# Patient Record
Sex: Male | Born: 1945 | Race: White | Hispanic: No | Marital: Married | Smoking: Former smoker
Health system: Southern US, Community
[De-identification: ages and names within clinical notes are randomized; demographics above are authoritative.]

## PROBLEM LIST (undated history)

## (undated) DIAGNOSIS — E785 Hyperlipidemia, unspecified: Secondary | ICD-10-CM

## (undated) DIAGNOSIS — M6530 Trigger finger, unspecified finger: Secondary | ICD-10-CM

## (undated) DIAGNOSIS — E875 Hyperkalemia: Secondary | ICD-10-CM

## (undated) DIAGNOSIS — M1990 Unspecified osteoarthritis, unspecified site: Secondary | ICD-10-CM

## (undated) DIAGNOSIS — M109 Gout, unspecified: Secondary | ICD-10-CM

## (undated) DIAGNOSIS — N186 End stage renal disease: Secondary | ICD-10-CM

## (undated) DIAGNOSIS — I2510 Atherosclerotic heart disease of native coronary artery without angina pectoris: Secondary | ICD-10-CM

## (undated) DIAGNOSIS — C44222 Squamous cell carcinoma of skin of right ear and external auricular canal: Secondary | ICD-10-CM

## (undated) DIAGNOSIS — Z860100 Personal history of colon polyps, unspecified: Secondary | ICD-10-CM

## (undated) DIAGNOSIS — Z8614 Personal history of Methicillin resistant Staphylococcus aureus infection: Secondary | ICD-10-CM

## (undated) DIAGNOSIS — Z87442 Personal history of urinary calculi: Secondary | ICD-10-CM

## (undated) DIAGNOSIS — D4959 Neoplasm of unspecified behavior of other genitourinary organ: Secondary | ICD-10-CM

## (undated) DIAGNOSIS — I219 Acute myocardial infarction, unspecified: Secondary | ICD-10-CM

## (undated) DIAGNOSIS — T7840XA Allergy, unspecified, initial encounter: Secondary | ICD-10-CM

## (undated) DIAGNOSIS — K2210 Ulcer of esophagus without bleeding: Secondary | ICD-10-CM

## (undated) DIAGNOSIS — I10 Essential (primary) hypertension: Secondary | ICD-10-CM

## (undated) DIAGNOSIS — R0600 Dyspnea, unspecified: Secondary | ICD-10-CM

## (undated) DIAGNOSIS — E119 Type 2 diabetes mellitus without complications: Secondary | ICD-10-CM

## (undated) DIAGNOSIS — Z992 Dependence on renal dialysis: Secondary | ICD-10-CM

## (undated) DIAGNOSIS — N189 Chronic kidney disease, unspecified: Secondary | ICD-10-CM

## (undated) DIAGNOSIS — I739 Peripheral vascular disease, unspecified: Secondary | ICD-10-CM

## (undated) DIAGNOSIS — C439 Malignant melanoma of skin, unspecified: Secondary | ICD-10-CM

## (undated) DIAGNOSIS — K219 Gastro-esophageal reflux disease without esophagitis: Secondary | ICD-10-CM

## (undated) HISTORY — DX: Personal history of colon polyps, unspecified: Z86.0100

## (undated) HISTORY — DX: Ulcer of esophagus without bleeding: K22.10

## (undated) HISTORY — DX: Allergy, unspecified, initial encounter: T78.40XA

## (undated) HISTORY — DX: Personal history of colonic polyps: Z86.010

## (undated) HISTORY — DX: Trigger finger, unspecified finger: M65.30

## (undated) HISTORY — DX: Hyperkalemia: E87.5

## (undated) HISTORY — PX: EYE SURGERY: SHX253

## (undated) HISTORY — DX: Hyperlipidemia, unspecified: E78.5

## (undated) HISTORY — DX: Malignant melanoma of skin, unspecified: C43.9

## (undated) HISTORY — PX: OTHER SURGICAL HISTORY: SHX169

## (undated) HISTORY — PX: CATARACT EXTRACTION: SUR2

## (undated) HISTORY — DX: Type 2 diabetes mellitus without complications: E11.9

## (undated) HISTORY — DX: Squamous cell carcinoma of skin of right ear and external auricular canal: C44.222

## (undated) HISTORY — DX: Gout, unspecified: M10.9

## (undated) HISTORY — PX: RETINAL DETACHMENT SURGERY: SHX105

## (undated) HISTORY — DX: Personal history of Methicillin resistant Staphylococcus aureus infection: Z86.14

## (undated) HISTORY — DX: End stage renal disease: N18.6

## (undated) HISTORY — DX: Atherosclerotic heart disease of native coronary artery without angina pectoris: I25.10

## (undated) HISTORY — DX: Dependence on renal dialysis: Z99.2

## (undated) HISTORY — DX: Essential (primary) hypertension: I10

## (undated) HISTORY — DX: Neoplasm of unspecified behavior of other genitourinary organ: D49.59

## (undated) SURGERY — A/V SHUNT INTERVENTION
Anesthesia: Moderate Sedation | Laterality: Left

---

## 1984-04-18 DIAGNOSIS — I219 Acute myocardial infarction, unspecified: Secondary | ICD-10-CM

## 1984-04-18 HISTORY — DX: Acute myocardial infarction, unspecified: I21.9

## 1994-04-18 HISTORY — PX: CORONARY ARTERY BYPASS GRAFT: SHX141

## 1995-06-09 DIAGNOSIS — I25810 Atherosclerosis of coronary artery bypass graft(s) without angina pectoris: Secondary | ICD-10-CM | POA: Insufficient documentation

## 1995-06-09 DIAGNOSIS — I10 Essential (primary) hypertension: Secondary | ICD-10-CM | POA: Insufficient documentation

## 2006-08-06 DIAGNOSIS — J309 Allergic rhinitis, unspecified: Secondary | ICD-10-CM | POA: Insufficient documentation

## 2006-10-14 ENCOUNTER — Emergency Department: Payer: Self-pay | Admitting: Emergency Medicine

## 2007-02-14 ENCOUNTER — Ambulatory Visit: Payer: Self-pay | Admitting: Specialist

## 2007-10-11 ENCOUNTER — Ambulatory Visit: Payer: Self-pay | Admitting: Family Medicine

## 2008-12-29 ENCOUNTER — Ambulatory Visit: Payer: Self-pay | Admitting: Gastroenterology

## 2009-02-02 ENCOUNTER — Ambulatory Visit: Payer: Self-pay | Admitting: Family Medicine

## 2009-08-26 DIAGNOSIS — Z87891 Personal history of nicotine dependence: Secondary | ICD-10-CM | POA: Insufficient documentation

## 2010-06-22 ENCOUNTER — Emergency Department: Payer: Self-pay | Admitting: Unknown Physician Specialty

## 2010-06-29 ENCOUNTER — Emergency Department: Payer: Self-pay | Admitting: Emergency Medicine

## 2013-01-01 LAB — HEMOGLOBIN A1C: Hgb A1c MFr Bld: 7.5 % — AB (ref 4.0–6.0)

## 2013-01-01 LAB — LIPID PANEL
CHOLESTEROL: 163 mg/dL (ref 0–200)
HDL: 36 mg/dL (ref 35–70)
LDL Cholesterol: 86 mg/dL
Triglycerides: 206 mg/dL — AB (ref 40–160)

## 2013-11-20 LAB — BASIC METABOLIC PANEL
BUN: 14 mg/dL (ref 4–21)
Creatinine: 1.1 mg/dL (ref 0.6–1.3)
Glucose: 146 mg/dL
POTASSIUM: 5.9 mmol/L — AB (ref 3.4–5.3)
Sodium: 141 mmol/L (ref 137–147)

## 2013-11-20 LAB — PSA: PSA: 1.2

## 2013-11-20 LAB — LIPID PANEL
Cholesterol: 192 mg/dL (ref 0–200)
HDL: 44 mg/dL (ref 35–70)
LDL Cholesterol: 98 mg/dL
TRIGLYCERIDES: 251 mg/dL — AB (ref 40–160)

## 2013-11-20 LAB — HEPATIC FUNCTION PANEL: ALT: 28 U/L (ref 10–40)

## 2013-11-20 LAB — HEMOGLOBIN A1C: HEMOGLOBIN A1C: 7 % — AB (ref 4.0–6.0)

## 2014-02-21 LAB — HEMOGLOBIN A1C: HEMOGLOBIN A1C: 7.2 % — AB (ref 4.0–6.0)

## 2014-03-03 ENCOUNTER — Ambulatory Visit: Payer: Self-pay | Admitting: Gastroenterology

## 2014-03-03 LAB — HM COLONOSCOPY

## 2014-04-01 ENCOUNTER — Observation Stay: Payer: Self-pay | Admitting: Internal Medicine

## 2014-04-01 LAB — URINALYSIS, COMPLETE
BACTERIA: NONE SEEN
Bilirubin,UR: NEGATIVE
Blood: NEGATIVE
Glucose,UR: 50 mg/dL (ref 0–75)
KETONE: NEGATIVE
Leukocyte Esterase: NEGATIVE
Nitrite: NEGATIVE
Ph: 6 (ref 4.5–8.0)
SPECIFIC GRAVITY: 1.008 (ref 1.003–1.030)
SQUAMOUS EPITHELIAL: NONE SEEN

## 2014-04-01 LAB — COMPREHENSIVE METABOLIC PANEL
ALK PHOS: 106 U/L
AST: 21 U/L (ref 15–37)
Albumin: 2.9 g/dL — ABNORMAL LOW (ref 3.4–5.0)
Anion Gap: 6 — ABNORMAL LOW (ref 7–16)
BILIRUBIN TOTAL: 0.4 mg/dL (ref 0.2–1.0)
BUN: 19 mg/dL — ABNORMAL HIGH (ref 7–18)
CALCIUM: 10.1 mg/dL (ref 8.5–10.1)
CO2: 24 mmol/L (ref 21–32)
Chloride: 106 mmol/L (ref 98–107)
Creatinine: 1.17 mg/dL (ref 0.60–1.30)
EGFR (Non-African Amer.): >60
GLUCOSE: 123 mg/dL — AB (ref 65–99)
Osmolality: 276 (ref 275–301)
Potassium: 4.5 mmol/L (ref 3.5–5.1)
SGPT (ALT): 30 U/L
SODIUM: 136 mmol/L (ref 136–145)
TOTAL PROTEIN: 6.8 g/dL (ref 6.4–8.2)

## 2014-04-01 LAB — CBC
HCT: 42.9 % (ref 40.0–52.0)
HGB: 14.2 g/dL (ref 13.0–18.0)
MCH: 31.6 pg (ref 26.0–34.0)
MCHC: 33.1 g/dL (ref 32.0–36.0)
MCV: 95 fL (ref 80–100)
Platelet: 388 10*3/uL (ref 150–440)
RBC: 4.5 10*6/uL (ref 4.40–5.90)
RDW: 13 % (ref 11.5–14.5)
WBC: 15.5 10*3/uL — ABNORMAL HIGH (ref 3.8–10.6)

## 2014-04-01 LAB — PROTIME-INR
INR: 1
PROTHROMBIN TIME: 12.8 s (ref 11.5–14.7)

## 2014-04-01 LAB — TROPONIN I: Troponin-I: <0.02 ng/mL

## 2014-04-01 LAB — LIPASE, BLOOD: LIPASE: 170 U/L (ref 73–393)

## 2014-04-02 LAB — CBC WITH DIFFERENTIAL/PLATELET
Basophil #: 0.1 10*3/uL (ref 0.0–0.1)
Basophil %: 0.8 %
Eosinophil #: 0.7 10*3/uL (ref 0.0–0.7)
Eosinophil %: 6.3 %
HCT: 36.2 % — ABNORMAL LOW (ref 40.0–52.0)
HGB: 12 g/dL — ABNORMAL LOW (ref 13.0–18.0)
LYMPHS ABS: 2.9 10*3/uL (ref 1.0–3.6)
LYMPHS PCT: 24.2 %
MCH: 31.6 pg (ref 26.0–34.0)
MCHC: 33.2 g/dL (ref 32.0–36.0)
MCV: 95 fL (ref 80–100)
MONO ABS: 1.2 x10 3/mm — AB (ref 0.2–1.0)
MONOS PCT: 10.1 %
Neutrophil #: 7 10*3/uL — ABNORMAL HIGH (ref 1.4–6.5)
Neutrophil %: 58.6 %
PLATELETS: 315 10*3/uL (ref 150–440)
RBC: 3.8 10*6/uL — ABNORMAL LOW (ref 4.40–5.90)
RDW: 13 % (ref 11.5–14.5)
WBC: 11.9 10*3/uL — AB (ref 3.8–10.6)

## 2014-04-02 LAB — BASIC METABOLIC PANEL
Anion Gap: 3 — ABNORMAL LOW (ref 7–16)
BUN: 18 mg/dL (ref 7–18)
CALCIUM: 8.3 mg/dL — AB (ref 8.5–10.1)
CHLORIDE: 109 mmol/L — AB (ref 98–107)
CO2: 26 mmol/L (ref 21–32)
CREATININE: 1.48 mg/dL — AB (ref 0.60–1.30)
EGFR (African American): >60
EGFR (Non-African Amer.): 50 — ABNORMAL LOW
GLUCOSE: 214 mg/dL — AB (ref 65–99)
OSMOLALITY: 284 (ref 275–301)
Potassium: 4.2 mmol/L (ref 3.5–5.1)
SODIUM: 138 mmol/L (ref 136–145)

## 2014-04-03 LAB — CBC WITH DIFFERENTIAL/PLATELET
BASOS ABS: 0.1 10*3/uL (ref 0.0–0.1)
Basophil %: 0.9 %
EOS PCT: 7.7 %
Eosinophil #: 0.7 10*3/uL (ref 0.0–0.7)
HCT: 36.3 % — AB (ref 40.0–52.0)
HGB: 12 g/dL — ABNORMAL LOW (ref 13.0–18.0)
LYMPHS PCT: 24.4 %
Lymphocyte #: 2.3 10*3/uL (ref 1.0–3.6)
MCH: 31.3 pg (ref 26.0–34.0)
MCHC: 33 g/dL (ref 32.0–36.0)
MCV: 95 fL (ref 80–100)
Monocyte #: 0.9 x10 3/mm (ref 0.2–1.0)
Monocyte %: 9.7 %
NEUTROS ABS: 5.4 10*3/uL (ref 1.4–6.5)
NEUTROS PCT: 57.3 %
Platelet: 271 10*3/uL (ref 150–440)
RBC: 3.81 10*6/uL — ABNORMAL LOW (ref 4.40–5.90)
RDW: 12.9 % (ref 11.5–14.5)
WBC: 9.4 10*3/uL (ref 3.8–10.6)

## 2014-04-03 LAB — BASIC METABOLIC PANEL
Anion Gap: 5 — ABNORMAL LOW (ref 7–16)
BUN: 14 mg/dL (ref 7–18)
CALCIUM: 8.2 mg/dL — AB (ref 8.5–10.1)
CREATININE: 1.13 mg/dL (ref 0.60–1.30)
Chloride: 110 mmol/L — ABNORMAL HIGH (ref 98–107)
Co2: 27 mmol/L (ref 21–32)
EGFR (Non-African Amer.): >60
Glucose: 89 mg/dL (ref 65–99)
Osmolality: 283 (ref 275–301)
POTASSIUM: 4.3 mmol/L (ref 3.5–5.1)
SODIUM: 142 mmol/L (ref 136–145)

## 2014-04-03 LAB — PROTIME-INR
INR: 1
Prothrombin Time: 13.1 secs (ref 11.5–14.7)

## 2014-04-06 LAB — CULTURE, BLOOD (SINGLE)

## 2014-05-06 DIAGNOSIS — L814 Other melanin hyperpigmentation: Secondary | ICD-10-CM | POA: Diagnosis not present

## 2014-05-06 DIAGNOSIS — D485 Neoplasm of uncertain behavior of skin: Secondary | ICD-10-CM | POA: Diagnosis not present

## 2014-05-06 DIAGNOSIS — Z1283 Encounter for screening for malignant neoplasm of skin: Secondary | ICD-10-CM | POA: Diagnosis not present

## 2014-05-06 DIAGNOSIS — Z08 Encounter for follow-up examination after completed treatment for malignant neoplasm: Secondary | ICD-10-CM | POA: Diagnosis not present

## 2014-05-06 DIAGNOSIS — L728 Other follicular cysts of the skin and subcutaneous tissue: Secondary | ICD-10-CM | POA: Diagnosis not present

## 2014-05-06 DIAGNOSIS — L570 Actinic keratosis: Secondary | ICD-10-CM | POA: Diagnosis not present

## 2014-05-06 DIAGNOSIS — Z85828 Personal history of other malignant neoplasm of skin: Secondary | ICD-10-CM | POA: Diagnosis not present

## 2014-05-08 DIAGNOSIS — K259 Gastric ulcer, unspecified as acute or chronic, without hemorrhage or perforation: Secondary | ICD-10-CM | POA: Diagnosis not present

## 2014-05-08 DIAGNOSIS — Z8719 Personal history of other diseases of the digestive system: Secondary | ICD-10-CM | POA: Diagnosis not present

## 2014-05-26 ENCOUNTER — Ambulatory Visit: Payer: Self-pay | Admitting: Gastroenterology

## 2014-05-26 DIAGNOSIS — I2510 Atherosclerotic heart disease of native coronary artery without angina pectoris: Secondary | ICD-10-CM | POA: Diagnosis not present

## 2014-05-26 DIAGNOSIS — K259 Gastric ulcer, unspecified as acute or chronic, without hemorrhage or perforation: Secondary | ICD-10-CM | POA: Diagnosis not present

## 2014-05-26 DIAGNOSIS — K2980 Duodenitis without bleeding: Secondary | ICD-10-CM | POA: Diagnosis not present

## 2014-05-26 DIAGNOSIS — Z79899 Other long term (current) drug therapy: Secondary | ICD-10-CM | POA: Diagnosis not present

## 2014-05-26 DIAGNOSIS — M109 Gout, unspecified: Secondary | ICD-10-CM | POA: Diagnosis not present

## 2014-05-26 DIAGNOSIS — I10 Essential (primary) hypertension: Secondary | ICD-10-CM | POA: Diagnosis not present

## 2014-05-26 DIAGNOSIS — K2270 Barrett's esophagus without dysplasia: Secondary | ICD-10-CM | POA: Diagnosis not present

## 2014-05-26 DIAGNOSIS — I252 Old myocardial infarction: Secondary | ICD-10-CM | POA: Diagnosis not present

## 2014-05-26 DIAGNOSIS — E119 Type 2 diabetes mellitus without complications: Secondary | ICD-10-CM | POA: Diagnosis not present

## 2014-05-26 DIAGNOSIS — K210 Gastro-esophageal reflux disease with esophagitis: Secondary | ICD-10-CM | POA: Diagnosis not present

## 2014-05-26 DIAGNOSIS — R859 Unspecified abnormal finding in specimens from digestive organs and abdominal cavity: Secondary | ICD-10-CM | POA: Diagnosis not present

## 2014-05-26 DIAGNOSIS — Z9841 Cataract extraction status, right eye: Secondary | ICD-10-CM | POA: Diagnosis not present

## 2014-05-26 DIAGNOSIS — Z881 Allergy status to other antibiotic agents status: Secondary | ICD-10-CM | POA: Diagnosis not present

## 2014-05-26 DIAGNOSIS — Z9889 Other specified postprocedural states: Secondary | ICD-10-CM | POA: Diagnosis not present

## 2014-05-26 DIAGNOSIS — Z86010 Personal history of colonic polyps: Secondary | ICD-10-CM | POA: Diagnosis not present

## 2014-05-26 DIAGNOSIS — K224 Dyskinesia of esophagus: Secondary | ICD-10-CM | POA: Diagnosis not present

## 2014-05-26 DIAGNOSIS — Z951 Presence of aortocoronary bypass graft: Secondary | ICD-10-CM | POA: Diagnosis not present

## 2014-05-26 DIAGNOSIS — Z9842 Cataract extraction status, left eye: Secondary | ICD-10-CM | POA: Diagnosis not present

## 2014-05-26 DIAGNOSIS — F17200 Nicotine dependence, unspecified, uncomplicated: Secondary | ICD-10-CM | POA: Diagnosis not present

## 2014-08-09 NOTE — Consult Note (Signed)
Chief Complaint:  Subjective/Chief Complaint seen for melena.  no nausea or emesis, no abdominal pain.  one bm this am, not black like previous.   VITAL SIGNS/ANCILLARY NOTES: **Vital Signs.:   17-Dec-15 06:06  Temperature Temperature (F) 97.8    08:19  Vital Signs Type Pre Medication  Temperature Source oral  Pulse Pulse 50  Respirations Respirations 18  Systolic BP Systolic BP A999333  Diastolic BP (mmHg) Diastolic BP (mmHg) 72  Mean BP 105  Pulse Ox % Pulse Ox % 94  Pulse Ox Activity Level  At rest  Oxygen Delivery Room Air/ 21 %   Brief Assessment:  Cardiac Regular   Respiratory clear BS   Gastrointestinal details normal Soft  Nondistended  No masses palpable  Bowel sounds normal  No rebound tenderness  mild tenderness to palpation in the epigastrum.   Lab Results: Routine Chem:  15-Dec-15 14:50   BUN  19  16-Dec-15 05:01   BUN 18  Routine Coag:  17-Dec-15 08:17   Prothrombin 13.1  INR 1.0 (INR reference interval applies to patients on anticoagulant therapy. A single INR therapeutic range for coumarins is not optimal for all indications; however, the suggested range for most indications is 2.0 - 3.0. Exceptions to the INR Reference Range may include: Prosthetic heart valves, acute myocardial infarction, prevention of myocardial infarction, and combinations of aspirin and anticoagulant. The need for a higher or lower target INR must be assessed individually. Reference: The Pharmacology and Management of the Vitamin K  antagonists: the seventh ACCP Conference on Antithrombotic and Thrombolytic Therapy. [id number] Sept:126 (3suppl): X2190819. A HCT value >55% may artifactually increase the PT.  In one study,  the increase was an average of 25%. Reference:  "Effect on Routine and Special Coagulation Testing Values of Citrate Anticoagulant Adjustment in Patients with High HCT Values." American Journal of Clinical Pathology 2006;126:400-405.)  Routine Hem:  15-Dec-15  14:50   Hemoglobin (CBC) 14.2  Platelet Count (CBC) 388 (Result(s) reported on 01 Apr 2014 at 03:20PM.)  16-Dec-15 05:01   Hemoglobin (CBC)  12.0  Platelet Count (CBC) 315  17-Dec-15 08:17   WBC (CBC) 9.4  RBC (CBC)  3.81  Hemoglobin (CBC)  12.0  Hematocrit (CBC)  36.3  Platelet Count (CBC) 271  MCV 95  MCH 31.3  MCHC 33.0  RDW 12.9  Neutrophil % 57.3  Lymphocyte % 24.4  Monocyte % 9.7  Eosinophil % 7.7  Basophil % 0.9  Neutrophil # 5.4  Lymphocyte # 2.3  Monocyte # 0.9  Eosinophil # 0.7  Basophil # 0.1 (Result(s) reported on 03 Apr 2014 at 08:43AM.)   Radiology Results: CT:    15-Dec-15 20:07, CT Abdomen and Pelvis With Contrast  CT Abdomen and Pelvis With Contrast   REASON FOR EXAM:    (1) abd pain; (2) pel paijn  COMMENTS:       PROCEDURE: CT  - CT ABDOMEN / PELVIS  W  - Apr 01 2014  8:07PM     CLINICAL DATA:  Acute right lower quadrant abdominal pain.    EXAM:  CT ABDOMEN AND PELVIS WITH CONTRAST    TECHNIQUE:  Multidetector CT imaging of the abdomen and pelvis was performed  using the standard protocol following bolus administration of  intravenous contrast.  CONTRAST:  100 mL Omnipaque 300 intravenously.    COMPARISON:  CT scan of February 14, 2007.    FINDINGS:  Visualized lung bases appear normal. No significant osseous  abnormality is noted.    No gallstones  are noted. The liver, spleen and pancreas appear  normal. Adrenal glands appear normal. Nonobstructive calculus is  noted in midpole collecting system of left kidney. Small  nonobstructive calculus is noted in midpole collecting system of  right kidney. 2.4 cm cyst is seen in upper pole of left kidney. No  hydronephrosis or renal obstruction is noted. Atherosclerotic  calcifications of abdominal aorta are noted without aneurysm  formation.    There does appear to be moderate wall thickening involving the the  first and second portions of the duodenum with surrounding  inflammatory fluid  suggesting duodenitis or peptic ulcer disease.  Stable small fat containing ventral hernia is noted and epigastric  region. There is no evidence of bowel obstruction or ileus. The  appendix appears normal. Urinary bladder appears normal. Mild  prostatic enlargement is noted. No significant adenopathyis noted.     IMPRESSION:  Stable small fat containing ventral hernia is seen in epigastric  region.    Bilateral nonobstructive nephrolithiasis is noted.  Moderate wall thickening is seen involving the first and second  portions of the duodenum withsurrounding inflammatory changes  suggesting duodenitis or peptic ulcer disease.      Electronically Signed    By: Sabino Dick M.D.    On: 04/01/2014 20:46         Verified By: Marveen Reeks, M.D.,   Assessment/Plan:  Assessment/Plan:  Assessment 1) melena-not recurrent, some epigastric discomfort much improved from admission. Positive h/o frequent nsaid use. 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 Benign ureteral  tumors, status post resection, diabetes, hypertension.   Plan 1) egd today.  I have discussed the risks benefits and complications of egd to include not limited to bleeding infection perforation and sedation and he wishes to proceed.   furhter recs to follow.   Electronic Signatures: Loistine Simas (MD)  (Signed 17-Dec-15 13:04)  Authored: Chief Complaint, VITAL SIGNS/ANCILLARY NOTES, Brief Assessment, Lab Results, Radiology Results, Assessment/Plan   Last Updated: 17-Dec-15 13:04 by Loistine Simas (MD)

## 2014-08-09 NOTE — H&P (Signed)
PATIENT NAME:  Thomas [name], Thomas [name] MR#:  [id number] DATE OF BIRTH:  [birth year]  DATE OF ADMISSION:  04/01/2014  PRIMARY CARE PHYSICIAN: Kirstie Peri. Caryn Section, MD   REQUESTING PHYSICIAN: Sheryl L. Benjaman Lobe, MD    CHIEF COMPLAINT: Abdominal pain.  HISTORY OF PRESENT ILLNESS: The patient is a 69 year old male with a known history of MI with coronary disease status post CABG about 18 years ago, hypertension, is being admitted for melena. Patient noticed solid black stool this morning He started feeling some lower abdominal squeezing since yesterday, which would come and go, it was more of a nagging pain about 5/10 but when it does come it would shoot in the back and was very uncomfortable. The pain would be about 5/10 in severity. He was feeling a little bit weak today and decided to come to the Emergency Department. While in the ED, he underwent CT scan of the abdomen and pelvis, which showed possible acute duodenitis/peptic ulcer disease and is being admitted for further evaluation and management.   The patient seems pretty comfortable right now but does seem get frequent shooting pain in the back from the lower abdomen. He also reports some pain around the epigastric region.   PAST MEDICAL HISTORY:  1.  Coronary artery disease, status post CABG about 18 years ago.  2.  Ureteral tumor, status post resection x 2.  3.  Diabetes mellitus.  4.  Hypertension.   ALLERGIES: TO CIPROFLOXACIN.   SOCIAL HISTORY: He smokes about 1 pack of cigarettes daily for last 50 years. Occasional alcohol. He used to be a Secondary school teacher.   FAMILY HISTORY: Positive for heart disease in the father. Mother had Alzheimer disease.   MEDICATIONS AT HOME:  1.  Aspirin 81 mg p.o. daily.  2.  Atenolol 25 mg p.o. daily.  3.  Atorvastatin 20 mg p.o. at bedtime.  4.  Glipizide 5 mg p.o. daily.  5.  HCTZ/lisinopril 25/20 mg 1 tablet p.o. daily.  6.  Metformin 500 mg p.o. b.i.d. daily.   REVIEW OF SYSTEMS:  CONSTITUTIONAL: No fever.  Positive fatigue and weakness.  EYES: No blurred or double vision.  EARS, NOSE, THROAT: No tinnitus or ear pain.  RESPIRATORY: No cough, wheezing, hemoptysis.  CARDIOVASCULAR: No chest pain, orthopnea, edema.  GASTROINTESTINAL: No nausea, vomiting, or diarrhea. Positive for abdominal pain and melena.  GENITOURINARY: No dysuria or hematuria.  ENDOCRINE: No polyuria or nocturia.   HEMATOLOGY: Positive for anemia. No easy bruising or bleeding.  SKIN: No rash or lesions.  MUSCULOSKELETAL: No arthritis or muscle cramp.  NEUROLOGIC: No tingling, numbness, weakness.  PSYCHIATRIC: No history of anxiety or depression.   PHYSICAL EXAMINATION:  VITAL SIGNS: Temperature 98.2, heart rate 66 per minute, respirations 20 per minute, blood pressure 155/69. He is saturating 95% on room air.  GENERAL: The patient is a 69 year old male lying in the bed comfortably without any acute distress.  EYES: Pupils equal, round, and reactive to light and accommodation. No scleral icterus. Extraocular muscles intact.  HEENT: Head atraumatic, normocephalic. Oropharynx and nasopharynx clear.  NECK: Supple. No jugular venous distention. No thyroid enlargement. LUNGS: Clear auscultation bilaterally. No wheezing, rales, rhonchi, or crepitation.  CARDIOVASCULAR: S1, S2 normal. No murmurs.  ABDOMEN: Soft. He does have some epigastric tenderness and minimal discomfort in the lower abdominal region on palpation. No guarding or rigidity. No organomegaly appreciated. Bowel sounds present.   NEUROLOGIC: Cranial nerves II-XII intact. Muscle strength 5/5 in all extremities. Sensation intact.  PSYCHIATRIC: The patient is alert and oriented  x 3.  SKIN: No obvious rash, lesion, ulcer.  MUSCULOSKELETAL: No joint effusion or tenderness.   LABORATORY DATA: Normal BMP. Normal liver function tests. Normal first set of troponin. CBC within normal limits except white count of 15.5. Normal coagulation panel. Urinalysis is negative.    IMAGING: CT scan of the abdomen and pelvis with contrast in the ER showed bilateral nonobstructive nephrolithiasis. Ventral hernia containing small fat in epigastric region. Possible duodenitis or peptic ulcer disease.   IMPRESSION AND PLAN:  1.  Melena: Likely due to acute duodenitis/peptic ulcer disease. We will start him on intravenous Protonix at this time twice a day and consult gastroenterology. He may need luminal evaluation. At this time is hemodynamically stable. We will hold aspirin. Add Bentyl for his bowel problems. Hold off narcotics unless really needed. 2.  Diabetes mellitus: He reports his recent hemoglobin A1c which was checked and was 7. We will hold his metformin. Continue glipizide. Start him on sliding scale insulin. 3.  Coronary disease:  Hold his aspirin. Continue rest of cardiac medication.  4.  Hyperlipidemia: We will continue statin.   CODE STATUS: Full code.   TOTAL TIME TAKING CARE OF THIS PATIENT: 40 minutes.    ____________________________ Lucina Mellow. Manuella Ghazi, MD vss:bm D: 04/01/2014 22:06:51 ET T: 04/01/2014 23:12:29 ET JOB#: [id number]  cc: Zriyah Kopplin S. Manuella Ghazi, MD, <Dictator> Kirstie Peri. Caryn Section, MD Remer Macho MD ELECTRONICALLY SIGNED 04/05/2014 17:08

## 2014-08-09 NOTE — Consult Note (Signed)
PATIENT NAME:  Thomas [name], Thomas [name] MR#:  [id number] DATE OF BIRTH:  08/28/1945  DATE OF CONSULTATION:  04/01/2014  REQUESTING PHYSICIAN:  Vipul S. Manuella Ghazi, MD CONSULTING PHYSICIAN:  Theodore Demark, NP  REASON FOR CONSULTATION: GI consult ordered by Dr. Manuella Ghazi for evaluation of melena.   HISTORY OF PRESENT ILLNESS: I appreciate consult for this 69 year old Caucasian man admitted with abdominal pain for evaluation of melena. Reports onset of generalized abdominal pain described as an achiness on Monday. Went to work back the next day; however, that day the pain changed. It became sharp, radiating in waves from the left upper quadrant around to the other side and to the back. States he had a large, black tarry stool yesterday, none since. Rarely uses NSAIDs and has not had any problem with her GERD-like symptoms he reports. No history of EGD. Had colonoscopy with Dr. Allen Norris last month with the removal of 5 polyps. Denies all further GI-related complaints. Hemoglobin last was 12, normal platelets, normal white blood cell count, PT-INR normal. He is hemodynamically stable. He had a CT scan with some duodenitis versus peptic ulcer disease, some nonobstructing nephrolithiasis. He is on b.i.d. pantoprazole. He had a small regular breakfast this morning.   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 Benign ureteral  tumors, status post resection, diabetes, hypertension.   ALLERGIES: CIPROFLOXACIN.   SOCIAL HISTORY: Smokes a pack a day for 50 years. Occasional alcohol.   FAMILY HISTORY: Brother had pancreatitis and alcoholic liver disease. Otherwise, no family history of colorectal cancer, colon polyps, ulcers. Significant for heart disease in his father. Mother with dementia.   HOME MEDICATIONS: ASA 81 mg p.o. daily, atenolol 20 mg p.o. daily, atorvastatin 20 mg p.o. daily, glipizide 5 mg p.o. daily, hydrochlorothiazide, lisinopril 25/20 mg p.o. daily, metformin 500 mg p.o. daily.    REVIEW OF SYSTEMS: Ten systems reviewed, unremarkable other than what is noted above.   VITAL SIGNS: Most recent, temperature 98.4, pulse 51, respiratory rate 20, blood pressure 156/72, SaO2 of 91% on room air.   LABORATORY DATA: Most recent, glucose 214, BUN 18, creatinine 1.48, sodium 138, potassium 4.2, GFR 50, calcium 8.3, lipase 170. Liver panel normal except decreased albumin at 2.9, troponin 0.02. WBC 11.9, hemoglobin 12, hematocrit 36.2, platelet count 315,000. PT 12.8, INR 1.0. Blood cultures negative.  IMAGING: CT with some duodenitis, a ventral abdominal hernia, and kidney stones.   PHYSICAL EXAMINATION:  GENERAL: Pleasant, well-appearing man in bed in no acute distress.  HEENT: Normocephalic, atraumatic. Conjunctivae pink. Sclerae are clear.  NECK: Supple. No thyromegaly or lymphadenopathy.  CHEST: Respirations eupneic. Lungs clear.  CARDIAC: S1, S2, RRR. No MRG. No appreciable edema.  ABDOMEN: Bowel sounds x 4, nondistended, nontender, soft. No guarding, rigidity, peritoneal signs, or other abnormalities.  SKIN: Warm, dry, pink. No erythema, lesion, or rash.  MUSCULOSKELETAL: MAEW x 4. Strength 5/5. Sensation intact.  NEUROLOGICAL: Alert, oriented x 3. Cranial nerves II through XII intact. Speech clear. No facial droop.  PSYCHIATRIC: Pleasant, calm, cooperative, logical train of thought.   IMPRESSION AND PLAN: Abdominal pain, melena, abnormal duodenal appearance on CT. Agree with following hemoglobin, b.i.d. proton pump inhibitor. Would transfuse p.r.n. I do recommend esophagogastroduodenoscopy if clinically feasible. Indications, risks, benefits including, but not limited to, bleeding, infection perforation, difficulty with sedation were discussed with the patient. He is agreeable to the procedure. Will discuss with Dr. Gustavo Lah further.   Thank you very much for this consult. These services were provided by  Stephens November, MSN, [facility], in collaboration with Loistine Simas,  MD, with whom I have discussed this patient in full.    ____________________________ Theodore Demark, NP chl:LT D: 04/02/2014 17:26:00 ET T: 04/02/2014 17:59:00 ET JOB#: [id number]  cc: Theodore Demark, NP, <Dictator> IXL SIGNED 04/04/2014 17:18

## 2014-08-09 NOTE — Consult Note (Signed)
Chief Complaint:  Subjective/Chief Complaint Please see full GI consult and brief consult note.  Patietn admitted with epigastric pain and episode of black stool yesterday.  No repeat of bm since then.  abdominal pain much improved.  Abdominal ct showing duodenitis, possible DU.  Patient taking naprosyn/aleve at home relatively regularly. Was given a regular diet earliet today.    Will change diet ot clears for now, plann egd for tomorrow, sooner if clinically indicated.  Patietn is hemodynamically stable.  I have discussed the risks benefits and complications of egd to include not limited to bleeding infection perforation and sedation and he wishes to proceed. continue iv ppi bid as you are currently.  Further recs to follow.   VITAL SIGNS/ANCILLARY NOTES: **Vital Signs.:   16-Dec-15 13:20  Vital Signs Type Routine  Temperature Temperature (F) 98.4  Celsius 36.8  Temperature Source oral  Pulse Pulse 51  Respirations Respirations 20  Systolic BP Systolic BP A999333  Diastolic BP (mmHg) Diastolic BP (mmHg) 72  Mean BP 100  Pulse Ox % Pulse Ox % 91  Pulse Ox Activity Level  At rest  Oxygen Delivery Room Air/ 21 %   Brief Assessment:  GEN well developed   Cardiac Regular   Respiratory clear BS   Gastrointestinal details normal Soft  Nontender  Nondistended  Bowel sounds normal   Lab Results:  Routine Chem:  15-Dec-15 14:50   BUN  19  16-Dec-15 05:01   BUN 18  Routine Hem:  15-Dec-15 14:50   Hemoglobin (CBC) 14.2  Platelet Count (CBC) 388 (Result(s) reported on 01 Apr 2014 at 03:20PM.)  16-Dec-15 05:01   Hemoglobin (CBC)  12.0  Platelet Count (CBC) 315   Radiology Results:  CT:    15-Dec-15 20:07, CT Abdomen and Pelvis With Contrast  CT Abdomen and Pelvis With Contrast   REASON FOR EXAM:    (1) abd pain; (2) pel paijn  COMMENTS:       PROCEDURE: CT  - CT ABDOMEN / PELVIS  W  - Apr 01 2014  8:07PM     CLINICAL DATA:  Acute right lower quadrant abdominal  pain.    EXAM:  CT ABDOMEN AND PELVIS WITH CONTRAST    TECHNIQUE:  Multidetector CT imaging of the abdomen and pelvis was performed  using the standard protocol following bolus administration of  intravenous contrast.  CONTRAST:  100 mL Omnipaque 300 intravenously.    COMPARISON:  CT scan of February 14, 2007.    FINDINGS:  Visualized lung bases appear normal. No significant osseous  abnormality is noted.    No gallstones are noted. The liver, spleen and pancreas appear  normal. Adrenal glands appear normal. Nonobstructive calculus is  noted in midpole collecting system of left kidney. Small  nonobstructive calculus is noted in midpole collecting system of  right kidney. 2.4 cm cyst is seen in upper pole of left kidney. No  hydronephrosis or renal obstruction is noted. Atherosclerotic  calcifications of abdominal aorta are noted without aneurysm  formation.    There does appear to be moderate wall thickening involving the the  first and second portions of the duodenum with surrounding  inflammatory fluid suggesting duodenitis or peptic ulcer disease.  Stable small fat containing ventral hernia is noted and epigastric  region. There is no evidence of bowel obstruction or ileus. The  appendix appears normal. Urinary bladder appears normal. Mild  prostatic enlargement is noted. No significant adenopathyis noted.     IMPRESSION:  Stable small fat  containing ventral hernia is seen in epigastric  region.    Bilateral nonobstructive nephrolithiasis is noted.  Moderate wall thickening is seen involving the first and second  portions of the duodenum withsurrounding inflammatory changes  suggesting duodenitis or peptic ulcer disease.      Electronically Signed    By: Sabino Dick M.D.    On: 04/01/2014 20:46         Verified By: Marveen Reeks, M.D.,   Assessment/Plan:  Assessment/Plan:  Assessment as above.   Electronic Signatures: Loistine Simas (MD)  (Signed  16-Dec-15 20:42)  Authored: Chief Complaint, VITAL SIGNS/ANCILLARY NOTES, Brief Assessment, Lab Results, Radiology Results, Assessment/Plan   Last Updated: 16-Dec-15 20:42 by Loistine Simas (MD)

## 2014-08-09 NOTE — Consult Note (Signed)
Brief Consult Note: Diagnosis: abdominal pain.   Patient was seen by consultant.   Consult note dictated.   Comments: Appreciate consult for 69 y/o caucsian man admitted with abdominal pain for eval of melena. Reports onset of generalized abdominal pain described as an achiness on Mon- went to work & back the next day, however pain changed & became sharp- radiated in waves from the LUQ around to the otherside & to the back. States he had a large black tarry stool yesterday. None since.  States he rarely uses NSAIDs and hasn't had any problems wtih GERD-like sx. No history of EGD. Had colonoscopy last month with 5 polyps removed by Dr Allen Norris. denies further GI complaints. Hgb last 12, with normal platelets & wbc.pt/inr normal. hemodynamically stable.  CT with duodenitis v. PUD and some nonobstructing nephrolithiasis. On bid Pantoprazole. Had a small regular breakfast this am Impresion and plan. Abdominal pain, melena, abnormal duodenal appearance on CT. Agree with following hgb, bid PPI. Would transfuse prn. Do recommend EGD as clinically feasible. Inidcations, benefits, risks- including but not limited to bleeding, infection, perforation, difficulty with sedation, were discussed with the patient and he is agreeable to the procedure. Will d/w Dr Gustavo Lah further.  Electronic Signatures: Stephens November H (NP)  (Signed 16-Dec-15 15:09)  Authored: Brief Consult Note   Last Updated: 16-Dec-15 15:09 by Theodore Demark (NP)

## 2014-08-11 LAB — SURGICAL PATHOLOGY

## 2014-08-13 NOTE — Discharge Summary (Signed)
PATIENT NAME:  Thomas [name], Thomas [name] MR#:  [id number] DATE OF BIRTH:  [birth year]  DATE OF ADMISSION:  04/01/2014 DATE OF DISCHARGE:  04/04/2014  ADMITTING DIAGNOSIS: Melena.   DISCHARGE DIAGNOSES:  1.  Melena, status post esophagogastroduodenoscopy on 04/03/2014. LA grade B erosive esophagitis was noted, esophageal motility disorder, erosive gastritis and duodenitis.  2.  Status post hemorrhagic anemia.  3.  Leukocytosis.  4.  Diabetes mellitus.  5.  Hypertension.   DISCHARGE CONDITION: Stable.   DISCHARGE MEDICATIONS: The patient is to continue atenolol 25 mg p.o. daily, hydrochlorothiazide with lisinopril 25/20 one tablet daily, glipizide 5 mg p.o. daily, metformin 500 mg p.o. twice daily, atorvastatin unknown dose once daily, dicyclomine 10 mg every 8 hours ago, sucralfate 1 gram 4 times daily, pantoprazole 40 mg twice daily, iron gluconate 240 mg twice daily. The patient was advised not to take aspirin.   HOME OXYGEN: None.   DIET: Two gram salt, low fat, low cholesterol, mechanical soft. The patient was advised to continue soft, liquidy diet for the first 1 week.   ACTIVITY LIMITATIONS: As tolerated.   FOLLOWUP APPOINTMENTS:  With Dr. Caryn Section in 2 days after discharge, Dr. Gustavo Lah in 1 month after discharge.   CONSULTANTS: Care management, social work, Dr. Gustavo Lah, nurse practitioner, [facility].   RADIOLOGIC STUDIES: CT scan of abdomen and pelvis with contrast 04/01/2014 revealing stable small fat-containing ventral hernia seen in the epigastric region, bilateral nonobstructive nephrolithiasis noted, moderate wall thickening was seen involving the first and second portions of duodenum with surrounding inflammatory changes suggesting duodenitis or peptic ulcer disease.   HISTORY OF PRESENT ILLNESS: This 69 year old male with past medical history significant for history of diabetes mellitus, history of coronary artery disease, coronary artery bypass grafting who was on aspirin  therapy at home presented to the hospital with complaints of black-looking stool. Please refer to Dr. Trena Platt admission note on 04/01/2014. On arrival to the hospital, the patient's temperature was 98.2, pulse was 66, respiration rate was 20, blood pressure 155/69, saturation was 95% on room air. Physical exam was unremarkable. The patient's lab data done on arrival to Emergency Room revealed mild elevation of BUN of 19, glucose was 123, otherwise, BMP was unremarkable. The patient's lipase level was normal at 170. Liver enzymes: Albumin level was 2.9, otherwise, unremarkable. Troponin was less than 0.02. White blood cell count was elevated to 15.5, hemoglobin was 14.2, platelet count was 388. Antibody screen was negative, pro time was 12.8 and INR was 1.0. Blood cultures taken on 04/01/2014 showed no growth. Urinalysis was unremarkable. The patient was admitted to the hospital for further evaluation.  He underwent CT scan of his abdomen and was initiated on   DICTATION ENDS HERE    ____________________________ Theodoro Grist, MD rv:AT D: 04/04/2014 18:36:04 ET T: 04/04/2014 23:30:41 ET JOB#: [id number]  cc: Theodoro Grist, MD, <Dictator> Kirstie Peri. Caryn Section, MD Lollie Sails, MD Davide Risdon Ether Griffins MD ELECTRONICALLY SIGNED 04/21/2014 21:17

## 2014-08-13 NOTE — Discharge Summary (Signed)
PATIENT NAME:  Thomas [name], Thomas [name] MR#:  [id number] DATE OF BIRTH:  Feb 20, 1946  DATE OF ADMISSION:  04/01/2014 DATE OF DISCHARGE:  04/04/2014  ADDENDUM      Unfortunately my dictation was cutoff short so I am going to continue.   The patient was admitted to the hospital for further evaluation. He was started on a proton pump inhibitor IV and consultation with gastroenterologist was obtained. Gastroenterologist saw patient in consultation and recommended EGD. EGD was performed on 04/03/2014 and revealed as mentioned above Z line irregularity consistent with LA grade B erosive esophagitis, esophageal dysmotility disorder, erosive gastritis, which was biopsied, and erosive duodenitis. Dr. Gustavo Lah recommended to use Protonix 40 mg twice daily dose for 6 weeks, also after that to use Protonix at 40 mg once daily dose, use sucralfate tablets 1 gram 4 times daily for 1 month and then twice daily and return in GI clinic in approximately 1 month, to repeat upper endoscopy in 7 weeks to check healing.   The patient was initiated on a clear-liquid diet on the 04/03/2014, which was advanced to full liquid diet and soft diet on 04/04/2014. He was able to tolerate diet well, and he is going to be discharged home today. On the day of discharge, temperature is 98.3, pulse 50, respiratory rate was 18, blood pressure 146/72, saturation was 94% to 95% on room air at rest. The patient is to follow up with Dr. Gustavo Lah in the next 1 month after discharge.   The patient was followed in regards to a bleeding. His hemoglobin was followed while he was in the hospital. As mentioned above, his hemoglobin level was 14.2 on the day of admission. It drifted down to 12.0 on 16th of December and remained stable until his day of discharge. The patient was advised to continue iron supplementation and follow his hemoglobin level as outpatient. No transfusions were recommended.   In regards to leukocytosis, the patient's white blood cell  count was elevated on the day of admission to 15.5: however, it improved to 9.4 by 04/03/2014. It was unclear why the patient had elevated white blood cell count but it was felt it was most likely stress related demarcation. In regards to history of diabetes mellitus and hypertension, the patient is to continue his outpatient management. No changes were made. He is being discharged in stable condition with the above-mentioned medications and followup.   TIME SPENT: 40 minutes   ____________________________ Theodoro Grist, MD rv:AT D: 04/04/2014 18:40:04 ET T: 04/05/2014 00:20:42 ET JOB#: [id number]  cc: Theodoro Grist, MD, <Dictator> Tanae Petrosky MD ELECTRONICALLY SIGNED 04/21/2014 21:24

## 2014-09-26 ENCOUNTER — Other Ambulatory Visit: Payer: Self-pay | Admitting: *Deleted

## 2014-09-26 ENCOUNTER — Telehealth: Payer: Self-pay | Admitting: Family Medicine

## 2014-09-26 ENCOUNTER — Other Ambulatory Visit: Payer: Self-pay | Admitting: Family Medicine

## 2014-09-26 DIAGNOSIS — E1139 Type 2 diabetes mellitus with other diabetic ophthalmic complication: Secondary | ICD-10-CM | POA: Insufficient documentation

## 2014-09-26 DIAGNOSIS — E1129 Type 2 diabetes mellitus with other diabetic kidney complication: Secondary | ICD-10-CM

## 2014-09-26 DIAGNOSIS — E119 Type 2 diabetes mellitus without complications: Secondary | ICD-10-CM

## 2014-09-26 MED ORDER — METFORMIN HCL 500 MG PO TABS: 1000.0000 mg | ORAL_TABLET | Freq: Every day | ORAL | Status: DC

## 2014-09-26 MED ORDER — METFORMIN HCL ER 500 MG PO TB24: 1000.0000 mg | ORAL_TABLET | Freq: Every day | ORAL | Status: DC

## 2014-09-26 NOTE — Telephone Encounter (Signed)
Thomas [name] with [facility] called to verify the metFORMIN (GLUCOPHAGE) 500 MG tablet.  Pt has been taking the Metformin ER.  Please verify change.  [id number]

## 2014-09-26 NOTE — Telephone Encounter (Signed)
Refill request for Metformin 500 mg Last filled by MD on- 10/04/2013 #60 x10 Last Appt: 04/15/2014 Next Appt: none Please advise refill?

## 2014-09-26 NOTE — Telephone Encounter (Signed)
Per Sherri please send electronically.    Thanks,   -Mickel Baas

## 2014-11-24 ENCOUNTER — Ambulatory Visit (INDEPENDENT_AMBULATORY_CARE_PROVIDER_SITE_OTHER): Payer: Medicare Other | Admitting: Family Medicine

## 2014-11-24 ENCOUNTER — Encounter: Payer: Self-pay | Admitting: *Deleted

## 2014-11-24 ENCOUNTER — Encounter: Payer: Self-pay | Admitting: Family Medicine

## 2014-11-24 VITALS — BP 120/80 | HR 56 | Temp 98.1°F | Resp 16 | Ht 66.0 in | Wt 163.0 lb

## 2014-11-24 DIAGNOSIS — Z87891 Personal history of nicotine dependence: Secondary | ICD-10-CM

## 2014-11-24 DIAGNOSIS — Z0000 Encounter for general adult medical examination without abnormal findings: Secondary | ICD-10-CM | POA: Diagnosis not present

## 2014-11-24 DIAGNOSIS — I10 Essential (primary) hypertension: Secondary | ICD-10-CM

## 2014-11-24 DIAGNOSIS — E875 Hyperkalemia: Secondary | ICD-10-CM | POA: Insufficient documentation

## 2014-11-24 DIAGNOSIS — C439 Malignant melanoma of skin, unspecified: Secondary | ICD-10-CM | POA: Insufficient documentation

## 2014-11-24 DIAGNOSIS — M6530 Trigger finger, unspecified finger: Secondary | ICD-10-CM | POA: Insufficient documentation

## 2014-11-24 DIAGNOSIS — IMO0002 Reserved for concepts with insufficient information to code with codable children: Secondary | ICD-10-CM | POA: Insufficient documentation

## 2014-11-24 DIAGNOSIS — Z8614 Personal history of Methicillin resistant Staphylococcus aureus infection: Secondary | ICD-10-CM | POA: Insufficient documentation

## 2014-11-24 DIAGNOSIS — Z125 Encounter for screening for malignant neoplasm of prostate: Secondary | ICD-10-CM

## 2014-11-24 DIAGNOSIS — E119 Type 2 diabetes mellitus without complications: Secondary | ICD-10-CM | POA: Diagnosis not present

## 2014-11-24 DIAGNOSIS — K2210 Ulcer of esophagus without bleeding: Secondary | ICD-10-CM | POA: Insufficient documentation

## 2014-11-24 DIAGNOSIS — K2980 Duodenitis without bleeding: Secondary | ICD-10-CM | POA: Insufficient documentation

## 2014-11-24 DIAGNOSIS — I2510 Atherosclerotic heart disease of native coronary artery without angina pectoris: Secondary | ICD-10-CM

## 2014-11-24 DIAGNOSIS — IMO0001 Reserved for inherently not codable concepts without codable children: Secondary | ICD-10-CM | POA: Insufficient documentation

## 2014-11-24 DIAGNOSIS — Z85820 Personal history of malignant melanoma of skin: Secondary | ICD-10-CM | POA: Insufficient documentation

## 2014-11-24 NOTE — Progress Notes (Signed)
Patient: Thomas [name], Male    DOB: 12/15/1945, 69 y.o.   MRN: [id number] Visit Date: 11/24/2014  Today's Provider: Lelon Huh, MD   Chief Complaint  Patient presents with  . Medicare Wellness   Subjective:    Annual wellness visit Thomas [name] is a 69 y.o. male. He feels well. He reports exercising every day. He reports he is sleeping well.  -----------------------------------------------------------  Diabetes Mellitus Type II, Follow-up:   Lab Results  Component Value Date   HGBA1C 7.2* 02/21/2014   HGBA1C 7.0* 11/20/2013   HGBA1C 7.5* 01/01/2013   Last seen for diabetes 9 months ago.  Management changes included none. He reports good compliance with treatment. He is not having side effects.   Home blood sugar records: fasting range: 90s-120s and postprandial range: 180s-200  Episodes of hypoglycemia? no   Current Insulin Regimen: n/a Most Recent Eye Exam: within the last year Weight trend: stable Prior visit with dietician: no Current diet: well balanced Current exercise: walking  ------------------------------------------------------------------------   Hypertension, follow-up:  BP Readings from Last 3 Encounters:  11/24/14 120/80  04/15/14 144/66    He was last seen for hypertension 9 months ago.  BP at that visit was 164/76. Management changes since that visit include none .He reports good compliance with treatment. He is not having side effects. none  He is exercising. He is adherent to low salt diet.   Outside blood pressures are none. He is experiencing none.  Patient denies none.   Cardiovascular risk factors include diabetes mellitus.  Use of agents associated with hypertension: none.   ------------------------------------------------------------------------    Lipid/Cholesterol, Follow-up:   Last seen for this 9 months ago.  Management changes since that visit include none.  Last Lipid Panel:    Component Value  Date/Time   CHOL 192 11/20/2013   TRIG 251* 11/20/2013   HDL 44 11/20/2013   LDLCALC 98 11/20/2013    He reports good compliance with treatment. He is not having side effects. none  Wt Readings from Last 3 Encounters:  11/24/14 163 lb (73.936 kg)  04/15/14 165 lb (74.844 kg)    ------------------------------------------------------------------------    Review of Systems  Constitutional: Negative.   HENT: Positive for congestion, nosebleeds, postnasal drip and sinus pressure. Negative for dental problem, drooling, ear discharge, ear pain, facial swelling, hearing loss, mouth sores, rhinorrhea, sneezing, sore throat, tinnitus, trouble swallowing and voice change.   Eyes: Negative.   Respiratory: Negative.   Cardiovascular: Negative.   Gastrointestinal: Negative.   Endocrine: Negative.   Genitourinary: Negative.   Musculoskeletal: Negative.   Skin: Negative.   Allergic/Immunologic: Negative.   Neurological: Negative.   Hematological: Negative.   Psychiatric/Behavioral: Negative.     History   Social History  . Marital Status: Married    Spouse Name: N/A  . Number of Children: 1  . Years of Education: N/A   Occupational History  . Retired/Part-Time     [place] History Main Topics  . Smoking status: Former Research scientist (life sciences)  . Smokeless tobacco: Not on file  . Alcohol Use: 0.0 oz/week    0 Standard drinks or equivalent per week  . Drug Use: No  . Sexual Activity: Not on file   Other Topics Concern  . Not on file   Social History Narrative    Patient Active Problem List   Diagnosis Date Noted  . Duodenitis 11/24/2014  . Erosive esophagitis 11/24/2014  . Personal history of  methicillin resistant Staphylococcus aureus 11/24/2014  . High potassium 11/24/2014  . Melanoma of skin 11/24/2014  . Altered blood in stool 11/24/2014  . Acquired trigger finger 11/24/2014  . 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 Diabetes 09/26/2014  . History  of tobacco use 08/26/2009  . Allergic rhinitis 08/06/2006  . 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 History of colon polyps 05/09/2003  . Arteriosclerosis of coronary artery 06/09/1995  . Essential (primary) hypertension 06/09/1995  . Combined fat and carbohydrate induced hyperlipemia 06/09/1995    Past Surgical History  Procedure Laterality Date  . Coronary artery bypass graft  1996  . Cataract extraction    . Excision ureteral tumor      His family history includes Alzheimer's disease in his brother and mother; Lung cancer in his paternal grandfather.    Previous Medications   ATENOLOL (TENORMIN) 25 MG TABLET    Take 25 mg by mouth daily.    ATORVASTATIN (LIPITOR) 80 MG TABLET    Take 80 mg by mouth daily.    COLCHICINE 0.6 MG TABLET    Take 0.6 tablets by mouth as needed. Take 0.6 mg by mouth 2 (two) times daily as needed. Take 2 tablets (1.2mg ) by mouth at first sign of gout flare followed by 1 tablet (0.6mg ) after 1 hour. (Max 1.8mg  within 1 hour)  0.6   DICYCLOMINE (BENTYL) 10 MG CAPSULE    Take 10 mg by mouth every 8 (eight) hours.    FERROUS GLUCONATE (FERGON) 240 (27 FE) MG TABLET    Take 240 mg by mouth 2 (two) times daily.    GLIPIZIDE (GLUCOTROL XL) 5 MG 24 HR TABLET    Take 5 mg by mouth daily with breakfast.    GLUCOSE BLOOD TEST STRIP       LISINOPRIL-HYDROCHLOROTHIAZIDE (PRINZIDE,ZESTORETIC) 20-12.5 MG PER TABLET    Take 1 tablet by mouth daily.    METFORMIN (GLUCOPHAGE-XR) 500 MG 24 HR TABLET    Take 2 tablets (1,000 mg total) by mouth daily.   PANTOPRAZOLE (PROTONIX) 40 MG TABLET    Take 40 mg by mouth daily.    SUCRALFATE (CARAFATE) 1 G TABLET    Take 1 g by mouth 4 (four) times daily.     Patient Care Team: Birdie Sons, MD as PCP - General (Family Medicine)     Objective:   Vitals: BP 120/80 mmHg  Pulse 56  Temp(Src) 98.1 F (36.7 C) (Oral)  Resp 16  Ht 5\' 6"  (1.676 m)  Wt 163 lb (73.936 kg)  BMI 26.32 kg/m2  SpO2 98%  Physical Exam  General  Appearance:    Alert, cooperative, no distress, appears stated age  Head:    Normocephalic, without obvious abnormality, atraumatic  Eyes:    PERRL, conjunctiva/corneas clear, EOM's intact, fundi    benign, both eyes       Ears:    Normal TM's and external ear canals, both ears  Nose:   Nares normal, septum midline, mucosa normal, no drainage   or sinus tenderness  Throat:   Lips, mucosa, and tongue normal; teeth and gums normal  Neck:   Supple, symmetrical, trachea midline, no adenopathy;       thyroid:  No enlargement/tenderness/nodules; no carotid   bruit or JVD  Back:     Symmetric, no curvature, ROM normal, no CVA tenderness  Lungs:     Clear to auscultation bilaterally, respirations unlabored  Chest wall:    No tenderness or deformity  Heart:  Regular rate and rhythm, S1 and S2 normal, no murmur, rub   or gallop  Abdomen:     Soft, non-tender, bowel sounds active all four quadrants,    no masses, no organomegaly  Genitalia:    n/a  Rectal:    not indicated  Extremities:   Extremities normal, atraumatic, no cyanosis or edema  Pulses:   2+ and symmetric all extremities  Skin:   Skin color, texture, turgor normal, no rashes or lesions  Lymph nodes:   Cervical, supraclavicular, and axillary nodes normal  Neurologic:   CNII-XII intact. Normal strength, sensation and reflexes      throughout    Activities of Daily Living In your present state of health, do you have any difficulty performing the following activities: 11/24/2014  Hearing? N  Vision? N  Difficulty concentrating or making decisions? N  Walking or climbing stairs? N  Dressing or bathing? N  Doing errands, shopping? N    Fall Risk Assessment Fall Risk  11/24/2014  Falls in the past year? No     Depression Screen PHQ 2/9 Scores 11/24/2014  PHQ - 2 Score 0  PHQ- 9 Score 0    Cognitive Testing - 6-CIT  Correct? Score   What year is it? yes 0 0 or 4  What month is it? yes 0 0 or 3  Memorize:    Pia Mau,   40,  [address]  [place],      What time is it? (within 1 hour) yes 0 0 or 3  Count backwards from 20 yes 0 0, 2, or 4  Name the months of the year yes 0 0, 2, or 4  Repeat name & address above yes 4 0, 2, 4, 6, 8, or 10       TOTAL SCORE  4/28   Interpretation:  Normal  Normal (0-7) Abnormal (8-28)       Assessment & Plan:     Annual Wellness Visit  Reviewed patient's Family Medical History Reviewed and updated list of patient's medical providers Assessment of cognitive impairment was done Assessed patient's functional ability Established a written schedule for health screening [place] Completed and Reviewed  Exercise Activities and Dietary recommendations Goals    None      Immunization History  Administered Date(s) Administered  . Influenza-Unspecified 01/16/2014  . Pneumococcal Conjugate-13 11/20/2013  . Pneumococcal Polysaccharide-23 02/24/2004, 08/26/2009  . Td 04/18/2001        Discussed health benefits of physical activity, and encouraged him to engage in regular exercise appropriate for his age and condition.    ------------------------------------------------------------------------------------------------------------ 1. Annual physical exam Doing well. Declined Zostavax and Tdap due to not being covered by insurance. Counseled regarding recommendations for PSA and prostate exam screening. Based on Guidelines, these screenings were declined.   2. History of tobacco use Discussed low dose chest CT for lung cancer screening and recommendation of USPSTF. He declined this test for the time being, but will consider further.   3. Type 2 diabetes mellitus without complication Doing well current medications.  - Hemoglobin A1c  4. Essential (primary) hypertension well controlled Continue current medications.   - Renal function panel  5. Arteriosclerosis of coronary artery Asymptomatic. Compliant with medication.  Continue aggressive  risk factor modification.  Continue routine follow up with Dr. Saralyn Pilar - Lipid panel

## 2014-11-25 ENCOUNTER — Other Ambulatory Visit: Payer: Self-pay | Admitting: Family Medicine

## 2014-11-25 ENCOUNTER — Telehealth: Payer: Self-pay

## 2014-11-25 LAB — LIPID PANEL
CHOL/HDL RATIO: 4.9 ratio (ref 0.0–5.0)
Cholesterol, Total: 205 mg/dL — ABNORMAL HIGH (ref 100–199)
HDL: 42 mg/dL (ref >39)
LDL Calculated: 101 mg/dL — ABNORMAL HIGH (ref 0–99)
Triglycerides: 311 mg/dL — ABNORMAL HIGH (ref 0–149)
VLDL CHOLESTEROL CAL: 62 mg/dL — AB (ref 5–40)

## 2014-11-25 LAB — RENAL FUNCTION PANEL
Albumin: 4 g/dL (ref 3.5–4.8)
BUN / CREAT RATIO: 19 (ref 10–22)
BUN: 24 mg/dL (ref 8–27)
CALCIUM: 10 mg/dL (ref 8.6–10.2)
CO2: 21 mmol/L (ref 18–29)
Chloride: 102 mmol/L (ref 97–108)
Creatinine, Ser: 1.28 mg/dL — ABNORMAL HIGH (ref 0.76–1.27)
GFR calc Af Amer: 65 mL/min/{1.73_m2} (ref >59)
GFR calc non Af Amer: 56 mL/min/{1.73_m2} — ABNORMAL LOW (ref >59)
Glucose: 184 mg/dL — ABNORMAL HIGH (ref 65–99)
Phosphorus: 3.8 mg/dL (ref 2.5–4.5)
Potassium: 5.5 mmol/L — ABNORMAL HIGH (ref 3.5–5.2)
SODIUM: 142 mmol/L (ref 134–144)

## 2014-11-25 LAB — HEMOGLOBIN A1C
Est. average glucose Bld gHb Est-mCnc: 206 mg/dL
Hgb A1c MFr Bld: 8.8 % — ABNORMAL HIGH (ref 4.8–5.6)

## 2014-11-25 NOTE — Telephone Encounter (Signed)
-----   Message from Birdie Sons, MD sent at 11/25/2014 12:38 PM EDT ----- A1c is up to 8.8. Triglycerides are high at 311. Need to increase glipizide XL to 10mg  once every day with breakfast, #30, rf x 3. Continue other meds unchanged. Follow up in 3 months.

## 2014-11-25 NOTE — Telephone Encounter (Signed)
Please send in prescription to the pharmacy of his choice and schedule follow up in 3 months. Thanks.

## 2014-11-25 NOTE — Telephone Encounter (Signed)
Advised pt of the notes below, pt verbalized fully understanding.  Thanks,

## 2014-11-26 ENCOUNTER — Other Ambulatory Visit: Payer: Self-pay | Admitting: *Deleted

## 2014-11-26 MED ORDER — GLIPIZIDE ER 10 MG PO TB24: 10.0000 mg | ORAL_TABLET | Freq: Every day | ORAL | Status: DC

## 2014-11-26 NOTE — Telephone Encounter (Signed)
Patient's rx for glipizide XL 5 mg qd was changed to glipizide 10 mg qd, on 11/25/2014, per fisher. rx sent to [facility].

## 2014-12-31 ENCOUNTER — Other Ambulatory Visit: Payer: Self-pay | Admitting: Family Medicine

## 2015-02-16 LAB — HM DIABETES EYE EXAM

## 2015-04-04 ENCOUNTER — Other Ambulatory Visit: Payer: Self-pay | Admitting: Family Medicine

## 2015-04-28 DIAGNOSIS — C44629 Squamous cell carcinoma of skin of left upper limb, including shoulder: Secondary | ICD-10-CM | POA: Diagnosis not present

## 2015-04-28 DIAGNOSIS — D485 Neoplasm of uncertain behavior of skin: Secondary | ICD-10-CM | POA: Diagnosis not present

## 2015-05-27 DIAGNOSIS — C44629 Squamous cell carcinoma of skin of left upper limb, including shoulder: Secondary | ICD-10-CM | POA: Diagnosis not present

## 2015-07-02 DIAGNOSIS — L821 Other seborrheic keratosis: Secondary | ICD-10-CM | POA: Diagnosis not present

## 2015-07-02 DIAGNOSIS — Z85828 Personal history of other malignant neoplasm of skin: Secondary | ICD-10-CM | POA: Diagnosis not present

## 2015-07-02 DIAGNOSIS — C44629 Squamous cell carcinoma of skin of left upper limb, including shoulder: Secondary | ICD-10-CM | POA: Diagnosis not present

## 2015-07-02 DIAGNOSIS — Z08 Encounter for follow-up examination after completed treatment for malignant neoplasm: Secondary | ICD-10-CM | POA: Diagnosis not present

## 2015-07-02 DIAGNOSIS — Z1283 Encounter for screening for malignant neoplasm of skin: Secondary | ICD-10-CM | POA: Diagnosis not present

## 2015-07-02 DIAGNOSIS — L570 Actinic keratosis: Secondary | ICD-10-CM | POA: Diagnosis not present

## 2015-07-02 DIAGNOSIS — D485 Neoplasm of uncertain behavior of skin: Secondary | ICD-10-CM | POA: Diagnosis not present

## 2015-08-10 DIAGNOSIS — C44629 Squamous cell carcinoma of skin of left upper limb, including shoulder: Secondary | ICD-10-CM | POA: Diagnosis not present

## 2015-09-18 DIAGNOSIS — D485 Neoplasm of uncertain behavior of skin: Secondary | ICD-10-CM | POA: Diagnosis not present

## 2015-09-18 DIAGNOSIS — L570 Actinic keratosis: Secondary | ICD-10-CM | POA: Diagnosis not present

## 2015-09-18 DIAGNOSIS — C44629 Squamous cell carcinoma of skin of left upper limb, including shoulder: Secondary | ICD-10-CM | POA: Diagnosis not present

## 2015-10-12 ENCOUNTER — Other Ambulatory Visit: Payer: Self-pay | Admitting: Family Medicine

## 2015-11-02 DIAGNOSIS — C44629 Squamous cell carcinoma of skin of left upper limb, including shoulder: Secondary | ICD-10-CM | POA: Diagnosis not present

## 2015-11-10 ENCOUNTER — Encounter: Payer: Self-pay | Admitting: Family Medicine

## 2015-12-12 ENCOUNTER — Other Ambulatory Visit: Payer: Self-pay | Admitting: Family Medicine

## 2016-01-15 ENCOUNTER — Other Ambulatory Visit: Payer: Self-pay | Admitting: Family Medicine

## 2016-02-03 DIAGNOSIS — Z1283 Encounter for screening for malignant neoplasm of skin: Secondary | ICD-10-CM | POA: Diagnosis not present

## 2016-02-03 DIAGNOSIS — Z08 Encounter for follow-up examination after completed treatment for malignant neoplasm: Secondary | ICD-10-CM | POA: Diagnosis not present

## 2016-02-03 DIAGNOSIS — L578 Other skin changes due to chronic exposure to nonionizing radiation: Secondary | ICD-10-CM | POA: Diagnosis not present

## 2016-02-03 DIAGNOSIS — L570 Actinic keratosis: Secondary | ICD-10-CM | POA: Diagnosis not present

## 2016-02-03 DIAGNOSIS — L821 Other seborrheic keratosis: Secondary | ICD-10-CM | POA: Diagnosis not present

## 2016-02-03 DIAGNOSIS — Z85828 Personal history of other malignant neoplasm of skin: Secondary | ICD-10-CM | POA: Diagnosis not present

## 2016-02-05 DIAGNOSIS — H43811 Vitreous degeneration, right eye: Secondary | ICD-10-CM | POA: Diagnosis not present

## 2016-02-05 DIAGNOSIS — E119 Type 2 diabetes mellitus without complications: Secondary | ICD-10-CM | POA: Diagnosis not present

## 2016-02-22 DIAGNOSIS — H33001 Unspecified retinal detachment with retinal break, right eye: Secondary | ICD-10-CM | POA: Diagnosis not present

## 2016-02-23 DIAGNOSIS — H33021 Retinal detachment with multiple breaks, right eye: Secondary | ICD-10-CM | POA: Diagnosis not present

## 2016-02-23 DIAGNOSIS — H43813 Vitreous degeneration, bilateral: Secondary | ICD-10-CM | POA: Diagnosis not present

## 2016-02-23 DIAGNOSIS — H43392 Other vitreous opacities, left eye: Secondary | ICD-10-CM | POA: Diagnosis not present

## 2016-02-25 DIAGNOSIS — H33021 Retinal detachment with multiple breaks, right eye: Secondary | ICD-10-CM | POA: Diagnosis not present

## 2016-04-29 ENCOUNTER — Ambulatory Visit (INDEPENDENT_AMBULATORY_CARE_PROVIDER_SITE_OTHER): Payer: Medicare Other | Admitting: Family Medicine

## 2016-04-29 ENCOUNTER — Encounter: Payer: Self-pay | Admitting: Family Medicine

## 2016-04-29 VITALS — BP 180/80 | HR 49 | Temp 98.4°F | Resp 16 | Wt 167.0 lb

## 2016-04-29 DIAGNOSIS — L259 Unspecified contact dermatitis, unspecified cause: Secondary | ICD-10-CM | POA: Diagnosis not present

## 2016-04-29 MED ORDER — CLOTRIMAZOLE-BETAMETHASONE 1-0.05 % EX CREA: 1.0000 "application " | TOPICAL_CREAM | Freq: Two times a day (BID) | CUTANEOUS | 1 refills | Status: DC

## 2016-04-29 NOTE — Progress Notes (Signed)
Patient: Thomas [name] Male    DOB: July 15, 1945   71 y.o.   MRN: [id number] Visit Date: 04/29/2016  Today's Provider: Lelon Huh, MD   Chief Complaint  Patient presents with  . Rash   Subjective:    rsah started 2 months ago on the night patient had his eye surgery. Patient stated that rash began on his chest and gradually spread to his arms and torso. Rash is scaly, dry and itches. Patient has been taking otc anti-itch creams and benadryl with only mild temporary relief.    Rash  This is a new problem. The current episode started more than 1 month ago (2 months ago). The problem is unchanged. The affected locations include the abdomen, chest, neck, torso, back, left arm and right arm. The rash is characterized by itchiness, dryness and scaling. He was exposed to a new medication. Pertinent negatives include no anorexia, congestion, cough, diarrhea, eye pain, facial edema, fatigue, fever, joint pain, nail changes, rhinorrhea, shortness of breath, sore throat or vomiting. Past treatments include anti-itch cream (benadryl ). The treatment provided mild relief.   Was prescribed tobramycin Dorzola and prenisolone eye drops, in addition to .oxycodone/apap. Rash gradually spread from neck and shoulders down to abdomen. Has been hydrocortisone cream which has helped with rash on abdomen and chest.    Allergies  Allergen Reactions  . Ciprofloxacin      Current Outpatient Prescriptions:  .  atenolol (TENORMIN) 25 MG tablet, TAKE ONE (1) TABLET BY MOUTH EVERY DAY, Disp: 30 tablet, Rfl: 12 .  atorvastatin (LIPITOR) 80 MG tablet, TAKE ONE (1) TABLET BY MOUTH EVERY DAY, Disp: 30 tablet, Rfl: 0 .  colchicine 0.6 MG tablet, Take 0.6 tablets by mouth as needed. Take 0.6 mg by mouth 2 (two) times daily as needed. Take 2 tablets (1.2mg ) by mouth at first sign of gout flare followed by 1 tablet (0.6mg ) after 1 hour. (Max 1.8mg  within 1 hour)  0.6, Disp: , Rfl:  .  dicyclomine (BENTYL) 10 MG  capsule, Take 10 mg by mouth every 8 (eight) hours. , Disp: , Rfl:  .  ferrous gluconate (FERGON) 240 (27 FE) MG tablet, Take 240 mg by mouth 2 (two) times daily. , Disp: , Rfl:  .  GLIPIZIDE XL 10 MG 24 hr tablet, TAKE ONE (1) TABLET BY MOUTH EVERY DAY WITH BREAKFAST, Disp: 30 tablet, Rfl: 12 .  glucose blood test strip, , Disp: , Rfl:  .  lisinopril-hydrochlorothiazide (PRINZIDE,ZESTORETIC) 20-12.5 MG tablet, TAKE ONE (1) TABLET BY MOUTH EVERY DAY, Disp: 30 tablet, Rfl: 12 .  metFORMIN (GLUCOPHAGE-XR) 500 MG 24 hr tablet, TAKE TWO (2) TABLETS BY MOUTH DAILY, Disp: 60 tablet, Rfl: 5 .  pantoprazole (PROTONIX) 40 MG tablet, Take 40 mg by mouth daily. , Disp: , Rfl:  .  sucralfate (CARAFATE) 1 G tablet, Take 1 g by mouth 4 (four) times daily. , Disp: , Rfl:   Review of Systems  Constitutional: Negative for appetite change, chills, fatigue and fever.  HENT: Negative for congestion, rhinorrhea and sore throat.   Eyes: Negative for pain.  Respiratory: Negative for cough, chest tightness, shortness of breath and wheezing.   Cardiovascular: Negative for chest pain and palpitations.  Gastrointestinal: Negative for abdominal pain, anorexia, diarrhea, nausea and vomiting.  Musculoskeletal: Negative for joint pain.  Skin: Positive for rash. Negative for nail changes.    Social History  Substance Use Topics  . Smoking status: Former Research scientist (life sciences)  . Smokeless tobacco:  Not on file  . Alcohol use 0.0 oz/week   Objective:   BP (!) 180/80 (BP Location: Left Arm, Patient Position: Sitting, Cuff Size: Normal)   Pulse (!) 49   Temp 98.4 F (36.9 C) (Oral)   Resp 16   Wt 167 lb (75.8 kg)   SpO2 96%   BMI 26.95 kg/m   Physical Exam  General appearance: alert, well developed, well nourished, cooperative and in no distress Head: Normocephalic, without obvious abnormality, atraumatic Respiratory: Respirations even and unlabored, normal respiratory rate Extremities: No gross deformities Skin: Diffuse  scabbed small lesions across back about 38mm in dm, no surrounding erythema. No discharge     Assessment & Plan:     .1. 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  - clotrimazole-betamethasone (LOTRISONE) cream; Apply 1 application topically 2 (two) times daily.  Dispense: 100 g; Refill: 1  Follow up with his regular dermatologist if not much better within a week.   Noted to have elevated ion systolic BP today. He states he feels fine and thinks he took his medications. States BP was normal when he had eye surgery. Will monitor at home, avoid sodium in diet and is schedule CPE in the near future.       Lelon Huh, MD  [facility]

## 2016-05-27 ENCOUNTER — Other Ambulatory Visit: Payer: Self-pay | Admitting: Family Medicine

## 2016-06-01 DIAGNOSIS — L111 Transient acantholytic dermatosis [Grover]: Secondary | ICD-10-CM | POA: Diagnosis not present

## 2016-06-01 DIAGNOSIS — L821 Other seborrheic keratosis: Secondary | ICD-10-CM | POA: Diagnosis not present

## 2016-06-01 DIAGNOSIS — B354 Tinea corporis: Secondary | ICD-10-CM | POA: Diagnosis not present

## 2016-06-01 DIAGNOSIS — L570 Actinic keratosis: Secondary | ICD-10-CM | POA: Diagnosis not present

## 2016-06-02 ENCOUNTER — Other Ambulatory Visit: Payer: Self-pay | Admitting: Family Medicine

## 2016-06-03 ENCOUNTER — Ambulatory Visit (INDEPENDENT_AMBULATORY_CARE_PROVIDER_SITE_OTHER): Payer: Medicare Other | Admitting: Family Medicine

## 2016-06-03 ENCOUNTER — Ambulatory Visit (INDEPENDENT_AMBULATORY_CARE_PROVIDER_SITE_OTHER): Payer: Medicare Other

## 2016-06-03 VITALS — BP 158/80 | HR 60 | Temp 97.8°F | Ht 66.0 in | Wt 167.2 lb

## 2016-06-03 DIAGNOSIS — Z125 Encounter for screening for malignant neoplasm of prostate: Secondary | ICD-10-CM | POA: Diagnosis not present

## 2016-06-03 DIAGNOSIS — Z0000 Encounter for general adult medical examination without abnormal findings: Secondary | ICD-10-CM | POA: Diagnosis not present

## 2016-06-03 DIAGNOSIS — E119 Type 2 diabetes mellitus without complications: Secondary | ICD-10-CM

## 2016-06-03 DIAGNOSIS — K2210 Ulcer of esophagus without bleeding: Secondary | ICD-10-CM | POA: Diagnosis not present

## 2016-06-03 DIAGNOSIS — Z1159 Encounter for screening for other viral diseases: Secondary | ICD-10-CM

## 2016-06-03 DIAGNOSIS — Z87891 Personal history of nicotine dependence: Secondary | ICD-10-CM

## 2016-06-03 DIAGNOSIS — I2510 Atherosclerotic heart disease of native coronary artery without angina pectoris: Secondary | ICD-10-CM | POA: Diagnosis not present

## 2016-06-03 LAB — POCT GLYCOSYLATED HEMOGLOBIN (HGB A1C)
Est. average glucose Bld gHb Est-mCnc: 183
Hemoglobin A1C: 8

## 2016-06-03 MED ORDER — COLCHICINE 0.6 MG PO TABS: 0.6000 mg | ORAL_TABLET | Freq: Every day | ORAL | 5 refills | Status: DC

## 2016-06-03 MED ORDER — PANTOPRAZOLE SODIUM 40 MG PO TBEC: 40.0000 mg | DELAYED_RELEASE_TABLET | Freq: Every day | ORAL | 12 refills | Status: DC

## 2016-06-03 MED ORDER — BUPROPION HCL ER (SR) 150 MG PO TB12: ORAL_TABLET | ORAL | 5 refills | Status: DC

## 2016-06-03 NOTE — Progress Notes (Signed)
Patient: Thomas [name] Male    DOB: 1946-04-02   71 y.o.   MRN: [id number] Visit Date: 06/03/2016  Today's Provider: Lelon Huh, MD   Chief Complaint  Patient presents with  . Annual Exam  . Hypertension  . Diabetes   Subjective:    HPI Patient presents for yearly physical. Completed AWV with [facility] this morning. No complaints today.  Arteriosclerosis of coronary artery Denies chest pains or heart flutters. Tolerating medications well. Has follow up with Dr. Saralyn Pilar on 08/01/2016. Has not been taking daily ECASA.    Diabetes Mellitus Type II, Follow-up:   Lab Results  Component Value Date   HGBA1C 8.0 06/03/2016   HGBA1C 8.8 (H) 11/24/2014   HGBA1C 7.2 (A) 02/21/2014   Last seen for diabetes 11/24/2014.  Management since then includes; increased glipizide xL to 10 mg qd. He reports good compliance with treatment. He is not having side effects. none Current symptoms include none and have been unchanged. Home blood sugar records: fasting range: 11-130  Episodes of hypoglycemia? no   Current Insulin Regimen: n/a Most Recent Eye Exam: due Weight trend: stable Prior visit with dietician: no Current diet: well balanced Current exercise: none  ----------------------------------------------------------------   Hypertension, follow-up:  BP Readings from Last 3 Encounters:  06/03/16 (!) 158/80  04/29/16 (!) 180/80  11/24/14 120/80    He was last seen for hypertension 11/24/2014.  BP at that visit was 120/80. Management since that visit includes; no changes.He reports good compliance with treatment. He is not having side effects. none He is not exercising. He is adherent to low salt diet.   Outside blood pressures are not checking. He is experiencing none.  Patient denies none.   Cardiovascular risk factors include diabetes mellitus.  Use of agents associated with hypertension: none.    ----------------------------------------------------------------    Allergies  Allergen Reactions  . Ciprofloxacin      Current Outpatient Prescriptions:  .  atenolol (TENORMIN) 25 MG tablet, TAKE ONE (1) TABLET BY MOUTH EVERY DAY, Disp: 30 tablet, Rfl: 12 .  atorvastatin (LIPITOR) 80 MG tablet, TAKE ONE (1) TABLET BY MOUTH EVERY DAY, Disp: 30 tablet, Rfl: 0 .  clotrimazole-betamethasone (LOTRISONE) cream, Apply 1 application topically 2 (two) times daily. (Patient not taking: Reported on 06/03/2016), Disp: 100 g, Rfl: 1 .  colchicine 0.6 MG tablet, Take 0.6 tablets by mouth as needed. Take 0.6 mg by mouth 2 (two) times daily as needed. Take 2 tablets (1.2mg ) by mouth at first sign of gout flare followed by 1 tablet (0.6mg ) after 1 hour. (Max 1.8mg  within 1 hour)  0.6, Disp: , Rfl:  .  GLIPIZIDE XL 10 MG 24 hr tablet, TAKE ONE (1) TABLET BY MOUTH EVERY DAY WITH BREAKFAST, Disp: 30 tablet, Rfl: 12 .  glucose blood test strip, , Disp: , Rfl:  .  lisinopril-hydrochlorothiazide (PRINZIDE,ZESTORETIC) 20-12.5 MG tablet, TAKE ONE (1) TABLET BY MOUTH EVERY DAY, Disp: 30 tablet, Rfl: 12 .  metFORMIN (GLUCOPHAGE-XR) 500 MG 24 hr tablet, TAKE TWO (2) TABLETS BY MOUTH DAILY, Disp: 60 tablet, Rfl: 5 .  pantoprazole (PROTONIX) 40 MG tablet, Take 40 mg by mouth daily. , Disp: , Rfl:  .  terbinafine (LAMISIL) 250 MG tablet, Take 250 mg by mouth daily., Disp: , Rfl:  .  triamcinolone cream (KENALOG) 0.1 %, Apply 1 application topically 2 (two) times daily., Disp: , Rfl:   Review of Systems  Constitutional: Negative for appetite change, chills and fever.  Respiratory:  Negative for chest tightness, shortness of breath and wheezing.   Cardiovascular: Negative for chest pain and palpitations.  Gastrointestinal: Negative for abdominal pain, nausea and vomiting.    Social History  Substance Use Topics  . Smoking status: Current Every Day Smoker    Packs/day: 0.50    Types: Cigarettes  . Smokeless  tobacco: Never Used  . Alcohol use 0.0 oz/week     Comment: rare- beer   Objective:  Vitals: BP (!) 158/80 (BP Location: Right Arm)   Pulse 60   Temp 97.8 F (36.6 C) (Oral)   Ht 5\' 6"  (1.676 m)   Wt 167 lb 3.2 oz (75.8 kg)   BMI 26.99 kg/m   Body mass index is 26.99 kg/m.  Physical Exam   General Appearance:    Alert, cooperative, no distress, appears stated age  Head:    Normocephalic, without obvious abnormality, atraumatic  Eyes:    PERRL, conjunctiva/corneas clear, EOM's intact, fundi    benign, both eyes       Ears:    Normal TM's and external ear canals, both ears  Nose:   Nares normal, septum midline, mucosa normal, no drainage   or sinus tenderness  Throat:   Lips, mucosa, and tongue normal; teeth and gums normal  Neck:   Supple, symmetrical, trachea midline, no adenopathy;       thyroid:  No enlargement/tenderness/nodules; no carotid   bruit or JVD  Back:     Symmetric, no curvature, ROM normal, no CVA tenderness  Lungs:     Clear to auscultation bilaterally, respirations unlabored  Chest wall:    No tenderness or deformity  Heart:    Regular rate and rhythm, S1 and S2 normal, no murmur, rub   or gallop  Abdomen:     Soft, non-tender, bowel sounds active all four quadrants,    no masses, no organomegaly  Genitalia:    deferred  Rectal:    deferred  Extremities:   Extremities normal, atraumatic, no cyanosis or edema  Pulses:   2+ and symmetric all extremities  Skin:   Skin color, texture, turgor normal, no rashes or lesions  Lymph nodes:   Cervical, supraclavicular, and axillary nodes normal  Neurologic:   CNII-XII intact. Normal strength, sensation and reflexes      throughout       Assessment & Plan:     1. Annual physical exam   2. Type 2 diabetes mellitus without complication, unspecified long term insulin use status (HCC)  - POCT glycosylated hemoglobin (Hb A1C) - Renal function panel  3. Arteriosclerosis of coronary artery Asymptomatic.  Compliant with medication.  Continue aggressive risk factor modification.  Counseled on importance of taking daily 81mg  ECASA for cardiac benefits. Follow up [facility] as scheduled.  - Lipid panel - Hepatic function panel - CBC  4. History of smoking 30 or more pack years  - buPROPion (WELLBUTRIN SR) 150 MG 12 hr tablet; 1 tablet daily for 3 days, then 1 tablet twice daily. Stop smoking 14 days after starting medication  Dispense: 60 tablet; Refill: 5  5. Prostate cancer screening  - PSA  6. Need for hepatitis C screening test  - Hepatitis C antibody  7. Erosive esophagitis Doing well on pantoprazole. Refilled today.  - pantoprazole (PROTONIX) 40 MG tablet; Take 1 tablet (40 mg total) by mouth daily.  Dispense: 30 tablet; Refill: 12       Lelon Huh, MD  [facility]

## 2016-06-03 NOTE — Progress Notes (Signed)
Subjective:   Thomas [name] is a 71 y.o. male who presents for Medicare Annual/Subsequent preventive examination.  Review of Systems:  N/A   Cardiac Risk Factors include: advanced age (>68men, >35 women);diabetes mellitus;dyslipidemia;hypertension;male gender;smoking/ tobacco exposure     Objective:    Vitals: BP (!) 158/80 (BP Location: Right Arm)   Pulse 60   Temp 97.8 F (36.6 C) (Oral)   Ht 5\' 6"  (1.676 m)   Wt 167 lb 3.2 oz (75.8 kg)   BMI 26.99 kg/m   Body mass index is 26.99 kg/m.  Tobacco History  Smoking Status  . Current Every Day Smoker  . Packs/day: 0.50  . Types: Cigarettes  Smokeless Tobacco  . Never Used     Ready to quit: No Counseling given: No   Past Medical History:  Diagnosis Date  . Allergy   . CAD (coronary artery disease)   . Diabetes mellitus without complication ([place])   . Erosive esophagitis   . Gouty arthropathy   . History of colonic polyps   . History of MRSA infection   . Hyperkalemia   . Hyperlipidemia   . Hypertension   . Melanoma ([place])   . Trigger finger of left hand   . Ureteral tumor    Past Surgical History:  Procedure Laterality Date  . CATARACT EXTRACTION    . CORONARY ARTERY BYPASS GRAFT  1996  . EXCISION Ureteral Tumor     Family History  Problem Relation Age of Onset  . Alzheimer's disease Mother   . Alzheimer's disease Brother   . Lung cancer Paternal Grandfather    History  Sexual Activity  . Sexual activity: Not on file    Outpatient Encounter Prescriptions as of 06/03/2016  Medication Sig  . atenolol (TENORMIN) 25 MG tablet TAKE ONE (1) TABLET BY MOUTH EVERY DAY  . atorvastatin (LIPITOR) 80 MG tablet TAKE ONE (1) TABLET BY MOUTH EVERY DAY  . colchicine 0.6 MG tablet Take 0.6 tablets by mouth as needed. Take 0.6 mg by mouth 2 (two) times daily as needed. Take 2 tablets (1.2mg ) by mouth at first sign of gout flare followed by 1 tablet (0.6mg ) after 1 hour. (Max 1.8mg  within 1 hour)  0.6  . GLIPIZIDE  XL 10 MG 24 hr tablet TAKE ONE (1) TABLET BY MOUTH EVERY DAY WITH BREAKFAST  . glucose blood test strip   . lisinopril-hydrochlorothiazide (PRINZIDE,ZESTORETIC) 20-12.5 MG tablet TAKE ONE (1) TABLET BY MOUTH EVERY DAY  . metFORMIN (GLUCOPHAGE-XR) 500 MG 24 hr tablet TAKE TWO (2) TABLETS BY MOUTH DAILY  . terbinafine (LAMISIL) 250 MG tablet Take 250 mg by mouth daily.  Marland Kitchen triamcinolone cream (KENALOG) 0.1 % Apply 1 application topically 2 (two) times daily.  . clotrimazole-betamethasone (LOTRISONE) cream Apply 1 application topically 2 (two) times daily. (Patient not taking: Reported on 06/03/2016)  . dicyclomine (BENTYL) 10 MG capsule Take 10 mg by mouth every 8 (eight) hours.   . ferrous gluconate (FERGON) 240 (27 FE) MG tablet Take 240 mg by mouth 2 (two) times daily.   . pantoprazole (PROTONIX) 40 MG tablet Take 40 mg by mouth daily.   . sucralfate (CARAFATE) 1 G tablet Take 1 g by mouth 4 (four) times daily.    No facility-administered encounter medications on file as of 06/03/2016.     Activities of Daily Living In your present state of health, do you have any difficulty performing the following activities: 06/03/2016  Hearing? Y  Vision? N  Difficulty concentrating or making  decisions? N  Walking or climbing stairs? N  Dressing or bathing? N  Doing errands, shopping? N  Preparing Food and eating ? N  Using the Toilet? N  In the past six months, have you accidently leaked urine? N  Do you have problems with loss of bowel control? N  Managing your Medications? N  Managing your Finances? N  Housekeeping or managing your Housekeeping? N  Some recent data might be hidden    Patient Care Team: Birdie Sons, MD as PCP - General (Family Medicine) Isaias Cowman, MD as Consulting Physician (Cardiology) Jannet Mantis, MD as Consulting Physician (Dermatology) D Orion Modest, OD as Consulting Physician (Optometry)   Assessment:     Exercise Activities and Dietary  recommendations Current Exercise Habits: The patient does not participate in regular exercise at present  Goals    . Exercise           Starting in spring 2018, I will start walking daily for a quarter mile and work up to 1 mile daily.      Fall Risk Fall Risk  06/03/2016 11/24/2014  Falls in the past year? No No   Depression Screen PHQ 2/9 Scores 06/03/2016 11/24/2014  PHQ - 2 Score 0 0  PHQ- 9 Score - 0    Cognitive Function     6CIT Screen 06/03/2016  What Year? 0 points  What month? 0 points  What time? 0 points  Count back from 20 0 points  Months in reverse 0 points  Repeat phrase 6 points  Total Score 6    Immunization History  Administered Date(s) Administered  . Influenza-Unspecified 01/16/2014  . Pneumococcal Conjugate-13 11/20/2013  . Pneumococcal Polysaccharide-23 02/24/2004, 08/26/2009  . Td 04/18/2001   Screening Tests Health Maintenance  Topic Date Due  . FOOT EXAM  02/03/1954  . HEMOGLOBIN A1C  05/27/2015  . Hepatitis C Screening  04/18/2017 (Originally 07/03/1945)  . ZOSTAVAX  04/18/2026 (Originally 02/04/2004)  . TETANUS/TDAP  04/18/2026 (Originally 04/19/2011)  . OPHTHALMOLOGY EXAM  02/28/2017  . COLONOSCOPY  03/03/2024  . INFLUENZA VACCINE  Completed  . PNA vac Low Risk Adult  Completed      Plan:  I have personally reviewed and addressed the Medicare Annual Wellness questionnaire and have noted the following in the patient's chart:  A. Medical and social history B. Use of alcohol, tobacco or illicit drugs  C. Current medications and supplements D. Functional ability and status E.  Nutritional status F.  Physical activity G. Advance directives H. List of other physicians I.  Hospitalizations, surgeries, and ER visits in previous 12 months J.  [place] such as hearing and vision if needed, cognitive and depression L. Referrals and appointments - none  In addition, I have reviewed and discussed with patient certain preventive  protocols, quality metrics, and best practice recommendations. A written personalized care plan for preventive services as well as general preventive health recommendations were provided to patient.  See attached scanned questionnaire for additional information.   Signed,  Fabio Neighbors, LPN Nurse Health Advisor   MD Recommendations: Follow-up on diabetic foot exam and Hgb A1c today. Pt to check with insurance about coverage for Hep C screening before having done.  I have reviewed the health advisor's note, was available for consultation, and agree with documentation and plan  Lelon Huh, MD

## 2016-06-03 NOTE — Patient Instructions (Addendum)
   Recommend taking 81mg  enteric coated aspirin to reduce risk of vascular events such as heart attacks and strokes.     Please stop smoking   You should consider starting prescription Jardiance to reduce risk of more heart problems and to help your sugar.

## 2016-06-03 NOTE — Patient Instructions (Signed)

## 2016-06-04 LAB — CBC
HEMATOCRIT: 45.1 % (ref 37.5–51.0)
Hemoglobin: 15.2 g/dL (ref 13.0–17.7)
MCH: 31.3 pg (ref 26.6–33.0)
MCHC: 33.7 g/dL (ref 31.5–35.7)
MCV: 93 fL (ref 79–97)
Platelets: 388 10*3/uL — ABNORMAL HIGH (ref 150–379)
RBC: 4.86 x10E6/uL (ref 4.14–5.80)
RDW: 13.9 % (ref 12.3–15.4)
WBC: 9.6 10*3/uL (ref 3.4–10.8)

## 2016-06-04 LAB — LIPID PANEL
Chol/HDL Ratio: 11.2 ratio units — ABNORMAL HIGH (ref 0.0–5.0)
Cholesterol, Total: 404 mg/dL — ABNORMAL HIGH (ref 100–199)
HDL: 36 mg/dL — ABNORMAL LOW (ref >39)
Triglycerides: 642 mg/dL (ref 0–149)

## 2016-06-04 LAB — HEPATITIS C ANTIBODY

## 2016-06-04 LAB — RENAL FUNCTION PANEL
Albumin: 3.6 g/dL (ref 3.5–4.8)
BUN / CREAT RATIO: 14 (ref 10–24)
BUN: 26 mg/dL (ref 8–27)
CHLORIDE: 101 mmol/L (ref 96–106)
CO2: 23 mmol/L (ref 18–29)
Calcium: 9.8 mg/dL (ref 8.6–10.2)
Creatinine, Ser: 1.91 mg/dL — ABNORMAL HIGH (ref 0.76–1.27)
GFR calc non Af Amer: 34 mL/min/{1.73_m2} — ABNORMAL LOW (ref >59)
GFR, EST AFRICAN AMERICAN: 40 mL/min/{1.73_m2} — AB (ref >59)
GLUCOSE: 168 mg/dL — AB (ref 65–99)
POTASSIUM: 5.6 mmol/L — AB (ref 3.5–5.2)
Phosphorus: 3.1 mg/dL (ref 2.5–4.5)
Sodium: 139 mmol/L (ref 134–144)

## 2016-06-04 LAB — PSA: PROSTATE SPECIFIC AG, SERUM: 1.2 ng/mL (ref 0.0–4.0)

## 2016-06-04 LAB — HEPATIC FUNCTION PANEL
ALT: 16 IU/L (ref 0–44)
AST: 13 IU/L (ref 0–40)
Alkaline Phosphatase: 70 IU/L (ref 39–117)
BILIRUBIN, DIRECT: 0.08 mg/dL (ref 0.00–0.40)
Bilirubin Total: <0.2 mg/dL (ref 0.0–1.2)
Total Protein: 6.2 g/dL (ref 6.0–8.5)

## 2016-06-06 ENCOUNTER — Telehealth: Payer: Self-pay | Admitting: *Deleted

## 2016-06-06 NOTE — Telephone Encounter (Signed)
OK. Need to recheck in 7-8 weeks. Call us for order unless we call him first around the second week of April.

## 2016-06-06 NOTE — Telephone Encounter (Signed)
Patient was notified.

## 2016-06-06 NOTE — Telephone Encounter (Signed)
Patient was notified of results. Patient stated that he had been off atorvastatin for a while and started taking it again a week ago.

## 2016-06-06 NOTE — Telephone Encounter (Signed)
-----   Message from Birdie Sons, MD sent at 06/04/2016  7:00 PM EST ----- Cholesterol is EXTREMELY high at 404. This puts him at very high risk of having a heart attack. Please check with patient to see if he has been taking his cholesterol medication (atorvastatin) consistently. If so then he needs a change in medications.

## 2016-06-08 ENCOUNTER — Telehealth: Payer: Self-pay | Admitting: *Deleted

## 2016-06-08 DIAGNOSIS — Z87891 Personal history of nicotine dependence: Secondary | ICD-10-CM

## 2016-06-08 NOTE — Telephone Encounter (Signed)
Received referral for initial lung cancer screening scan. Contacted patient and obtained smoking history,(current, 38.25 pack year ) as well as answering questions related to screening process. Patient denies signs of lung cancer such as weight loss or hemoptysis. Patient denies comorbidity that would prevent curative treatment if lung cancer were found. Patient is tentatively scheduled for shared decision making visit and CT scan on 06/14/16, pending insurance approval from business office.

## 2016-06-14 ENCOUNTER — Encounter: Payer: Self-pay | Admitting: Oncology

## 2016-06-14 ENCOUNTER — Ambulatory Visit
Admission: RE | Admit: 2016-06-14 | Discharge: 2016-06-14 | Disposition: A | Discharge status: Home / Self Care | Payer: Medicare Other | Source: Ambulatory Visit | Attending: Oncology | Admitting: Oncology

## 2016-06-14 ENCOUNTER — Inpatient Hospital Stay: Payer: Medicare Other | Attending: Oncology | Admitting: Oncology

## 2016-06-14 DIAGNOSIS — Z122 Encounter for screening for malignant neoplasm of respiratory organs: Secondary | ICD-10-CM | POA: Diagnosis not present

## 2016-06-14 DIAGNOSIS — F17210 Nicotine dependence, cigarettes, uncomplicated: Secondary | ICD-10-CM

## 2016-06-14 DIAGNOSIS — Z87891 Personal history of nicotine dependence: Secondary | ICD-10-CM | POA: Insufficient documentation

## 2016-06-14 DIAGNOSIS — I700 Atherosclerosis of aorta: Secondary | ICD-10-CM | POA: Insufficient documentation

## 2016-06-14 NOTE — Progress Notes (Signed)
In accordance with CMS guidelines, patient has met eligibility criteria including age, absence of signs or symptoms of lung cancer.  Social History  Substance Use Topics  . Smoking status: Current Every Day Smoker    Packs/day: 0.75    Years: 51.00    Types: Cigarettes  . Smokeless tobacco: Never Used  . Alcohol use 0.0 oz/week     Comment: rare- beer     A shared decision-making session was conducted prior to the performance of CT scan. This includes one or more decision aids, includes benefits and harms of screening, follow-up diagnostic testing, over-diagnosis, false positive rate, and total radiation exposure.  Counseling on the importance of adherence to annual lung cancer LDCT screening, impact of co-morbidities, and ability or willingness to undergo diagnosis and treatment is imperative for compliance of the program.  Counseling on the importance of continued smoking cessation for former smokers; the importance of smoking cessation for current smokers, and information about tobacco cessation interventions have been given to patient including [facility] and [facility] quit [place] programs.  Written order for lung cancer screening with LDCT has been given to the patient and any and all questions have been answered to the best of my abilities.   Yearly follow up will be coordinated by Burgess Estelle, Thoracic Navigator.

## 2016-06-15 ENCOUNTER — Ambulatory Visit: Payer: Medicare Other

## 2016-06-17 ENCOUNTER — Telehealth: Payer: Self-pay | Admitting: *Deleted

## 2016-06-17 ENCOUNTER — Encounter: Payer: Self-pay | Admitting: Family Medicine

## 2016-06-17 DIAGNOSIS — I700 Atherosclerosis of aorta: Secondary | ICD-10-CM | POA: Insufficient documentation

## 2016-06-17 NOTE — Telephone Encounter (Signed)
Notified patient of LDCT lung cancer screening results with recommendation for 12 month follow up imaging. Also notified of incidental finding noted below and encouraged to discuss with PCP who will receive a copy of this note via Epic. Patient verbalizes understanding.   IMPRESSION: 1. Lung-RADS Category 2, benign appearance or behavior. Continue annual screening with low-dose chest CT without contrast in 12 months. 2.  Aortic atherosclerosis.

## 2016-07-28 ENCOUNTER — Other Ambulatory Visit: Payer: Self-pay | Admitting: Family Medicine

## 2016-08-01 DIAGNOSIS — I10 Essential (primary) hypertension: Secondary | ICD-10-CM | POA: Diagnosis not present

## 2016-08-01 DIAGNOSIS — I25810 Atherosclerosis of coronary artery bypass graft(s) without angina pectoris: Secondary | ICD-10-CM | POA: Diagnosis not present

## 2016-08-01 DIAGNOSIS — E782 Mixed hyperlipidemia: Secondary | ICD-10-CM | POA: Diagnosis not present

## 2016-08-01 DIAGNOSIS — R0602 Shortness of breath: Secondary | ICD-10-CM | POA: Diagnosis not present

## 2016-08-01 DIAGNOSIS — R600 Localized edema: Secondary | ICD-10-CM | POA: Diagnosis not present

## 2016-08-03 DIAGNOSIS — B354 Tinea corporis: Secondary | ICD-10-CM | POA: Diagnosis not present

## 2016-08-03 DIAGNOSIS — D485 Neoplasm of uncertain behavior of skin: Secondary | ICD-10-CM | POA: Diagnosis not present

## 2016-08-03 DIAGNOSIS — L111 Transient acantholytic dermatosis [Grover]: Secondary | ICD-10-CM | POA: Diagnosis not present

## 2016-08-03 DIAGNOSIS — Z872 Personal history of diseases of the skin and subcutaneous tissue: Secondary | ICD-10-CM | POA: Diagnosis not present

## 2016-08-03 DIAGNOSIS — C44222 Squamous cell carcinoma of skin of right ear and external auricular canal: Secondary | ICD-10-CM | POA: Diagnosis not present

## 2016-08-03 DIAGNOSIS — L570 Actinic keratosis: Secondary | ICD-10-CM | POA: Diagnosis not present

## 2016-08-03 DIAGNOSIS — Z859 Personal history of malignant neoplasm, unspecified: Secondary | ICD-10-CM | POA: Diagnosis not present

## 2016-08-07 ENCOUNTER — Telehealth: Payer: Self-pay | Admitting: Family Medicine

## 2016-08-07 DIAGNOSIS — E782 Mixed hyperlipidemia: Secondary | ICD-10-CM

## 2016-08-07 NOTE — Telephone Encounter (Signed)
-----   Message from Birdie Sons, MD sent at 06/06/2016 10:39 AM EST ----- Regarding: check lipids second week of april   Restarted lipitor 06/06/16

## 2016-08-07 NOTE — Telephone Encounter (Signed)
Please advised patient it is time to check fasting lipid panel since getting back on atorvastatin in March. Please leave order for him to pick up at front desk.Marland Kitchen

## 2016-08-08 NOTE — Telephone Encounter (Signed)
Patient notified and lab slip printed.

## 2016-08-10 DIAGNOSIS — E782 Mixed hyperlipidemia: Secondary | ICD-10-CM | POA: Diagnosis not present

## 2016-08-11 LAB — LIPID PANEL
CHOLESTEROL TOTAL: 200 mg/dL — AB (ref 100–199)
Chol/HDL Ratio: 5.6 ratio — ABNORMAL HIGH (ref 0.0–5.0)
HDL: 36 mg/dL — AB (ref >39)
LDL Calculated: 101 mg/dL — ABNORMAL HIGH (ref 0–99)
TRIGLYCERIDES: 315 mg/dL — AB (ref 0–149)
VLDL Cholesterol Cal: 63 mg/dL — ABNORMAL HIGH (ref 5–40)

## 2016-08-22 DIAGNOSIS — C44222 Squamous cell carcinoma of skin of right ear and external auricular canal: Secondary | ICD-10-CM | POA: Diagnosis not present

## 2016-09-30 ENCOUNTER — Encounter: Payer: Self-pay | Admitting: Family Medicine

## 2016-09-30 ENCOUNTER — Ambulatory Visit (INDEPENDENT_AMBULATORY_CARE_PROVIDER_SITE_OTHER): Payer: Medicare Other | Admitting: Family Medicine

## 2016-09-30 VITALS — BP 176/68 | HR 48 | Temp 97.9°F | Resp 18 | Wt 169.0 lb

## 2016-09-30 DIAGNOSIS — E781 Pure hyperglyceridemia: Secondary | ICD-10-CM

## 2016-09-30 DIAGNOSIS — I2510 Atherosclerotic heart disease of native coronary artery without angina pectoris: Secondary | ICD-10-CM

## 2016-09-30 DIAGNOSIS — I739 Peripheral vascular disease, unspecified: Secondary | ICD-10-CM

## 2016-09-30 DIAGNOSIS — I10 Essential (primary) hypertension: Secondary | ICD-10-CM

## 2016-09-30 DIAGNOSIS — E119 Type 2 diabetes mellitus without complications: Secondary | ICD-10-CM

## 2016-09-30 DIAGNOSIS — I493 Ventricular premature depolarization: Secondary | ICD-10-CM

## 2016-09-30 DIAGNOSIS — E785 Hyperlipidemia, unspecified: Secondary | ICD-10-CM | POA: Insufficient documentation

## 2016-09-30 LAB — POCT GLYCOSYLATED HEMOGLOBIN (HGB A1C)
ESTIMATED AVERAGE GLUCOSE: 180
Hemoglobin A1C: 7.9

## 2016-09-30 NOTE — Progress Notes (Signed)
Patient: Thomas [name] Male    DOB: 03/03/1946   71 y.o.   MRN: [id number] Visit Date: 09/30/2016  Today's Provider: Lelon Huh, MD   Chief Complaint  Patient presents with  . Follow-up  . Diabetes  . Hyperlipidemia   Subjective:    HPI  Diabetes Mellitus Type II, Follow-up:   Lab Results  Component Value Date   HGBA1C 8.0 06/03/2016   HGBA1C 8.8 (H) 11/24/2014   HGBA1C 7.2 (A) 02/21/2014    Last seen for diabetes 4 months ago.  Management since then includes no changes. He reports good compliance with treatment. He is not having side effects.  Current symptoms include none and have been stable. Home blood sugar records: varies between 250-280 in the evernings and 115 in the mornings  Episodes of hypoglycemia? no   Current Insulin Regimen: none Most Recent Eye Exam: <1 year ago Weight trend: increasing steadily Prior visit with dietician: no Current diet: well balanced Current exercise: none  Pertinent Labs:    Component Value Date/Time   CHOL 200 (H) 08/10/2016 0828   TRIG 315 (H) 08/10/2016 0828   HDL 36 (L) 08/10/2016 0828   LDLCALC 101 (H) 08/10/2016 0828   CREATININE 1.91 (H) 06/03/2016 1043   CREATININE 1.13 04/03/2014 1535    Wt Readings from Last 3 Encounters:  06/14/16 163 lb (73.9 kg)  06/03/16 167 lb 3.2 oz (75.8 kg)  04/29/16 167 lb (75.8 kg)    ------------------------------------------------------------------------  Lipid/Cholesterol, Follow-up:   Last seen for this4 months ago.  Management changes since that visit include none. . Last Lipid Panel:    Component Value Date/Time   CHOL 200 (H) 08/10/2016 0828   TRIG 315 (H) 08/10/2016 0828   HDL 36 (L) 08/10/2016 0828   CHOLHDL 5.6 (H) 08/10/2016 0828   LDLCALC 101 (H) 08/10/2016 3557    Risk factors for vascular disease include diabetes mellitus and hypercholesterolemia  He reports good compliance with treatment. He felt very fatigued at first, but started taking  B12 and has since felt much better.  He is not having side effects.  Current symptoms include none and have been stable. Weight trend: increasing steadily Prior visit with dietician: no Current diet: well balanced Current exercise: none  Wt Readings from Last 3 Encounters:  06/14/16 163 lb (73.9 kg)  06/03/16 167 lb 3.2 oz (75.8 kg)  04/29/16 167 lb (75.8 kg)    ------------------------------------------------------------------- Follow up of Arteriosclerosis of Coronary Artery:  Patient was last seen for this problem 4 months ago and no changes were made. Patient was advised to to take medication consistently and follow up with Dr. Saralyn Pilar. Patient reports good compliance with treatment and good tolerance. Is scheduled for Myoview and echo later this month.  He also reports that his feet hurt when he walks and has burning pain in his right calf when he walks which goes right away when he stops and rests.     Allergies  Allergen Reactions  . Ciprofloxacin       Current Outpatient Prescriptions:  .  atenolol (TENORMIN) 25 MG tablet, TAKE ONE (1) TABLET BY MOUTH EVERY DAY, Disp: 30 tablet, Rfl: 12 .  atorvastatin (LIPITOR) 80 MG tablet, TAKE ONE (1) TABLET BY MOUTH EVERY DAY, Disp: 30 tablet, Rfl: 5 .  buPROPion (WELLBUTRIN SR) 150 MG 12 hr tablet, 1 tablet daily for 3 days, then 1 tablet twice daily. Stop smoking 14 days after starting medication, Disp: 60 tablet, Rfl:  5 .  clotrimazole-betamethasone (LOTRISONE) cream, Apply 1 application topically 2 (two) times daily., Disp: 100 g, Rfl: 1 .  colchicine 0.6 MG tablet, Take 1 tablet (0.6 mg total) by mouth daily. As needed for gout, Disp: 30 tablet, Rfl: 5 .  GLIPIZIDE XL 10 MG 24 hr tablet, TAKE ONE (1) TABLET BY MOUTH EVERY DAY WITH BREAKFAST, Disp: 30 tablet, Rfl: 12 .  glucose blood test strip, , Disp: , Rfl:  .  lisinopril-hydrochlorothiazide (PRINZIDE,ZESTORETIC) 20-12.5 MG tablet, TAKE ONE (1) TABLET BY MOUTH EVERY DAY,  Disp: 30 tablet, Rfl: 12 .  metFORMIN (GLUCOPHAGE-XR) 500 MG 24 hr tablet, TAKE TWO (2) TABLETS BY MOUTH DAILY, Disp: 60 tablet, Rfl: 5 .  pantoprazole (PROTONIX) 40 MG tablet, Take 1 tablet (40 mg total) by mouth daily., Disp: 30 tablet, Rfl: 12 .  terbinafine (LAMISIL) 250 MG tablet, Take 250 mg by mouth daily., Disp: , Rfl:  .  triamcinolone cream (KENALOG) 0.1 %, Apply 1 application topically 2 (two) times daily., Disp: , Rfl:   Review of Systems  Constitutional: Negative for appetite change, chills and fever.  Respiratory: Negative for chest tightness, shortness of breath and wheezing.   Cardiovascular: Positive for leg swelling (in both ankles). Negative for chest pain and palpitations.  Gastrointestinal: Negative for abdominal pain, nausea and vomiting.    Current Exercise Habits: The patient does not participate in regular exercise at present Exercise limited by: None identified   Social History  Substance Use Topics  . Smoking status: Current Every Day Smoker    Packs/day: 0.75    Years: 51.00    Types: Cigarettes  . Smokeless tobacco: Never Used  . Alcohol use 0.0 oz/week     Comment: rare- beer   Objective:   BP (!) 176/68 (BP Location: Right Arm, Cuff Size: Normal)   Pulse (!) 48   Temp 97.9 F (36.6 C) (Oral)   Resp 18   Wt 169 lb (76.7 kg)   SpO2 96% Comment: room  air  BMI 27.28 kg/m  There were no vitals filed for this visit.   Physical Exam   General Appearance:    Alert, cooperative, no distress  Eyes:    PERRL, conjunctiva/corneas clear, EOM's intact       Lungs:     Clear to auscultation bilaterally, respirations unlabored  Heart:   Bradycardic, irregular. Weak pedal pulses bilaterally.   Neurologic:   Awake, alert, oriented x 3. No apparent focal neurological           defect.       EKG: first degree AV block. Prequent PVCs.  Results for orders placed or performed in visit on 09/30/16  POCT HgB A1C  Result Value Ref Range   Hemoglobin A1C 7.9     Est. average glucose Bld gHb Est-mCnc 180        Assessment & Plan:     1. Type 2 diabetes mellitus without complication, unspecified whether long term insulin use (HCC) Stable on current medications. Not candidate for SGLT2-I at this point due to low eGFR. Continue current medications.   - POCT HgB A1C - Comprehensive metabolic panel  2. Essential (primary) hypertension Usually much better controlled. He did not take his BP medications this morning like he usually does. Continue current medications for now and monitor BP at home.  - EKG 12-Lead - Comprehensive metabolic panel  3. Arteriosclerosis of coronary artery Asymptomatic. Compliant with medication.  Continue aggressive risk factor modification.  He has Myoview and  echo scheduled by Dr. Saralyn Pilar later this month.   4. Intermittent claudication (HCC) Schedule ABIs.   5. PVC (premature ventricular contraction) Is on beta-blocker, but has not taken since yesterday morning.  - Magnesium - TSH  6. Hypertriglyceridemia Doing well with increased dose of atorvastatin.  - Comprehensive metabolic panel - Lipid panel  Return in about 4 months (around 01/30/2017), or diabetes.        Lelon Huh, MD  [facility]

## 2016-10-01 LAB — TSH: TSH: 2.66 u[IU]/mL (ref 0.450–4.500)

## 2016-10-01 LAB — COMPREHENSIVE METABOLIC PANEL
A/G RATIO: 1.3 (ref 1.2–2.2)
ALBUMIN: 3.5 g/dL (ref 3.5–4.8)
ALK PHOS: 110 IU/L (ref 39–117)
ALT: 19 IU/L (ref 0–44)
AST: 17 IU/L (ref 0–40)
BUN / CREAT RATIO: 11 (ref 10–24)
BUN: 21 mg/dL (ref 8–27)
CHLORIDE: 106 mmol/L (ref 96–106)
CO2: 22 mmol/L (ref 20–29)
Calcium: 9.4 mg/dL (ref 8.6–10.2)
Creatinine, Ser: 1.9 mg/dL — ABNORMAL HIGH (ref 0.76–1.27)
GFR calc non Af Amer: 34 mL/min/{1.73_m2} — ABNORMAL LOW (ref >59)
GFR, EST AFRICAN AMERICAN: 40 mL/min/{1.73_m2} — AB (ref >59)
Globulin, Total: 2.6 g/dL (ref 1.5–4.5)
Glucose: 167 mg/dL — ABNORMAL HIGH (ref 65–99)
POTASSIUM: 5.3 mmol/L — AB (ref 3.5–5.2)
Sodium: 139 mmol/L (ref 134–144)
TOTAL PROTEIN: 6.1 g/dL (ref 6.0–8.5)

## 2016-10-01 LAB — LIPID PANEL
Chol/HDL Ratio: 5.9 ratio — ABNORMAL HIGH (ref 0.0–5.0)
Cholesterol, Total: 212 mg/dL — ABNORMAL HIGH (ref 100–199)
HDL: 36 mg/dL — ABNORMAL LOW (ref >39)
LDL Calculated: 112 mg/dL — ABNORMAL HIGH (ref 0–99)
Triglycerides: 322 mg/dL — ABNORMAL HIGH (ref 0–149)
VLDL Cholesterol Cal: 64 mg/dL — ABNORMAL HIGH (ref 5–40)

## 2016-10-01 LAB — MAGNESIUM: MAGNESIUM: 1.8 mg/dL (ref 1.6–2.3)

## 2016-10-03 NOTE — Progress Notes (Signed)
Advised  ED 

## 2016-10-10 ENCOUNTER — Encounter: Payer: Self-pay | Admitting: Family Medicine

## 2016-10-10 DIAGNOSIS — I25810 Atherosclerosis of coronary artery bypass graft(s) without angina pectoris: Secondary | ICD-10-CM | POA: Diagnosis not present

## 2016-10-10 DIAGNOSIS — R0602 Shortness of breath: Secondary | ICD-10-CM | POA: Diagnosis not present

## 2016-10-10 DIAGNOSIS — R600 Localized edema: Secondary | ICD-10-CM | POA: Diagnosis not present

## 2016-10-12 ENCOUNTER — Telehealth: Payer: Self-pay | Admitting: *Deleted

## 2016-10-12 DIAGNOSIS — R6889 Other general symptoms and signs: Secondary | ICD-10-CM

## 2016-10-12 DIAGNOSIS — I739 Peripheral vascular disease, unspecified: Secondary | ICD-10-CM

## 2016-10-12 NOTE — Telephone Encounter (Signed)
Patient called office concerning referral to vein specialist? Patient stated he spoke to provider about this at [facility] 09/30/2016. Please advise? I saw in ov note about scheduling ABIs but nothing about a referral.

## 2016-10-13 DIAGNOSIS — R6889 Other general symptoms and signs: Secondary | ICD-10-CM | POA: Insufficient documentation

## 2016-10-13 NOTE — Telephone Encounter (Signed)
Referral has been ordered, he should here from sarah about this in the next day or tow.

## 2016-10-14 DIAGNOSIS — I10 Essential (primary) hypertension: Secondary | ICD-10-CM | POA: Diagnosis not present

## 2016-10-14 DIAGNOSIS — I25810 Atherosclerosis of coronary artery bypass graft(s) without angina pectoris: Secondary | ICD-10-CM | POA: Diagnosis not present

## 2016-10-14 DIAGNOSIS — R600 Localized edema: Secondary | ICD-10-CM | POA: Diagnosis not present

## 2016-10-14 DIAGNOSIS — E782 Mixed hyperlipidemia: Secondary | ICD-10-CM | POA: Diagnosis not present

## 2016-10-14 DIAGNOSIS — R0602 Shortness of breath: Secondary | ICD-10-CM | POA: Diagnosis not present

## 2016-10-17 DIAGNOSIS — I25798 Atherosclerosis of other coronary artery bypass graft(s) with other forms of angina pectoris: Secondary | ICD-10-CM | POA: Diagnosis not present

## 2016-10-18 ENCOUNTER — Encounter
Admission: RE | Disposition: A | Discharge status: Home / Self Care | Payer: Self-pay | Source: Ambulatory Visit | Attending: Cardiology

## 2016-10-18 ENCOUNTER — Ambulatory Visit
Admission: RE | Admit: 2016-10-18 | Discharge: 2016-10-18 | Disposition: A | Discharge status: Home / Self Care | Payer: Medicare Other | Source: Ambulatory Visit | Attending: Cardiology | Admitting: Cardiology

## 2016-10-18 DIAGNOSIS — R0602 Shortness of breath: Secondary | ICD-10-CM | POA: Diagnosis not present

## 2016-10-18 DIAGNOSIS — Z79899 Other long term (current) drug therapy: Secondary | ICD-10-CM | POA: Insufficient documentation

## 2016-10-18 DIAGNOSIS — I2584 Coronary atherosclerosis due to calcified coronary lesion: Secondary | ICD-10-CM | POA: Insufficient documentation

## 2016-10-18 DIAGNOSIS — Z7984 Long term (current) use of oral hypoglycemic drugs: Secondary | ICD-10-CM | POA: Insufficient documentation

## 2016-10-18 DIAGNOSIS — I10 Essential (primary) hypertension: Secondary | ICD-10-CM | POA: Insufficient documentation

## 2016-10-18 DIAGNOSIS — Z87891 Personal history of nicotine dependence: Secondary | ICD-10-CM | POA: Insufficient documentation

## 2016-10-18 DIAGNOSIS — E1151 Type 2 diabetes mellitus with diabetic peripheral angiopathy without gangrene: Secondary | ICD-10-CM | POA: Insufficient documentation

## 2016-10-18 DIAGNOSIS — Z7982 Long term (current) use of aspirin: Secondary | ICD-10-CM | POA: Diagnosis not present

## 2016-10-18 DIAGNOSIS — E782 Mixed hyperlipidemia: Secondary | ICD-10-CM | POA: Diagnosis not present

## 2016-10-18 DIAGNOSIS — I255 Ischemic cardiomyopathy: Secondary | ICD-10-CM | POA: Insufficient documentation

## 2016-10-18 DIAGNOSIS — I420 Dilated cardiomyopathy: Secondary | ICD-10-CM | POA: Diagnosis not present

## 2016-10-18 DIAGNOSIS — R609 Edema, unspecified: Secondary | ICD-10-CM | POA: Diagnosis not present

## 2016-10-18 DIAGNOSIS — I2510 Atherosclerotic heart disease of native coronary artery without angina pectoris: Secondary | ICD-10-CM | POA: Diagnosis not present

## 2016-10-18 HISTORY — PX: LEFT HEART CATH AND CORONARY ANGIOGRAPHY: CATH118249

## 2016-10-18 LAB — GLUCOSE, CAPILLARY
Glucose-Capillary: 169 mg/dL — ABNORMAL HIGH (ref 65–99)
Glucose-Capillary: 127 mg/dL — ABNORMAL HIGH (ref 65–99)

## 2016-10-18 SURGERY — LEFT HEART CATH AND CORONARY ANGIOGRAPHY
Anesthesia: Moderate Sedation

## 2016-10-18 MED ORDER — FENTANYL CITRATE (PF) 100 MCG/2ML IJ SOLN
INTRAMUSCULAR | Status: DC | PRN
  Administered 2016-10-18: 25 ug via INTRAVENOUS

## 2016-10-18 MED ORDER — SODIUM CHLORIDE 0.9% FLUSH: 3.0000 mL | Freq: Two times a day (BID) | INTRAVENOUS | Status: DC

## 2016-10-18 MED ORDER — SODIUM CHLORIDE 0.9 % IV SOLN: INTRAVENOUS | Status: DC

## 2016-10-18 MED ORDER — SODIUM CHLORIDE 0.9% FLUSH: 3.0000 mL | INTRAVENOUS | Status: DC | PRN

## 2016-10-18 MED ORDER — SODIUM CHLORIDE 0.9 % IV SOLN
INTRAVENOUS | Status: DC
  Administered 2016-10-18: 12:00:00 via INTRAVENOUS

## 2016-10-18 MED ORDER — FENTANYL CITRATE (PF) 100 MCG/2ML IJ SOLN
INTRAMUSCULAR | Status: AC
  Filled 2016-10-18: qty 2

## 2016-10-18 MED ORDER — ONDANSETRON HCL 4 MG/2ML IJ SOLN: 4.0000 mg | Freq: Four times a day (QID) | INTRAMUSCULAR | Status: DC | PRN

## 2016-10-18 MED ORDER — MIDAZOLAM HCL 2 MG/2ML IJ SOLN
INTRAMUSCULAR | Status: AC
  Filled 2016-10-18: qty 2

## 2016-10-18 MED ORDER — SODIUM CHLORIDE 0.9 % IV SOLN: 250.0000 mL | INTRAVENOUS | Status: DC | PRN

## 2016-10-18 MED ORDER — HEPARIN (PORCINE) IN NACL 2-0.9 UNIT/ML-% IJ SOLN
INTRAMUSCULAR | Status: AC
  Filled 2016-10-18: qty 500

## 2016-10-18 MED ORDER — ASPIRIN 81 MG PO CHEW
81.0000 mg | CHEWABLE_TABLET | ORAL | Status: AC
  Administered 2016-10-18: 81 mg via ORAL

## 2016-10-18 MED ORDER — MIDAZOLAM HCL 2 MG/2ML IJ SOLN
INTRAMUSCULAR | Status: DC | PRN
  Administered 2016-10-18: 1 mg via INTRAVENOUS

## 2016-10-18 MED ORDER — SODIUM CHLORIDE 0.9 % WEIGHT BASED INFUSION: 1.0000 mL/kg/h | INTRAVENOUS | Status: DC

## 2016-10-18 MED ORDER — ACETAMINOPHEN 325 MG PO TABS: 650.0000 mg | ORAL_TABLET | ORAL | Status: DC | PRN

## 2016-10-18 MED ORDER — ASPIRIN 81 MG PO CHEW
CHEWABLE_TABLET | ORAL | Status: AC
  Filled 2016-10-18: qty 1

## 2016-10-18 MED ORDER — IOPAMIDOL (ISOVUE-300) INJECTION 61%
INTRAVENOUS | Status: DC | PRN
  Administered 2016-10-18: 105 mL via INTRA_ARTERIAL

## 2016-10-18 MED ORDER — SODIUM CHLORIDE 0.9 % IV SOLN
INTRAVENOUS | Status: AC | PRN
  Administered 2016-10-18: 150 mL/h via INTRAVENOUS

## 2016-10-18 SURGICAL SUPPLY — 9 items
CATH INFINITI 5FR ANG PIGTAIL (CATHETERS) ×3 IMPLANT
CATH INFINITI 5FR JL4 (CATHETERS) ×3 IMPLANT
CATH INFINITI JR4 5F (CATHETERS) ×3 IMPLANT
DEVICE CLOSURE MYNXGRIP 5F (Vascular Products) ×3 IMPLANT
KIT MANI 3VAL PERCEP (MISCELLANEOUS) ×3 IMPLANT
NEEDLE PERC 18GX7CM (NEEDLE) ×3 IMPLANT
PACK CARDIAC CATH (CUSTOM PROCEDURE TRAY) ×3 IMPLANT
SHEATH AVANTI 5FR X 11CM (SHEATH) ×3 IMPLANT
WIRE EMERALD 3MM-J .035X150CM (WIRE) ×3 IMPLANT

## 2016-10-18 NOTE — Progress Notes (Signed)
Dr. Saralyn Pilar in at bedside to speak with pt. And family re: cath results. All verbalized understanding.

## 2016-10-20 ENCOUNTER — Encounter: Payer: Self-pay | Admitting: Cardiology

## 2016-10-25 ENCOUNTER — Other Ambulatory Visit: Payer: Self-pay | Admitting: Family Medicine

## 2016-10-25 ENCOUNTER — Encounter (INDEPENDENT_AMBULATORY_CARE_PROVIDER_SITE_OTHER): Payer: Self-pay

## 2016-10-25 ENCOUNTER — Ambulatory Visit (INDEPENDENT_AMBULATORY_CARE_PROVIDER_SITE_OTHER): Payer: Medicare Other | Admitting: Vascular Surgery

## 2016-10-25 ENCOUNTER — Encounter (INDEPENDENT_AMBULATORY_CARE_PROVIDER_SITE_OTHER): Payer: Self-pay | Admitting: Vascular Surgery

## 2016-10-25 VITALS — BP 175/86 | HR 59 | Resp 17 | Ht 66.0 in | Wt 166.4 lb

## 2016-10-25 DIAGNOSIS — I25810 Atherosclerosis of coronary artery bypass graft(s) without angina pectoris: Secondary | ICD-10-CM

## 2016-10-25 DIAGNOSIS — E785 Hyperlipidemia, unspecified: Secondary | ICD-10-CM

## 2016-10-25 DIAGNOSIS — I10 Essential (primary) hypertension: Secondary | ICD-10-CM

## 2016-10-25 DIAGNOSIS — E119 Type 2 diabetes mellitus without complications: Secondary | ICD-10-CM

## 2016-10-25 DIAGNOSIS — I739 Peripheral vascular disease, unspecified: Secondary | ICD-10-CM | POA: Diagnosis not present

## 2016-10-25 NOTE — Assessment & Plan Note (Signed)
blood pressure control important in reducing the progression of atherosclerotic disease. On appropriate oral medications.  

## 2016-10-25 NOTE — Patient Instructions (Signed)

## 2016-10-25 NOTE — Assessment & Plan Note (Signed)
blood glucose control important in reducing the progression of atherosclerotic disease. Also, involved in wound healing. On appropriate medications.  

## 2016-10-25 NOTE — Assessment & Plan Note (Signed)
Recommend:  The patient has experienced increased symptoms and is now describing lifestyle limiting claudication and mild rest pain. His cardiologist performed ABIs which were marked bilaterally.  Given the severity of the patient's lower extremity symptoms the patient should undergo angiography and intervention.  Risk and benefits were reviewed the patient.  Indications for the procedure were reviewed.  All questions were answered, the patient agrees to proceed.  He desires to have his right leg treated first, followed by left lower extremity intervention in several weeks. The patient should continue walking and begin a more formal exercise program.  The patient should continue antiplatelet therapy and aggressive treatment of the lipid abnormalities  The patient will follow up with me after the angiogram.

## 2016-10-25 NOTE — Assessment & Plan Note (Signed)
lipid control important in reducing the progression of atherosclerotic disease. Continue statin therapy  

## 2016-10-25 NOTE — Progress Notes (Signed)
Patient ID: Thomas [name], male   DOB: 12-Sep-1945, 70 y.o.   MRN: [id number]  Chief Complaint  Patient presents with  . New Evaluation    claudication    HPI Thomas [name] is a 71 y.o. male.  I am asked to see the patient by Dr. Saralyn Pilar for evaluation of PAD.  The patient reports pain and cramping in his lower legs which has steadily worsened over the past 6 months to a year. It started in his right lower extremity, but now he says the lower extremities are affected about the same. After less than 100 feet of walking, his legs generally began to cramp entire. Some days he says he is able to walk longer distances but other days he can walk only very short distances. He has significant cardiac disease and underwent a recent cardiac catheterization which showed significant disease as well as reduced ejection fraction. His cardiologist also ordered noninvasive studies which showed significant reduction in his ABIs bilaterally consistent with severe peripheral vascular disease. He says he does no sometimes pain in the ball of his foot and pain in his feet when he keeps them elevated. He does not have ulceration or infection. He denies fever or chills.   Past Medical History:  Diagnosis Date  . Allergy   . CAD (coronary artery disease)   . Diabetes mellitus without complication ([place])   . Erosive esophagitis   . Gouty arthropathy   . History of colonic polyps   . History of MRSA infection   . Hyperkalemia   . Hyperlipidemia   . Hypertension   . Melanoma ([place])   . 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 Trigger finger of left hand   . Ureteral tumor     Past Surgical History:  Procedure Laterality Date  . CATARACT EXTRACTION    . CORONARY ARTERY BYPASS GRAFT  1996  . EXCISION Ureteral Tumor    . LEFT HEART CATH AND CORONARY ANGIOGRAPHY N/A 10/18/2016   Procedure: Left Heart Cath and Coronary Angiography;  Surgeon: Isaias Cowman, MD;  Location: [place] [facility];  Service: Cardiovascular;  Laterality: N/A;    Family History  Problem Relation Age of Onset  . Alzheimer's disease Mother   . Alzheimer's disease Brother   . Lung cancer Paternal Grandfather   No bleeding or clotting disorders  Social History Quit smoking about 10 years ago. No significant alcohol use No IVDU Married, wife accompanies him today  Allergies  Allergen Reactions  . Ciprofloxacin Itching and Swelling    Facial swelling     Current Outpatient Prescriptions  Medication Sig Dispense Refill  . acetaminophen (TYLENOL) 500 MG tablet Take 500 mg by mouth every 8 (eight) hours as needed for mild pain or headache.    Marland Kitchen aspirin EC 81 MG tablet Take 81 mg by mouth daily.    Marland Kitchen atenolol (TENORMIN) 25 MG tablet TAKE ONE (1) TABLET BY MOUTH EVERY DAY 30 tablet 12  . atorvastatin (LIPITOR) 80 MG tablet TAKE ONE (1) TABLET BY MOUTH EVERY DAY (Patient taking differently: TAKE ONE (1) TABLET BY MOUTH EVERY IN EVENING) 30 tablet 5  . colchicine 0.6 MG tablet Take 1 tablet (0.6 mg total) by mouth daily. As needed for gout 30 tablet 5  . Cyanocobalamin (VITAMIN B-12) 5000 MCG SUBL Place 1 tablet under the tongue daily.    Marland Kitchen glipiZIDE (GLUCOTROL XL) 10 MG 24 hr tablet TAKE ONE (1) TABLET  BY MOUTH EVERY DAY WITH BREAKFAST 30 tablet 12  . glucose blood test strip     . lisinopril-hydrochlorothiazide (PRINZIDE,ZESTORETIC) 20-12.5 MG tablet TAKE ONE (1) TABLET BY MOUTH EVERY DAY 30 tablet 12  . metFORMIN (GLUCOPHAGE-XR) 500 MG 24 hr tablet TAKE TWO (2) TABLETS BY MOUTH DAILY 60 tablet 5  . pantoprazole (PROTONIX) 40 MG tablet Take 1 tablet (40 mg total) by mouth daily. 30 tablet 12  . buPROPion (WELLBUTRIN SR) 150 MG 12 hr tablet 1 tablet daily for 3 days, then 1 tablet twice daily. Stop smoking 14 days after starting medication (Patient not taking: Reported on 10/14/2016) 60 tablet 5   No current facility-administered medications for this visit.       REVIEW OF SYSTEMS  (Negative unless checked)  Constitutional: [] Weight loss  [] Fever  [] Chills Cardiac: [] Chest pain   [] Chest pressure   [] Palpitations   [] Shortness of breath when laying flat   [] Shortness of breath at rest   [x] Shortness of breath with exertion. Vascular:  [x] Pain in legs with walking   [] Pain in legs at rest   [] Pain in legs when laying flat   [x] Claudication   [x] Pain in feet when walking  [] Pain in feet at rest  [] Pain in feet when laying flat   [] History of DVT   [] Phlebitis   [] Swelling in legs   [] Varicose veins   [] Non-healing ulcers Pulmonary:   [] Uses home oxygen   [] Productive cough   [] Hemoptysis   [] Wheeze  [] COPD   [] Asthma Neurologic:  [] Dizziness  [] Blackouts   [] Seizures   [] History of stroke   [] History of TIA  [] Aphasia   [] Temporary blindness   [] Dysphagia   [] Weakness or numbness in arms   [] Weakness or numbness in legs Musculoskeletal:  [x] Arthritis   [] Joint swelling   [] Joint pain   [] Low back pain Hematologic:  [] Easy bruising  [] Easy bleeding   [] Hypercoagulable state   [] Anemic  [] Hepatitis Gastrointestinal:  [] Blood in stool   [] Vomiting blood  [] Gastroesophageal reflux/heartburn   [] Abdominal pain Genitourinary:  [] Chronic kidney disease   [] Difficult urination  [] Frequent urination  [] Burning with urination   [] Hematuria Skin:  [] Rashes   [] Ulcers   [] Wounds Psychological:  [] History of anxiety   []  History of major depression.    Physical Exam BP (!) 175/86   Pulse (!) 59   Resp 17   Ht 5\' 6"  (1.676 m)   Wt 166 lb 6.4 oz (75.5 kg)   BMI 26.86 kg/m  Gen:  WD/WN, NAD Head: [place]/AT, No temporalis wasting.  Ear/Nose/Throat: Hearing grossly intact, nares w/o erythema or drainage, oropharynx w/o Erythema/Exudate Eyes: Conjunctiva clear, sclera non-icteric  Neck: trachea midline.  No JVD.  Pulmonary:  Good air movement, respirations not labored, no use of accessory muscles  Cardiac: irregular Vascular:  Vessel Right Left  Radial Palpable Palpable                        Popliteal Not Palpable Not Palpable  PT 1+ Palpable Trace Palpable  DP Not Palpable Not Palpable   Gastrointestinal: soft, non-tender/non-distended. Musculoskeletal: M/S 5/5 throughout. No deformity or atrophy. Trace bilateral lower extremity edema. Neurologic: Sensation grossly intact in extremities.  Symmetrical.  Speech is fluent. Motor exam as listed above. Psychiatric: Judgment intact, Mood & affect appropriate for pt's clinical situation. Dermatologic: No rashes or ulcers noted.  No cellulitis or open wounds. Lymph : No Cervical, Axillary, or Inguinal lymphadenopathy.   Radiology No results found.  Labs Recent Results (from the past 2160 hour(s))  Lipid panel     Status: Abnormal   Collection Time: 08/10/16  8:28 AM  Result Value Ref Range   Cholesterol, Total 200 (H) 100 - 199 mg/dL   Triglycerides 315 (H) 0 - 149 mg/dL   HDL 36 (L) >39 mg/dL   VLDL Cholesterol Cal 63 (H) 5 - 40 mg/dL   LDL Calculated 101 (H) 0 - 99 mg/dL   Chol/HDL Ratio 5.6 (H) 0.0 - 5.0 ratio    Comment:                                   T. Chol/HDL Ratio                                             Men  Women                               1/2 Avg.Risk  3.4    3.3                                   Avg.Risk  5.0    4.4                                2X Avg.Risk  9.6    7.1                                3X Avg.Risk 23.4   11.0   POCT HgB A1C     Status: None   Collection Time: 09/30/16  8:25 AM  Result Value Ref Range   Hemoglobin A1C 7.9    Est. average glucose Bld gHb Est-mCnc 180   Comprehensive metabolic panel     Status: Abnormal   Collection Time: 09/30/16  9:13 AM  Result Value Ref Range   Glucose 167 (H) 65 - 99 mg/dL   BUN 21 8 - 27 mg/dL   Creatinine, Ser 1.90 (H) 0.76 - 1.27 mg/dL   GFR calc non Af Amer 34 (L) >59 mL/min/1.73   GFR calc Af Amer 40 (L) >59 mL/min/1.73   BUN/Creatinine Ratio 11 10 - 24   Sodium 139 134 - 144 mmol/L   Potassium 5.3 (H) 3.5 - 5.2 mmol/L    Chloride 106 96 - 106 mmol/L   CO2 22 20 - 29 mmol/L    Comment:               **Please note reference interval change**   Calcium 9.4 8.6 - 10.2 mg/dL   Total Protein 6.1 6.0 - 8.5 g/dL   Albumin 3.5 3.5 - 4.8 g/dL   Globulin, Total 2.6 1.5 - 4.5 g/dL   Albumin/Globulin Ratio 1.3 1.2 - 2.2   Bilirubin Total <0.2 0.0 - 1.2 mg/dL   Alkaline Phosphatase 110 39 - 117 IU/L   AST 17 0 - 40 IU/L   ALT 19 0 - 44 IU/L  Lipid panel     Status: Abnormal   Collection Time: 09/30/16  9:13 AM  Result Value Ref Range   Cholesterol, Total 212 (H) 100 - 199 mg/dL   Triglycerides 322 (H) 0 - 149 mg/dL   HDL 36 (L) >39 mg/dL   VLDL Cholesterol Cal 64 (H) 5 - 40 mg/dL   LDL Calculated 112 (H) 0 - 99 mg/dL   Chol/HDL Ratio 5.9 (H) 0.0 - 5.0 ratio    Comment:                                   T. Chol/HDL Ratio                                             Men  Women                               1/2 Avg.Risk  3.4    3.3                                   Avg.Risk  5.0    4.4                                2X Avg.Risk  9.6    7.1                                3X Avg.Risk 23.4   11.0   Magnesium     Status: None   Collection Time: 09/30/16  9:13 AM  Result Value Ref Range   Magnesium 1.8 1.6 - 2.3 mg/dL  TSH     Status: None   Collection Time: 09/30/16  9:13 AM  Result Value Ref Range   TSH 2.660 0.450 - 4.500 uIU/mL  Glucose, capillary     Status: Abnormal   Collection Time: 10/18/16 11:20 AM  Result Value Ref Range   Glucose-Capillary 169 (H) 65 - 99 mg/dL  Glucose, capillary     Status: Abnormal   Collection Time: 10/18/16  1:51 PM  Result Value Ref Range   Glucose-Capillary 127 (H) 65 - 99 mg/dL    Assessment/Plan:  Essential (primary) hypertension blood pressure control important in reducing the progression of atherosclerotic disease. On appropriate oral medications.   Diabetes blood glucose control important in reducing the progression of atherosclerotic disease. Also, involved in  wound healing. On appropriate medications.   Hyperlipidemia lipid control important in reducing the progression of atherosclerotic disease. Continue statin therapy   CAD (coronary artery disease) of artery bypass graft Recent cardiac catheterization demonstrated significant coronary disease as well as a markedly reduced ejection fraction. He would certainly be high risk for any open surgical procedure, but should be able to tolerate an angiogram with moderate sedation.  Intermittent claudication (HCC) Recommend:  The patient has experienced increased symptoms and is now describing lifestyle limiting claudication and mild rest pain. His cardiologist performed ABIs which were marked bilaterally.  Given the severity of the patient's lower extremity symptoms the patient should undergo angiography and intervention.  Risk and benefits were reviewed the patient.  Indications for the procedure were reviewed.  All questions were  answered, the patient agrees to proceed.  He desires to have his right leg treated first, followed by left lower extremity intervention in several weeks. The patient should continue walking and begin a more formal exercise program.  The patient should continue antiplatelet therapy and aggressive treatment of the lipid abnormalities  The patient will follow up with me after the angiogram.       Leotis Pain 10/25/2016, 3:35 PM   This note was created with Dragon medical transcription system.  Any errors from dictation are unintentional.

## 2016-10-25 NOTE — Assessment & Plan Note (Signed)
Recent cardiac catheterization demonstrated significant coronary disease as well as a markedly reduced ejection fraction. He would certainly be high risk for any open surgical procedure, but should be able to tolerate an angiogram with moderate sedation.

## 2016-10-26 ENCOUNTER — Other Ambulatory Visit (INDEPENDENT_AMBULATORY_CARE_PROVIDER_SITE_OTHER): Payer: Self-pay

## 2016-10-28 ENCOUNTER — Encounter
Admission: RE | Admit: 2016-10-28 | Discharge: 2016-10-28 | Disposition: A | Discharge status: Home / Self Care | Payer: Medicare Other | Source: Ambulatory Visit | Attending: Vascular Surgery | Admitting: Vascular Surgery

## 2016-10-28 DIAGNOSIS — Z820 Family history of epilepsy and other diseases of the nervous system: Secondary | ICD-10-CM | POA: Diagnosis not present

## 2016-10-28 DIAGNOSIS — I70213 Atherosclerosis of native arteries of extremities with intermittent claudication, bilateral legs: Secondary | ICD-10-CM | POA: Diagnosis not present

## 2016-10-28 DIAGNOSIS — Z87891 Personal history of nicotine dependence: Secondary | ICD-10-CM | POA: Diagnosis not present

## 2016-10-28 DIAGNOSIS — E875 Hyperkalemia: Secondary | ICD-10-CM | POA: Diagnosis not present

## 2016-10-28 DIAGNOSIS — Z86010 Personal history of colonic polyps: Secondary | ICD-10-CM | POA: Diagnosis not present

## 2016-10-28 DIAGNOSIS — Z801 Family history of malignant neoplasm of trachea, bronchus and lung: Secondary | ICD-10-CM | POA: Diagnosis not present

## 2016-10-28 DIAGNOSIS — M6530 Trigger finger, unspecified finger: Secondary | ICD-10-CM | POA: Diagnosis not present

## 2016-10-28 DIAGNOSIS — K2210 Ulcer of esophagus without bleeding: Secondary | ICD-10-CM | POA: Diagnosis not present

## 2016-10-28 DIAGNOSIS — Z951 Presence of aortocoronary bypass graft: Secondary | ICD-10-CM | POA: Diagnosis not present

## 2016-10-28 DIAGNOSIS — Z7984 Long term (current) use of oral hypoglycemic drugs: Secondary | ICD-10-CM | POA: Diagnosis not present

## 2016-10-28 DIAGNOSIS — I10 Essential (primary) hypertension: Secondary | ICD-10-CM | POA: Diagnosis not present

## 2016-10-28 DIAGNOSIS — Z85828 Personal history of other malignant neoplasm of skin: Secondary | ICD-10-CM | POA: Diagnosis not present

## 2016-10-28 DIAGNOSIS — Z7982 Long term (current) use of aspirin: Secondary | ICD-10-CM | POA: Diagnosis not present

## 2016-10-28 DIAGNOSIS — Z8614 Personal history of Methicillin resistant Staphylococcus aureus infection: Secondary | ICD-10-CM | POA: Diagnosis not present

## 2016-10-28 DIAGNOSIS — Z881 Allergy status to other antibiotic agents status: Secondary | ICD-10-CM | POA: Diagnosis not present

## 2016-10-28 DIAGNOSIS — Z955 Presence of coronary angioplasty implant and graft: Secondary | ICD-10-CM | POA: Diagnosis not present

## 2016-10-28 DIAGNOSIS — E785 Hyperlipidemia, unspecified: Secondary | ICD-10-CM | POA: Diagnosis not present

## 2016-10-28 DIAGNOSIS — Z9849 Cataract extraction status, unspecified eye: Secondary | ICD-10-CM | POA: Diagnosis not present

## 2016-10-28 DIAGNOSIS — Z9889 Other specified postprocedural states: Secondary | ICD-10-CM | POA: Diagnosis not present

## 2016-10-28 DIAGNOSIS — E119 Type 2 diabetes mellitus without complications: Secondary | ICD-10-CM | POA: Diagnosis not present

## 2016-10-28 HISTORY — DX: Unspecified osteoarthritis, unspecified site: M19.90

## 2016-10-28 HISTORY — DX: Gastro-esophageal reflux disease without esophagitis: K21.9

## 2016-10-28 HISTORY — DX: Acute myocardial infarction, unspecified: I21.9

## 2016-10-28 HISTORY — DX: Peripheral vascular disease, unspecified: I73.9

## 2016-10-28 HISTORY — DX: Personal history of urinary calculi: Z87.442

## 2016-10-28 LAB — BASIC METABOLIC PANEL
ANION GAP: 7 (ref 5–15)
BUN: 28 mg/dL — ABNORMAL HIGH (ref 6–20)
CALCIUM: 8.8 mg/dL — AB (ref 8.9–10.3)
CO2: 24 mmol/L (ref 22–32)
CREATININE: 1.9 mg/dL — AB (ref 0.61–1.24)
Chloride: 106 mmol/L (ref 101–111)
GFR calc Af Amer: 39 mL/min — ABNORMAL LOW (ref >60)
GFR calc non Af Amer: 34 mL/min — ABNORMAL LOW (ref >60)
GLUCOSE: 175 mg/dL — AB (ref 65–99)
Potassium: 5.4 mmol/L — ABNORMAL HIGH (ref 3.5–5.1)
Sodium: 137 mmol/L (ref 135–145)

## 2016-10-30 MED ORDER — CEFAZOLIN SODIUM-DEXTROSE 1-4 GM/50ML-% IV SOLN
1.0000 g | Freq: Once | INTRAVENOUS | Status: AC
  Administered 2016-10-31: 1 g via INTRAVENOUS

## 2016-10-31 ENCOUNTER — Encounter
Admission: RE | Disposition: A | Discharge status: Home / Self Care | Payer: Self-pay | Source: Ambulatory Visit | Attending: Vascular Surgery

## 2016-10-31 ENCOUNTER — Ambulatory Visit
Admission: RE | Admit: 2016-10-31 | Discharge: 2016-10-31 | Disposition: A | Discharge status: Home / Self Care | Payer: Medicare Other | Source: Ambulatory Visit | Attending: Vascular Surgery | Admitting: Vascular Surgery

## 2016-10-31 DIAGNOSIS — Z8614 Personal history of Methicillin resistant Staphylococcus aureus infection: Secondary | ICD-10-CM | POA: Insufficient documentation

## 2016-10-31 DIAGNOSIS — I70212 Atherosclerosis of native arteries of extremities with intermittent claudication, left leg: Secondary | ICD-10-CM | POA: Diagnosis not present

## 2016-10-31 DIAGNOSIS — M6530 Trigger finger, unspecified finger: Secondary | ICD-10-CM | POA: Diagnosis not present

## 2016-10-31 DIAGNOSIS — K2210 Ulcer of esophagus without bleeding: Secondary | ICD-10-CM | POA: Diagnosis not present

## 2016-10-31 DIAGNOSIS — I70213 Atherosclerosis of native arteries of extremities with intermittent claudication, bilateral legs: Secondary | ICD-10-CM | POA: Insufficient documentation

## 2016-10-31 DIAGNOSIS — Z9849 Cataract extraction status, unspecified eye: Secondary | ICD-10-CM | POA: Diagnosis not present

## 2016-10-31 DIAGNOSIS — E119 Type 2 diabetes mellitus without complications: Secondary | ICD-10-CM | POA: Insufficient documentation

## 2016-10-31 DIAGNOSIS — Z951 Presence of aortocoronary bypass graft: Secondary | ICD-10-CM | POA: Insufficient documentation

## 2016-10-31 DIAGNOSIS — Z801 Family history of malignant neoplasm of trachea, bronchus and lung: Secondary | ICD-10-CM | POA: Diagnosis not present

## 2016-10-31 DIAGNOSIS — E785 Hyperlipidemia, unspecified: Secondary | ICD-10-CM | POA: Diagnosis not present

## 2016-10-31 DIAGNOSIS — Z7982 Long term (current) use of aspirin: Secondary | ICD-10-CM | POA: Insufficient documentation

## 2016-10-31 DIAGNOSIS — Z9889 Other specified postprocedural states: Secondary | ICD-10-CM | POA: Diagnosis not present

## 2016-10-31 DIAGNOSIS — Z7984 Long term (current) use of oral hypoglycemic drugs: Secondary | ICD-10-CM | POA: Insufficient documentation

## 2016-10-31 DIAGNOSIS — Z955 Presence of coronary angioplasty implant and graft: Secondary | ICD-10-CM | POA: Insufficient documentation

## 2016-10-31 DIAGNOSIS — Z881 Allergy status to other antibiotic agents status: Secondary | ICD-10-CM | POA: Insufficient documentation

## 2016-10-31 DIAGNOSIS — Z87891 Personal history of nicotine dependence: Secondary | ICD-10-CM | POA: Insufficient documentation

## 2016-10-31 DIAGNOSIS — Z86010 Personal history of colonic polyps: Secondary | ICD-10-CM | POA: Insufficient documentation

## 2016-10-31 DIAGNOSIS — Z85828 Personal history of other malignant neoplasm of skin: Secondary | ICD-10-CM | POA: Insufficient documentation

## 2016-10-31 DIAGNOSIS — I10 Essential (primary) hypertension: Secondary | ICD-10-CM | POA: Insufficient documentation

## 2016-10-31 DIAGNOSIS — Z820 Family history of epilepsy and other diseases of the nervous system: Secondary | ICD-10-CM | POA: Diagnosis not present

## 2016-10-31 DIAGNOSIS — E875 Hyperkalemia: Secondary | ICD-10-CM | POA: Insufficient documentation

## 2016-10-31 HISTORY — PX: LOWER EXTREMITY ANGIOGRAPHY: CATH118251

## 2016-10-31 LAB — GLUCOSE, CAPILLARY
GLUCOSE-CAPILLARY: 195 mg/dL — AB (ref 65–99)
Glucose-Capillary: 186 mg/dL — ABNORMAL HIGH (ref 65–99)

## 2016-10-31 SURGERY — LOWER EXTREMITY ANGIOGRAPHY
Anesthesia: Moderate Sedation | Laterality: Right

## 2016-10-31 MED ORDER — PHENOL 1.4 % MT LIQD
1.0000 | OROMUCOSAL | Status: DC | PRN
  Filled 2016-10-31: qty 177

## 2016-10-31 MED ORDER — HYDRALAZINE HCL 20 MG/ML IJ SOLN
5.0000 mg | INTRAMUSCULAR | Status: AC | PRN
  Administered 2016-10-31: 14:00:00 via INTRAVENOUS
  Administered 2016-10-31: 20 mg via INTRAVENOUS

## 2016-10-31 MED ORDER — HEPARIN SODIUM (PORCINE) 1000 UNIT/ML IJ SOLN
INTRAMUSCULAR | Status: AC
  Filled 2016-10-31: qty 1

## 2016-10-31 MED ORDER — HEPARIN SODIUM (PORCINE) 1000 UNIT/ML IJ SOLN
INTRAMUSCULAR | Status: DC | PRN
  Administered 2016-10-31: 5000 [IU] via INTRAVENOUS

## 2016-10-31 MED ORDER — HYDROMORPHONE HCL 1 MG/ML IJ SOLN: 0.5000 mg | INTRAMUSCULAR | Status: DC | PRN

## 2016-10-31 MED ORDER — ACETAMINOPHEN 325 MG PO TABS
325.0000 mg | ORAL_TABLET | ORAL | Status: DC | PRN
  Administered 2016-10-31: 650 mg via ORAL

## 2016-10-31 MED ORDER — NITROGLYCERIN 5 MG/ML IV SOLN
INTRAVENOUS | Status: AC
  Filled 2016-10-31: qty 10

## 2016-10-31 MED ORDER — SODIUM CHLORIDE 0.9 % IV SOLN
INTRAVENOUS | Status: DC
  Administered 2016-10-31: 08:00:00 via INTRAVENOUS

## 2016-10-31 MED ORDER — GUAIFENESIN-DM 100-10 MG/5ML PO SYRP
15.0000 mL | ORAL_SOLUTION | ORAL | Status: DC | PRN
  Filled 2016-10-31: qty 15

## 2016-10-31 MED ORDER — METOPROLOL TARTRATE 5 MG/5ML IV SOLN: 2.0000 mg | INTRAVENOUS | Status: DC | PRN

## 2016-10-31 MED ORDER — ACETAMINOPHEN 325 MG PO TABS
ORAL_TABLET | ORAL | Status: AC
  Filled 2016-10-31: qty 2

## 2016-10-31 MED ORDER — MIDAZOLAM HCL 2 MG/2ML IJ SOLN
INTRAMUSCULAR | Status: DC | PRN
  Administered 2016-10-31 (×2): 2 mg via INTRAVENOUS

## 2016-10-31 MED ORDER — LABETALOL HCL 5 MG/ML IV SOLN
INTRAVENOUS | Status: AC
  Filled 2016-10-31: qty 4

## 2016-10-31 MED ORDER — SODIUM CHLORIDE 0.9 % IV SOLN: 500.0000 mL | Freq: Once | INTRAVENOUS | Status: DC | PRN

## 2016-10-31 MED ORDER — SODIUM CHLORIDE 0.9 % IJ SOLN
INTRAMUSCULAR | Status: AC
  Filled 2016-10-31: qty 50

## 2016-10-31 MED ORDER — CLOPIDOGREL BISULFATE 75 MG PO TABS: 75.0000 mg | ORAL_TABLET | Freq: Every day | ORAL | 11 refills | Status: DC

## 2016-10-31 MED ORDER — HEPARIN (PORCINE) IN NACL 2-0.9 UNIT/ML-% IJ SOLN
INTRAMUSCULAR | Status: AC
  Filled 2016-10-31: qty 1000

## 2016-10-31 MED ORDER — CEFAZOLIN SODIUM-DEXTROSE 1-4 GM/50ML-% IV SOLN
INTRAVENOUS | Status: AC
  Filled 2016-10-31: qty 50

## 2016-10-31 MED ORDER — FENTANYL CITRATE (PF) 100 MCG/2ML IJ SOLN
INTRAMUSCULAR | Status: DC | PRN
  Administered 2016-10-31 (×2): 50 ug via INTRAVENOUS

## 2016-10-31 MED ORDER — FENTANYL CITRATE (PF) 100 MCG/2ML IJ SOLN
INTRAMUSCULAR | Status: AC
  Filled 2016-10-31: qty 2

## 2016-10-31 MED ORDER — LABETALOL HCL 5 MG/ML IV SOLN: 10.0000 mg | INTRAVENOUS | Status: DC | PRN

## 2016-10-31 MED ORDER — HYDRALAZINE HCL 20 MG/ML IJ SOLN
INTRAMUSCULAR | Status: AC
  Filled 2016-10-31: qty 1

## 2016-10-31 MED ORDER — NITROGLYCERIN 1 MG/10 ML FOR IR/CATH LAB
INTRA_ARTERIAL | Status: DC | PRN
  Administered 2016-10-31: 250 ug

## 2016-10-31 MED ORDER — LIDOCAINE-EPINEPHRINE (PF) 2 %-1:200000 IJ SOLN
INTRAMUSCULAR | Status: AC
  Filled 2016-10-31: qty 20

## 2016-10-31 MED ORDER — ACETAMINOPHEN 325 MG RE SUPP
325.0000 mg | RECTAL | Status: DC | PRN
  Filled 2016-10-31: qty 2

## 2016-10-31 MED ORDER — MIDAZOLAM HCL 5 MG/5ML IJ SOLN
INTRAMUSCULAR | Status: AC
  Filled 2016-10-31: qty 5

## 2016-10-31 MED ORDER — OXYCODONE-ACETAMINOPHEN 5-325 MG PO TABS: 1.0000 | ORAL_TABLET | ORAL | Status: DC | PRN

## 2016-10-31 MED ORDER — ONDANSETRON HCL 4 MG/2ML IJ SOLN: 4.0000 mg | Freq: Four times a day (QID) | INTRAMUSCULAR | Status: DC | PRN

## 2016-10-31 MED ORDER — LABETALOL HCL 5 MG/ML IV SOLN
10.0000 mg | Freq: Once | INTRAVENOUS | Status: AC
  Administered 2016-10-31: 10 mg via INTRAVENOUS

## 2016-10-31 MED ORDER — SODIUM CHLORIDE 0.9 % IV SOLN
Freq: Once | INTRAVENOUS | Status: AC
  Administered 2016-10-31: 09:00:00 via INTRAVENOUS

## 2016-10-31 MED ORDER — IOPAMIDOL (ISOVUE-300) INJECTION 61%
INTRAVENOUS | Status: DC | PRN
  Administered 2016-10-31: 65 mL via INTRA_ARTERIAL

## 2016-10-31 SURGICAL SUPPLY — 19 items
BALLN LUTONIX 6X150X130 (BALLOONS) ×6
BALLN LUTONIX AV 8X60X75 (BALLOONS) ×3
BALLN ULTRVRSE 3X150X130 (BALLOONS) ×3
BALLOON LUTONIX 6X150X130 (BALLOONS) ×2 IMPLANT
BALLOON LUTONIX AV 8X60X75 (BALLOONS) ×1 IMPLANT
BALLOON ULTRVRSE 3X150X130 (BALLOONS) ×1 IMPLANT
CATH BEACON 5 .035 65 KMP TIP (CATHETERS) ×3 IMPLANT
CATH BEACON 5 .038 100 VERT TP (CATHETERS) ×3 IMPLANT
CATH PIG 70CM (CATHETERS) ×3 IMPLANT
DEVICE PRESTO INFLATION (MISCELLANEOUS) ×3 IMPLANT
DEVICE STARCLOSE SE CLOSURE (Vascular Products) ×3 IMPLANT
GLIDEWIRE ADV .035X260CM (WIRE) ×3 IMPLANT
LIFESTENT SOLO 7X200X135 (Permanent Stent) ×3 IMPLANT
PACK ANGIOGRAPHY (CUSTOM PROCEDURE TRAY) ×3 IMPLANT
SHEATH ANL2 6FRX45 HC (SHEATH) ×3 IMPLANT
SHEATH BRITE TIP 5FRX11 (SHEATH) ×3 IMPLANT
SYR MEDRAD MARK V 150ML (SYRINGE) ×3 IMPLANT
TUBING CONTRAST HIGH PRESS 72 (TUBING) ×3 IMPLANT
WIRE J 3MM .035X145CM (WIRE) ×3 IMPLANT

## 2016-10-31 NOTE — Progress Notes (Signed)
Called MD re: persistent high BP and pt. c/o right ant. Thigh discomfort. New orders received. Pt. Med. With labetalol 10 mg IV now. Pt. And family made aware MD states the leg discomfort "is normal."

## 2016-10-31 NOTE — H&P (Signed)
[facility] History & Physical Update  The patient was interviewed and re-examined.  The patient's previous History and Physical has been reviewed and is unchanged.  There is no change in the plan of care. We plan to proceed with the scheduled procedure.  Leotis Pain, MD  10/31/2016, 8:08 AM

## 2016-10-31 NOTE — Progress Notes (Signed)
Pt. Med. With 4th dose hydralazine 5 mg IV: BP 198/76-56. Pt. States " my leg is a little better after walking to the BR.'No hematoma, edema, drainage. DOP pulses present bilat.

## 2016-10-31 NOTE — Op Note (Signed)
[facility] Percutaneous Study/Intervention Procedural Note   Date of Surgery: 10/31/2016  Surgeon(s):Noelly Lasseigne   Assistants:none  Pre-operative Diagnosis: PAD with claudication bilateral lower extremities  Post-operative diagnosis: Same  Procedure(s) Performed: 1. Ultrasound guidance for vascular access left femoral artery 2. Catheter placement into right peroneal artery from left femoral approach 3. Aortogram and selective right lower extremity angiogram 4. Percutaneous transluminal angioplasty of right peroneal artery and tibioperoneal trunk with 3 mm diameter by 15 cm length angioplasty balloon 5. Percutaneous transluminal angioplasty of the right SFA with two 6 mm diameter by 15 cm length Lutonix drug-coated angioplasty balloons  6.  Stent placement to the right SFA for stenosis and dissection after angioplasty using a 7 mm diameter by 20 cm length life stent  7.  Percutaneous transluminal angioplasty of the left common iliac artery with 28m diameter by 6 cm length Lutonix drug-coated angioplasty balloon 8. StarClose closure device left femoral artery  EBL: Minimal  Contrast: 65 cc  Fluoro Time: 8 minutes  Moderate Conscious Sedation Time: approximately 50 minutes using 4 mg of Versed and 100 mcg of Fentanyl  Indications: Patient is a 71y.o.male with lifestyle limiting claudication in both lower extremities. The patient has noninvasive study showing moderate to severe reduction in his ABIs bilaterally. The patient is brought in for angiography for further evaluation and potential treatment. Risks and benefits are discussed and informed consent is obtained  Procedure: The patient was identified and appropriate procedural time out was performed. The patient was then placed supine on the table and prepped and draped in the usual sterile fashion.Moderate  conscious sedation was administered during a face to face encounter with the patient throughout the procedure with my supervision of the RN administering medicines and monitoring the patient's vital signs, pulse oximetry, telemetry and mental status throughout from the start of the procedure until the patient was taken to the recovery room. Ultrasound was used to evaluate the left common femoral artery. It was patent . A digital ultrasound image was acquired. A Seldinger needle was used to access the left common femoral artery under direct ultrasound guidance and a permanent image was performed. A 0.035 J wire was advanced without resistance and a 5Fr sheath was placed. Pigtail catheter was placed into the aorta and an AP aortogram was performed. This demonstrated that the right renal artery not well seen. Left renal artery appeared widely patent. Unclear if this was related to catheter position or actual stenosis. With his chronic renal insufficiency, further interrogation with more contrast was not warranted today. The aorta itself was widely patent. The right iliac system was widely patent without significant stenosis. There was a very irregular stenosis in the left common iliac artery about 1-2 cm beyond the origin in the 60% range. The left external iliac artery and both hypogastric arteries were patent. I then crossed the aortic bifurcation and advanced to the right femoral head. Selective right lower extremity angiogram was then performed. This demonstrated at least 70% stenosis in the profunda femoris artery and an occlusion of the superficial femoral artery within a few centimeters of its origin. The above-knee popliteal artery then reconstituted. There was then what appeared to be a reasonably normal tibial trifurcation but a stenosis was present in the tibioperoneal trunk and another focal stenosis was present in the peroneal artery about 6-8 cm beyond its origin with this being the dominant runoff  distally. The posterior tibial artery occluded in the midsegment and did not appear to reconstitute distally.  The anterior tibial artery occluded in the mid to distal segment and there was faint reconstitution of the dorsalis pedis artery in the foot. The patient was systemically heparinized and a 6 [place] [employer] sheath was then placed over the [employer] wire. I then used a Kumpe catheter and the advantage wire to navigate through the occlusion and confirm intraluminal flow in the below-knee popliteal artery. I then advanced to the tibioperoneal trunk and peroneal stenosis with the advantage wire and the Kumpe catheter and then proceed with treatment. A 3 mm diameter by 15 cm length angioplasty balloon was inflated in the tibioperoneal trunk and proximal peroneal arteries. This was inflated up to 10 atm for 1 minute. I then used a 6 mm diameter by 15 cm length Lutonix drug-coated angioplasty balloons to treat the SFA occlusion. 2 balloons were required. These inflations were performed 10-12 atm for 1 minute. Completion angiogram showed a spiral dissection in the SFA in the proximal and mid segments. A 7 mm diameter by 20 cm length life stent was used to treat this dissection postdilated with a 6 mm balloon with excellent angiographic completion result and less than 20% residual stenosis. The tibioperoneal trunk and peroneal artery demonstrated a less than 30% residual stenosis with brisk flow distally. He did not have ulceration so I did not try to treat the long occlusions of the posterior tibial or anterior tibial arteries. The sheath was pulled back to the ipsilateral external iliac artery on the left and an 8 mm diameter by 6 cm length Lutonix drug-coated angioplasty balloon was used to treat the left common iliac artery. This was inflated to 8 atm for 1 minute. Completion angiogram following this showed focal irregular stenosis of about 30-40% but improved and no longer appearing flow-limiting. He has  another procedure scheduled to treat the left leg in the future, and if this recurs stent placement would be performed at that time. I elected to terminate the procedure. The sheath was removed and StarClose closure device was deployed in the left femoral artery with excellent hemostatic result. The patient was taken to the recovery room in stable condition having tolerated the procedure well.  Findings:  Aortogram: Right renal artery not well seen. Left renal artery appeared widely patent. Unclear if this was related to catheter position or actual stenosis. With his chronic renal insufficiency, further interrogation with more contrast was not warranted today. The aorta itself was widely patent. The right iliac system was widely patent without significant stenosis. There was a very irregular stenosis in the left common iliac artery about 1-2 cm beyond the origin in the 60% range. The left external iliac artery and both hypogastric arteries were patent. Right Lower Extremity: This demonstrated at least 70% stenosis in the profunda femoris artery and an occlusion of the superficial femoral artery within a few centimeters of its origin. The above-knee popliteal artery then reconstituted. There was then what appeared to be a reasonably normal tibial trifurcation but a stenosis was present in the tibioperoneal trunk and another focal stenosis was present in the peroneal artery about 6-8 cm beyond its origin with this being the dominant runoff distally. The posterior tibial artery occluded in the midsegment and did not appear to reconstitute distally. The anterior tibial artery occluded in the mid to distal segment and there was faint reconstitution of the dorsalis pedis artery in the foot.   Disposition: Patient was taken to the recovery room in stable condition having tolerated the procedure well.  Complications: None  Leotis Pain 10/31/2016 11:08 AM   This note was created with  Dragon Medical transcription system. Any errors in dictation are purely unintentional.

## 2016-10-31 NOTE — Progress Notes (Signed)
Dr. Lucky Cowboy at bedside speaking with pt. And family re: procedure today. Both verbalized understanding.

## 2016-10-31 NOTE — Progress Notes (Signed)
BP : 208/88 -53-18. Pt. Ambulated to BR to void. Pt. Returned to bed and rested at bedside chair. Repeat BP 820 systolic. Med. With hydralazine 5 mg IVP secondary to low HR/ high BP.Pt. Asymptomaticl. Left groin clean, dry, intact. Right groin with residual ecchymosis from groin to mid inner thigh. Sites soft bilat.

## 2016-10-31 NOTE — Progress Notes (Signed)
Pt. C/o constant cramping feeling to right anterior thigh area (post SFA stent). Medicated with 2 plain tylenol 650 mg p.o. Now and 2nd dose of hydralazine 5 mg IV for elevated BP. Pt. Returned to lying in bed with HOB up 45 degrees.

## 2016-10-31 NOTE — OR Nursing (Signed)
Reviewed GFR and K+ with Dr dew p[rior to arrival to procedure room. BBB noted on EKG. IV NS bolus infusing.

## 2016-10-31 NOTE — Progress Notes (Signed)
BP 170/67. Pt. Assisted OOB , AMBULATED. BILAT. GROINS STABLE without complications. Stable for DC.

## 2016-11-01 ENCOUNTER — Encounter: Payer: Self-pay | Admitting: Vascular Surgery

## 2016-11-03 ENCOUNTER — Other Ambulatory Visit (INDEPENDENT_AMBULATORY_CARE_PROVIDER_SITE_OTHER): Payer: Self-pay

## 2016-11-08 ENCOUNTER — Telehealth (INDEPENDENT_AMBULATORY_CARE_PROVIDER_SITE_OTHER): Payer: Self-pay

## 2016-11-08 NOTE — Telephone Encounter (Signed)
Patient called wanting to know if it should take this long for the soreness to leave his leg, I explained that sometimes it does take a few days for the soreness to work itself out. He also wanted to know if having his right leg worked on may have fixed the swelling of his left ankle because it has not swollen up since his right leg angio last week.

## 2016-11-08 NOTE — Telephone Encounter (Signed)
I dont know that I can explain the resolved swelling, but glad it has.  He did have a fair amount of occlusive disease, so he may be sore longer than most.

## 2016-11-09 NOTE — Telephone Encounter (Signed)
Spoke with patient and let him know what Dr. Lucky Cowboy said and he stated he was feeling much better today and he was happy I had called him back.

## 2016-11-10 ENCOUNTER — Encounter
Admission: RE | Admit: 2016-11-10 | Discharge: 2016-11-10 | Disposition: A | Discharge status: Home / Self Care | Payer: Medicare Other | Source: Ambulatory Visit | Attending: Vascular Surgery | Admitting: Vascular Surgery

## 2016-11-10 DIAGNOSIS — Z01818 Encounter for other preprocedural examination: Secondary | ICD-10-CM | POA: Diagnosis not present

## 2016-11-10 LAB — CREATININE, SERUM
CREATININE: 2.07 mg/dL — AB (ref 0.61–1.24)
GFR, EST AFRICAN AMERICAN: 35 mL/min — AB (ref >60)
GFR, EST NON AFRICAN AMERICAN: 30 mL/min — AB (ref >60)

## 2016-11-10 LAB — BUN: BUN: 36 mg/dL — ABNORMAL HIGH (ref 6–20)

## 2016-11-11 ENCOUNTER — Other Ambulatory Visit (INDEPENDENT_AMBULATORY_CARE_PROVIDER_SITE_OTHER): Payer: Self-pay

## 2016-11-11 ENCOUNTER — Inpatient Hospital Stay: Admission: RE | Admit: 2016-11-11 | Payer: Self-pay | Source: Ambulatory Visit

## 2016-11-13 MED ORDER — CEFAZOLIN SODIUM-DEXTROSE 1-4 GM/50ML-% IV SOLN
1.0000 g | Freq: Once | INTRAVENOUS | Status: AC
  Administered 2016-11-14: 1 g via INTRAVENOUS

## 2016-11-14 ENCOUNTER — Encounter
Admission: RE | Disposition: A | Discharge status: Home / Self Care | Payer: Self-pay | Source: Ambulatory Visit | Attending: Vascular Surgery

## 2016-11-14 ENCOUNTER — Encounter: Payer: Self-pay | Admitting: *Deleted

## 2016-11-14 ENCOUNTER — Ambulatory Visit
Admission: RE | Admit: 2016-11-14 | Discharge: 2016-11-14 | Disposition: A | Discharge status: Home / Self Care | Payer: Medicare Other | Source: Ambulatory Visit | Attending: Vascular Surgery | Admitting: Vascular Surgery

## 2016-11-14 DIAGNOSIS — E785 Hyperlipidemia, unspecified: Secondary | ICD-10-CM | POA: Diagnosis not present

## 2016-11-14 DIAGNOSIS — Z9889 Other specified postprocedural states: Secondary | ICD-10-CM | POA: Insufficient documentation

## 2016-11-14 DIAGNOSIS — Z881 Allergy status to other antibiotic agents status: Secondary | ICD-10-CM | POA: Diagnosis not present

## 2016-11-14 DIAGNOSIS — Z85828 Personal history of other malignant neoplasm of skin: Secondary | ICD-10-CM | POA: Insufficient documentation

## 2016-11-14 DIAGNOSIS — I10 Essential (primary) hypertension: Secondary | ICD-10-CM | POA: Diagnosis not present

## 2016-11-14 DIAGNOSIS — Z7984 Long term (current) use of oral hypoglycemic drugs: Secondary | ICD-10-CM | POA: Diagnosis not present

## 2016-11-14 DIAGNOSIS — M109 Gout, unspecified: Secondary | ICD-10-CM | POA: Insufficient documentation

## 2016-11-14 DIAGNOSIS — K2210 Ulcer of esophagus without bleeding: Secondary | ICD-10-CM | POA: Diagnosis not present

## 2016-11-14 DIAGNOSIS — I70212 Atherosclerosis of native arteries of extremities with intermittent claudication, left leg: Secondary | ICD-10-CM | POA: Diagnosis not present

## 2016-11-14 DIAGNOSIS — E875 Hyperkalemia: Secondary | ICD-10-CM | POA: Insufficient documentation

## 2016-11-14 DIAGNOSIS — E119 Type 2 diabetes mellitus without complications: Secondary | ICD-10-CM | POA: Insufficient documentation

## 2016-11-14 DIAGNOSIS — I70213 Atherosclerosis of native arteries of extremities with intermittent claudication, bilateral legs: Secondary | ICD-10-CM | POA: Insufficient documentation

## 2016-11-14 DIAGNOSIS — Z955 Presence of coronary angioplasty implant and graft: Secondary | ICD-10-CM | POA: Insufficient documentation

## 2016-11-14 DIAGNOSIS — Z801 Family history of malignant neoplasm of trachea, bronchus and lung: Secondary | ICD-10-CM | POA: Insufficient documentation

## 2016-11-14 DIAGNOSIS — Z9849 Cataract extraction status, unspecified eye: Secondary | ICD-10-CM | POA: Diagnosis not present

## 2016-11-14 DIAGNOSIS — I2510 Atherosclerotic heart disease of native coronary artery without angina pectoris: Secondary | ICD-10-CM | POA: Diagnosis not present

## 2016-11-14 DIAGNOSIS — Z86010 Personal history of colonic polyps: Secondary | ICD-10-CM | POA: Diagnosis not present

## 2016-11-14 DIAGNOSIS — Z7982 Long term (current) use of aspirin: Secondary | ICD-10-CM | POA: Diagnosis not present

## 2016-11-14 DIAGNOSIS — Z8614 Personal history of Methicillin resistant Staphylococcus aureus infection: Secondary | ICD-10-CM | POA: Insufficient documentation

## 2016-11-14 DIAGNOSIS — Z820 Family history of epilepsy and other diseases of the nervous system: Secondary | ICD-10-CM | POA: Diagnosis not present

## 2016-11-14 DIAGNOSIS — Z951 Presence of aortocoronary bypass graft: Secondary | ICD-10-CM | POA: Insufficient documentation

## 2016-11-14 DIAGNOSIS — M6530 Trigger finger, unspecified finger: Secondary | ICD-10-CM | POA: Diagnosis not present

## 2016-11-14 HISTORY — PX: LOWER EXTREMITY ANGIOGRAPHY: CATH118251

## 2016-11-14 HISTORY — PX: LOWER EXTREMITY INTERVENTION: CATH118252

## 2016-11-14 LAB — GLUCOSE, CAPILLARY
GLUCOSE-CAPILLARY: 164 mg/dL — AB (ref 65–99)
GLUCOSE-CAPILLARY: 159 mg/dL — AB (ref 65–99)

## 2016-11-14 SURGERY — LOWER EXTREMITY ANGIOGRAPHY
Anesthesia: Moderate Sedation | Laterality: Left

## 2016-11-14 MED ORDER — PHENOL 1.4 % MT LIQD
1.0000 | OROMUCOSAL | Status: DC | PRN
  Filled 2016-11-14: qty 177

## 2016-11-14 MED ORDER — LABETALOL HCL 5 MG/ML IV SOLN: 10.0000 mg | INTRAVENOUS | Status: DC | PRN

## 2016-11-14 MED ORDER — HEPARIN (PORCINE) IN NACL 2-0.9 UNIT/ML-% IJ SOLN
INTRAMUSCULAR | Status: AC | PRN
Start: 2016-11-14 — End: 2016-11-14
  Administered 2016-11-14: 2000 mL

## 2016-11-14 MED ORDER — SODIUM CHLORIDE 0.9 % IV SOLN: INTRAVENOUS | Status: DC

## 2016-11-14 MED ORDER — HYDRALAZINE HCL 20 MG/ML IJ SOLN: 5.0000 mg | INTRAMUSCULAR | Status: DC | PRN

## 2016-11-14 MED ORDER — HYDROMORPHONE HCL 1 MG/ML IJ SOLN: 0.5000 mg | INTRAMUSCULAR | Status: DC | PRN

## 2016-11-14 MED ORDER — MIDAZOLAM HCL 5 MG/5ML IJ SOLN
INTRAMUSCULAR | Status: AC
  Filled 2016-11-14: qty 5

## 2016-11-14 MED ORDER — MIDAZOLAM HCL 2 MG/2ML IJ SOLN
INTRAMUSCULAR | Status: DC | PRN
  Administered 2016-11-14: 1 mg via INTRAVENOUS
  Administered 2016-11-14: 2 mg via INTRAVENOUS
  Administered 2016-11-14 (×2): 1 mg via INTRAVENOUS

## 2016-11-14 MED ORDER — FENTANYL CITRATE (PF) 100 MCG/2ML IJ SOLN
INTRAMUSCULAR | Status: DC | PRN
  Administered 2016-11-14: 25 ug via INTRAVENOUS
  Administered 2016-11-14: 50 ug via INTRAVENOUS
  Administered 2016-11-14: 25 ug via INTRAVENOUS

## 2016-11-14 MED ORDER — FENTANYL CITRATE (PF) 100 MCG/2ML IJ SOLN
INTRAMUSCULAR | Status: AC
  Filled 2016-11-14: qty 2

## 2016-11-14 MED ORDER — GUAIFENESIN-DM 100-10 MG/5ML PO SYRP
15.0000 mL | ORAL_SOLUTION | ORAL | Status: DC | PRN
  Filled 2016-11-14: qty 15

## 2016-11-14 MED ORDER — HEPARIN SODIUM (PORCINE) 1000 UNIT/ML IJ SOLN
INTRAMUSCULAR | Status: AC
  Filled 2016-11-14: qty 1

## 2016-11-14 MED ORDER — OXYCODONE-ACETAMINOPHEN 5-325 MG PO TABS: 1.0000 | ORAL_TABLET | ORAL | Status: DC | PRN

## 2016-11-14 MED ORDER — HEPARIN (PORCINE) IN NACL 2-0.9 UNIT/ML-% IJ SOLN
INTRAMUSCULAR | Status: AC
  Filled 2016-11-14: qty 1000

## 2016-11-14 MED ORDER — IOPAMIDOL (ISOVUE-300) INJECTION 61%
INTRAVENOUS | Status: DC | PRN
  Administered 2016-11-14: 90 mL via INTRAVENOUS

## 2016-11-14 MED ORDER — METOPROLOL TARTRATE 5 MG/5ML IV SOLN: 2.0000 mg | INTRAVENOUS | Status: DC | PRN

## 2016-11-14 MED ORDER — SODIUM CHLORIDE 0.9 % IV BOLUS (SEPSIS)
250.0000 mL | Freq: Once | INTRAVENOUS | Status: AC
  Administered 2016-11-14: 250 mL via INTRAVENOUS

## 2016-11-14 MED ORDER — HEPARIN SODIUM (PORCINE) 1000 UNIT/ML IJ SOLN
INTRAMUSCULAR | Status: DC | PRN
  Administered 2016-11-14: 4000 [IU] via INTRAVENOUS

## 2016-11-14 MED ORDER — SODIUM CHLORIDE 0.9 % IV SOLN: 500.0000 mL | Freq: Once | INTRAVENOUS | Status: DC | PRN

## 2016-11-14 MED ORDER — ACETAMINOPHEN 325 MG PO TABS: 325.0000 mg | ORAL_TABLET | ORAL | Status: DC | PRN

## 2016-11-14 MED ORDER — ACETAMINOPHEN 325 MG RE SUPP
325.0000 mg | RECTAL | Status: DC | PRN
  Filled 2016-11-14: qty 2

## 2016-11-14 MED ORDER — ONDANSETRON HCL 4 MG/2ML IJ SOLN: 4.0000 mg | Freq: Four times a day (QID) | INTRAMUSCULAR | Status: DC | PRN

## 2016-11-14 MED ORDER — LIDOCAINE-EPINEPHRINE (PF) 2 %-1:200000 IJ SOLN
INTRAMUSCULAR | Status: AC
  Filled 2016-11-14: qty 20

## 2016-11-14 SURGICAL SUPPLY — 27 items
BALLN LUTONIX 6X150X130 (BALLOONS) ×4
BALLN LUTONIX DCB 5X100X130 (BALLOONS) ×4
BALLN ULTRVRSE 3X100X130C (BALLOONS) ×4
BALLN ULTRVRSE 3X220X150 (BALLOONS) ×4
BALLOON LUTONIX 6X150X130 (BALLOONS) ×2 IMPLANT
BALLOON LUTONIX DCB 5X100X130 (BALLOONS) ×2 IMPLANT
BALLOON ULTRVRSE 3X100X130C (BALLOONS) ×2 IMPLANT
BALLOON ULTRVRSE 3X220X150 (BALLOONS) ×2 IMPLANT
CANISTER PENUMBRA MAX (MISCELLANEOUS) IMPLANT
CATH BEACON 5 .038 100 VERT TP (CATHETERS) ×4 IMPLANT
CATH IMAGER II S 5FR 65CM (MISCELLANEOUS) ×4 IMPLANT
CATH INDIGO 6 ST-TIP 135CM (CATHETERS) IMPLANT
COVER PROBE U/S 5X48 (MISCELLANEOUS) ×4 IMPLANT
DEVICE PRESTO INFLATION (MISCELLANEOUS) ×4 IMPLANT
DEVICE STARCLOSE SE CLOSURE (Vascular Products) ×4 IMPLANT
DILATOR VESSEL 4.0FR 35 20CM (INTRODUCER) ×4 IMPLANT
GLIDEWIRE ADV .035X260CM (WIRE) ×8 IMPLANT
PACK ANGIOGRAPHY (CUSTOM PROCEDURE TRAY) ×4 IMPLANT
SHEATH ANL2 6FRX45 HC (SHEATH) ×4 IMPLANT
SHEATH BRITE TIP 5FRX11 (SHEATH) ×4 IMPLANT
SHEATH BRITE TIP 7FRX5.5 (SHEATH) ×4 IMPLANT
STENT VIABAHN 6X150X120 (Permanent Stent) ×4 IMPLANT
SYR MEDRAD MARK V 150ML (SYRINGE) ×4 IMPLANT
TUBING ASPIRATION INDIGO (MISCELLANEOUS) IMPLANT
TUBING CONTRAST HIGH PRESS 72 (TUBING) ×4 IMPLANT
WIRE G V18X300CM (WIRE) ×4 IMPLANT
WIRE J 3MM .035X145CM (WIRE) ×4 IMPLANT

## 2016-11-14 NOTE — Progress Notes (Signed)
Procedure done at this time,tolerated well. Versed 5mg  along with fentanyl 137mcg,given for procedure.  pt's vitals remained stable throughout entire procedure. Will transfer back to specials to finish recovery prior to discharge.

## 2016-11-14 NOTE — H&P (Signed)
[facility] History & Physical Update  The patient was interviewed and re-examined.  The patient's previous History and Physical has been reviewed and is unchanged.  There is no change in the plan of care. We plan to proceed with the scheduled procedure.  Leotis Pain, MD  11/14/2016, 8:07 AM

## 2016-11-14 NOTE — Progress Notes (Signed)
Patient here today for lower extremity angiogram per Dr Lucky Cowboy. Placed on monitor for procedure. Will monitor vitals throughout entire procedure.

## 2016-11-14 NOTE — Op Note (Signed)
[facility] Percutaneous Study/Intervention Procedural Note   Date of Surgery: 11/14/2016  Surgeon(s):DEW,JASON   Assistants:none  Pre-operative Diagnosis: PAD with claudication bilateral lower extremities  Post-operative diagnosis: Same  Procedure(s) Performed: 1. Ultrasound guidance for vascular access right femoral artery 2. Catheter placement into left posterior tibial artery and peroneal artery from right femoral approach 3. Aortogram and selective left lower extremity angiogram 4. Percutaneous transluminal angioplasty of left posterior tibial artery with 3 mm diameter by 10 cm length angioplasty balloon 5. Percutaneous transluminal angioplasty of left popliteal artery with 5 mm diameter by 10 cm length Lutonix drug-coated angioplasty balloon and 6 mm diameter by 15 cm length Lutonix drug-coated angioplasty balloon in the SFA  6.  Percutaneous transluminal angioplasty of the left peroneal artery with 3 mm diameter by 22 cm length angioplasty balloon  7.  Viabahn stent placement to the mid SFA for dissection and greater than 50% residual stenosis after angioplasty with a 6 mm diameter by 15 cm length stent 8. StarClose closure device right femoral artery  EBL: 10 cc  Contrast: 90 cc  Fluoro Time: 6 minutes  Moderate Conscious Sedation Time: approximately 45 minutes using 5 mg of Versed and 100 mcg of Fentanyl  Indications: Patient is a 71 y.o.male with short distance claudication of both lower extremities. The patient has noninvasive study showing markedly reduced perfusion bilaterally. He has already undergone right lower extremity revascularization. The patient is brought in for angiography for further evaluation and potential treatment. Risks and benefits are discussed and informed consent is obtained  Procedure: The patient was identified and appropriate  procedural time out was performed. The patient was then placed supine on the table and prepped and draped in the usual sterile fashion.Moderate conscious sedation was administered during a face to face encounter with the patient throughout the procedure with my supervision of the RN administering medicines and monitoring the patient's vital signs, pulse oximetry, telemetry and mental status throughout from the start of the procedure until the patient was taken to the recovery room. Ultrasound was used to evaluate the right common femoral artery. It was patent . A digital ultrasound image was acquired. A Seldinger needle was used to access the right common femoral artery under direct ultrasound guidance and a permanent image was performed. A 0.035 J wire was advanced without resistance and a 5Fr sheath was placed. Pigtail catheter was placed into the aorta and an AP aortogram was performed. This demonstrated the right renal artery not particularly well seen but left renal artery was widely patent. Aorta was widely patent. Right iliac system without any significant stenosis. The previously treated left common iliac artery angioplasty site had only about a 30% residual stenosis which was not flow limiting. The remainder of the left iliac system was widely patent. I then crossed the aortic bifurcation and advanced to the left femoral head. Selective left lower extremity angiogram was then performed. This demonstrated diffuse stenoses in the mid SFA to distal SFA creating greater than 75% stenosis. The below-knee popliteal artery then had a greater than 90% stenosis. There was two-vessel runoff distally with 70% stenosis of the proximal posterior tibial artery and 3 areas of stenosis in the proximal and mid peroneal artery that were all greater than 60% with the middle of the 3 stenosis being near occlusive. The patient was systemically heparinized and a 6 [place] [employer] sheath was then placed over the [employer] wire. I then used a Kumpe catheter and the advantage wire to  navigate through the SFA and popliteal stenoses and then into the posterior tibial artery and across this proximal stenosis as well. I then confirmed intraluminal flow with the Kumpe catheter and replaced the wire. A 3 mm diameter by 10 cm length angioplasty balloon was inflated in the proximal posterior tibial artery up to 12 atm for 1 minute. I then used a 5 mm diameter by 10 cm length Lutonix drug-coated angioplasty balloon in the below-knee popliteal artery to treat the high-grade stenosis in this location. This was inflated up to about 8 atm for 1 minute. The SFA lesion was treated with a 6 mm diameter by 15 cm length Lutonix drug-coated angioplasty balloon inflated to 12 atm for 1 minute. Completion angiogram showed about 30% residual stenosis in the proximal posterior tibial artery, only about a 20% stenosis in the popliteal artery, but a greater than 50% residual stenosis and dissection was seen in the mid SFA and I elected to cover this area with a stent. I then replaced the Kumpe catheter and removed the advantage wire exchanging for a V 18 wire and also crossing the stenoses in the peroneal artery and confirming intraluminal flow. Over the 0.018 wire I used a 3 mm diameter by 22 cm length angioplasty balloon to treat the multiple peroneal artery stenoses. This was inflated up to 14 atm for 1 minute. I then deployed a 6 mm diameter by 15 cm length Viabahn stent in the mid left SFA residual stenosis and dissection and postdilated this with a 5 mm balloon. Completion angiogram showed no significant residual stenosis in the SFA stent with brisk flow. The peroneal artery had about 20% residual stenosis at the more proximal lesion and only about 10% residual stenosis in the to mid peroneal lesions with good flow distally. I elected to terminate the procedure. The sheath was removed and StarClose closure device was deployed in the right  femoral artery with excellent hemostatic result. The patient was taken to the recovery room in stable condition having tolerated the procedure well.  Findings:  Aortogram: Right renal artery not particularly well seen but left renal artery was widely patent. Aorta was widely patent. Right iliac system without any significant stenosis. The previously treated left common iliac artery angioplasty site had only about a 30% residual stenosis which was not flow limiting. The remainder of the left iliac system was widely patent. Left Lower Extremity: This demonstrated diffuse stenoses in the mid SFA to distal SFA creating greater than 75% stenosis. The below-knee popliteal artery then had a greater than 90% stenosis. There was two-vessel runoff distally with 70% stenosis of the proximal posterior tibial artery and 3 areas of stenosis in the proximal and mid peroneal artery that were all greater than 60% with the middle of the 3 stenosis being near occlusive.   Disposition: Patient was taken to the recovery room in stable condition having tolerated the procedure well.  Complications: None  Leotis Pain 11/14/2016 9:38 AM   This note was created with Dragon Medical transcription system. Any errors in dictation are purely unintentional.

## 2016-11-15 ENCOUNTER — Encounter: Payer: Self-pay | Admitting: Vascular Surgery

## 2016-11-28 ENCOUNTER — Other Ambulatory Visit: Payer: Self-pay | Admitting: Family Medicine

## 2016-11-28 DIAGNOSIS — R600 Localized edema: Secondary | ICD-10-CM | POA: Diagnosis not present

## 2016-11-28 DIAGNOSIS — I10 Essential (primary) hypertension: Secondary | ICD-10-CM | POA: Diagnosis not present

## 2016-11-28 DIAGNOSIS — I25810 Atherosclerosis of coronary artery bypass graft(s) without angina pectoris: Secondary | ICD-10-CM | POA: Diagnosis not present

## 2016-11-28 DIAGNOSIS — R0602 Shortness of breath: Secondary | ICD-10-CM | POA: Diagnosis not present

## 2016-11-28 DIAGNOSIS — E782 Mixed hyperlipidemia: Secondary | ICD-10-CM | POA: Diagnosis not present

## 2016-12-26 ENCOUNTER — Other Ambulatory Visit: Payer: Self-pay | Admitting: Family Medicine

## 2016-12-27 ENCOUNTER — Encounter (INDEPENDENT_AMBULATORY_CARE_PROVIDER_SITE_OTHER): Payer: Self-pay | Admitting: Vascular Surgery

## 2016-12-27 ENCOUNTER — Ambulatory Visit (INDEPENDENT_AMBULATORY_CARE_PROVIDER_SITE_OTHER): Payer: Medicare Other | Admitting: Vascular Surgery

## 2016-12-27 VITALS — BP 201/86 | HR 61 | Resp 16 | Ht 69.0 in | Wt 167.0 lb

## 2016-12-27 DIAGNOSIS — E785 Hyperlipidemia, unspecified: Secondary | ICD-10-CM

## 2016-12-27 DIAGNOSIS — E119 Type 2 diabetes mellitus without complications: Secondary | ICD-10-CM

## 2016-12-27 DIAGNOSIS — I739 Peripheral vascular disease, unspecified: Secondary | ICD-10-CM

## 2016-12-27 DIAGNOSIS — I10 Essential (primary) hypertension: Secondary | ICD-10-CM | POA: Diagnosis not present

## 2016-12-27 NOTE — Progress Notes (Signed)
MRN : [id number]  Thomas [name] is a 71 y.o. (June 11, 1945) male who presents with chief complaint of  Chief Complaint  Patient presents with  . Routine Post Op    4 week f/u  .  History of Present Illness: Patient returns today in follow up of PAD. He has undergone bilateral lower extremity revascularization with marked improvement in his claudication symptoms. He reports essentially no cramping in his calves with activity although his legs do still tire little bit more easily. He recently got back from the beach where he had to walk a large amount of stairs and did a great amount of walking without significant symptoms. He was very encouraged by this. He had no perioprocedural complications. He did have some reperfusion leg swelling which has been gradually getting better. It is basically gone on the right and now mild on the left.  Current Outpatient Prescriptions  Medication Sig Dispense Refill  . acetaminophen (TYLENOL) 500 MG tablet Take 500 mg by mouth every 8 (eight) hours as needed for mild pain or headache.    Marland Kitchen aspirin EC 81 MG tablet Take 81 mg by mouth daily.    Marland Kitchen atenolol (TENORMIN) 25 MG tablet TAKE ONE (1) TABLET BY MOUTH EVERY DAY 30 tablet 12  . atorvastatin (LIPITOR) 80 MG tablet TAKE ONE (1) TABLET BY MOUTH EVERY DAY (Patient taking differently: TAKE ONE (1) TABLET BY MOUTH EVERY IN EVENING) 30 tablet 5  . clopidogrel (PLAVIX) 75 MG tablet Take 1 tablet (75 mg total) by mouth daily. 30 tablet 11  . Cyanocobalamin (VITAMIN B-12) 5000 MCG SUBL Place 5,000 mcg under the tongue daily.     Marland Kitchen glipiZIDE (GLUCOTROL XL) 10 MG 24 hr tablet TAKE ONE (1) TABLET BY MOUTH EVERY DAY WITH BREAKFAST 30 tablet 12  . glucose blood test strip     . lisinopril-hydrochlorothiazide (PRINZIDE,ZESTORETIC) 20-12.5 MG tablet TAKE ONE (1) TABLET BY MOUTH EVERY DAY 30 tablet 12  . metFORMIN (GLUCOPHAGE-XR) 500 MG 24 hr tablet TAKE TWO TABLETS BY MOUTH EVERY DAY 60 tablet 12  . pantoprazole  (PROTONIX) 40 MG tablet Take 1 tablet (40 mg total) by mouth daily. 30 tablet 12  . triamcinolone cream (KENALOG) 0.1 %     . buPROPion (WELLBUTRIN SR) 150 MG 12 hr tablet 1 tablet daily for 3 days, then 1 tablet twice daily. Stop smoking 14 days after starting medication (Patient not taking: Reported on 10/14/2016) 60 tablet 5  . colchicine 0.6 MG tablet Take 1 tablet (0.6 mg total) by mouth daily. As needed for gout (Patient not taking: Reported on 10/31/2016) 30 tablet 5   No current facility-administered medications for this visit.     Past Medical History:  Diagnosis Date  . Allergy   . Arthritis   . CAD (coronary artery disease)   . Diabetes mellitus without complication ([place])   . Erosive esophagitis   . GERD (gastroesophageal reflux disease)   . Gouty arthropathy   . History of colonic polyps   . History of kidney stones   . History of MRSA infection   . Hyperkalemia   . Hyperlipidemia   . Hypertension   . Melanoma ([place])   . Myocardial infarction ([place]) 1986  . Peripheral vascular disease ([place])   . 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 Trigger finger of left hand   . Ureteral tumor     Past Surgical History:  Procedure Laterality Date  . CARDIAC CATHETERIZATION    .  CATARACT EXTRACTION    . CORONARY ARTERY BYPASS GRAFT  1996  . EXCISION Ureteral Tumor    . EYE SURGERY Bilateral    Cataract Extraction with IOL  . LEFT HEART CATH AND CORONARY ANGIOGRAPHY N/A 10/18/2016   Procedure: Left Heart Cath and Coronary Angiography;  Surgeon: Isaias Cowman, MD;  Location: [facility];  Service: Cardiovascular;  Laterality: N/A;  . LOWER EXTREMITY ANGIOGRAPHY Right 10/31/2016   Procedure: Lower Extremity Angiography;  Surgeon: Algernon Huxley, MD;  Location: [facility];  Service: Cardiovascular;  Laterality: Right;  . LOWER EXTREMITY ANGIOGRAPHY Left 11/14/2016   Procedure: Lower Extremity Angiography;  Surgeon: Algernon Huxley, MD;  Location:  [facility];  Service: Cardiovascular;  Laterality: Left;  . LOWER EXTREMITY INTERVENTION  11/14/2016   Procedure: Lower Extremity Intervention;  Surgeon: Algernon Huxley, MD;  Location: [facility];  Service: Cardiovascular;;  . RETINAL DETACHMENT SURGERY Right November 2107    Social History Social History  Substance Use Topics  . Smoking status: Current Every Day Smoker    Packs/day: 0.50    Years: 51.00    Types: Cigarettes  . Smokeless tobacco: Never Used  . Alcohol use 0.0 oz/week     Comment:  2 beers a month    Family History Family History  Problem Relation Age of Onset  . Alzheimer's disease Mother   . Alzheimer's disease Brother   . Lung cancer Paternal Grandfather     Allergies  Allergen Reactions  . Ciprofloxacin Itching and Swelling    Facial swelling      REVIEW OF SYSTEMS (Negative unless checked)  Constitutional: [] Weight loss  [] Fever  [] Chills Cardiac: [] Chest pain   [] Chest pressure   [] Palpitations   [] Shortness of breath when laying flat   [] Shortness of breath at rest   [] Shortness of breath with exertion. Vascular:  [x] Pain in legs with walking   [] Pain in legs at rest   [] Pain in legs when laying flat   [x] Claudication   [] Pain in feet when walking  [] Pain in feet at rest  [] Pain in feet when laying flat   [] History of DVT   [] Phlebitis   [x] Swelling in legs   [] Varicose veins   [] Non-healing ulcers Pulmonary:   [] Uses home oxygen   [] Productive cough   [] Hemoptysis   [] Wheeze  [] COPD   [] Asthma Neurologic:  [] Dizziness  [] Blackouts   [] Seizures   [] History of stroke   [] History of TIA  [] Aphasia   [] Temporary blindness   [] Dysphagia   [] Weakness or numbness in arms   [] Weakness or numbness in legs Musculoskeletal:  [x] Arthritis   [] Joint swelling   [] Joint pain   [] Low back pain Hematologic:  [] Easy bruising  [] Easy bleeding   [] Hypercoagulable state   [] Anemic   Gastrointestinal:  [] Blood in stool   [] Vomiting blood   [] Gastroesophageal reflux/heartburn   [] Abdominal pain Genitourinary:  [] Chronic kidney disease   [] Difficult urination  [] Frequent urination  [] Burning with urination   [] Hematuria Skin:  [] Rashes   [] Ulcers   [] Wounds Psychological:  [] History of anxiety   []  History of major depression.  Physical Examination  BP (!) 201/86 (BP Location: Right Arm)   Pulse 61   Resp 16   Ht 5' 9"  (1.753 m)   Wt 167 lb (75.8 kg)   BMI 24.66 kg/m  Gen:  WD/WN, NAD Head: [place]/AT, No temporalis wasting. Ear/Nose/Throat: Hearing grossly intact, nares w/o erythema or drainage, trachea midline Eyes:  Conjunctiva clear. Sclera non-icteric Neck: Supple.  No JVD.  Pulmonary:  Good air movement, no use of accessory muscles.  Cardiac: RRR, normal S1, S2 Vascular:  Vessel Right Left  Radial Palpable Palpable                          PT 1+ Palpable Trace Palpable  DP Palpable 1+ Palpable   Gastrointestinal: soft, non-tender/non-distended.  Musculoskeletal: M/S 5/5 throughout.  No deformity or atrophy. 1+ LLE edema. Neurologic: Sensation grossly intact in extremities.  Symmetrical.  Speech is fluent.  Psychiatric: Judgment intact, Mood & affect appropriate for pt's clinical situation. Dermatologic: No rashes or ulcers noted.  No cellulitis or open wounds.       Labs Recent Results (from the past 2160 hour(s))  POCT HgB A1C     Status: None   Collection Time: 09/30/16  8:25 AM  Result Value Ref Range   Hemoglobin A1C 7.9    Est. average glucose Bld gHb Est-mCnc 180   Comprehensive metabolic panel     Status: Abnormal   Collection Time: 09/30/16  9:13 AM  Result Value Ref Range   Glucose 167 (H) 65 - 99 mg/dL   BUN 21 8 - 27 mg/dL   Creatinine, Ser 1.90 (H) 0.76 - 1.27 mg/dL   GFR calc non Af Amer 34 (L) >59 mL/min/1.73   GFR calc Af Amer 40 (L) >59 mL/min/1.73   BUN/Creatinine Ratio 11 10 - 24   Sodium 139 134 - 144 mmol/L   Potassium 5.3 (H) 3.5 - 5.2 mmol/L   Chloride 106 96 - 106  mmol/L   CO2 22 20 - 29 mmol/L    Comment:               **Please note reference interval change**   Calcium 9.4 8.6 - 10.2 mg/dL   Total Protein 6.1 6.0 - 8.5 g/dL   Albumin 3.5 3.5 - 4.8 g/dL   Globulin, Total 2.6 1.5 - 4.5 g/dL   Albumin/Globulin Ratio 1.3 1.2 - 2.2   Bilirubin Total <0.2 0.0 - 1.2 mg/dL   Alkaline Phosphatase 110 39 - 117 IU/L   AST 17 0 - 40 IU/L   ALT 19 0 - 44 IU/L  Lipid panel     Status: Abnormal   Collection Time: 09/30/16  9:13 AM  Result Value Ref Range   Cholesterol, Total 212 (H) 100 - 199 mg/dL   Triglycerides 322 (H) 0 - 149 mg/dL   HDL 36 (L) >39 mg/dL   VLDL Cholesterol Cal 64 (H) 5 - 40 mg/dL   LDL Calculated 112 (H) 0 - 99 mg/dL   Chol/HDL Ratio 5.9 (H) 0.0 - 5.0 ratio    Comment:                                   T. Chol/HDL Ratio                                             Men  Women                               1/2 Avg.Risk  3.4    3.3  Avg.Risk  5.0    4.4                                2X Avg.Risk  9.6    7.1                                3X Avg.Risk 23.4   11.0   Magnesium     Status: None   Collection Time: 09/30/16  9:13 AM  Result Value Ref Range   Magnesium 1.8 1.6 - 2.3 mg/dL  TSH     Status: None   Collection Time: 09/30/16  9:13 AM  Result Value Ref Range   TSH 2.660 0.450 - 4.500 uIU/mL  Glucose, capillary     Status: Abnormal   Collection Time: 10/18/16 11:20 AM  Result Value Ref Range   Glucose-Capillary 169 (H) 65 - 99 mg/dL  Glucose, capillary     Status: Abnormal   Collection Time: 10/18/16  1:51 PM  Result Value Ref Range   Glucose-Capillary 127 (H) 65 - 99 mg/dL  Basic metabolic panel     Status: Abnormal   Collection Time: 10/28/16  9:31 AM  Result Value Ref Range   Sodium 137 135 - 145 mmol/L   Potassium 5.4 (H) 3.5 - 5.1 mmol/L   Chloride 106 101 - 111 mmol/L   CO2 24 22 - 32 mmol/L   Glucose, Bld 175 (H) 65 - 99 mg/dL   BUN 28 (H) 6 - 20 mg/dL   Creatinine, Ser 1.90  (H) 0.61 - 1.24 mg/dL   Calcium 8.8 (L) 8.9 - 10.3 mg/dL   GFR calc non Af Amer 34 (L) >60 mL/min   GFR calc Af Amer 39 (L) >60 mL/min    Comment: (NOTE) The eGFR has been calculated using the CKD EPI equation. This calculation has not been validated in all clinical situations. eGFR's persistently <60 mL/min signify possible Chronic Kidney Disease.    Anion gap 7 5 - 15  Glucose, capillary     Status: Abnormal   Collection Time: 10/31/16  8:12 AM  Result Value Ref Range   Glucose-Capillary 195 (H) 65 - 99 mg/dL  Glucose, capillary     Status: Abnormal   Collection Time: 10/31/16 11:14 AM  Result Value Ref Range   Glucose-Capillary 186 (H) 65 - 99 mg/dL  BUN     Status: Abnormal   Collection Time: 11/10/16  8:32 AM  Result Value Ref Range   BUN 36 (H) 6 - 20 mg/dL  Creatinine, serum     Status: Abnormal   Collection Time: 11/10/16  8:32 AM  Result Value Ref Range   Creatinine, Ser 2.07 (H) 0.61 - 1.24 mg/dL   GFR calc non Af Amer 30 (L) >60 mL/min   GFR calc Af Amer 35 (L) >60 mL/min    Comment: (NOTE) The eGFR has been calculated using the CKD EPI equation. This calculation has not been validated in all clinical situations. eGFR's persistently <60 mL/min signify possible Chronic Kidney Disease.   Glucose, capillary     Status: Abnormal   Collection Time: 11/14/16  6:59 AM  Result Value Ref Range   Glucose-Capillary 164 (H) 65 - 99 mg/dL  Glucose, capillary     Status: Abnormal   Collection Time: 11/14/16  9:34 AM  Result Value Ref Range   Glucose-Capillary 159 (H) 65 -  99 mg/dL    Radiology No results found.   Assessment/Plan Essential (primary) hypertension blood pressure control important in reducing the progression of atherosclerotic disease. On appropriate oral medications.   Diabetes blood glucose control important in reducing the progression of atherosclerotic disease. Also, involved in wound healing. On appropriate  medications.   Hyperlipidemia lipid control important in reducing the progression of atherosclerotic disease. Continue statin therapy   CAD (coronary artery disease) of artery bypass graft Recent cardiac catheterization demonstrated significant coronary disease as well as a markedly reduced ejection fraction. He would certainly be high risk for any open surgical procedure, but should be able to tolerate an angiogram with moderate sedation.  Intermittent claudication ([place]) The patient is markedly improved after bilateral lower extremity revascularizations. This is very encouraging. Reperfusion swelling is reasonably improved this point. I would anticipate him continuing on aspirin, Plavix, and returning to clinic in about 3 months with noninvasive studies for follow-up.    Leotis Pain, MD  12/27/2016 9:56 AM    This note was created with Dragon medical transcription system.  Any errors from dictation are purely unintentional

## 2016-12-27 NOTE — Patient Instructions (Signed)

## 2016-12-27 NOTE — Assessment & Plan Note (Signed)
The patient is markedly improved after bilateral lower extremity revascularizations. This is very encouraging. Reperfusion swelling is reasonably improved this point. I would anticipate him continuing on aspirin, Plavix, and returning to clinic in about 3 months with noninvasive studies for follow-up.

## 2017-01-20 DIAGNOSIS — E113293 Type 2 diabetes mellitus with mild nonproliferative diabetic retinopathy without macular edema, bilateral: Secondary | ICD-10-CM | POA: Diagnosis not present

## 2017-01-20 DIAGNOSIS — H43811 Vitreous degeneration, right eye: Secondary | ICD-10-CM | POA: Diagnosis not present

## 2017-01-20 LAB — HM DIABETES EYE EXAM

## 2017-01-31 ENCOUNTER — Encounter: Payer: Self-pay | Admitting: Family Medicine

## 2017-01-31 ENCOUNTER — Ambulatory Visit (INDEPENDENT_AMBULATORY_CARE_PROVIDER_SITE_OTHER): Payer: Medicare Other | Admitting: Family Medicine

## 2017-01-31 VITALS — BP 150/84 | HR 70 | Temp 98.1°F | Resp 16 | Ht 69.0 in | Wt 168.0 lb

## 2017-01-31 DIAGNOSIS — Z23 Encounter for immunization: Secondary | ICD-10-CM

## 2017-01-31 DIAGNOSIS — Z87891 Personal history of nicotine dependence: Secondary | ICD-10-CM

## 2017-01-31 DIAGNOSIS — I10 Essential (primary) hypertension: Secondary | ICD-10-CM

## 2017-01-31 DIAGNOSIS — I25810 Atherosclerosis of coronary artery bypass graft(s) without angina pectoris: Secondary | ICD-10-CM | POA: Diagnosis not present

## 2017-01-31 DIAGNOSIS — E1122 Type 2 diabetes mellitus with diabetic chronic kidney disease: Secondary | ICD-10-CM | POA: Diagnosis not present

## 2017-01-31 DIAGNOSIS — N183 Chronic kidney disease, stage 3 unspecified: Secondary | ICD-10-CM

## 2017-01-31 LAB — POCT GLYCOSYLATED HEMOGLOBIN (HGB A1C)
Est. average glucose Bld gHb Est-mCnc: 171
Hemoglobin A1C: 7.6

## 2017-01-31 LAB — POCT UA - MICROALBUMIN: MICROALBUMIN (UR) POC: 100 mg/L

## 2017-01-31 NOTE — Progress Notes (Signed)
Patient: Thomas [name] Male    DOB: 1945-06-15   71 y.o.   MRN: [id number] Visit Date: 01/31/2017  Today's Provider: Lelon Huh, MD   Chief Complaint  Patient presents with  . Follow-up  . Diabetes   Subjective:    HPI   Diabetes Mellitus Type II, Follow-up:   Lab Results  Component Value Date   HGBA1C 7.9 09/30/2016   HGBA1C 8.0 06/03/2016   HGBA1C 8.8 (H) 11/24/2014   Last seen for diabetes 4 months ago.  Management since then includes; no changes. He reports good compliance with treatment.  ----------------------------------------------------------------    Hypertension, follow-up:  BP Readings from Last 3 Encounters:  12/27/16 (!) 201/86  11/14/16 (!) 183/79  10/31/16 (!) 170/67    He was last seen for hypertension 4 months ago.  BP at that visit was 176/68. Management since that visit includes continue same medications as he had taken medications on that day. However SBP has been consistently elevated since then.   Marland KitchenHe reports good compliance with treatment. He is not having side effects.   He is adherent to low salt diet.   Outside blood pressures are with systolics in 542H to 062B. .  Patient denies chest pain, dyspnea, orthopnea, palpitations and syncope.     ----------------------------------------------------------------   Allergies  Allergen Reactions  . Ciprofloxacin Itching and Swelling    Facial swelling      Current Outpatient Prescriptions:  .  acetaminophen (TYLENOL) 500 MG tablet, Take 500 mg by mouth every 8 (eight) hours as needed for mild pain or headache., Disp: , Rfl:  .  aspirin EC 81 MG tablet, Take 81 mg by mouth daily., Disp: , Rfl:  .  atenolol (TENORMIN) 25 MG tablet, TAKE ONE (1) TABLET BY MOUTH EVERY DAY, Disp: 30 tablet, Rfl: 12 .  atorvastatin (LIPITOR) 80 MG tablet, TAKE ONE (1) TABLET BY MOUTH EVERY DAY (Patient taking differently: TAKE ONE (1) TABLET BY MOUTH EVERY IN EVENING), Disp: 30 tablet, Rfl:  5 .  buPROPion (WELLBUTRIN SR) 150 MG 12 hr tablet, 1 tablet daily for 3 days, then 1 tablet twice daily. Stop smoking 14 days after starting medication (Patient not taking: Reported on 10/14/2016), Disp: 60 tablet, Rfl: 5 .  clopidogrel (PLAVIX) 75 MG tablet, Take 1 tablet (75 mg total) by mouth daily., Disp: 30 tablet, Rfl: 11 .  colchicine 0.6 MG tablet, Take 1 tablet (0.6 mg total) by mouth daily. As needed for gout (Patient not taking: Reported on 10/31/2016), Disp: 30 tablet, Rfl: 5 .  Cyanocobalamin (VITAMIN B-12) 5000 MCG SUBL, Place 5,000 mcg under the tongue daily. , Disp: , Rfl:  .  glipiZIDE (GLUCOTROL XL) 10 MG 24 hr tablet, TAKE ONE (1) TABLET BY MOUTH EVERY DAY WITH BREAKFAST, Disp: 30 tablet, Rfl: 12 .  glucose blood test strip, , Disp: , Rfl:  .  lisinopril-hydrochlorothiazide (PRINZIDE,ZESTORETIC) 20-12.5 MG tablet, TAKE ONE (1) TABLET BY MOUTH EVERY DAY, Disp: 30 tablet, Rfl: 12 .  metFORMIN (GLUCOPHAGE-XR) 500 MG 24 hr tablet, TAKE TWO TABLETS BY MOUTH EVERY DAY, Disp: 60 tablet, Rfl: 12 .  pantoprazole (PROTONIX) 40 MG tablet, Take 1 tablet (40 mg total) by mouth daily., Disp: 30 tablet, Rfl: 12 .  triamcinolone cream (KENALOG) 0.1 %, , Disp: , Rfl:   Review of Systems  Cardiovascular: Negative for chest pain, palpitations and leg swelling.  Gastrointestinal: Negative for abdominal pain.  Neurological: Negative for weakness and numbness.    Social  History  Substance Use Topics  . Smoking status: Current Every Day Smoker    Packs/day: 0.50    Years: 51.00    Types: Cigarettes  . Smokeless tobacco: Never Used  . Alcohol use 0.0 oz/week     Comment:  2 beers a month   Objective:   BP (!) 150/84 (BP Location: Right Arm, Cuff Size: Normal)   Pulse 70   Temp 98.1 F (36.7 C) (Oral)   Resp 16   Ht 5\' 9"  (1.753 m)   Wt 168 lb (76.2 kg)   SpO2 96%   BMI 24.81 kg/m  There were no vitals filed for this visit.   Physical Exam   General Appearance:    Alert,  cooperative, no distress  Eyes:    PERRL, conjunctiva/corneas clear, EOM's intact       Lungs:     Clear to auscultation bilaterally, respirations unlabored  Heart:    Regular rate and rhythm  Neurologic:   Awake, alert, oriented x 3. No apparent focal neurological           defect.       Results for orders placed or performed in visit on 01/31/17  POCT glycosylated hemoglobin (Hb A1C)  Result Value Ref Range   Hemoglobin A1C 7.6    Est. average glucose Bld gHb Est-mCnc 171   POCT UA - Microalbumin  Result Value Ref Range   Microalbumin Ur, POC 100 mg/L       Assessment & Plan:     1. Type 2 diabetes mellitus with stage 3 chronic kidney disease, without long-term current use of insulin ([place]) Fairly well controlled. Continue current medications.  Not candidate for Jardiance due to CKD.  - POCT glycosylated hemoglobin (Hb A1C) - POCT UA - Microalbumin  2. Need for influenza vaccination  - Flu vaccine HIGH DOSE PF  3. Essential (primary) hypertension systolic have been elevated, will change lisinpril-hctz to 20-25 and atenolol to metoprolol with next refill due on 02-17-17.   4. History of smoking 30 or more pack years He still has bupropion prescription and plans to fill soon.   5. 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 Compliant with medication.  Continue aggressive risk factor modification.  Continue regular follow up Dr. Saralyn Pilar.   Return in about 3 months (around 05/03/2017).       Lelon Huh, MD  [facility]

## 2017-02-09 ENCOUNTER — Telehealth: Payer: Self-pay | Admitting: Family Medicine

## 2017-02-09 MED ORDER — LISINOPRIL-HYDROCHLOROTHIAZIDE 20-25 MG PO TABS: 1.0000 | ORAL_TABLET | Freq: Every day | ORAL | 5 refills | Status: DC

## 2017-02-09 MED ORDER — METOPROLOL SUCCINATE ER 25 MG PO TB24: 25.0000 mg | ORAL_TABLET | Freq: Every day | ORAL | 5 refills | Status: DC

## 2017-02-09 NOTE — Telephone Encounter (Signed)
Change in dose of lisinopril and change from atenolol to metoprolol per most recent office visit.

## 2017-02-21 ENCOUNTER — Other Ambulatory Visit: Payer: Self-pay | Admitting: Family Medicine

## 2017-03-01 DIAGNOSIS — Z872 Personal history of diseases of the skin and subcutaneous tissue: Secondary | ICD-10-CM | POA: Diagnosis not present

## 2017-03-01 DIAGNOSIS — L578 Other skin changes due to chronic exposure to nonionizing radiation: Secondary | ICD-10-CM | POA: Diagnosis not present

## 2017-03-01 DIAGNOSIS — Z859 Personal history of malignant neoplasm, unspecified: Secondary | ICD-10-CM | POA: Diagnosis not present

## 2017-03-01 DIAGNOSIS — L570 Actinic keratosis: Secondary | ICD-10-CM | POA: Diagnosis not present

## 2017-03-21 ENCOUNTER — Ambulatory Visit (INDEPENDENT_AMBULATORY_CARE_PROVIDER_SITE_OTHER): Payer: Medicare Other | Admitting: Vascular Surgery

## 2017-03-21 ENCOUNTER — Ambulatory Visit (INDEPENDENT_AMBULATORY_CARE_PROVIDER_SITE_OTHER): Payer: Medicare Other

## 2017-03-21 ENCOUNTER — Encounter (INDEPENDENT_AMBULATORY_CARE_PROVIDER_SITE_OTHER): Payer: Self-pay | Admitting: Vascular Surgery

## 2017-03-21 VITALS — BP 175/93 | HR 74 | Resp 16 | Wt 164.0 lb

## 2017-03-21 DIAGNOSIS — I10 Essential (primary) hypertension: Secondary | ICD-10-CM

## 2017-03-21 DIAGNOSIS — N183 Chronic kidney disease, stage 3 unspecified: Secondary | ICD-10-CM

## 2017-03-21 DIAGNOSIS — I739 Peripheral vascular disease, unspecified: Secondary | ICD-10-CM

## 2017-03-21 DIAGNOSIS — E1122 Type 2 diabetes mellitus with diabetic chronic kidney disease: Secondary | ICD-10-CM | POA: Diagnosis not present

## 2017-03-21 DIAGNOSIS — I25810 Atherosclerosis of coronary artery bypass graft(s) without angina pectoris: Secondary | ICD-10-CM | POA: Diagnosis not present

## 2017-03-21 DIAGNOSIS — E785 Hyperlipidemia, unspecified: Secondary | ICD-10-CM | POA: Diagnosis not present

## 2017-03-21 NOTE — Patient Instructions (Signed)

## 2017-03-21 NOTE — Assessment & Plan Note (Signed)
Markedly improved after intervention earlier this year. His noninvasive studies today show a normal ABI of 0.98 on the right and 0.93 on the left with good waveforms.  Duplex show mild to moderate stenosis in the proximal SFAs bilaterally, with patent stents below these areas.  His digital pressures are near normal on the right and normal on the left. He is currently tolerating his medical regimen well and we discussed whether or not to stop Plavix today.  We decided on continuing both aspirin and Plavix.  He will also continue his Lipitor.  I will see him back in 6 months for noninvasive studies or sooner if problems develop in the interim.

## 2017-03-21 NOTE — Progress Notes (Signed)
MRN : [id number]  Thomas [name] is a 71 y.o. (03/09/1946) male who presents with chief complaint of  Chief Complaint  Patient presents with  . Follow-up    2-9mo abi,bil le art  .  History of Present Illness: Patient returns today in follow up of PAD.  He is doing well.  Earlier this year, he underwent bilateral lower extremity revascularization.  He is doing well with only mild claudication symptoms at current.  He denies any ischemic rest pain or ulceration.  His noninvasive studies today show a normal ABI of 0.98 on the right and 0.93 on the left with good waveforms.  Duplex show mild to moderate stenosis in the proximal SFAs bilaterally, with patent stents below these areas.  His digital pressures are near normal on the right and normal on the left.  Current Outpatient Medications  Medication Sig Dispense Refill  . acetaminophen (TYLENOL) 500 MG tablet Take 500 mg by mouth every 8 (eight) hours as needed for mild pain or headache.    Marland Kitchen aspirin EC 81 MG tablet Take 81 mg by mouth daily.    Marland Kitchen atorvastatin (LIPITOR) 80 MG tablet TAKE ONE TABLET BY MOUTH EVERY DAY 30 tablet 5  . clopidogrel (PLAVIX) 75 MG tablet Take 1 tablet (75 mg total) by mouth daily. 30 tablet 11  . clotrimazole-betamethasone (LOTRISONE) cream Apply 1 application topically 2 (two) times daily.    . colchicine 0.6 MG tablet Take 1 tablet (0.6 mg total) by mouth daily. As needed for gout 30 tablet 5  . glipiZIDE (GLUCOTROL XL) 10 MG 24 hr tablet TAKE ONE (1) TABLET BY MOUTH EVERY DAY WITH BREAKFAST 30 tablet 12  . glucose blood test strip     . lisinopril-hydrochlorothiazide (PRINZIDE,ZESTORETIC) 20-25 MG tablet Take 1 tablet by mouth daily. 30 tablet 5  . metFORMIN (GLUCOPHAGE-XR) 500 MG 24 hr tablet TAKE TWO TABLETS BY MOUTH EVERY DAY 60 tablet 12  . pantoprazole (PROTONIX) 40 MG tablet Take 1 tablet (40 mg total) by mouth daily. 30 tablet 12  . terbinafine (LAMISIL) 250 MG tablet Take 250 mg by mouth daily.    Marland Kitchen  buPROPion (WELLBUTRIN SR) 150 MG 12 hr tablet 1 tablet daily for 3 days, then 1 tablet twice daily. Stop smoking 14 days after starting medication (Patient not taking: Reported on 10/14/2016) 60 tablet 5  . Cyanocobalamin (VITAMIN B-12) 5000 MCG SUBL Place 5,000 mcg under the tongue daily.     . metoprolol succinate (TOPROL-XL) 25 MG 24 hr tablet Take 1 tablet (25 mg total) by mouth daily. (Patient not taking: Reported on 03/21/2017) 30 tablet 5  . triamcinolone cream (KENALOG) 0.1 %      No current facility-administered medications for this visit.     Past Medical History:  Diagnosis Date  . Allergy   . Arthritis   . CAD (coronary artery disease)   . Diabetes mellitus without complication ([place])   . Erosive esophagitis   . GERD (gastroesophageal reflux disease)   . Gouty arthropathy   . History of colonic polyps   . History of kidney stones   . History of MRSA infection   . Hyperkalemia   . Hyperlipidemia   . Hypertension   . Melanoma ([place])   . Myocardial infarction ([place]) 1986  . Peripheral vascular disease ([place])   . 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 Trigger finger of left hand   . Ureteral tumor  Past Surgical History:  Procedure Laterality Date  . CARDIAC CATHETERIZATION    . CATARACT EXTRACTION    . CORONARY ARTERY BYPASS GRAFT  1996  . EXCISION Ureteral Tumor    . EYE SURGERY Bilateral    Cataract Extraction with IOL  . LEFT HEART CATH AND CORONARY ANGIOGRAPHY N/A 10/18/2016   Procedure: Left Heart Cath and Coronary Angiography;  Surgeon: Isaias Cowman, MD;  Location: [facility];  Service: Cardiovascular;  Laterality: N/A;  . LOWER EXTREMITY ANGIOGRAPHY Right 10/31/2016   Procedure: Lower Extremity Angiography;  Surgeon: Algernon Huxley, MD;  Location: [facility];  Service: Cardiovascular;  Laterality: Right;  . LOWER EXTREMITY ANGIOGRAPHY Left 11/14/2016   Procedure: Lower Extremity Angiography;  Surgeon: Algernon Huxley, MD;   Location: [facility];  Service: Cardiovascular;  Laterality: Left;  . LOWER EXTREMITY INTERVENTION  11/14/2016   Procedure: Lower Extremity Intervention;  Surgeon: Algernon Huxley, MD;  Location: [facility];  Service: Cardiovascular;;  . RETINAL DETACHMENT SURGERY Right November 2107    Social History        Social History  Substance Use Topics  . Smoking status: Current Every Day Smoker    Packs/day: 0.50    Years: 51.00    Types: Cigarettes  . Smokeless tobacco: Never Used  . Alcohol use 0.0 oz/week      Comment:  2 beers a month    Family History      Family History  Problem Relation Age of Onset  . Alzheimer's disease Mother   . Alzheimer's disease Brother   . Lung cancer Paternal Grandfather          Allergies  Allergen Reactions  . Ciprofloxacin Itching and Swelling    Facial swelling      REVIEW OF SYSTEMS (Negative unless checked)  Constitutional: [] Weight loss  [] Fever  [] Chills Cardiac: [] Chest pain   [] Chest pressure   [] Palpitations   [] Shortness of breath when laying flat   [] Shortness of breath at rest   [] Shortness of breath with exertion. Vascular:  [x] Pain in legs with walking   [] Pain in legs at rest   [] Pain in legs when laying flat   [x] Claudication   [] Pain in feet when walking  [] Pain in feet at rest  [] Pain in feet when laying flat   [] History of DVT   [] Phlebitis   [x] Swelling in legs   [] Varicose veins   [] Non-healing ulcers Pulmonary:   [] Uses home oxygen   [] Productive cough   [] Hemoptysis   [] Wheeze  [] COPD   [] Asthma Neurologic:  [] Dizziness  [] Blackouts   [] Seizures   [] History of stroke   [] History of TIA  [] Aphasia   [] Temporary blindness   [] Dysphagia   [] Weakness or numbness in arms   [] Weakness or numbness in legs Musculoskeletal:  [x] Arthritis   [] Joint swelling   [] Joint pain   [] Low back pain Hematologic:  [] Easy bruising  [] Easy bleeding   [] Hypercoagulable state   [] Anemic   Gastrointestinal:   [] Blood in stool   [] Vomiting blood  [] Gastroesophageal reflux/heartburn   [] Abdominal pain Genitourinary:  [] Chronic kidney disease   [] Difficult urination  [] Frequent urination  [] Burning with urination   [] Hematuria Skin:  [] Rashes   [] Ulcers   [] Wounds Psychological:  [] History of anxiety   []  History of major depression.     Physical Examination  BP (!) 175/93 (BP Location: Right Arm)   Pulse 74   Resp 16   Wt 164 lb (  74.4 kg)   BMI 24.22 kg/m  Gen:  WD/WN, NAD Head: [place]/AT, No temporalis wasting. Ear/Nose/Throat: Hearing grossly intact, nares w/o erythema or drainage, trachea midline Eyes: Conjunctiva clear. Sclera non-icteric Neck: Supple.  No JVD.  Pulmonary:  Good air movement, no use of accessory muscles.  Cardiac: RRR, normal S1, S2 Vascular:  Vessel Right Left  Radial Palpable Palpable                      Popliteal Palpable Palpable  PT Palpable Palpable  DP Palpable  trace palpable    Musculoskeletal: M/S 5/5 throughout.  No deformity or atrophy.  No significant lower extremity edema. Neurologic: Sensation grossly intact in extremities.  Symmetrical.  Speech is fluent.  Psychiatric: Judgment intact, Mood & affect appropriate for pt's clinical situation. Dermatologic: No rashes or ulcers noted.  No cellulitis or open wounds.       Labs Recent Results (from the past 2160 hour(s))  HM DIABETES EYE EXAM     Status: Abnormal   Collection Time: 01/20/17 12:00 AM  Result Value Ref Range   HM Diabetic Eye Exam Retinopathy (A) No Retinopathy    Comment: no treatment indicated  POCT glycosylated hemoglobin (Hb A1C)     Status: None   Collection Time: 01/31/17  8:27 AM  Result Value Ref Range   Hemoglobin A1C 7.6    Est. average glucose Bld gHb Est-mCnc 171   POCT UA - Microalbumin     Status: None   Collection Time: 01/31/17  8:27 AM  Result Value Ref Range   Microalbumin Ur, POC 100 mg/L  VAS [place] LOWER EXTREMITY ARTERIAL DUPLEX     Status: None (In  process)   Collection Time: 03/21/17 10:46 AM  Result Value Ref Range   Left super femoral prox sys PSV 158 cm/s   Left super femoral mid sys PSV 65 cm/s   Left super femoral dist sys PSV -73 cm/s   Left popliteal prox sys PSV 92 cm/s   Left popliteal dist sys PSV -67 cm/s   Right super femoral prox sys PSV 84 cm/s   Right super femoral mid sys PSV -58 cm/s   Right super femoral dist sys PSV -82 cm/s   Right popliteal prox sys PSV 71 cm/s   Right popliteal dist sys PSV 62 cm/s   Right post tibial sys PSV 76 cm/s   Right peroneal sys PSV 133 cm/s   Left ant tibial distal sys 36 cm/s   left post tibial dist sys -97 cm/s   LEFT PERO DIST SYS 73.00 cm/s   RIGHT ANT DIST TIBAL SYS PSV 97 cm/s   RIGHT POST TIB DIST SYS 77 cm/s    Radiology No results found.   Assessment/Plan Essential (primary) hypertension blood pressure control important in reducing the progression of atherosclerotic disease. On appropriate oral medications.   Diabetes blood glucose control important in reducing the progression of atherosclerotic disease. Also, involved in wound healing. On appropriate medications.   Hyperlipidemia lipid control important in reducing the progression of atherosclerotic disease. Continue statin therapy   CAD (coronary artery disease) of artery bypass graft Recent cardiac catheterization demonstrated significant coronary disease as well as a markedly reduced ejection fraction. He would certainly be high risk for any open surgical procedure, but should be able to tolerate an angiogram with moderate sedation.    Intermittent claudication (HCC) Markedly improved after intervention earlier this year. His noninvasive studies today show a normal ABI of 0.98  on the right and 0.93 on the left with good waveforms.  Duplex show mild to moderate stenosis in the proximal SFAs bilaterally, with patent stents below these areas.  His digital pressures are near normal on the right and  normal on the left. He is currently tolerating his medical regimen well and we discussed whether or not to stop Plavix today.  We decided on continuing both aspirin and Plavix.  He will also continue his Lipitor.  I will see him back in 6 months for noninvasive studies or sooner if problems develop in the interim.    Leotis Pain, MD  03/21/2017 2:23 PM    This note was created with Dragon medical transcription system.  Any errors from dictation are purely unintentional

## 2017-04-18 DIAGNOSIS — Z992 Dependence on renal dialysis: Secondary | ICD-10-CM

## 2017-04-18 DIAGNOSIS — N186 End stage renal disease: Secondary | ICD-10-CM

## 2017-04-18 HISTORY — DX: Dependence on renal dialysis: Z99.2

## 2017-04-18 HISTORY — DX: End stage renal disease: N18.6

## 2017-04-20 ENCOUNTER — Telehealth: Payer: Self-pay

## 2017-04-20 DIAGNOSIS — L570 Actinic keratosis: Secondary | ICD-10-CM | POA: Diagnosis not present

## 2017-04-20 NOTE — Telephone Encounter (Signed)
LMTCB and schedule AWV (if interested) with myself for 2019. -MM

## 2017-05-04 ENCOUNTER — Ambulatory Visit: Payer: Self-pay | Admitting: Family Medicine

## 2017-05-04 DIAGNOSIS — R600 Localized edema: Secondary | ICD-10-CM | POA: Diagnosis not present

## 2017-05-04 DIAGNOSIS — I25810 Atherosclerosis of coronary artery bypass graft(s) without angina pectoris: Secondary | ICD-10-CM | POA: Diagnosis not present

## 2017-05-04 DIAGNOSIS — I10 Essential (primary) hypertension: Secondary | ICD-10-CM | POA: Diagnosis not present

## 2017-05-04 DIAGNOSIS — R0602 Shortness of breath: Secondary | ICD-10-CM | POA: Diagnosis not present

## 2017-05-04 DIAGNOSIS — E782 Mixed hyperlipidemia: Secondary | ICD-10-CM | POA: Diagnosis not present

## 2017-05-24 ENCOUNTER — Inpatient Hospital Stay
Admission: EM | Admit: 2017-05-24 | Discharge: 2017-05-30 | DRG: 247 | Disposition: A | Discharge status: Home / Self Care | Payer: Medicare Other | Attending: Internal Medicine | Admitting: Internal Medicine

## 2017-05-24 ENCOUNTER — Emergency Department: Payer: Medicare Other

## 2017-05-24 ENCOUNTER — Other Ambulatory Visit: Payer: Self-pay

## 2017-05-24 DIAGNOSIS — N179 Acute kidney failure, unspecified: Secondary | ICD-10-CM | POA: Diagnosis present

## 2017-05-24 DIAGNOSIS — D631 Anemia in chronic kidney disease: Secondary | ICD-10-CM | POA: Diagnosis not present

## 2017-05-24 DIAGNOSIS — E875 Hyperkalemia: Secondary | ICD-10-CM | POA: Diagnosis not present

## 2017-05-24 DIAGNOSIS — N189 Chronic kidney disease, unspecified: Secondary | ICD-10-CM | POA: Diagnosis present

## 2017-05-24 DIAGNOSIS — E785 Hyperlipidemia, unspecified: Secondary | ICD-10-CM | POA: Diagnosis present

## 2017-05-24 DIAGNOSIS — E1129 Type 2 diabetes mellitus with other diabetic kidney complication: Secondary | ICD-10-CM | POA: Diagnosis not present

## 2017-05-24 DIAGNOSIS — R0602 Shortness of breath: Secondary | ICD-10-CM | POA: Diagnosis not present

## 2017-05-24 DIAGNOSIS — I214 Non-ST elevation (NSTEMI) myocardial infarction: Principal | ICD-10-CM | POA: Diagnosis present

## 2017-05-24 DIAGNOSIS — Z79899 Other long term (current) drug therapy: Secondary | ICD-10-CM | POA: Diagnosis not present

## 2017-05-24 DIAGNOSIS — E1151 Type 2 diabetes mellitus with diabetic peripheral angiopathy without gangrene: Secondary | ICD-10-CM | POA: Diagnosis not present

## 2017-05-24 DIAGNOSIS — E119 Type 2 diabetes mellitus without complications: Secondary | ICD-10-CM | POA: Diagnosis not present

## 2017-05-24 DIAGNOSIS — F17210 Nicotine dependence, cigarettes, uncomplicated: Secondary | ICD-10-CM | POA: Diagnosis present

## 2017-05-24 DIAGNOSIS — I5022 Chronic systolic (congestive) heart failure: Secondary | ICD-10-CM | POA: Diagnosis present

## 2017-05-24 DIAGNOSIS — Z7982 Long term (current) use of aspirin: Secondary | ICD-10-CM

## 2017-05-24 DIAGNOSIS — Z7902 Long term (current) use of antithrombotics/antiplatelets: Secondary | ICD-10-CM | POA: Diagnosis not present

## 2017-05-24 DIAGNOSIS — I10 Essential (primary) hypertension: Secondary | ICD-10-CM | POA: Diagnosis present

## 2017-05-24 DIAGNOSIS — F17200 Nicotine dependence, unspecified, uncomplicated: Secondary | ICD-10-CM | POA: Diagnosis present

## 2017-05-24 DIAGNOSIS — R079 Chest pain, unspecified: Secondary | ICD-10-CM | POA: Diagnosis present

## 2017-05-24 DIAGNOSIS — Z888 Allergy status to other drugs, medicaments and biological substances status: Secondary | ICD-10-CM | POA: Diagnosis not present

## 2017-05-24 DIAGNOSIS — E1122 Type 2 diabetes mellitus with diabetic chronic kidney disease: Secondary | ICD-10-CM | POA: Diagnosis not present

## 2017-05-24 DIAGNOSIS — Z7984 Long term (current) use of oral hypoglycemic drugs: Secondary | ICD-10-CM | POA: Diagnosis not present

## 2017-05-24 DIAGNOSIS — E1139 Type 2 diabetes mellitus with other diabetic ophthalmic complication: Secondary | ICD-10-CM | POA: Diagnosis present

## 2017-05-24 DIAGNOSIS — I130 Hypertensive heart and chronic kidney disease with heart failure and stage 1 through stage 4 chronic kidney disease, or unspecified chronic kidney disease: Secondary | ICD-10-CM | POA: Diagnosis present

## 2017-05-24 DIAGNOSIS — I2510 Atherosclerotic heart disease of native coronary artery without angina pectoris: Secondary | ICD-10-CM | POA: Diagnosis not present

## 2017-05-24 DIAGNOSIS — N184 Chronic kidney disease, stage 4 (severe): Secondary | ICD-10-CM | POA: Diagnosis not present

## 2017-05-24 LAB — CBC
HEMATOCRIT: 24.9 % — AB (ref 40.0–52.0)
HEMOGLOBIN: 8.3 g/dL — AB (ref 13.0–18.0)
MCH: 29.4 pg (ref 26.0–34.0)
MCHC: 33.4 g/dL (ref 32.0–36.0)
MCV: 88.1 fL (ref 80.0–100.0)
Platelets: 403 10*3/uL (ref 150–440)
RBC: 2.83 MIL/uL — ABNORMAL LOW (ref 4.40–5.90)
RDW: 14.7 % — ABNORMAL HIGH (ref 11.5–14.5)
WBC: 10.9 10*3/uL — ABNORMAL HIGH (ref 3.8–10.6)

## 2017-05-24 LAB — BASIC METABOLIC PANEL
ANION GAP: 11 (ref 5–15)
BUN: 38 mg/dL — ABNORMAL HIGH (ref 6–20)
CO2: 17 mmol/L — ABNORMAL LOW (ref 22–32)
Calcium: 8.5 mg/dL — ABNORMAL LOW (ref 8.9–10.3)
Chloride: 109 mmol/L (ref 101–111)
Creatinine, Ser: 2.66 mg/dL — ABNORMAL HIGH (ref 0.61–1.24)
GFR calc Af Amer: 26 mL/min — ABNORMAL LOW (ref >60)
GFR, EST NON AFRICAN AMERICAN: 22 mL/min — AB (ref >60)
GLUCOSE: 242 mg/dL — AB (ref 65–99)
POTASSIUM: 4.8 mmol/L (ref 3.5–5.1)
Sodium: 137 mmol/L (ref 135–145)

## 2017-05-24 LAB — APTT: APTT: 36 s (ref 24–36)

## 2017-05-24 LAB — PROTIME-INR
INR: 1.06
Prothrombin Time: 13.7 seconds (ref 11.4–15.2)

## 2017-05-24 LAB — TROPONIN I: Troponin I: 0.35 ng/mL (ref <0.03)

## 2017-05-24 MED ORDER — HEPARIN (PORCINE) IN NACL 100-0.45 UNIT/ML-% IJ SOLN: 10.0000 [IU]/kg/h | Freq: Once | INTRAMUSCULAR | Status: DC

## 2017-05-24 MED ORDER — HEPARIN (PORCINE) IN NACL 100-0.45 UNIT/ML-% IJ SOLN
1050.0000 [IU]/h | INTRAMUSCULAR | Status: DC
  Administered 2017-05-25: 900 [IU]/h via INTRAVENOUS
  Administered 2017-05-26: 1050 [IU]/h via INTRAVENOUS
  Filled 2017-05-24 (×3): qty 250

## 2017-05-24 MED ORDER — ACETAMINOPHEN 650 MG RE SUPP: 650.0000 mg | Freq: Four times a day (QID) | RECTAL | Status: DC | PRN

## 2017-05-24 MED ORDER — HEPARIN SODIUM (PORCINE) 5000 UNIT/ML IJ SOLN
4000.0000 [IU] | Freq: Once | INTRAMUSCULAR | Status: AC
  Administered 2017-05-24: 4000 [IU] via INTRAVENOUS
  Filled 2017-05-24: qty 1

## 2017-05-24 MED ORDER — ACETAMINOPHEN 325 MG PO TABS
650.0000 mg | ORAL_TABLET | Freq: Four times a day (QID) | ORAL | Status: DC | PRN
  Administered 2017-05-25 (×3): 650 mg via ORAL
  Filled 2017-05-24 (×3): qty 2

## 2017-05-24 MED ORDER — HEPARIN SODIUM (PORCINE) 5000 UNIT/ML IJ SOLN
4000.0000 [IU] | Freq: Once | INTRAMUSCULAR | Status: DC
Start: 2017-05-24 — End: 2017-05-24

## 2017-05-24 NOTE — Progress Notes (Signed)
ANTICOAGULATION CONSULT NOTE - Initial Consult  Pharmacy Consult for heparin drip Indication: chest pain/ACS  Allergies  Allergen Reactions  . Ciprofloxacin Itching and Swelling    Facial swelling     Patient Measurements: Height: 5\' 6"  (167.6 cm) Weight: 166 lb (75.3 kg) IBW/kg (Calculated) : 63.8 Heparin Dosing Weight: 75kg  Vital Signs: Temp: 98 F (36.7 C) (02/06 2115) Temp Source: Oral (02/06 2115) BP: 163/91 (02/06 2115) Pulse Rate: 95 (02/06 2115)  Labs: Recent Labs    05/24/17 2126  HGB 8.3*  HCT 24.9*  PLT 403  CREATININE 2.66*  TROPONINI 0.35*    Estimated Creatinine Clearance: 22.3 mL/min (A) (by C-G formula based on SCr of 2.66 mg/dL (H)).   Medical History: Past Medical History:  Diagnosis Date  . Allergy   . Arthritis   . CAD (coronary artery disease)   . Diabetes mellitus without complication ([place])   . Erosive esophagitis   . GERD (gastroesophageal reflux disease)   . Gouty arthropathy   . History of colonic polyps   . History of kidney stones   . History of MRSA infection   . Hyperkalemia   . Hyperlipidemia   . Hypertension   . Melanoma ([place])   . Myocardial infarction ([place]) 1986  . Peripheral vascular disease ([place])   . 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 Trigger finger of left hand   . Ureteral tumor     Medications:  No anticoagulation in PTA meds.  Assessment: Trop 0.35  Goal of Therapy:  Heparin level 0.3-0.7 units/ml Monitor platelets by anticoagulation protocol: Yes   Plan:  4000 unit bolus and initial rate of 900 units/hr. First heparin level 8 hours after start of infusion.  Kalayla Shadden S 05/24/2017,10:59 PM

## 2017-05-24 NOTE — ED Notes (Signed)
ED Provider at bedside. 

## 2017-05-24 NOTE — ED Notes (Signed)
Patient transported to X-ray 

## 2017-05-24 NOTE — ED Notes (Signed)
Pharmacy tech notified of heparin order for this pt, states she will contact pharmacy

## 2017-05-24 NOTE — ED Provider Notes (Addendum)
[facility] Emergency Department Provider Note   ____________________________________________   First MD Initiated Contact with Patient 05/24/17 2128     (approximate)  I have reviewed the triage vital signs and the nursing notes.   HISTORY  Chief Complaint Chest Pain    HPI Thomas [name] is a 72 y.o. male he reports for the last week she's been having increasing amounts of shortness of breath with exertion and when he walks up a hill he will get some chest tightness. He was sitting on the couch today getting ready to watch movie when he got sudden onset of chest heaviness with sweating and shortness of breath. He did not have any nausea. The pain radiated towards his left arm. It was very heavy. EMS was called and they gave him aspirin and the pain got better rapidly after the aspirin. It is now gone except for dull achiness.   Past Medical History:  Diagnosis Date  . Allergy   . Arthritis   . CAD (coronary artery disease)   . Diabetes mellitus without complication ([place])   . Erosive esophagitis   . GERD (gastroesophageal reflux disease)   . Gouty arthropathy   . History of colonic polyps   . History of kidney stones   . History of MRSA infection   . Hyperkalemia   . Hyperlipidemia   . Hypertension   . Melanoma ([place])   . Myocardial infarction ([place]) 1986  . Peripheral vascular disease ([place])   . 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 Trigger finger of left hand   . Ureteral tumor     Patient Active Problem List   Diagnosis Date Noted  . CAD (coronary artery disease) of artery bypass graft 10/25/2016  . Abnormal ankle brachial index (ABI) 10/13/2016  . Intermittent claudication ([place]) 09/30/2016  . PVC (premature ventricular contraction) 09/30/2016  . Hyperlipidemia 09/30/2016  . Aortic atherosclerosis ([place]) 06/17/2016  . Duodenitis 11/24/2014  . Erosive esophagitis 11/24/2014  . Personal history of methicillin  resistant Staphylococcus aureus 11/24/2014  . High potassium 11/24/2014  . Melanoma of skin ([place]) 11/24/2014  . Acquired trigger finger 11/24/2014  . 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 DM (diabetes mellitus), type 2 with renal complications ([place]) 24/23/5361  . History of smoking 30 or more pack years 08/26/2009  . Allergic rhinitis 08/06/2006  . 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 History of colon polyps 05/09/2003  . 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 Essential (primary) hypertension 06/09/1995    Past Surgical History:  Procedure Laterality Date  . CARDIAC CATHETERIZATION    . CATARACT EXTRACTION    . CORONARY ARTERY BYPASS GRAFT  1996  . EXCISION Ureteral Tumor    . EYE SURGERY Bilateral    Cataract Extraction with IOL  . LEFT HEART CATH AND CORONARY ANGIOGRAPHY N/A 10/18/2016   Procedure: Left Heart Cath and Coronary Angiography;  Surgeon: Isaias Cowman, MD;  Location: [facility];  Service: Cardiovascular;  Laterality: N/A;  . LOWER EXTREMITY ANGIOGRAPHY Right 10/31/2016   Procedure: Lower Extremity Angiography;  Surgeon: Algernon Huxley, MD;  Location: [facility];  Service: Cardiovascular;  Laterality: Right;  . LOWER EXTREMITY ANGIOGRAPHY Left 11/14/2016   Procedure: Lower Extremity Angiography;  Surgeon: Algernon Huxley, MD;  Location: [facility];  Service: Cardiovascular;  Laterality: Left;  . LOWER EXTREMITY INTERVENTION  11/14/2016   Procedure: Lower Extremity  Intervention;  Surgeon: Algernon Huxley, MD;  Location: [facility];  Service: Cardiovascular;;  . RETINAL DETACHMENT SURGERY Right November 2107    Prior to Admission medications   Medication Sig Start Date End Date Taking? Authorizing Provider  acetaminophen (TYLENOL) 500 MG tablet Take 500 mg by mouth every 8 (eight) hours as needed for mild pain or headache.    [provider]  aspirin EC 81 MG tablet Take 81  mg by mouth daily.    [provider]  atorvastatin (LIPITOR) 80 MG tablet TAKE ONE TABLET BY MOUTH EVERY DAY 02/21/17   Birdie Sons, MD  buPROPion [facility] SR) 150 MG 12 hr tablet 1 tablet daily for 3 days, then 1 tablet twice daily. Stop smoking 14 days after starting medication Patient not taking: Reported on 10/14/2016 06/03/16 10/01/16  Birdie Sons, MD  clopidogrel (PLAVIX) 75 MG tablet Take 1 tablet (75 mg total) by mouth daily. 10/31/16   Algernon Huxley, MD  clotrimazole-betamethasone (LOTRISONE) cream Apply 1 application topically 2 (two) times daily.    [provider]  colchicine 0.6 MG tablet Take 1 tablet (0.6 mg total) by mouth daily. As needed for gout 06/03/16   Birdie Sons, MD  Cyanocobalamin (VITAMIN B-12) 5000 MCG SUBL Place 5,000 mcg under the tongue daily.     [provider]  glipiZIDE (GLUCOTROL XL) 10 MG 24 hr tablet TAKE ONE (1) TABLET BY MOUTH EVERY DAY WITH BREAKFAST 10/25/16   Birdie Sons, MD  glucose blood test strip  12/05/08   [provider]  lisinopril-hydrochlorothiazide (PRINZIDE,ZESTORETIC) 20-25 MG tablet Take 1 tablet by mouth daily. 02/09/17   Birdie Sons, MD  metFORMIN (GLUCOPHAGE-XR) 500 MG 24 hr tablet TAKE TWO TABLETS BY MOUTH EVERY DAY 11/28/16   Birdie Sons, MD  metoprolol succinate (TOPROL-XL) 25 MG 24 hr tablet Take 1 tablet (25 mg total) by mouth daily. Patient not taking: Reported on 03/21/2017 02/09/17   Birdie Sons, MD  pantoprazole (PROTONIX) 40 MG tablet Take 1 tablet (40 mg total) by mouth daily. 06/03/16   Birdie Sons, MD  terbinafine (LAMISIL) 250 MG tablet Take 250 mg by mouth daily.    [provider]  triamcinolone cream (KENALOG) 0.1 %  12/12/16   [provider]    Allergies Ciprofloxacin  Family History  Problem Relation Age of Onset  . Alzheimer's disease Mother   . Alzheimer's disease Brother   . Lung cancer Paternal Grandfather     Social  History Social History   Tobacco Use  . Smoking status: Current Every Day Smoker    Packs/day: 1.00    Years: 51.00    Pack years: 51.00    Types: Cigarettes  . Smokeless tobacco: Never Used  Substance Use Topics  . Alcohol use: Yes    Alcohol/week: 0.0 oz    Comment:  2 beers a month  . Drug use: No    Review of Systems  Constitutional: No fever/chills Eyes: No visual changes. ENT: No sore throat. Cardiovascular: see history of present illness Respiratory: see history of present illness Gastrointestinal: No abdominal pain.  No nausea, no vomiting.  No diarrhea.  No constipation. Genitourinary: Negative for dysuria. Musculoskeletal: Negative for back pain. Skin: Negative for rash. Neurological: Negative for headaches, focal weakness   ____________________________________________   PHYSICAL EXAM:  VITAL SIGNS: ED Triage Vitals  Enc Vitals Group     BP 05/24/17 2115 (!) 163/91  Pulse Rate 05/24/17 2115 95     Resp 05/24/17 2115 (!) 23     Temp 05/24/17 2115 98 F (36.7 C)     Temp Source 05/24/17 2115 Oral     SpO2 05/24/17 2115 100 %     Weight 05/24/17 2120 166 lb (75.3 kg)     Height 05/24/17 2120 5\' 6"  (1.676 m)     Head Circumference --      Peak Flow --      Pain Score 05/24/17 2116 5     Pain Loc --      Pain Edu? --      Excl. in [place]? --     Constitutional: Alert and oriented. Well appearing and in no acute distress. Eyes: Conjunctivae are normal.  Head: Atraumatic. Nose: No congestion/rhinnorhea. Mouth/Throat: Mucous membranes are moist.  Oropharynx non-erythematous. Neck: No stridor.   Cardiovascular: Normal rate, regular rhythm. Grossly normal heart sounds.  Good peripheral circulation. Respiratory: Normal respiratory effort.  No retractions. Lungs CTAB. Gastrointestinal: Soft and nontender. No distention. No abdominal bruits. No CVA tenderness. Musculoskeletal: No lower extremity tenderness nor edema.  No joint effusions. Neurologic:   Normal speech and language. No gross focal neurologic deficits are appreciated. No gait instability. Skin:  Skin is warm, dry and intact. No rash noted. Psychiatric: Mood and affect are normal. Speech and behavior are normal.  ____________________________________________   LABS (all labs ordered are listed, but only abnormal results are displayed)  Labs Reviewed  BASIC METABOLIC PANEL - Abnormal; Notable for the following components:      Result Value   CO2 17 (*)    Glucose, Bld 242 (*)    BUN 38 (*)    Creatinine, Ser 2.66 (*)    Calcium 8.5 (*)    GFR calc non Af Amer 22 (*)    GFR calc Af Amer 26 (*)    All other components within normal limits  CBC - Abnormal; Notable for the following components:   WBC 10.9 (*)    RBC 2.83 (*)    Hemoglobin 8.3 (*)    HCT 24.9 (*)    RDW 14.7 (*)    All other components within normal limits  TROPONIN I - Abnormal; Notable for the following components:   Troponin I 0.35 (*)    All other components within normal limits   ____________________________________________  EKG  EKG read and interpreted by me shows sinus tachycardia at a rate of 103. Appears to be first-degree AV block. His right bundle branch block and left anterior hemiblock. There may be a small amount of ST segment depression in V4 and 5. This is new from previous EKG. ____________________________________________  RADIOLOGY  ED MD interpretation: chest x-ray shows no acute disease.  Official radiology report(s): Dg Chest 2 View  Result Date: 05/24/2017 CLINICAL DATA:  Left chest pain. EXAM: CHEST  2 VIEW COMPARISON:  None. FINDINGS: Cardiomegaly. The hila and mediastinum are normal. No pulmonary nodules or masses. No focal infiltrates. No overt edema. IMPRESSION: No active cardiopulmonary disease. Electronically Signed   By: Dorise Bullion III M.D   On: 05/24/2017 21:55    ____________________________________________   PROCEDURES  Procedure(s) performed:    Procedures  Critical Care performed:   ____________________________________________   INITIAL IMPRESSION / ASSESSMENT AND PLAN / ED COURSE  patient still having just a little bit of achy chest pain. If him aspirin and then start him on some heparin. Get him in the hospital.  ____________________________________________   FINAL CLINICAL IMPRESSION(S) / ED DIAGNOSES  Final diagnoses:  NSTEMI (non-ST elevated myocardial infarction) [facility])     ED Discharge Orders    None       Note:  This document was prepared using Dragon voice recognition software and may include unintentional dictation errors.    Nena Polio, MD 05/24/17 2223    Nena Polio, MD 05/24/17 2234    Nena Polio, MD 05/24/17 2234

## 2017-05-24 NOTE — ED Triage Notes (Signed)
Per EMS, pt from home with complaints central to left CP with radiation to left shoulder and back. Pt has hx of bypass in 1996, denies stents. Pt states hx of 2 MI's, denies SHOB, nausea or dizziness. Pt given 4 baby aspirin and reports CP relief from 10 to 5 on pain scale. Pt A&O and in NAD at this time, able to answer questions. Pt states cardiac cath 3 mths ago.

## 2017-05-24 NOTE — H&P (Signed)
[facility] [place] NAME: Thomas [name]    MR#:  [id number]  DATE OF BIRTH:  07-06-1945  DATE OF ADMISSION:  05/24/2017  PRIMARY CARE PHYSICIAN: Birdie Sons, MD   REQUESTING/REFERRING PHYSICIAN: Cinda Quest, MD  CHIEF COMPLAINT:   Chief Complaint  Patient presents with  . Chest Pain    HISTORY OF PRESENT ILLNESS:  Thomas [name]  is a 72 y.o. male who presents with recurring and increasing episodes of chest pain.  Patient states that he had an episode earlier today which was the most severe that it has been.  It is central chest pain, pressure-like in nature, radiated to his shoulder blades.  He has a prior history of heart disease with some known blockages.  Here in the ED his troponin was elevated at 0.35.  Heparin drip was started and hospitalist were called for admission  PAST MEDICAL HISTORY:   Past Medical History:  Diagnosis Date  . Allergy   . Arthritis   . CAD (coronary artery disease)   . Diabetes mellitus without complication ([place])   . Erosive esophagitis   . GERD (gastroesophageal reflux disease)   . Gouty arthropathy   . History of colonic polyps   . History of kidney stones   . History of MRSA infection   . Hyperkalemia   . Hyperlipidemia   . Hypertension   . Melanoma ([place])   . Myocardial infarction ([place]) 1986  . Peripheral vascular disease ([place])   . 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 Trigger finger of left hand   . Ureteral tumor     PAST SURGICAL HISTORY:   Past Surgical History:  Procedure Laterality Date  . CARDIAC CATHETERIZATION    . CATARACT EXTRACTION    . CORONARY ARTERY BYPASS GRAFT  1996  . EXCISION Ureteral Tumor    . EYE SURGERY Bilateral    Cataract Extraction with IOL  . LEFT HEART CATH AND CORONARY ANGIOGRAPHY N/A 10/18/2016   Procedure: Left Heart Cath and Coronary Angiography;  Surgeon: Isaias Cowman, MD;  Location: [facility];  Service:  Cardiovascular;  Laterality: N/A;  . LOWER EXTREMITY ANGIOGRAPHY Right 10/31/2016   Procedure: Lower Extremity Angiography;  Surgeon: Algernon Huxley, MD;  Location: [facility];  Service: Cardiovascular;  Laterality: Right;  . LOWER EXTREMITY ANGIOGRAPHY Left 11/14/2016   Procedure: Lower Extremity Angiography;  Surgeon: Algernon Huxley, MD;  Location: [facility];  Service: Cardiovascular;  Laterality: Left;  . LOWER EXTREMITY INTERVENTION  11/14/2016   Procedure: Lower Extremity Intervention;  Surgeon: Algernon Huxley, MD;  Location: [facility];  Service: Cardiovascular;;  . RETINAL DETACHMENT SURGERY Right November 2107    SOCIAL HISTORY:   Social History   Tobacco Use  . Smoking status: Current Every Day Smoker    Packs/day: 1.00    Years: 51.00    Pack years: 51.00    Types: Cigarettes  . Smokeless tobacco: Never Used  Substance Use Topics  . Alcohol use: Yes    Alcohol/week: 0.0 oz    Comment:  2 beers a month    FAMILY HISTORY:   Family History  Problem Relation Age of Onset  . Alzheimer's disease Mother   . Alzheimer's disease Brother   . Lung cancer Paternal Grandfather     DRUG ALLERGIES:   Allergies  Allergen Reactions  . Ciprofloxacin Itching and Swelling  Facial swelling     MEDICATIONS AT HOME:   Prior to Admission medications   Medication Sig Start Date End Date Taking? Authorizing Provider  acetaminophen (TYLENOL) 500 MG tablet Take 500 mg by mouth every 8 (eight) hours as needed for mild pain or headache.   Yes [provider]  aspirin EC 81 MG tablet Take 81 mg by mouth daily.   Yes [provider]  atorvastatin (LIPITOR) 80 MG tablet TAKE ONE TABLET BY MOUTH EVERY DAY 02/21/17  Yes Birdie Sons, MD  clopidogrel (PLAVIX) 75 MG tablet Take 1 tablet (75 mg total) by mouth daily. 10/31/16  Yes Dew, Erskine Squibb, MD  Cyanocobalamin (VITAMIN B-12) 5000 MCG SUBL Place 5,000 mcg under the tongue daily.    Yes [provider]  glipiZIDE (GLUCOTROL XL) 10 MG 24 hr tablet TAKE ONE (1) TABLET BY MOUTH EVERY DAY WITH BREAKFAST 10/25/16  Yes Birdie Sons, MD  lisinopril-hydrochlorothiazide (PRINZIDE,ZESTORETIC) 20-25 MG tablet Take 1 tablet by mouth daily. 02/09/17  Yes Birdie Sons, MD  metFORMIN (GLUCOPHAGE-XR) 500 MG 24 hr tablet TAKE TWO TABLETS BY MOUTH EVERY DAY 11/28/16  Yes Birdie Sons, MD  metoprolol succinate (TOPROL-XL) 25 MG 24 hr tablet Take 1 tablet (25 mg total) by mouth daily. 02/09/17  Yes Birdie Sons, MD  pantoprazole (PROTONIX) 40 MG tablet Take 1 tablet (40 mg total) by mouth daily. 06/03/16  Yes Birdie Sons, MD  buPROPion [facility] SR) 150 MG 12 hr tablet 1 tablet daily for 3 days, then 1 tablet twice daily. Stop smoking 14 days after starting medication Patient not taking: Reported on 10/14/2016 06/03/16 10/01/16  Birdie Sons, MD  clotrimazole-betamethasone (LOTRISONE) cream Apply 1 application topically 2 (two) times daily.    [provider]  colchicine 0.6 MG tablet Take 1 tablet (0.6 mg total) by mouth daily. As needed for gout Patient not taking: Reported on 05/24/2017 06/03/16   Birdie Sons, MD  glucose blood test strip  12/05/08   [provider]  terbinafine (LAMISIL) 250 MG tablet Take 250 mg by mouth daily.    [provider]  triamcinolone cream (KENALOG) 0.1 %  12/12/16   [provider]    REVIEW OF SYSTEMS:  Review of Systems  Constitutional: Negative for chills, fever, malaise/fatigue and weight loss.  HENT: Negative for ear pain, hearing loss and tinnitus.   Eyes: Negative for blurred vision, double vision, pain and redness.  Respiratory: Negative for cough, hemoptysis and shortness of breath.   Cardiovascular: Positive for chest pain. Negative for palpitations, orthopnea and leg swelling.  Gastrointestinal: Negative for abdominal pain, constipation, diarrhea, nausea and vomiting.  Genitourinary: Negative  for dysuria, frequency and hematuria.  Musculoskeletal: Negative for back pain, joint pain and neck pain.  Skin:       No acne, rash, or lesions  Neurological: Negative for dizziness, tremors, focal weakness and weakness.  Endo/Heme/Allergies: Negative for polydipsia. Does not bruise/bleed easily.  Psychiatric/Behavioral: Negative for depression. The patient is not nervous/anxious and does not have insomnia.      VITAL SIGNS:   Vitals:   05/24/17 2115 05/24/17 2120 05/24/17 2230  BP: (!) 163/91  (!) 147/86  Pulse: 95  92  Resp: (!) 23  (!) 21  Temp: 98 F (36.7 C)    TempSrc: Oral    SpO2: 100%  100%  Weight:  75.3 kg (166 lb)   Height:  5\' 6"  (1.676 m)    Wt  Readings from Last 3 Encounters:  05/24/17 75.3 kg (166 lb)  03/21/17 74.4 kg (164 lb)  01/31/17 76.2 kg (168 lb)    PHYSICAL EXAMINATION:  Physical Exam  Vitals reviewed. Constitutional: He is oriented to person, place, and time. He appears well-developed and well-nourished. No distress.  HENT:  Head: Normocephalic and atraumatic.  Mouth/Throat: Oropharynx is clear and moist.  Eyes: Conjunctivae and EOM are normal. Pupils are equal, round, and reactive to light. No scleral icterus.  Neck: Normal range of motion. Neck supple. No JVD present. No thyromegaly present.  Cardiovascular: Normal rate, regular rhythm and intact distal pulses. Exam reveals no gallop and no friction rub.  No murmur heard. Respiratory: Effort normal and breath sounds normal. No respiratory distress. He has no wheezes. He has no rales.  GI: Soft. Bowel sounds are normal. He exhibits no distension. There is no tenderness.  Musculoskeletal: Normal range of motion. He exhibits no edema.  No arthritis, no gout  Lymphadenopathy:    He has no cervical adenopathy.  Neurological: He is alert and oriented to person, place, and time. No cranial nerve deficit.  No dysarthria, no aphasia  Skin: Skin is warm and dry. No rash noted. No erythema.   Psychiatric: He has a normal mood and affect. His behavior is normal. Judgment and thought content normal.    LABORATORY PANEL:   CBC Recent Labs  Lab 05/24/17 2126  WBC 10.9*  HGB 8.3*  HCT 24.9*  PLT 403   ------------------------------------------------------------------------------------------------------------------  Chemistries  Recent Labs  Lab 05/24/17 2126  NA 137  K 4.8  CL 109  CO2 17*  GLUCOSE 242*  BUN 38*  CREATININE 2.66*  CALCIUM 8.5*   ------------------------------------------------------------------------------------------------------------------  Cardiac Enzymes Recent Labs  Lab 05/24/17 2126  TROPONINI 0.35*   ------------------------------------------------------------------------------------------------------------------  RADIOLOGY:  Dg Chest 2 View  Result Date: 05/24/2017 CLINICAL DATA:  Left chest pain. EXAM: CHEST  2 VIEW COMPARISON:  None. FINDINGS: Cardiomegaly. The hila and mediastinum are normal. No pulmonary nodules or masses. No focal infiltrates. No overt edema. IMPRESSION: No active cardiopulmonary disease. Electronically Signed   By: Dorise Bullion III M.D   On: 05/24/2017 21:55    EKG:   Orders placed or performed during the hospital encounter of 05/24/17  . ED EKG within 10 minutes  . ED EKG within 10 minutes  . EKG 12-[place]  . EKG 12-[place]    IMPRESSION AND PLAN:  Principal Problem:   Chest pain -with elevated troponin.  Heparin drip started.  We will trend his troponins tonight and get an echocardiogram and cardiology consult tomorrow Active Problems:   CAD (coronary artery disease) of artery bypass graft -continue home meds, heparin drip as above, other workup as above   DM (diabetes mellitus), type 2 with renal complications (HCC) -sliding scale insulin with corresponding glucose checks   Essential (primary) hypertension -continue home meds   Acute on chronic renal failure (HCC) -not far from patient's baseline,  avoid nephrotoxins and monitor   Hyperlipidemia -continue home meds  All the records are reviewed and case discussed with ED provider. Management plans discussed with the patient and/or family.  DVT PROPHYLAXIS: Systemic anticoagulation  GI PROPHYLAXIS: None  ADMISSION STATUS: Observation  CODE STATUS: Full Code Status History    Date Active Date Inactive Code Status Order ID Comments User Context   11/14/2016 09:38 11/14/2016 14:42 Full Code [id number]  Algernon Huxley, MD Inpatient   10/31/2016 11:21 10/31/2016 18:17 Full Code [id number]  Algernon Huxley,  MD Inpatient   10/18/2016 13:57 10/18/2016 18:50 Full Code [id number]  Isaias Cowman, MD Inpatient      TOTAL TIME TAKING CARE OF THIS PATIENT: 45 minutes.   [place] Evitt [place] 05/24/2017, 11:09 PM  [employer]  [phone]  CC: Primary care physician; Birdie Sons, MD  Note:  This document was prepared using Dragon voice recognition software and may include unintentional dictation errors.

## 2017-05-25 ENCOUNTER — Observation Stay
Admit: 2017-05-25 | Discharge: 2017-05-25 | Disposition: A | Discharge status: Home / Self Care | Payer: Medicare Other | Attending: Internal Medicine | Admitting: Internal Medicine

## 2017-05-25 DIAGNOSIS — E1122 Type 2 diabetes mellitus with diabetic chronic kidney disease: Secondary | ICD-10-CM | POA: Diagnosis present

## 2017-05-25 DIAGNOSIS — F17200 Nicotine dependence, unspecified, uncomplicated: Secondary | ICD-10-CM | POA: Diagnosis present

## 2017-05-25 DIAGNOSIS — N184 Chronic kidney disease, stage 4 (severe): Secondary | ICD-10-CM | POA: Diagnosis present

## 2017-05-25 DIAGNOSIS — D631 Anemia in chronic kidney disease: Secondary | ICD-10-CM | POA: Diagnosis present

## 2017-05-25 DIAGNOSIS — I130 Hypertensive heart and chronic kidney disease with heart failure and stage 1 through stage 4 chronic kidney disease, or unspecified chronic kidney disease: Secondary | ICD-10-CM | POA: Diagnosis present

## 2017-05-25 DIAGNOSIS — F17210 Nicotine dependence, cigarettes, uncomplicated: Secondary | ICD-10-CM | POA: Diagnosis present

## 2017-05-25 DIAGNOSIS — Z79899 Other long term (current) drug therapy: Secondary | ICD-10-CM | POA: Diagnosis not present

## 2017-05-25 DIAGNOSIS — Z7902 Long term (current) use of antithrombotics/antiplatelets: Secondary | ICD-10-CM | POA: Diagnosis not present

## 2017-05-25 DIAGNOSIS — N179 Acute kidney failure, unspecified: Secondary | ICD-10-CM | POA: Diagnosis present

## 2017-05-25 DIAGNOSIS — I214 Non-ST elevation (NSTEMI) myocardial infarction: Secondary | ICD-10-CM | POA: Diagnosis present

## 2017-05-25 DIAGNOSIS — Z7982 Long term (current) use of aspirin: Secondary | ICD-10-CM | POA: Diagnosis not present

## 2017-05-25 DIAGNOSIS — I5022 Chronic systolic (congestive) heart failure: Secondary | ICD-10-CM | POA: Diagnosis present

## 2017-05-25 DIAGNOSIS — E785 Hyperlipidemia, unspecified: Secondary | ICD-10-CM | POA: Diagnosis present

## 2017-05-25 DIAGNOSIS — Z888 Allergy status to other drugs, medicaments and biological substances status: Secondary | ICD-10-CM | POA: Diagnosis not present

## 2017-05-25 DIAGNOSIS — E875 Hyperkalemia: Secondary | ICD-10-CM | POA: Diagnosis not present

## 2017-05-25 DIAGNOSIS — Z7984 Long term (current) use of oral hypoglycemic drugs: Secondary | ICD-10-CM | POA: Diagnosis not present

## 2017-05-25 DIAGNOSIS — E1151 Type 2 diabetes mellitus with diabetic peripheral angiopathy without gangrene: Secondary | ICD-10-CM | POA: Diagnosis present

## 2017-05-25 LAB — CBC
HEMATOCRIT: 23.8 % — AB (ref 40.0–52.0)
Hemoglobin: 7.9 g/dL — ABNORMAL LOW (ref 13.0–18.0)
MCH: 29.2 pg (ref 26.0–34.0)
MCHC: 33.4 g/dL (ref 32.0–36.0)
MCV: 87.6 fL (ref 80.0–100.0)
Platelets: 377 10*3/uL (ref 150–440)
RBC: 2.72 MIL/uL — ABNORMAL LOW (ref 4.40–5.90)
RDW: 14.8 % — ABNORMAL HIGH (ref 11.5–14.5)
WBC: 9.7 10*3/uL (ref 3.8–10.6)

## 2017-05-25 LAB — GLUCOSE, CAPILLARY
GLUCOSE-CAPILLARY: 100 mg/dL — AB (ref 65–99)
Glucose-Capillary: 110 mg/dL — ABNORMAL HIGH (ref 65–99)
Glucose-Capillary: 198 mg/dL — ABNORMAL HIGH (ref 65–99)
Glucose-Capillary: 135 mg/dL — ABNORMAL HIGH (ref 65–99)
Glucose-Capillary: 183 mg/dL — ABNORMAL HIGH (ref 65–99)

## 2017-05-25 LAB — HEPARIN LEVEL (UNFRACTIONATED)
HEPARIN UNFRACTIONATED: 0.38 [IU]/mL (ref 0.30–0.70)
Heparin Unfractionated: 0.25 IU/mL — ABNORMAL LOW (ref 0.30–0.70)

## 2017-05-25 LAB — TROPONIN I
TROPONIN I: 0.58 ng/mL — AB (ref <0.03)
TROPONIN I: 0.55 ng/mL — AB (ref <0.03)
Troponin I: 0.44 ng/mL (ref <0.03)

## 2017-05-25 LAB — ECHOCARDIOGRAM COMPLETE
Height: 66 in
Weight: 2656 oz

## 2017-05-25 LAB — BASIC METABOLIC PANEL
ANION GAP: 9 (ref 5–15)
BUN: 38 mg/dL — ABNORMAL HIGH (ref 6–20)
CHLORIDE: 112 mmol/L — AB (ref 101–111)
CO2: 16 mmol/L — ABNORMAL LOW (ref 22–32)
Calcium: 8.3 mg/dL — ABNORMAL LOW (ref 8.9–10.3)
Creatinine, Ser: 2.62 mg/dL — ABNORMAL HIGH (ref 0.61–1.24)
GFR calc Af Amer: 26 mL/min — ABNORMAL LOW (ref >60)
GFR calc non Af Amer: 23 mL/min — ABNORMAL LOW (ref >60)
Glucose, Bld: 93 mg/dL (ref 65–99)
POTASSIUM: 4.7 mmol/L (ref 3.5–5.1)
SODIUM: 137 mmol/L (ref 135–145)

## 2017-05-25 MED ORDER — LISINOPRIL-HYDROCHLOROTHIAZIDE 20-25 MG PO TABS: 1.0000 | ORAL_TABLET | Freq: Every day | ORAL | Status: DC

## 2017-05-25 MED ORDER — LISINOPRIL 20 MG PO TABS
20.0000 mg | ORAL_TABLET | Freq: Every day | ORAL | Status: DC
  Administered 2017-05-25: 20 mg via ORAL
  Filled 2017-05-25: qty 1

## 2017-05-25 MED ORDER — ATORVASTATIN CALCIUM 20 MG PO TABS
80.0000 mg | ORAL_TABLET | Freq: Every day | ORAL | Status: DC
  Administered 2017-05-25 – 2017-05-30 (×6): 80 mg via ORAL
  Filled 2017-05-25 (×6): qty 4

## 2017-05-25 MED ORDER — SODIUM CHLORIDE 0.9 % IV SOLN
INTRAVENOUS | Status: AC
  Administered 2017-05-25: 21:00:00 via INTRAVENOUS

## 2017-05-25 MED ORDER — NITROGLYCERIN 2 % TD OINT
1.0000 [in_us] | TOPICAL_OINTMENT | Freq: Four times a day (QID) | TRANSDERMAL | Status: DC
  Administered 2017-05-25 – 2017-05-26 (×4): 1 [in_us] via TOPICAL
  Filled 2017-05-25 (×4): qty 1

## 2017-05-25 MED ORDER — HYDROCHLOROTHIAZIDE 25 MG PO TABS: 25.0000 mg | ORAL_TABLET | Freq: Every day | ORAL | Status: DC

## 2017-05-25 MED ORDER — CLOPIDOGREL BISULFATE 75 MG PO TABS: 75.0000 mg | ORAL_TABLET | Freq: Every day | ORAL | Status: DC

## 2017-05-25 MED ORDER — PANTOPRAZOLE SODIUM 40 MG PO TBEC
40.0000 mg | DELAYED_RELEASE_TABLET | Freq: Every day | ORAL | Status: DC
  Administered 2017-05-25 – 2017-05-30 (×5): 40 mg via ORAL
  Filled 2017-05-25 (×5): qty 1

## 2017-05-25 MED ORDER — HYDROCODONE-ACETAMINOPHEN 5-325 MG PO TABS
1.0000 | ORAL_TABLET | ORAL | Status: DC | PRN
  Administered 2017-05-25 – 2017-05-28 (×7): 1 via ORAL
  Filled 2017-05-25 (×7): qty 1

## 2017-05-25 MED ORDER — ASPIRIN EC 81 MG PO TBEC
81.0000 mg | DELAYED_RELEASE_TABLET | Freq: Every day | ORAL | Status: DC
  Administered 2017-05-25 – 2017-05-30 (×6): 81 mg via ORAL
  Filled 2017-05-25 (×6): qty 1

## 2017-05-25 MED ORDER — INSULIN ASPART 100 UNIT/ML ~~LOC~~ SOLN
0.0000 [IU] | Freq: Three times a day (TID) | SUBCUTANEOUS | Status: DC
  Administered 2017-05-25: 1 [IU] via SUBCUTANEOUS
  Administered 2017-05-25: 2 [IU] via SUBCUTANEOUS
  Administered 2017-05-26: 1 [IU] via SUBCUTANEOUS
  Administered 2017-05-26: 2 [IU] via SUBCUTANEOUS
  Administered 2017-05-26: 1 [IU] via SUBCUTANEOUS
  Administered 2017-05-27: 2 [IU] via SUBCUTANEOUS
  Administered 2017-05-27 (×2): 1 [IU] via SUBCUTANEOUS
  Administered 2017-05-28: 2 [IU] via SUBCUTANEOUS
  Administered 2017-05-28: 1 [IU] via SUBCUTANEOUS
  Administered 2017-05-29: 2 [IU] via SUBCUTANEOUS
  Administered 2017-05-30: 1 [IU] via SUBCUTANEOUS
  Filled 2017-05-25 (×12): qty 1

## 2017-05-25 MED ORDER — METOPROLOL SUCCINATE ER 25 MG PO TB24
25.0000 mg | ORAL_TABLET | Freq: Every day | ORAL | Status: DC
  Administered 2017-05-25 – 2017-05-26 (×2): 25 mg via ORAL
  Filled 2017-05-25 (×2): qty 1

## 2017-05-25 MED ORDER — HEPARIN BOLUS VIA INFUSION
1100.0000 [IU] | Freq: Once | INTRAVENOUS | Status: AC
  Administered 2017-05-25: 1100 [IU] via INTRAVENOUS
  Filled 2017-05-25: qty 1100

## 2017-05-25 MED ORDER — RAMELTEON 8 MG PO TABS
8.0000 mg | ORAL_TABLET | Freq: Every day | ORAL | Status: DC
  Administered 2017-05-25 – 2017-05-29 (×5): 8 mg via ORAL
  Filled 2017-05-25 (×5): qty 1

## 2017-05-25 MED ORDER — INSULIN ASPART 100 UNIT/ML ~~LOC~~ SOLN: 0.0000 [IU] | Freq: Every day | SUBCUTANEOUS | Status: DC

## 2017-05-25 MED ORDER — ONDANSETRON HCL 4 MG/2ML IJ SOLN
4.0000 mg | Freq: Four times a day (QID) | INTRAMUSCULAR | Status: DC | PRN
  Administered 2017-05-28: 4 mg via INTRAVENOUS
  Filled 2017-05-25: qty 2

## 2017-05-25 MED ORDER — MORPHINE SULFATE (PF) 2 MG/ML IV SOLN
1.0000 mg | Freq: Once | INTRAVENOUS | Status: AC
  Administered 2017-05-25: 1 mg via INTRAVENOUS
  Filled 2017-05-25: qty 1

## 2017-05-25 MED ORDER — ONDANSETRON HCL 4 MG PO TABS: 4.0000 mg | ORAL_TABLET | Freq: Four times a day (QID) | ORAL | Status: DC | PRN

## 2017-05-25 NOTE — Consult Note (Signed)
Cardiology Consultation Note    Patient ID: Thomas [name], MRN: [id number], DOB/AGE: 1945-12-30 72 y.o. Admit date: 05/24/2017   Date of Consult: 05/25/2017 Primary Physician: Birdie Sons, MD Primary Cardiologist: Dr. Saralyn Pilar  Chief Complaint: Chest pain Reason for Consultation: Chest pain with elevated troponin Requesting MD: Dr. Fritzi Mandes  HPI: Thomas [name] is a 72 y.o. male with history of coronary artery disease status post single-vessel coronary artery bypass grafting in 1996 at the [facility].  He had a saphenous vein graft to the RCA at that time.  He also has a history of hypertension, hyperlipidemia, diabetes as well as ischemic cardiomyopathy and peripheral vascular disease.  His current ejection fraction is 35-40% with inferior and inferolateral hypokinesis.  He also has peripheral vascular disease status post PTA of the right peroneal, tibial peroneal and left common iliac with a stent to the right SFA in July 2018.  Just prior to his peripheral vascular intervention he underwent left heart catheterization which revealed 100% RCA with an occluded saphenous vein graft to the RCA, he had 100% left circumflex with a 70% stenosis in the mid LAD.  Prior to that he had had a functional study showing inferior scar with mild to moderate lateral wall ischemia.  Discussion regarding redo coronary artery bypass grafting versus medical therapy was discussed.  He was felt to be fairly high risk for PCI at that time.  Medical therapy was recommended.  He has done fairly well although recently has noted chest discomfort in the midsternal region.  This can occur with exertion or with heavy lifting.  He has been treated with aspirin and Plavix as well as lisinopril-hydrochlorothiazide, oral agents for diabetes.  He does not smoke.  He is a previous smoker and quit approximately 12 years ago.  He drinks a mild to moderate amount of alcohol.  He is able to carry out daily activities limited by in  general.  He presented to the emergency room with worsening chest pain and had a mild serum troponin elevation.  His initial troponin was 0.35 it is increased to 0.58.  He was placed on heparin.  He has been taking Plavix as an outpatient.  Echocardiogram revealed ef 35-40% with inferolateral hypokinesis.  He has acute on chronic renal insufficiency.  His serum creatinine increased to 2.66 on admission.  His baseline appears to be 1.8.  He is hemodynamically stable at present.  Past Medical History:  Diagnosis Date  . Allergy   . Arthritis   . CAD (coronary artery disease)   . Diabetes mellitus without complication ([place])   . Erosive esophagitis   . GERD (gastroesophageal reflux disease)   . Gouty arthropathy   . History of colonic polyps   . History of kidney stones   . History of MRSA infection   . Hyperkalemia   . Hyperlipidemia   . Hypertension   . Melanoma ([place])   . Myocardial infarction ([place]) 1986  . Peripheral vascular disease ([place])   . 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 Trigger finger of left hand   . Ureteral tumor       Surgical History:  Past Surgical History:  Procedure Laterality Date  . CARDIAC CATHETERIZATION    . CATARACT EXTRACTION    . CORONARY ARTERY BYPASS GRAFT  1996  . EXCISION Ureteral Tumor    . EYE SURGERY Bilateral    Cataract Extraction with IOL  . LEFT HEART CATH  AND CORONARY ANGIOGRAPHY N/A 10/18/2016   Procedure: Left Heart Cath and Coronary Angiography;  Surgeon: Isaias Cowman, MD;  Location: [place] [facility];  Service: Cardiovascular;  Laterality: N/A;  . LOWER EXTREMITY ANGIOGRAPHY Right 10/31/2016   Procedure: Lower Extremity Angiography;  Surgeon: Algernon Huxley, MD;  Location: [place] [facility];  Service: Cardiovascular;  Laterality: Right;  . LOWER EXTREMITY ANGIOGRAPHY Left 11/14/2016   Procedure: Lower Extremity Angiography;  Surgeon: Algernon Huxley, MD;  Location: [place] [facility];  Service:  Cardiovascular;  Laterality: Left;  . LOWER EXTREMITY INTERVENTION  11/14/2016   Procedure: Lower Extremity Intervention;  Surgeon: Algernon Huxley, MD;  Location: [place] [facility];  Service: Cardiovascular;;  . RETINAL DETACHMENT SURGERY Right November 2107     Home Meds: Prior to Admission medications   Medication Sig Start Date End Date Taking? Authorizing Provider  acetaminophen (TYLENOL) 500 MG tablet Take 500 mg by mouth every 8 (eight) hours as needed for mild pain or headache.   Yes [provider]  aspirin EC 81 MG tablet Take 81 mg by mouth daily.   Yes [provider]  atorvastatin (LIPITOR) 80 MG tablet TAKE ONE TABLET BY MOUTH EVERY DAY 02/21/17  Yes Birdie Sons, MD  clopidogrel (PLAVIX) 75 MG tablet Take 1 tablet (75 mg total) by mouth daily. 10/31/16  Yes Dew, Erskine Squibb, MD  Cyanocobalamin (VITAMIN B-12) 5000 MCG SUBL Place 5,000 mcg under the tongue daily.    Yes [provider]  glipiZIDE (GLUCOTROL XL) 10 MG 24 hr tablet TAKE ONE (1) TABLET BY MOUTH EVERY DAY WITH BREAKFAST 10/25/16  Yes Birdie Sons, MD  lisinopril-hydrochlorothiazide (PRINZIDE,ZESTORETIC) 20-25 MG tablet Take 1 tablet by mouth daily. 02/09/17  Yes Birdie Sons, MD  metFORMIN (GLUCOPHAGE-XR) 500 MG 24 hr tablet TAKE TWO TABLETS BY MOUTH EVERY DAY 11/28/16  Yes Birdie Sons, MD  metoprolol succinate (TOPROL-XL) 25 MG 24 hr tablet Take 1 tablet (25 mg total) by mouth daily. 02/09/17  Yes Birdie Sons, MD  pantoprazole (PROTONIX) 40 MG tablet Take 1 tablet (40 mg total) by mouth daily. 06/03/16  Yes Birdie Sons, MD  buPROPion [facility] SR) 150 MG 12 hr tablet 1 tablet daily for 3 days, then 1 tablet twice daily. Stop smoking 14 days after starting medication Patient not taking: Reported on 10/14/2016 06/03/16 10/01/16  Birdie Sons, MD  clotrimazole-betamethasone (LOTRISONE) cream Apply 1 application topically 2 (two) times daily.    [provider]   colchicine 0.6 MG tablet Take 1 tablet (0.6 mg total) by mouth daily. As needed for gout Patient not taking: Reported on 05/24/2017 06/03/16   Birdie Sons, MD  glucose blood test strip  12/05/08   [provider]  terbinafine (LAMISIL) 250 MG tablet Take 250 mg by mouth daily.    [provider]  triamcinolone cream (KENALOG) 0.1 %  12/12/16   [provider]    Inpatient Medications:  . aspirin EC  81 mg Oral Daily  . atorvastatin  80 mg Oral Daily  . insulin aspart  0-5 Units Subcutaneous QHS  . insulin aspart  0-9 Units Subcutaneous TID WC  . metoprolol succinate  25 mg Oral Daily  . nitroGLYCERIN  1 inch Topical Q6H  . pantoprazole  40 mg Oral Daily   . heparin 1,050 Units/hr (05/25/17 9935)    Allergies:  Allergies  Allergen Reactions  . Ciprofloxacin Itching and Swelling    Facial  swelling     Social History   Socioeconomic History  . Marital status: Married    Spouse name: Not on file  . Number of children: 1  . Years of education: Not on file  . Highest education level: Not on file  Social Needs  . Financial resource strain: Not on file  . Food insecurity - worry: Not on file  . Food insecurity - inability: Not on file  . Transportation needs - medical: Not on file  . Transportation needs - non-medical: Not on file  Occupational History  . Occupation: Retired/Part-Time    Comment: [employer]  Tobacco Use  . Smoking status: Current Every Day Smoker    Packs/day: 1.00    Years: 51.00    Pack years: 51.00    Types: Cigarettes  . Smokeless tobacco: Never Used  Substance and Sexual Activity  . Alcohol use: Yes    Alcohol/week: 0.0 oz    Comment:  2 beers a month  . Drug use: No  . Sexual activity: Not on file  Other Topics Concern  . Not on file  Social History Narrative  . Not on file     Family History  Problem Relation Age of Onset  . Alzheimer's disease Mother   . Alzheimer's disease Brother   . Lung  cancer Paternal Grandfather      Review of Systems: A 12-system review of systems was performed and is negative except as noted in the HPI.  Labs: Recent Labs    05/24/17 2126 05/24/17 2352 05/25/17 0550  TROPONINI 0.35* 0.44* 0.58*   Lab Results  Component Value Date   WBC 9.7 05/25/2017   HGB 7.9 (L) 05/25/2017   HCT 23.8 (L) 05/25/2017   MCV 87.6 05/25/2017   PLT 377 05/25/2017    Recent Labs  Lab 05/25/17 0550  NA 137  K 4.7  CL 112*  CO2 16*  BUN 38*  CREATININE 2.62*  CALCIUM 8.3*  GLUCOSE 93   Lab Results  Component Value Date   CHOL 212 (H) 09/30/2016   HDL 36 (L) 09/30/2016   LDLCALC 112 (H) 09/30/2016   TRIG 322 (H) 09/30/2016   No results found for: DDIMER  Radiology/Studies:  Dg Chest 2 View  Result Date: 05/24/2017 CLINICAL DATA:  Left chest pain. EXAM: CHEST  2 VIEW COMPARISON:  None. FINDINGS: Cardiomegaly. The hila and mediastinum are normal. No pulmonary nodules or masses. No focal infiltrates. No overt edema. IMPRESSION: No active cardiopulmonary disease. 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  He has PVCs.  No ischemic changes noted  Physical Exam: Blood pressure (!) 150/81, pulse 95, temperature (!) 97.4 F (36.3 C), temperature source Oral, resp. rate 18, height 5\' 6"  (1.676 m), weight 75.3 kg (166 lb), SpO2 99 %. Body mass index is 26.79 kg/m. General: Well developed, well nourished, in no acute distress. Head: Normocephalic, atraumatic, sclera non-icteric, no xanthomas, nares are without discharge.  Neck: Negative for carotid bruits. JVD not elevated. Lungs: Clear bilaterally to auscultation without wheezes, rales, or rhonchi. Breathing is unlabored. Heart: RRR with S1 S2.  1/6 to 2/6  systolic murmur radiating to the left lower sternal border. Abdomen: Soft, non-tender,  non-distended with normoactive bowel sounds. No hepatomegaly. No rebound/guarding. No obvious abdominal masses. Msk:  Strength and tone appear normal for age. Extremities: No clubbing or cyanosis. No edema.  Distal pedal pulses are +/-  and equal bilaterally. Neuro: Alert and oriented X 3. No facial asymmetry. No focal deficit. Moves all extremities spontaneously. Psych:  Responds to questions appropriately with a normal affect.     Assessment and Plan  72 year old male with history of coronary artery disease status post coronary artery bypass grafting in 1996 with a saphenous vein graft to the RCA.  Cardiac catheterization in July 2018 revealed 100% RCA, 100% left circumflex, 70% proximal LAD with an occluded saphenous vein graft to the RCA.  He had ejection fraction 35-30% at that time.  Medical management versus redo CABG was discussed.  He was felt to be high risk for PCI of the LAD given the fact that the circumflex and RCA were occluded.  He was treated with aspirin Plavix and has done fairly well although now was admitted with further chest pain.  He has a mild troponin elevation.  He also has acute on chronic renal insufficiency with a bump in his creatinine to 2.66.  Echo showed no change in his LV function.  We will need to stop Plavix and consider relook heart catheterization to determine any interval change in his anatomy in preparation for consideration for redo bypass surgery.  Have discussed with cardiothoracic surgery.  He also has peripheral vascular disease.  He is status post intervention in the right SFA.  We will review with vascular surgery regarding access site for cardiac cath.  Most likely will be able to approach from right or left femoral site.  Will discontinue diuretic, will add topical nitrates and discontinue Plavix.  We will continue with aspirin and beta-blocker.  Careful hydration  following renal function.  We will proceed with left heart cath when creatinine returns to normal.  Further recommendations after anatomy is redefined.  Would continue with high intensity statin.  Signed, Teodoro Spray MD 05/25/2017, 1:14 PM Pager: [phone]

## 2017-05-25 NOTE — Progress Notes (Signed)
[place] [facility] [place] NAME: Markie Frith    MR#:  [id number]  DATE OF BIRTH:  03-30-1946  SUBJECTIVE:   Patient came in with on and off chest pain.  Currently on heparin drip.  He was found to have elevated creatinine from his baseline.  Family in the room.  Chest pain now resolved after IV morphine. REVIEW OF SYSTEMS:   Review of Systems  Constitutional: Negative for chills, fever and weight loss.  HENT: Negative for ear discharge, ear pain and nosebleeds.   Eyes: Negative for blurred vision, pain and discharge.  Respiratory: Negative for sputum production, shortness of breath, wheezing and stridor.   Cardiovascular: Negative for chest pain, palpitations, orthopnea and PND.  Gastrointestinal: Negative for abdominal pain, diarrhea, nausea and vomiting.  Genitourinary: Negative for frequency and urgency.  Musculoskeletal: Negative for back pain and joint pain.  Neurological: Negative for sensory change, speech change, focal weakness and weakness.  Psychiatric/Behavioral: Negative for depression and hallucinations. The patient is not nervous/anxious.    Tolerating Diet: Yes Tolerating PT: Not needed  DRUG ALLERGIES:   Allergies  Allergen Reactions  . Ciprofloxacin Itching and Swelling    Facial swelling     VITALS:  Blood pressure 133/76, pulse 84, temperature (!) 97.4 F (36.3 C), temperature source Oral, resp. rate 18, height 5\' 6"  (1.676 m), weight 75.3 kg (166 lb), SpO2 99 %.  PHYSICAL EXAMINATION:   Physical Exam  GENERAL:  72 y.o.-year-old patient lying in the bed with no acute distress.  EYES: Pupils equal, round, reactive to light and accommodation. No scleral icterus. Extraocular muscles intact.  HEENT: Head atraumatic, normocephalic. Oropharynx and nasopharynx clear.  NECK:  Supple, no jugular venous distention. No thyroid enlargement, no tenderness.  LUNGS: Normal breath sounds bilaterally, no wheezing, rales,  rhonchi. No use of accessory muscles of respiration.  CARDIOVASCULAR: S1, S2 normal. No murmurs, rubs, or gallops.  ABDOMEN: Soft, nontender, nondistended. Bowel sounds present. No organomegaly or mass.  EXTREMITIES: No cyanosis, clubbing or edema b/l.    NEUROLOGIC: Cranial nerves II through XII are intact. No focal Motor or sensory deficits b/l.   PSYCHIATRIC:  patient is alert and oriented x 3.  SKIN: No obvious rash, lesion, or ulcer.   LABORATORY PANEL:  CBC Recent Labs  Lab 05/25/17 0550  WBC 9.7  HGB 7.9*  HCT 23.8*  PLT 377    Chemistries  Recent Labs  Lab 05/25/17 0550  NA 137  K 4.7  CL 112*  CO2 16*  GLUCOSE 93  BUN 38*  CREATININE 2.62*  CALCIUM 8.3*   Cardiac Enzymes Recent Labs  Lab 05/25/17 1248  TROPONINI 0.55*   RADIOLOGY:  Dg Chest 2 View  Result Date: 05/24/2017 CLINICAL DATA:  Left chest pain. EXAM: CHEST  2 VIEW COMPARISON:  None. FINDINGS: Cardiomegaly. The hila and mediastinum are normal. No pulmonary nodules or masses. No focal infiltrates. No overt edema. IMPRESSION: No active cardiopulmonary disease. Electronically Signed   By: Dorise Bullion III M.D   On: 05/24/2017 21:55   ASSESSMENT AND PLAN:  Lexus Barletta  is a 72 y.o. male who presents with recurring and increasing episodes of chest pain.  Patient states that he had an episode earlier today which was the most severe that it has been.  It is central chest pain, pressure-like in nature, radiated to his shoulder blades  1.  Unstable angina/chest pain -with elevated troponin.   -heparin drip started.   -  Troponin 0 0.35--0.44--0.58--.55 -pt seen by Dr Vickii Penna recommendations  2. CAD (coronary artery disease) of artery bypass graft  -continue home meds, heparin drip as above, other workup as above  3.  DM (diabetes mellitus), type 2 with renal complications (HCC) -sliding scale insulin with corresponding glucose checks  4.  Essential (primary) hypertension -continue home meds     5.Acute on chronic renal failure (HCC) -avoid nephrotoxins and monitor -basleine creat 1.6-1.8 -creat 2.66 -getting IVF  6.  Hyperlipidemia -continue home meds   Case discussed with Care Management/Social Worker. Management plans discussed with the patient, family and they are in agreement.  CODE STATUS: FULL  DVT Prophylaxis: heparin  TOTAL TIME TAKING CARE OF THIS PATIENT: 40 minutes.  >50% time spent on counselling and coordination of care  POSSIBLE D/C IN *1-2* DAYS, DEPENDING ON CLINICAL CONDITION.  Note: This dictation was prepared with Dragon dictation along with smaller phrase technology. Any transcriptional errors that result from this process are unintentional.  Fritzi Mandes M.D on 05/25/2017 at 5:43 PM  Between 7am to 6pm - Pager - [phone]  After 6pm go to [email or web address] - password [email or web address] [facility]  Office  [phone]  CC: Primary care physician; Birdie Sons, MDPatient ID: [name], male   DOB: 01/24/1946, 72 y.o.   MRN: [id number]

## 2017-05-25 NOTE — Progress Notes (Addendum)
ANTICOAGULATION CONSULT NOTE - Initial Consult  Pharmacy Consult for heparin drip Indication: chest pain/ACS  Allergies  Allergen Reactions  . Ciprofloxacin Itching and Swelling    Facial swelling     Patient Measurements: Height: 5\' 6"  (167.6 cm) Weight: 166 lb (75.3 kg) IBW/kg (Calculated) : 63.8 Heparin Dosing Weight: 75kg  Vital Signs: Temp: 97.4 F (36.3 C) (02/07 0615) Temp Source: Oral (02/07 0615) BP: 133/76 (02/07 1440) Pulse Rate: 84 (02/07 1440)  Labs: Recent Labs    05/24/17 2126 05/24/17 2352 05/25/17 0550 05/25/17 0845 05/25/17 1248 05/25/17 1608  HGB 8.3*  --  7.9*  --   --   --   HCT 24.9*  --  23.8*  --   --   --   PLT 403  --  377  --   --   --   APTT 36  --   --   --   --   --   LABPROT 13.7  --   --   --   --   --   INR 1.06  --   --   --   --   --   HEPARINUNFRC  --   --   --  0.25*  --  0.38  CREATININE 2.66*  --  2.62*  --   --   --   TROPONINI 0.35* 0.44* 0.58*  --  0.55*  --     Estimated Creatinine Clearance: 22.7 mL/min (A) (by C-G formula based on SCr of 2.62 mg/dL (H)).   Medical History: Past Medical History:  Diagnosis Date  . Allergy   . Arthritis   . CAD (coronary artery disease)   . Diabetes mellitus without complication ([place])   . Erosive esophagitis   . GERD (gastroesophageal reflux disease)   . Gouty arthropathy   . History of colonic polyps   . History of kidney stones   . History of MRSA infection   . Hyperkalemia   . Hyperlipidemia   . Hypertension   . Melanoma ([place])   . Myocardial infarction ([place]) 1986  . Peripheral vascular disease ([place])   . 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 Trigger finger of left hand   . Ureteral tumor     Medications:  No anticoagulation in PTA meds.  Assessment: Trop 0.35  Goal of Therapy:  Heparin level 0.3-0.7 units/ml Monitor platelets by anticoagulation protocol: Yes   Plan:  Heparin level low at 0.25. Will bolus 1100 units and increase rate to  1050 units/hr  Melissa D Maccia, Pharm.D, BCPS Clinical Pharmacist  2/7@1700  HL 0.38, therapeutic, Will continue heparin IV 1050units/hr and recheck HL in 6 hours per protocol.   Thomasenia Sales, PharmD, MBA, [employer] Clinical Pharmacist [facility]     05/25/2017,4:48 PM

## 2017-05-25 NOTE — Plan of Care (Signed)
  Progressing Clinical Measurements: Ability to maintain clinical measurements within normal limits will improve 05/25/2017 0137 - Progressing by Liliane Channel, RN Cardiovascular complication will be avoided 05/25/2017 0137 - Progressing by Liliane Channel, RN Pain Managment: General experience of comfort will improve 05/25/2017 0137 - Progressing by Liliane Channel, RN Safety: Ability to remain free from injury will improve 05/25/2017 0137 - Progressing by Liliane Channel, RN

## 2017-05-25 NOTE — Care Management Obs Status (Signed)
[place] NOTIFICATION   Patient Details  Name: Thomas [name] MRN: [id number] Date of Birth: [birth year]   Medicare Observation Status Notification Given:  Yes    Katrina Stack, RN 05/25/2017, 1:47 PM

## 2017-05-25 NOTE — Care Management (Addendum)
Placed in observation for chest pain. Troponins have steadily increased: .35, .44, .58. On heparin drip.  Cardiology consult pending

## 2017-05-25 NOTE — Progress Notes (Signed)
ANTICOAGULATION CONSULT NOTE - Initial Consult  Pharmacy Consult for heparin drip Indication: chest pain/ACS  Allergies  Allergen Reactions  . Ciprofloxacin Itching and Swelling    Facial swelling     Patient Measurements: Height: 5\' 6"  (167.6 cm) Weight: 166 lb (75.3 kg) IBW/kg (Calculated) : 63.8 Heparin Dosing Weight: 75kg  Vital Signs: Temp: 97.4 F (36.3 C) (02/07 0615) Temp Source: Oral (02/07 0615) BP: 144/84 (02/07 0739) Pulse Rate: 86 (02/07 0739)  Labs: Recent Labs    05/24/17 2126 05/24/17 2352 05/25/17 0550 05/25/17 0845  HGB 8.3*  --  7.9*  --   HCT 24.9*  --  23.8*  --   PLT 403  --  377  --   APTT 36  --   --   --   LABPROT 13.7  --   --   --   INR 1.06  --   --   --   HEPARINUNFRC  --   --   --  0.25*  CREATININE 2.66*  --  2.62*  --   TROPONINI 0.35* 0.44* 0.58*  --     Estimated Creatinine Clearance: 22.7 mL/min (A) (by C-G formula based on SCr of 2.62 mg/dL (H)).   Medical History: Past Medical History:  Diagnosis Date  . Allergy   . Arthritis   . CAD (coronary artery disease)   . Diabetes mellitus without complication ([place])   . Erosive esophagitis   . GERD (gastroesophageal reflux disease)   . Gouty arthropathy   . History of colonic polyps   . History of kidney stones   . History of MRSA infection   . Hyperkalemia   . Hyperlipidemia   . Hypertension   . Melanoma ([place])   . Myocardial infarction ([place]) 1986  . Peripheral vascular disease ([place])   . 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 Trigger finger of left hand   . Ureteral tumor     Medications:  No anticoagulation in PTA meds.  Assessment: Trop 0.35  Goal of Therapy:  Heparin level 0.3-0.7 units/ml Monitor platelets by anticoagulation protocol: Yes   Plan:  Heparin level low at 0.25. Will bolus 1100 units and increase rate to 1050 units/hr  Dannika Hilgeman D Brixon Zhen, Pharm.D, BCPS Clinical Pharmacist  05/25/2017,9:42 AM

## 2017-05-25 NOTE — Progress Notes (Signed)
Ed nurse started the 4000 units bolus for the pt. Pt just arrived on the floor. Heparin drip at 9 ml/hr (continous) just started. Will continue to monitor.

## 2017-05-25 NOTE — ED Notes (Signed)
Heparin arrived from pharmacy, called [facility] and notified it will be sent by tube system to them, RN verbalized understanding of this.

## 2017-05-25 NOTE — Progress Notes (Signed)
Pt complains of having SOB. Applied 2 liters of oxygen and pt states he feeling a little bit better now. Also complains of back of the shoulder blade 1 out of 10. Also notify the prime about an episode of bleeding ulcer two years ago. Page prime. Awaiting call. Will continue to monitor.

## 2017-05-25 NOTE — Progress Notes (Signed)
*  PRELIMINARY RESULTS* Echocardiogram 2D Echocardiogram has been performed.  Thomas [name] 05/25/2017, 11:43 AM

## 2017-05-25 NOTE — Progress Notes (Signed)
Pt complaints of not being able to sleep. Page doctor Jannifer Franklin and he ordered Rozerem 8 mg daily at bedtime. Will continue to monitor.

## 2017-05-25 NOTE — Progress Notes (Addendum)
Pt still complaining of chest pain 3 out 10 radiating to left arm after receiving Tylenol at 710 a.m. No changes on EKG, VSS.Dr. Posey Pronto notified and orders received for Morphine. Awaiting Cardiology to see pt. Will Continue to monitor.

## 2017-05-26 LAB — BASIC METABOLIC PANEL
ANION GAP: 9 (ref 5–15)
BUN: 44 mg/dL — ABNORMAL HIGH (ref 6–20)
CO2: 17 mmol/L — AB (ref 22–32)
Calcium: 8.5 mg/dL — ABNORMAL LOW (ref 8.9–10.3)
Chloride: 111 mmol/L (ref 101–111)
Creatinine, Ser: 2.77 mg/dL — ABNORMAL HIGH (ref 0.61–1.24)
GFR calc Af Amer: 25 mL/min — ABNORMAL LOW (ref >60)
GFR calc non Af Amer: 21 mL/min — ABNORMAL LOW (ref >60)
GLUCOSE: 132 mg/dL — AB (ref 65–99)
POTASSIUM: 5.4 mmol/L — AB (ref 3.5–5.1)
Sodium: 137 mmol/L (ref 135–145)

## 2017-05-26 LAB — GLUCOSE, CAPILLARY
GLUCOSE-CAPILLARY: 155 mg/dL — AB (ref 65–99)
Glucose-Capillary: 129 mg/dL — ABNORMAL HIGH (ref 65–99)
Glucose-Capillary: 137 mg/dL — ABNORMAL HIGH (ref 65–99)
Glucose-Capillary: 170 mg/dL — ABNORMAL HIGH (ref 65–99)

## 2017-05-26 LAB — CBC
HEMATOCRIT: 22.5 % — AB (ref 40.0–52.0)
Hemoglobin: 7.4 g/dL — ABNORMAL LOW (ref 13.0–18.0)
MCH: 28.6 pg (ref 26.0–34.0)
MCHC: 32.7 g/dL (ref 32.0–36.0)
MCV: 87.3 fL (ref 80.0–100.0)
Platelets: 375 10*3/uL (ref 150–440)
RBC: 2.58 MIL/uL — AB (ref 4.40–5.90)
RDW: 14.6 % — ABNORMAL HIGH (ref 11.5–14.5)
WBC: 11 10*3/uL — AB (ref 3.8–10.6)

## 2017-05-26 LAB — HEPARIN LEVEL (UNFRACTIONATED)
Heparin Unfractionated: 0.45 IU/mL (ref 0.30–0.70)
Heparin Unfractionated: 0.49 IU/mL (ref 0.30–0.70)

## 2017-05-26 LAB — PREPARE RBC (CROSSMATCH)

## 2017-05-26 LAB — ABO/RH: ABO/RH(D): A NEG

## 2017-05-26 MED ORDER — ISOSORBIDE MONONITRATE ER 60 MG PO TB24
60.0000 mg | ORAL_TABLET | Freq: Every day | ORAL | Status: DC
  Administered 2017-05-26 – 2017-05-30 (×5): 60 mg via ORAL
  Filled 2017-05-26 (×5): qty 1

## 2017-05-26 MED ORDER — SODIUM CHLORIDE 0.9 % IV SOLN: INTRAVENOUS | Status: AC

## 2017-05-26 MED ORDER — SODIUM CHLORIDE 0.9 % IV SOLN
Freq: Once | INTRAVENOUS | Status: AC
  Administered 2017-05-26: 17:00:00 via INTRAVENOUS

## 2017-05-26 NOTE — Consult Note (Signed)
Date: 05/26/2017                  Patient Name:  Thomas [name]  MRN: [id number]  DOB: Aug 16, 1945  Age / Sex: 72 y.o., male         PCP: Birdie Sons, MD                 Service Requesting Consult: IM/ Fritzi Mandes, MD                 Reason for Consult: CKD            History of Present Illness: Patient is a 72 y.o. male with medical problems of severe coronary disease and CABG 1996, diabetes, severe atherosclerosis, who was admitted to [facility] on 05/24/2017 for evaluation of chest pain with radiation to left shoulder and back.  He is diagnosed with non-STEMI and is being evaluated for a cardiac catheterization and consideration for redo coronary artery bypass grafting.  Patient has chronic kidney disease nephrology consult has been requested for evaluation pre-IV contrast exposure.  His baseline creatinine appears to be 1.8/GFR 37 from June 2018 Admission creatinine is 2.66/GFR 22.  Today's creatinine is 2.77, GFR 21 Patient is overall doing well.  He denies any acute chest pain at present.  Able to eat without nausea or vomiting.   Medications: Outpatient medications: Medications Prior to Admission  Medication Sig Dispense Refill Last Dose  . acetaminophen (TYLENOL) 500 MG tablet Take 500 mg by mouth every 8 (eight) hours as needed for mild pain or headache.   prn at prn  . aspirin EC 81 MG tablet Take 81 mg by mouth daily.   05/24/2017 at 0900  . atorvastatin (LIPITOR) 80 MG tablet TAKE ONE TABLET BY MOUTH EVERY DAY 30 tablet 5 05/23/2017 at 2100  . clopidogrel (PLAVIX) 75 MG tablet Take 1 tablet (75 mg total) by mouth daily. 30 tablet 11 05/24/2017 at 0900  . Cyanocobalamin (VITAMIN B-12) 5000 MCG SUBL Place 5,000 mcg under the tongue daily.    05/24/2017 at Unknown time  . glipiZIDE (GLUCOTROL XL) 10 MG 24 hr tablet TAKE ONE (1) TABLET BY MOUTH EVERY DAY WITH BREAKFAST 30 tablet 12 05/24/2017 at 0900  . lisinopril-hydrochlorothiazide (PRINZIDE,ZESTORETIC) 20-25 MG tablet Take 1 tablet by  mouth daily. 30 tablet 5 05/24/2017 at 0900  . metFORMIN (GLUCOPHAGE-XR) 500 MG 24 hr tablet TAKE TWO TABLETS BY MOUTH EVERY DAY 60 tablet 12 05/24/2017 at 0900  . metoprolol succinate (TOPROL-XL) 25 MG 24 hr tablet Take 1 tablet (25 mg total) by mouth daily. 30 tablet 5 05/24/2017 at 0900  . pantoprazole (PROTONIX) 40 MG tablet Take 1 tablet (40 mg total) by mouth daily. 30 tablet 12 05/24/2017 at 0900  . buPROPion (WELLBUTRIN SR) 150 MG 12 hr tablet 1 tablet daily for 3 days, then 1 tablet twice daily. Stop smoking 14 days after starting medication (Patient not taking: Reported on 10/14/2016) 60 tablet 5 Not Taking at Unknown time  . clotrimazole-betamethasone (LOTRISONE) cream Apply 1 application topically 2 (two) times daily.   Not Taking at Unknown time  . colchicine 0.6 MG tablet Take 1 tablet (0.6 mg total) by mouth daily. As needed for gout (Patient not taking: Reported on 05/24/2017) 30 tablet 5 Not Taking at Unknown time  . glucose blood test strip    Taking  . terbinafine (LAMISIL) 250 MG tablet Take 250 mg by mouth daily.   Completed Course at Unknown time  .  triamcinolone cream (KENALOG) 0.1 %    Completed Course at Unknown time    Current medications: Current Facility-Administered Medications  Medication Dose Route Frequency Provider Last Rate Last Dose  . 0.9 %  sodium chloride infusion   Intravenous Continuous Teodoro Spray, MD      . 0.9 %  sodium chloride infusion   Intravenous Once Fritzi Mandes, MD      . acetaminophen (TYLENOL) tablet 650 mg  650 mg Oral Q6H PRN Lance Coon, MD   650 mg at 05/25/17 1442   Or  . acetaminophen (TYLENOL) suppository 650 mg  650 mg Rectal Q6H PRN Lance Coon, MD      . aspirin EC tablet 81 mg  81 mg Oral Daily Lance Coon, MD   81 mg at 05/26/17 0815  . atorvastatin (LIPITOR) tablet 80 mg  80 mg Oral Daily Lance Coon, MD   80 mg at 05/26/17 0815  . HYDROcodone-acetaminophen (NORCO/VICODIN) 5-325 MG per tablet 1 tablet  1 tablet Oral Q4H PRN  Lance Coon, MD   1 tablet at 05/26/17 0504  . insulin aspart (novoLOG) injection 0-5 Units  0-5 Units Subcutaneous QHS Lance Coon, MD      . insulin aspart (novoLOG) injection 0-9 Units  0-9 Units Subcutaneous TID WC Lance Coon, MD   2 Units at 05/26/17 1129  . isosorbide mononitrate (IMDUR) 24 hr tablet 60 mg  60 mg Oral Daily Teodoro Spray, MD   60 mg at 05/26/17 0815  . metoprolol succinate (TOPROL-XL) 24 hr tablet 25 mg  25 mg Oral Daily Lance Coon, MD   25 mg at 05/26/17 1696  . ondansetron (ZOFRAN) tablet 4 mg  4 mg Oral Q6H PRN Lance Coon, MD       Or  . ondansetron [facility]) injection 4 mg  4 mg Intravenous Q6H PRN Lance Coon, MD      . pantoprazole (PROTONIX) EC tablet 40 mg  40 mg Oral Daily Lance Coon, MD   40 mg at 05/26/17 7893  . ramelteon (ROZEREM) tablet 8 mg  8 mg Oral Corwin Levins, MD   8 mg at 05/25/17 2341      Allergies: Allergies  Allergen Reactions  . Ciprofloxacin Itching and Swelling    Facial swelling       Past Medical History: Past Medical History:  Diagnosis Date  . Allergy   . Arthritis   . CAD (coronary artery disease)   . Diabetes mellitus without complication ([place])   . Erosive esophagitis   . GERD (gastroesophageal reflux disease)   . Gouty arthropathy   . History of colonic polyps   . History of kidney stones   . History of MRSA infection   . Hyperkalemia   . Hyperlipidemia   . Hypertension   . Melanoma ([place])   . Myocardial infarction ([place]) 1986  . Peripheral vascular disease ([place])   . 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 Trigger finger of left hand   . Ureteral tumor      Past Surgical History: Past Surgical History:  Procedure Laterality Date  . CARDIAC CATHETERIZATION    . CATARACT EXTRACTION    . CORONARY ARTERY BYPASS GRAFT  1996  . EXCISION Ureteral Tumor    . EYE SURGERY Bilateral    Cataract Extraction with IOL  . LEFT HEART CATH AND CORONARY ANGIOGRAPHY N/A 10/18/2016    Procedure: Left Heart Cath and Coronary Angiography;  Surgeon: Isaias Cowman,  MD;  Location: [place] [facility];  Service: Cardiovascular;  Laterality: N/A;  . LOWER EXTREMITY ANGIOGRAPHY Right 10/31/2016   Procedure: Lower Extremity Angiography;  Surgeon: Algernon Huxley, MD;  Location: [place] [facility];  Service: Cardiovascular;  Laterality: Right;  . LOWER EXTREMITY ANGIOGRAPHY Left 11/14/2016   Procedure: Lower Extremity Angiography;  Surgeon: Algernon Huxley, MD;  Location: [place] [facility];  Service: Cardiovascular;  Laterality: Left;  . LOWER EXTREMITY INTERVENTION  11/14/2016   Procedure: Lower Extremity Intervention;  Surgeon: Algernon Huxley, MD;  Location: [place] [facility];  Service: Cardiovascular;;  . RETINAL DETACHMENT SURGERY Right November 2107     Family History: Family History  Problem Relation Age of Onset  . Alzheimer's disease Mother   . Alzheimer's disease Brother   . Lung cancer Paternal Grandfather      Social History: Social History   Socioeconomic History  . Marital status: Married    Spouse name: Not on file  . Number of children: 1  . Years of education: Not on file  . Highest education level: Not on file  Social Needs  . Financial resource strain: Not on file  . Food insecurity - worry: Not on file  . Food insecurity - inability: Not on file  . Transportation needs - medical: Not on file  . Transportation needs - non-medical: Not on file  Occupational History  . Occupation: Retired/Part-Time    Comment: [employer]  Tobacco Use  . Smoking status: Current Every Day Smoker    Packs/day: 1.00    Years: 51.00    Pack years: 51.00    Types: Cigarettes  . Smokeless tobacco: Never Used  Substance and Sexual Activity  . Alcohol use: Yes    Alcohol/week: 0.0 oz    Comment:  2 beers a month  . Drug use: No  . Sexual activity: Not on file  Other Topics Concern  . Not on file  Social History Narrative  . Not on file      Review of Systems: Gen: Denies fevers or chills HEENT: Denies vision or hearing problems CV: History of CABG, non-STEMI Resp: Denies any shortness of breath or sputum production GI: Appetite is fair, no blood in the stool GU : History of urinary procedure/stent for left ureteral blockage over 20 years ago MS: Denies any acute complaints Derm:  No complaints Psych: No complaints Heme: No complaints Neuro: No complaints Endocrine.  No complaints  Vital Signs: Blood pressure 138/77, pulse 81, temperature (!) 97.5 F (36.4 C), temperature source Oral, resp. rate 18, height 5\' 6"  (1.676 m), weight 75.3 kg (166 lb), SpO2 100 %.   Intake/Output Summary (Last 24 hours) at 05/26/2017 1633 Last data filed at 05/26/2017 1355 Gross per 24 hour  Intake 902.5 ml  Output 520 ml  Net 382.5 ml    Weight trends: [employer]   05/24/17 2120  Weight: 75.3 kg (166 lb)    Physical Exam: General:  Elderly gentleman, laying in the bed  HEENT  anicteric, moist oral mucous membranes  Neck:  Supple  Lungs:  Normal breathing effort, basilar crackles bilaterally  Heart::  Soft systolic murmur, no rub  Abdomen:  Soft, nontender, nondistended  Extremities:  No peripheral edema  Neurologic:  Alert, oriented  Skin:  No acute rashes             Lab results: Basic Metabolic Panel: Recent Labs  Lab 05/24/17 2126 05/25/17 0550 05/26/17 0628  NA 137  137 137  K 4.8 4.7 5.4*  CL 109 112* 111  CO2 17* 16* 17*  GLUCOSE 242* 93 132*  BUN 38* 38* 44*  CREATININE 2.66* 2.62* 2.77*  CALCIUM 8.5* 8.3* 8.5*    Liver Function Tests: No results for input(s): AST, ALT, ALKPHOS, BILITOT, PROT, ALBUMIN in the last 168 hours. No results for input(s): LIPASE, AMYLASE in the last 168 hours. No results for input(s): AMMONIA in the last 168 hours.  CBC: Recent Labs  Lab 05/25/17 0550 05/26/17 0403  WBC 9.7 11.0*  HGB 7.9* 7.4*  HCT 23.8* 22.5*  MCV 87.6 87.3  PLT 377 375    Cardiac  Enzymes: Recent Labs  Lab 05/25/17 1248  TROPONINI 0.55*    BNP: Invalid input(s): POCBNP  CBG: Recent Labs  Lab 05/25/17 1124 05/25/17 1701 05/25/17 2098/07/11 05/26/17 0751 05/26/17 1121  GLUCAP 198* 135* 183* 129* 155*    Microbiology: No results found for this or any previous visit (from the past 720 hour(s)).   Coagulation Studies: Recent Labs    05/24/17 2124/07/11  LABPROT 13.7  INR 1.06    Urinalysis: No results for input(s): COLORURINE, LABSPEC, PHURINE, GLUCOSEU, HGBUR, BILIRUBINUR, KETONESUR, PROTEINUR, UROBILINOGEN, NITRITE, LEUKOCYTESUR in the last 72 hours.  Invalid input(s): APPERANCEUR      Imaging: Dg Chest 2 View  Result Date: 05/24/2017 CLINICAL DATA:  Left chest pain. EXAM: CHEST  2 VIEW COMPARISON:  None. FINDINGS: Cardiomegaly. The hila and mediastinum are normal. No pulmonary nodules or masses. No focal infiltrates. No overt edema. IMPRESSION: No active cardiopulmonary disease. Electronically Signed   By: Dorise Bullion III M.D   On: 05/24/2017 21:55      Assessment & Plan: Pt is a 72 y.o. Caucasian  male with diabetes, hypertension, severe coronary disease, CABG 1994/07/12, peripheral arterial disease, was admitted on 05/24/2017 with non-STEMI.   1.  Chronic kidney disease stage IV 2.  Diabetes type 2 with chronic kidney disease 3.  Current smoker  Patient appears to have severe underlying chronic kidney disease.  His creatinine from 2 years ago is 2.07, GFR 30 but at present, his creatinine is higher at 2.66.  This may represent progression of underlying chronic kidney disease A cardiac catheterization is being contemplated for evaluation for a redo of CABG Risks and benefits of IV contrast exposure were discussed with patient and his family who were present in the room including acute kidney injury as well as occasionally progression to dialysis.  Patient understands the risks Pre-cath, patient should be given IV hydration Given his most recent LVEF of  35-40%, gentle IV hydration is recommended with normal saline 100 cc an hour 2-3 hours prior to procedure and 4-6 hours postprocedure Discussed with patient to quit smoking.  He said he will consider it. Patient was taking lisinopril at home but is not on ACE inhibitor at present due to advanced renal failure and consideration of cardiac cath. He is also taking metformin as outpatient which is not recommended in the setting of low GFR.  Will follow

## 2017-05-26 NOTE — Progress Notes (Signed)
[place] [facility] [place] NAME: Thomas [name]    MR#:  [id number]  DATE OF BIRTH:  07-31-1945  SUBJECTIVE:   Patient came in with on and off chest pain.  Family in the room.  Chest pain now resolved after IV morphine.  REVIEW OF SYSTEMS:   Review of Systems  Constitutional: Negative for chills, fever and weight loss.  HENT: Negative for ear discharge, ear pain and nosebleeds.   Eyes: Negative for blurred vision, pain and discharge.  Respiratory: Negative for sputum production, shortness of breath, wheezing and stridor.   Cardiovascular: Negative for chest pain, palpitations, orthopnea and PND.  Gastrointestinal: Negative for abdominal pain, diarrhea, nausea and vomiting.  Genitourinary: Negative for frequency and urgency.  Musculoskeletal: Negative for back pain and joint pain.  Neurological: Negative for sensory change, speech change, focal weakness and weakness.  Psychiatric/Behavioral: Negative for depression and hallucinations. The patient is not nervous/anxious.    Tolerating Diet: Yes Tolerating PT: Not needed  DRUG ALLERGIES:   Allergies  Allergen Reactions  . Ciprofloxacin Itching and Swelling    Facial swelling     VITALS:  Blood pressure 127/62, pulse 76, temperature 97.9 F (36.6 C), temperature source Oral, resp. rate 18, height 5\' 6"  (1.676 m), weight 75.3 kg (166 lb), SpO2 99 %.  PHYSICAL EXAMINATION:   Physical Exam  GENERAL:  72 y.o.-year-old patient lying in the bed with no acute distress.  EYES: Pupils equal, round, reactive to light and accommodation. No scleral icterus. Extraocular muscles intact.  HEENT: Head atraumatic, normocephalic. Oropharynx and nasopharynx clear.  NECK:  Supple, no jugular venous distention. No thyroid enlargement, no tenderness.  LUNGS: Normal breath sounds bilaterally, no wheezing, rales, rhonchi. No use of accessory muscles of respiration.  CARDIOVASCULAR: S1, S2 normal. No  murmurs, rubs, or gallops.  ABDOMEN: Soft, nontender, nondistended. Bowel sounds present. No organomegaly or mass.  EXTREMITIES: No cyanosis, clubbing or edema b/l.    NEUROLOGIC: Cranial nerves II through XII are intact. No focal Motor or sensory deficits b/l.   PSYCHIATRIC:  patient is alert and oriented x 3.  SKIN: No obvious rash, lesion, or ulcer.   LABORATORY PANEL:  CBC Recent Labs  Lab 05/26/17 0403  WBC 11.0*  HGB 7.4*  HCT 22.5*  PLT 375    Chemistries  Recent Labs  Lab 05/26/17 0628  NA 137  K 5.4*  CL 111  CO2 17*  GLUCOSE 132*  BUN 44*  CREATININE 2.77*  CALCIUM 8.5*   Cardiac Enzymes Recent Labs  Lab 05/25/17 1248  TROPONINI 0.55*   RADIOLOGY:  Dg Chest 2 View  Result Date: 05/24/2017 CLINICAL DATA:  Left chest pain. EXAM: CHEST  2 VIEW COMPARISON:  None. FINDINGS: Cardiomegaly. The hila and mediastinum are normal. No pulmonary nodules or masses. No focal infiltrates. No overt edema. IMPRESSION: No active cardiopulmonary disease. Electronically Signed   By: Dorise Bullion III M.D   On: 05/24/2017 21:55   ASSESSMENT AND PLAN:  Thomas [name]  is a 72 y.o. male who presents with recurring and increasing episodes of chest pain.  Patient states that he had an episode earlier today which was the most severe that it has been.  It is central chest pain, pressure-like in nature, radiated to his shoulder blades  1.  Unstable angina/chest pain -with elevated troponin.   -heparin drip started--now on HOLD due to low hgb. No active  bleeding - Troponin  0.35--0.44--0.58--.55 -pt seen by Dr Vickii Penna  recommendations --- possible Cath on monday  2. CAD (coronary artery disease) of artery bypass graft  -continue home meds  3.  DM (diabetes mellitus), type 2 with renal complications (HCC -sliding scale insulin   4.  Essential (primary) hypertension -continue home meds    5.Acute on chronic renal failure (HCC) -avoid nephrotoxins and monitor -basleine creat  1.6-1.8 -creat 2.66--2.77 -getting IVF at low rate  6.  Hyperlipidemia -continue home meds   Case discussed with Care Management/Social Worker. Management plans discussed with the patient, family and they are in agreement.  CODE STATUS: FULL  DVT Prophylaxis: heparin  TOTAL TIME TAKING CARE OF THIS PATIENT: 40 minutes.  >50% time spent on counselling and coordination of care  POSSIBLE D/C IN *1-2* DAYS, DEPENDING ON CLINICAL CONDITION.  Note: This dictation was prepared with Dragon dictation along with smaller phrase technology. Any transcriptional errors that result from this process are unintentional.  Fritzi Mandes M.D on 05/26/2017 at 5:49 PM  Between 7am to 6pm - Pager - [phone]  After 6pm go to [email or web address] - password [email or web address] [facility]  Office  [phone]  CC: Primary care physician; Birdie Sons, MDPatient ID: Thomas [name], male   DOB: 27-Feb-1946, 72 y.o.   MRN: [id number]

## 2017-05-26 NOTE — Care Management Important Message (Signed)
Important Message  Patient Details  Name: Thomas [name] MRN: [id number] Date of Birth: Sep 10, 1945   Medicare Important Message Given:  Yes Signed IM notice given    Katrina Stack, RN 05/26/2017, 5:22 PM

## 2017-05-26 NOTE — Plan of Care (Signed)
  Progressing Education: Knowledge of General Education information will improve 05/26/2017 0032 - Progressing by Liliane Channel, RN Clinical Measurements: Ability to maintain clinical measurements within normal limits will improve 05/26/2017 0032 - Progressing by Liliane Channel, RN Cardiovascular complication will be avoided 05/26/2017 0032 - Progressing by Liliane Channel, RN Coping: Level of anxiety will decrease 05/26/2017 0032 - Progressing by Liliane Channel, RN Pain Managment: General experience of comfort will improve 05/26/2017 0032 - Progressing by Liliane Channel, RN Safety: Ability to remain free from injury will improve 05/26/2017 0032 - Progressing by Liliane Channel, RN

## 2017-05-26 NOTE — Progress Notes (Addendum)
ANTICOAGULATION CONSULT NOTE - Initial Consult  Pharmacy Consult for heparin drip Indication: chest pain/ACS  Allergies  Allergen Reactions  . Ciprofloxacin Itching and Swelling    Facial swelling     Patient Measurements: Height: 5\' 6"  (167.6 cm) Weight: 166 lb (75.3 kg) IBW/kg (Calculated) : 63.8 Heparin Dosing Weight: 75kg  Vital Signs: Temp: 98.1 F (36.7 C) (02/07 2054) Temp Source: Oral (02/07 2054) BP: 131/78 (02/07 2354) Pulse Rate: 84 (02/07 2354)  Labs: Recent Labs    05/24/17 2126 05/24/17 2352 05/25/17 0550 05/25/17 0845 05/25/17 1248 05/25/17 1608 05/25/17 2355  HGB 8.3*  --  7.9*  --   --   --   --   HCT 24.9*  --  23.8*  --   --   --   --   PLT 403  --  377  --   --   --   --   APTT 36  --   --   --   --   --   --   LABPROT 13.7  --   --   --   --   --   --   INR 1.06  --   --   --   --   --   --   HEPARINUNFRC  --   --   --  0.25*  --  0.38 0.45  CREATININE 2.66*  --  2.62*  --   --   --   --   TROPONINI 0.35* 0.44* 0.58*  --  0.55*  --   --     Estimated Creatinine Clearance: 22.7 mL/min (A) (by C-G formula based on SCr of 2.62 mg/dL (H)).   Medical History: Past Medical History:  Diagnosis Date  . Allergy   . Arthritis   . CAD (coronary artery disease)   . Diabetes mellitus without complication ([place])   . Erosive esophagitis   . GERD (gastroesophageal reflux disease)   . Gouty arthropathy   . History of colonic polyps   . History of kidney stones   . History of MRSA infection   . Hyperkalemia   . Hyperlipidemia   . Hypertension   . Melanoma ([place])   . Myocardial infarction ([place]) 1986  . Peripheral vascular disease ([place])   . 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 Trigger finger of left hand   . Ureteral tumor     Medications:  No anticoagulation in PTA meds.  Assessment: Trop 0.35  Goal of Therapy:  Heparin level 0.3-0.7 units/ml Monitor platelets by anticoagulation protocol: Yes   Plan:  Heparin  level low at 0.25. Will bolus 1100 units and increase rate to 1050 units/hr  Melissa D Maccia, Pharm.D, BCPS Clinical Pharmacist  2/7@1700  HL 0.38, therapeutic, Will continue heparin IV 1050units/hr and recheck HL in 6 hours per protocol.    02/07 2355 heparin level 0.45. Continue current regimen. Recheck heparin level and CBC with tomorrow AM labs.  02/08 AM heparin level 0.49. Continue current regimen. Recheck heparin level and CBC with tomorrow AM labs.  Sim Boast, PharmD, BCPS  05/26/17 2:17 AM

## 2017-05-26 NOTE — Plan of Care (Signed)
  Progressing Education: Knowledge of General Education information will improve 05/26/2017 1029 - Progressing by Rolley Sims, RN Health Behavior/Discharge Planning: Ability to manage health-related needs will improve 05/26/2017 1029 - Progressing by Rolley Sims, RN Clinical Measurements: Ability to maintain clinical measurements within normal limits will improve 05/26/2017 1029 - Progressing by Rolley Sims, RN Safety: Ability to remain free from injury will improve 05/26/2017 1029 - Progressing by Rolley Sims, RN

## 2017-05-26 NOTE — Progress Notes (Signed)
Patient Name: Thomas [name] Date of Encounter: 05/26/2017  Hospital Problem List     Principal Problem:   Chest pain Active Problems:   DM (diabetes mellitus), type 2 with renal complications (HCC)   Essential (primary) hypertension   Hyperlipidemia   CAD (coronary artery disease) of artery bypass graft   Acute on chronic renal failure (HCC)   NSTEMI (non-ST elevated myocardial infarction) [facility])    Patient Profile     72 year old male with three-vessel coronary disease now admitted with non-ST infarction with a mild elevation in his serum troponin.  Has failed medical therapy and is being considered for relook cardiac catheterization and consideration for redo coronary artery bypass grafting.  Chest pain has improved however has nausea and vomiting Subjective   Nausea and vomiting.  Reduced chest pain.  Inpatient Medications    . aspirin EC  81 mg Oral Daily  . atorvastatin  80 mg Oral Daily  . insulin aspart  0-5 Units Subcutaneous QHS  . insulin aspart  0-9 Units Subcutaneous TID WC  . isosorbide mononitrate  60 mg Oral Daily  . metoprolol succinate  25 mg Oral Daily  . pantoprazole  40 mg Oral Daily  . ramelteon  8 mg Oral QHS    Vital Signs    Vitals:   05/25/17 2054 05/25/17 2354 05/26/17 0504 05/26/17 0750  BP: 131/74 131/78 (!) 151/85 138/77  Pulse: 85 84 80 81  Resp: 18  18   Temp: 98.1 F (36.7 C)  97.8 F (36.6 C) (!) 97.5 F (36.4 C)  TempSrc: Oral   Oral  SpO2: 97% 98% 98% 100%  Weight:      Height:        Intake/Output Summary (Last 24 hours) at 05/26/2017 0843 Last data filed at 05/26/2017 0052 Gross per 24 hour  Intake 340.45 ml  Output 420 ml  Net -79.55 ml   Filed Weights   05/24/17 2120  Weight: 75.3 kg (166 lb)    Physical Exam    GEN: Well nourished, well developed, in no acute distress.  HEENT: normal.  Neck: Supple, no JVD, carotid bruits, or masses. Cardiac: RRR, no murmurs, rubs, or gallops. No clubbing, cyanosis, edema.   Radials/DP/PT 2+ and equal bilaterally.  Respiratory:  Respirations regular and unlabored, clear to auscultation bilaterally. GI: Soft, nontender, nondistended, BS + x 4. MS: no deformity or atrophy. Skin: warm and dry, no rash. Neuro:  Strength and sensation are intact. Psych: Normal affect.  Labs    CBC Recent Labs    05/25/17 0550 05/26/17 0403  WBC 9.7 11.0*  HGB 7.9* 7.4*  HCT 23.8* 22.5*  MCV 87.6 87.3  PLT 377 093   Basic Metabolic Panel Recent Labs    05/25/17 0550 05/26/17 0628  NA 137 137  K 4.7 5.4*  CL 112* 111  CO2 16* 17*  GLUCOSE 93 132*  BUN 38* 44*  CREATININE 2.62* 2.77*  CALCIUM 8.3* 8.5*   Liver Function Tests No results for input(s): AST, ALT, ALKPHOS, BILITOT, PROT, ALBUMIN in the last 72 hours. No results for input(s): LIPASE, AMYLASE in the last 72 hours. Cardiac Enzymes Recent Labs    05/24/17 2352 05/25/17 0550 05/25/17 1248  TROPONINI 0.44* 0.58* 0.55*   BNP No results for input(s): BNP in the last 72 hours. D-Dimer No results for input(s): DDIMER in the last 72 hours. Hemoglobin A1C No results for input(s): HGBA1C in the last 72 hours. Fasting Lipid Panel No results for input(s):  CHOL, HDL, LDLCALC, TRIG, CHOLHDL, LDLDIRECT in the last 72 hours. Thyroid Function Tests No results for input(s): TSH, T4TOTAL, T3FREE, THYROIDAB in the last 72 hours.  Invalid input(s): FREET3  Telemetry    Normal sinus rhythm  ECG    Normal sinus rhythm with left posterior fascicular block and right bundle branch block.  Occasional PVCs.  Radiology    Dg Chest 2 View  Result Date: 05/24/2017 CLINICAL DATA:  Left chest pain. EXAM: CHEST  2 VIEW COMPARISON:  None. FINDINGS: Cardiomegaly. The hila and mediastinum are normal. No pulmonary nodules or masses. No focal infiltrates. No overt edema. IMPRESSION: No active cardiopulmonary disease. Electronically Signed   By: Dorise Bullion III M.D   On: 05/24/2017 21:55    Assessment & Plan     The patient is a 72 year old male with history of occluded RCA with occluded saphenous vein graft to the RCA placed in 1996, occluded left circumflex, 70% LAD by cardiac catheterization in July 2018.  Now admitted with non-ST elevation myocardial infarction.  Is hemodynamically stable and pain has improved.  Will need relook cardiac catheterization prior to consideration for redo coronary artery bypass grafting.  Renal function has precluded cardiac cath up until now.  He is serum creatinine is increased from yesterday to 2.77.  We will need to defer left heart cath and gently hydrate.  Discussed this at length with the patient and multiple family members.  We will plan to proceed with left heart cath on May 29, 2017.  Will gently hydrate, continue with long-acting nitrates, aspirin, beta-blocker, IV heparin and high intensity statin.  Further recommendations after cardiac cath is complete.  Signed, Javier Docker Chuong [place]be[place] MD 05/26/2017, 8:43 AM  Pager: [phone]

## 2017-05-27 ENCOUNTER — Inpatient Hospital Stay: Payer: Medicare Other

## 2017-05-27 LAB — PROTEIN / CREATININE RATIO, URINE
Creatinine, Urine: 136 mg/dL
Protein Creatinine Ratio: 5.25 mg/mg{Cre} — ABNORMAL HIGH (ref 0.00–0.15)
Total Protein, Urine: 714 mg/dL

## 2017-05-27 LAB — GLUCOSE, CAPILLARY
GLUCOSE-CAPILLARY: 165 mg/dL — AB (ref 65–99)
Glucose-Capillary: 149 mg/dL — ABNORMAL HIGH (ref 65–99)
Glucose-Capillary: 148 mg/dL — ABNORMAL HIGH (ref 65–99)
Glucose-Capillary: 111 mg/dL — ABNORMAL HIGH (ref 65–99)

## 2017-05-27 LAB — TYPE AND SCREEN
ABO/RH(D): A NEG
ANTIBODY SCREEN: NEGATIVE
Unit division: 0

## 2017-05-27 LAB — URINALYSIS, ROUTINE W REFLEX MICROSCOPIC
BILIRUBIN URINE: NEGATIVE
Bacteria, UA: NONE SEEN
Glucose, UA: 50 mg/dL — AB
HGB URINE DIPSTICK: NEGATIVE
Ketones, ur: NEGATIVE mg/dL
LEUKOCYTES UA: NEGATIVE
Nitrite: NEGATIVE
PH: 5 (ref 5.0–8.0)
Protein, ur: >=300 mg/dL — AB
SPECIFIC GRAVITY, URINE: 1.017 (ref 1.005–1.030)
Squamous Epithelial / LPF: NONE SEEN

## 2017-05-27 LAB — CBC
HCT: 24.5 % — ABNORMAL LOW (ref 40.0–52.0)
Hemoglobin: 8.1 g/dL — ABNORMAL LOW (ref 13.0–18.0)
MCH: 28.7 pg (ref 26.0–34.0)
MCHC: 33.1 g/dL (ref 32.0–36.0)
MCV: 86.5 fL (ref 80.0–100.0)
PLATELETS: 350 10*3/uL (ref 150–440)
RBC: 2.83 MIL/uL — ABNORMAL LOW (ref 4.40–5.90)
RDW: 15 % — AB (ref 11.5–14.5)
WBC: 10.9 10*3/uL — ABNORMAL HIGH (ref 3.8–10.6)

## 2017-05-27 LAB — CREATININE, SERUM
CREATININE: 2.86 mg/dL — AB (ref 0.61–1.24)
GFR calc Af Amer: 24 mL/min — ABNORMAL LOW (ref >60)
GFR calc non Af Amer: 20 mL/min — ABNORMAL LOW (ref >60)

## 2017-05-27 LAB — BPAM RBC
BLOOD PRODUCT EXPIRATION DATE: 201902132359
ISSUE DATE / TIME: 201902081742
Unit Type and Rh: 9500

## 2017-05-27 MED ORDER — METOPROLOL SUCCINATE ER 50 MG PO TB24
50.0000 mg | ORAL_TABLET | Freq: Every day | ORAL | Status: DC
  Administered 2017-05-27 – 2017-05-30 (×4): 50 mg via ORAL
  Filled 2017-05-27 (×4): qty 1

## 2017-05-27 NOTE — Progress Notes (Addendum)
Patient Name: Thomas [name] Date of Encounter: 05/27/2017  Hospital Problem List     Principal Problem:   Chest pain Active Problems:   DM (diabetes mellitus), type 2 with renal complications (HCC)   Essential (primary) hypertension   Hyperlipidemia   CAD (coronary artery disease) of artery bypass graft   Acute on chronic renal failure (HCC)   NSTEMI (non-ST elevated myocardial infarction) [facility])    Patient Profile     72 year old male with history of chronic kidney disease grade 4, history of ischemic cardiomyopathy with an ejection fraction of 35%, history of three-vessel coronary disease status post mild troponin elevation consistent with probable non-ST elevation myocardial infarction.  Subjective   No further chest pain unless he moves from side to side.  Inpatient Medications    . aspirin EC  81 mg Oral Daily  . atorvastatin  80 mg Oral Daily  . insulin aspart  0-5 Units Subcutaneous QHS  . insulin aspart  0-9 Units Subcutaneous TID WC  . isosorbide mononitrate  60 mg Oral Daily  . metoprolol succinate  50 mg Oral Daily  . pantoprazole  40 mg Oral Daily  . ramelteon  8 mg Oral QHS    Vital Signs    Vitals:   05/26/17 2040 05/26/17 2109 05/27/17 0410 05/27/17 0810  BP: 133/72 131/64 140/77 (!) 168/74  Pulse: 86 87 89 87  Resp: 17  18   Temp: 98.2 F (36.8 C) 98.2 F (36.8 C) (!) 97.5 F (36.4 C) 97.6 F (36.4 C)  TempSrc: Oral Oral Oral Oral  SpO2: 98% 98% 100% 96%  Weight:      Height:        Intake/Output Summary (Last 24 hours) at 05/27/2017 0832 Last data filed at 05/27/2017 0814 Gross per 24 hour  Intake 1140 ml  Output 925 ml  Net 215 ml   Filed Weights   05/24/17 2120  Weight: 75.3 kg (166 lb)    Physical Exam    GEN: Well nourished, well developed, in no acute distress.  HEENT: normal.  Neck: Supple, no JVD, carotid bruits, or masses. Cardiac: RRR, no murmurs, rubs, or gallops. No clubbing, cyanosis, edema.  Radials/DP/PT 2+ and equal  bilaterally.  Respiratory:  Respirations regular and unlabored, clear to auscultation bilaterally. GI: Soft, nontender, nondistended, BS + x 4. MS: no deformity or atrophy. Skin: warm and dry, no rash. Neuro:  Strength and sensation are intact. Psych: Normal affect.  Labs    CBC Recent Labs    05/26/17 0403 05/27/17 0435  WBC 11.0* 10.9*  HGB 7.4* 8.1*  HCT 22.5* 24.5*  MCV 87.3 86.5  PLT 375 272   Basic Metabolic Panel Recent Labs    05/25/17 0550 05/26/17 0628 05/27/17 0435  NA 137 137  --   K 4.7 5.4*  --   CL 112* 111  --   CO2 16* 17*  --   GLUCOSE 93 132*  --   BUN 38* 44*  --   CREATININE 2.62* 2.77* 2.86*  CALCIUM 8.3* 8.5*  --    Liver Function Tests No results for input(s): AST, ALT, ALKPHOS, BILITOT, PROT, ALBUMIN in the last 72 hours. No results for input(s): LIPASE, AMYLASE in the last 72 hours. Cardiac Enzymes Recent Labs    05/24/17 2352 05/25/17 0550 05/25/17 1248  TROPONINI 0.44* 0.58* 0.55*   BNP No results for input(s): BNP in the last 72 hours. D-Dimer No results for input(s): DDIMER in the last 72 hours.  Hemoglobin A1C No results for input(s): HGBA1C in the last 72 hours. Fasting Lipid Panel No results for input(s): CHOL, HDL, LDLCALC, TRIG, CHOLHDL, LDLDIRECT in the last 72 hours. Thyroid Function Tests No results for input(s): TSH, T4TOTAL, T3FREE, THYROIDAB in the last 72 hours.  Invalid input(s): FREET3  Telemetry    Sinus rhythm with PVCs  ECG    Sinus rhythm with right bundle branch block.  PVCs are present.  Radiology    Dg Chest 2 View  Result Date: 05/24/2017 CLINICAL DATA:  Left chest pain. EXAM: CHEST  2 VIEW COMPARISON:  None. FINDINGS: Cardiomegaly. The hila and mediastinum are normal. No pulmonary nodules or masses. No focal infiltrates. No overt edema. IMPRESSION: No active cardiopulmonary disease. Electronically Signed   By: Dorise Bullion III M.D   On: 05/24/2017 21:55    Assessment & Plan    Coronary  artery disease-patient with documented three-vessel coronary disease with occluded RCA, occluded left circumflex and a 70% proximal LAD by cardiac catheterization in July 2018 with an ejection fraction of 35%.  He had an occluded saphenous vein graft to the RCA.  Attempted medical management.  Now has returned with further chest pain and elevation in his serum troponin.  Will need consideration for redo cardiac cath in preparation for possible redo coronary artery bypass grafting.  Cath has been deferred thus far secondary to renal insufficiency acute on chronic.  Patient has been taken off of heparin due to drop in hemoglobin.  No obvious source of bleeding.  Creatinine is elevated today again.  We will continue careful hydration.  Avoid nephrotoxic drugs which is already been addressed.  Appreciate nephrology input.  Patient is tentatively scheduled for cardiac catheterization on May 29, 2017.  Chronic kidney disease grade 4-appreciate nephrology input.  We will continue to carefully hydrate and limit any nephrotoxic drugs.  Patient will be at high risk for worsening renal failure including requiring hemodialysis with a contrast study.  We will try to optimize his risk and make final decisions prior to cath on February 11.  Cardiomyopathy-secondary to coronary artery disease.  EF 35-40%.  Not a candidate for afterload reduction of presence due to worsening renal function.  May need to alter this based on renal function.  Patient is on evidence-based beta-blocker.  Hypertension-blood pressure is somewhat elevated.  We will continue with isosorbide mononitrate at 60 mg daily with consideration for increasing this to 120 based on blood pressure response.  We will increase his metoprolol succinate to 50 mg daily following heart rate and blood pressure response  Diabetes mellitus.-Patient is off of metformin.  Will remain off of this due to renal function.  ADA diet.  Systolic CHF-currently stable.  We  will need to closely follow with careful hydration.    Anemia-globin dropped to 8.3 a level of 15.211 months ago.  Increased to 7.4 yesterday he was transfused.  Hemoglobin 8.1 today.  We will need to closely follow this preparation for consideration for possible coronary evaluation.  Signed, Javier Docker [place] MD 05/27/2017, 8:32 AM  Pager: [phone]

## 2017-05-27 NOTE — Progress Notes (Signed)
[place] [facility] [place] NAME: Thomas [name]    MR#:  [id number]  DATE OF BIRTH:  09-18-1945  SUBJECTIVE:   Patient came in with on and off chest pain.  Patient feels a lot better today.  He said he slept very good REVIEW OF SYSTEMS:   Review of Systems  Constitutional: Negative for chills, fever and weight loss.  HENT: Negative for ear discharge, ear pain and nosebleeds.   Eyes: Negative for blurred vision, pain and discharge.  Respiratory: Negative for sputum production, shortness of breath, wheezing and stridor.   Cardiovascular: Negative for chest pain, palpitations, orthopnea and PND.  Gastrointestinal: Negative for abdominal pain, diarrhea, nausea and vomiting.  Genitourinary: Negative for frequency and urgency.  Musculoskeletal: Negative for back pain and joint pain.  Neurological: Negative for sensory change, speech change, focal weakness and weakness.  Psychiatric/Behavioral: Negative for depression and hallucinations. The patient is not nervous/anxious.    Tolerating Diet: Yes Tolerating PT: Not needed  DRUG ALLERGIES:   Allergies  Allergen Reactions  . Ciprofloxacin Itching and Swelling    Facial swelling     VITALS:  Blood pressure (!) 168/74, pulse 87, temperature 97.6 F (36.4 C), temperature source Oral, resp. rate 18, height 5\' 6"  (1.676 m), weight 75.3 kg (166 lb), SpO2 96 %.  PHYSICAL EXAMINATION:   Physical Exam  GENERAL:  72 y.o.-year-old patient lying in the bed with no acute distress.  EYES: Pupils equal, round, reactive to light and accommodation. No scleral icterus. Extraocular muscles intact.  HEENT: Head atraumatic, normocephalic. Oropharynx and nasopharynx clear.  NECK:  Supple, no jugular venous distention. No thyroid enlargement, no tenderness.  LUNGS: Normal breath sounds bilaterally, no wheezing, rales, rhonchi. No use of accessory muscles of respiration.  CARDIOVASCULAR: S1, S2 normal. No  murmurs, rubs, or gallops.  ABDOMEN: Soft, nontender, nondistended. Bowel sounds present. No organomegaly or mass.  EXTREMITIES: No cyanosis, clubbing or edema b/l.    NEUROLOGIC: Cranial nerves II through XII are intact. No focal Motor or sensory deficits b/l.   PSYCHIATRIC:  patient is alert and oriented x 3.  SKIN: No obvious rash, lesion, or ulcer.   LABORATORY PANEL:  CBC Recent Labs  Lab 05/27/17 0435  WBC 10.9*  HGB 8.1*  HCT 24.5*  PLT 350    Chemistries  Recent Labs  Lab 05/26/17 0628 05/27/17 0435  NA 137  --   K 5.4*  --   CL 111  --   CO2 17*  --   GLUCOSE 132*  --   BUN 44*  --   CREATININE 2.77* 2.86*  CALCIUM 8.5*  --    Cardiac Enzymes Recent Labs  Lab 05/25/17 1248  TROPONINI 0.55*   RADIOLOGY:  US Renal  Result Date: 05/27/2017 CLINICAL DATA:  Acute renal failure, chronic kidney disease EXAM: RENAL / URINARY TRACT ULTRASOUND COMPLETE COMPARISON:  CT 04/01/2014 FINDINGS: Right Kidney: Length: 9.2 cm. Increased echotexture throughout the right kidney. No mass or hydronephrosis. Left Kidney: Length: 10.9 cm. 2.4 cm upper pole cyst, stable since prior CT. Increased echotexture. No hydronephrosis. Bladder: Appears normal for degree of bladder distention. IMPRESSION: Increased echotexture within the kidneys bilaterally compatible with chronic medical renal disease. No hydronephrosis. Electronically Signed   By: Rolm Baptise M.D.   On: 05/27/2017 15:15   ASSESSMENT AND PLAN:  Thomas [name]  is a 72 y.o. male who presents with recurring and increasing episodes of chest pain.  Patient states that he  had an episode earlier today which was the most severe that it has been.  It is central chest pain, pressure-like in nature, radiated to his shoulder blades  1.  Unstable angina/chest pain -with elevated troponin.   -heparin drip started--now on HOLD due to low hgb. No active  bleeding - Troponin  0.35--0.44--0.58--.55 -pt seen by Dr Vickii Penna recommendations  --- possible Cath on monday  2. CAD (coronary artery disease) of artery bypass graft  -continue home meds  3.  DM (diabetes mellitus), type 2 with renal complications (HCC -sliding scale insulin   4.  Essential (primary) hypertension -continue home meds    5.Acute on chronic renal failure (HCC) -avoid nephrotoxins and monitor -basleine creat 1.6-1.8 -creat 2.66--2.77--2.8 -recieved IVF at low rate  6.  Hyperlipidemia -continue home meds   Case discussed with Care Management/Social Worker. Management plans discussed with the patient, family and they are in agreement.  CODE STATUS: FULL  DVT Prophylaxis: heparin  TOTAL TIME TAKING CARE OF THIS PATIENT: 40 minutes.  >50% time spent on counselling and coordination of care  POSSIBLE D/C IN *1-2* DAYS, DEPENDING ON CLINICAL CONDITION.  Note: This dictation was prepared with Dragon dictation along with smaller phrase technology. Any transcriptional errors that result from this process are unintentional.  Fritzi Mandes M.D on 05/27/2017 at 3:37 PM  Between 7am to 6pm - Pager - [phone]  After 6pm go to [email or web address] - password [email or web address] [facility]  Office  [phone]  CC: Primary care physician; Birdie Sons, MDPatient ID: Thomas [name], male   DOB: 10-25-1945, 72 y.o.   MRN: [id number]

## 2017-05-27 NOTE — Progress Notes (Signed)
[facility], [place] 05/27/17  Subjective:   No significant clinical change Patient feels well this morning Serum creatinine slightly increased to 2.86 from 2.77  Objective:  Vital signs in last 24 hours:  Temp:  [97.5 F (36.4 C)-98.2 F (36.8 C)] 97.6 F (36.4 C) (02/09 0810) Pulse Rate:  [73-89] 87 (02/09 0810) Resp:  [17-18] 18 (02/09 0410) BP: (127-168)/(62-77) 168/74 (02/09 0810) SpO2:  [96 %-100 %] 96 % (02/09 0810)  Weight change:  Filed Weights   05/24/17 2120  Weight: 75.3 kg (166 lb)    Intake/Output:    Intake/Output Summary (Last 24 hours) at 05/27/2017 1159 Last data filed at 05/27/2017 1156 Gross per 24 hour  Intake 1020 ml  Output 1175 ml  Net -155 ml     Physical Exam: General:  Laying in the bed, no acute distress  HEENT  anicteric, moist oral mucous membrane  Neck  supple  Pulm/lungs  clear to auscultation bilaterally  CVS/Heart  regular rhythm, S3 gallop  Abdomen:   Soft, nontender, nondistended  Extremities:  No peripheral edema  Neurologic:  Alert, oriented  Skin:  No acute rashes          Basic Metabolic Panel:  Recent Labs  Lab 05/24/17 07-10-2124 05/25/17 0550 05/26/17 0628 05/27/17 0435  NA 137 137 137  --   K 4.8 4.7 5.4*  --   CL 109 112* 111  --   CO2 17* 16* 17*  --   GLUCOSE 242* 93 132*  --   BUN 38* 38* 44*  --   CREATININE 2.66* 2.62* 2.77* 2.86*  CALCIUM 8.5* 8.3* 8.5*  --      CBC: Recent Labs  Lab 05/24/17 10-Jul-2124 05/25/17 0550 05/26/17 0403 05/27/17 0435  WBC 10.9* 9.7 11.0* 10.9*  HGB 8.3* 7.9* 7.4* 8.1*  HCT 24.9* 23.8* 22.5* 24.5*  MCV 88.1 87.6 87.3 86.5  PLT 403 377 375 350     No results found for: HEPBSAG, HEPBSAB, HEPBIGM    Microbiology:  No results found for this or any previous visit (from the past 240 hour(s)).  Coagulation Studies: Recent Labs    05/24/17 10-Jul-2124  LABPROT 13.7  INR 1.06    Urinalysis: No results for input(s): COLORURINE, LABSPEC,  PHURINE, GLUCOSEU, HGBUR, BILIRUBINUR, KETONESUR, PROTEINUR, UROBILINOGEN, NITRITE, LEUKOCYTESUR in the last 72 hours.  Invalid input(s): APPERANCEUR    Imaging: No results found.   Medications:    . aspirin EC  81 mg Oral Daily  . atorvastatin  80 mg Oral Daily  . insulin aspart  0-5 Units Subcutaneous QHS  . insulin aspart  0-9 Units Subcutaneous TID WC  . isosorbide mononitrate  60 mg Oral Daily  . metoprolol succinate  50 mg Oral Daily  . pantoprazole  40 mg Oral Daily  . ramelteon  8 mg Oral QHS   acetaminophen **OR** acetaminophen, HYDROcodone-acetaminophen, ondansetron **OR** ondansetron (ZOFRAN) IV  Assessment/ Plan:  72 y.o. male with diabetes, hypertension, severe coronary disease, CABG 07-11-94, peripheral arterial disease, was admitted on 05/24/2017 with non-STEMI.   1.  Chronic kidney disease stage IV 2.  Diabetes type 2 with chronic kidney disease 3.  Current smoker  4.  Chronic systolic CHF; LVEF 50-09%  Patient appears to have severe underlying chronic kidney disease.  His creatinine from 2 years ago is 2.07, GFR 30 but at present, his creatinine is higher at 2.66.  This may represent progression of underlying chronic kidney disease Today's creatinine is slightly higher at  2.86 We plan to get a renal ultrasound Recommended patient to quit smoking Plan to proceed with cardiac catheterization likely on Monday.  Administer preop IV hydration with normal saline 2 hours prior to procedure continuing 4-6 hours postprocedure. May restart ACE inhibitor once serum creatinine stabilizes    LOS: [place] 2/9/201911:59 AM  [place], [place]  Note: This note was prepared with Dragon dictation. Any transcription errors are unintentional

## 2017-05-28 LAB — BASIC METABOLIC PANEL
ANION GAP: 10 (ref 5–15)
BUN: 43 mg/dL — ABNORMAL HIGH (ref 6–20)
CALCIUM: 9.1 mg/dL (ref 8.9–10.3)
CO2: 18 mmol/L — ABNORMAL LOW (ref 22–32)
Chloride: 110 mmol/L (ref 101–111)
Creatinine, Ser: 2.6 mg/dL — ABNORMAL HIGH (ref 0.61–1.24)
GFR, EST AFRICAN AMERICAN: 26 mL/min — AB (ref >60)
GFR, EST NON AFRICAN AMERICAN: 23 mL/min — AB (ref >60)
Glucose, Bld: 150 mg/dL — ABNORMAL HIGH (ref 65–99)
POTASSIUM: 5.5 mmol/L — AB (ref 3.5–5.1)
Sodium: 138 mmol/L (ref 135–145)

## 2017-05-28 LAB — CBC
HEMATOCRIT: 27.5 % — AB (ref 40.0–52.0)
HEMOGLOBIN: 9.1 g/dL — AB (ref 13.0–18.0)
MCH: 28.8 pg (ref 26.0–34.0)
MCHC: 33.2 g/dL (ref 32.0–36.0)
MCV: 86.9 fL (ref 80.0–100.0)
Platelets: 376 10*3/uL (ref 150–440)
RBC: 3.16 MIL/uL — AB (ref 4.40–5.90)
RDW: 15.1 % — ABNORMAL HIGH (ref 11.5–14.5)
WBC: 10.9 10*3/uL — ABNORMAL HIGH (ref 3.8–10.6)

## 2017-05-28 LAB — POTASSIUM
Potassium: 5.5 mmol/L — ABNORMAL HIGH (ref 3.5–5.1)
Potassium: 5.6 mmol/L — ABNORMAL HIGH (ref 3.5–5.1)

## 2017-05-28 LAB — GLUCOSE, CAPILLARY
GLUCOSE-CAPILLARY: 118 mg/dL — AB (ref 65–99)
GLUCOSE-CAPILLARY: 126 mg/dL — AB (ref 65–99)
Glucose-Capillary: 153 mg/dL — ABNORMAL HIGH (ref 65–99)
Glucose-Capillary: 162 mg/dL — ABNORMAL HIGH (ref 65–99)

## 2017-05-28 MED ORDER — SODIUM POLYSTYRENE SULFONATE 15 GM/60ML PO SUSP
30.0000 g | Freq: Once | ORAL | Status: AC
  Administered 2017-05-28: 30 g via ORAL
  Filled 2017-05-28: qty 120

## 2017-05-28 MED ORDER — SODIUM POLYSTYRENE SULFONATE 15 GM/60ML PO SUSP
15.0000 g | Freq: Once | ORAL | Status: AC
  Administered 2017-05-28: 15 g via ORAL
  Filled 2017-05-28: qty 60

## 2017-05-28 MED ORDER — SODIUM CHLORIDE 0.9% FLUSH
3.0000 mL | Freq: Two times a day (BID) | INTRAVENOUS | Status: DC
  Administered 2017-05-28 – 2017-05-30 (×4): 3 mL via INTRAVENOUS

## 2017-05-28 NOTE — Progress Notes (Signed)
[facility] [place] NAME: Thomas [name]    MR#:  [id number]  DATE OF BIRTH:  05/10/1945  SUBJECTIVE:   Patient came in with on and off chest pain.   Patient feels a lot better today.  Status post PRBC transfusion.  Reporting intermittent episodes of left-sided back pain radiating to the left side of the chest and shoulder REVIEW OF SYSTEMS:   Review of Systems  Constitutional: Negative for chills, fever and weight loss.  HENT: Negative for ear discharge, ear pain and nosebleeds.   Eyes: Negative for blurred vision, pain and discharge.  Respiratory: Negative for sputum production, shortness of breath, wheezing and stridor.   Cardiovascular: Negative for chest pain, palpitations, orthopnea and PND.  Gastrointestinal: Negative for abdominal pain, diarrhea, nausea and vomiting.  Genitourinary: Negative for frequency and urgency.  Musculoskeletal: Negative for back pain and joint pain.  Neurological: Negative for sensory change, speech change, focal weakness and weakness.  Psychiatric/Behavioral: Negative for depression and hallucinations. The patient is not nervous/anxious.    Tolerating Diet: Yes Tolerating PT: Not needed  DRUG ALLERGIES:   Allergies  Allergen Reactions  . Ciprofloxacin Itching and Swelling    Facial swelling     VITALS:  Blood pressure (!) 176/83, pulse 85, temperature (!) 97.5 F (36.4 C), temperature source Oral, resp. rate 16, height 5\' 6"  (1.676 m), weight 76.2 kg (167 lb 14.4 oz), SpO2 98 %.  PHYSICAL EXAMINATION:   Physical Exam  GENERAL:  72 y.o.-year-old patient lying in the bed with no acute distress.  EYES: Pupils equal, round, reactive to light and accommodation. No scleral icterus. Extraocular muscles intact.  HEENT: Head atraumatic, normocephalic. Oropharynx and nasopharynx clear.  NECK:  Supple, no jugular venous distention. No thyroid enlargement, no tenderness.  LUNGS: Normal breath  sounds bilaterally, no wheezing, rales, rhonchi. No use of accessory muscles of respiration.  CARDIOVASCULAR: S1, S2 normal. No murmurs, rubs, or gallops.  ABDOMEN: Soft, nontender, nondistended. Bowel sounds present. No organomegaly or mass.  EXTREMITIES: No cyanosis, clubbing or edema b/l.    NEUROLOGIC: Cranial nerves II through XII are intact. No focal Motor or sensory deficits b/l.   PSYCHIATRIC:  patient is alert and oriented x 3.  SKIN: No obvious rash, lesion, or ulcer.   LABORATORY PANEL:  CBC Recent Labs  Lab 05/28/17 0829  WBC 10.9*  HGB 9.1*  HCT 27.5*  PLT 376    Chemistries  Recent Labs  Lab 05/28/17 0829 05/28/17 1459  NA 138  --   K 5.5* 5.5*  CL 110  --   CO2 18*  --   GLUCOSE 150*  --   BUN 43*  --   CREATININE 2.60*  --   CALCIUM 9.1  --    Cardiac Enzymes Recent Labs  Lab 05/25/17 1248  TROPONINI 0.55*   RADIOLOGY:  US Renal  Result Date: 05/27/2017 CLINICAL DATA:  Acute renal failure, chronic kidney disease EXAM: RENAL / URINARY TRACT ULTRASOUND COMPLETE COMPARISON:  CT 04/01/2014 FINDINGS: Right Kidney: Length: 9.2 cm. Increased echotexture throughout the right kidney. No mass or hydronephrosis. Left Kidney: Length: 10.9 cm. 2.4 cm upper pole cyst, stable since prior CT. Increased echotexture. No hydronephrosis. Bladder: Appears normal for degree of bladder distention. IMPRESSION: Increased echotexture within the kidneys bilaterally compatible with chronic medical renal disease. No hydronephrosis. Electronically Signed   By: Rolm Baptise M.D.   On: 05/27/2017 15:15   ASSESSMENT AND PLAN:  Thomas [name]  is a 72 y.o. male who presents with recurring and increasing episodes of chest pain.  Patient states that he had an episode earlier today which was the most severe that it has been.  It is central chest pain, pressure-like in nature, radiated to his shoulder blades  1.  Unstable angina/chest pain -with elevated troponin.   -heparin drip started--now  on HOLD due to low hgb. No active  bleeding - Troponin  0.35--0.44--0.58--.55 -pt seen by Dr Vickii Penna recommendations --- Cath on monday  2. CAD (coronary artery disease) of artery bypass graft  -continue home meds  3.  DM (diabetes mellitus), type 2 with renal complications (HCC -sliding scale insulin   4.  Essential (primary) hypertension -continue home meds    5.Acute on chronic renal failure (HCC) -avoid nephrotoxins and monitor -basleine creat 1.6-1.8 -creat 2.66--2.77--2.8-2.45--2.60 -recieved IVF at low rate -Nephrology is  Following -Renal ultrasound has revealed chronic kidney disease but no acute findings  6.  Hyperlipidemia -continue home meds  7.  Hyperkalemia potassium at 5.5 Kayexalate given, rpt pot   Case discussed with Care Management/Social Worker. Management plans discussed with the patient, family and they are in agreement.  CODE STATUS: FULL  DVT Prophylaxis: heparin  TOTAL TIME TAKING CARE OF THIS PATIENT:36 minutes.  >50% time spent on counselling and coordination of care  POSSIBLE D/C IN *1-2* DAYS, DEPENDING ON CLINICAL CONDITION.  Note: This dictation was prepared with Dragon dictation along with smaller phrase technology. Any transcriptional errors that result from this process are unintentional.  Nicholes Mango M.D on 05/28/2017 at 3:46 PM  Between 7am to 6pm - Pager - [phone]  After 6pm go to [email or web address] - password [email or web address] [facility]  Office  [phone]  CC: Primary care physician; Birdie Sons, MDPatient ID: Thomas [name], male   DOB: 10/04/1945, 72 y.o.   MRN: [id number]

## 2017-05-28 NOTE — Progress Notes (Signed)
Pt. Woke only to use urinal, c/o pain x2, medicated with effective results. No SOB or acute distress noted. Will continue to monitor pt.

## 2017-05-28 NOTE — Progress Notes (Signed)
Pt family in room, patient requests to leave bed alarm off while wife and daughter at bedside at this time

## 2017-05-28 NOTE — Progress Notes (Signed)
Patient Name: Thomas [name] Date of Encounter: 05/28/2017  Hospital Problem List     Principal Problem:   Chest pain Active Problems:   DM (diabetes mellitus), type 2 with renal complications (HCC)   Essential (primary) hypertension   Hyperlipidemia   CAD (coronary artery disease) of artery bypass graft   Acute on chronic renal failure (HCC)   NSTEMI (non-ST elevated myocardial infarction) [facility])    Patient Profile     72 year old male with three-vessel coronary disease admitted with non-ST elevation myocardial infarction and persistent chest pain with minimal activity consistent with class III-IV angina.  Disease.  Plan for left heart cath tomorrow morning to guide further therapy regarding PCI versus redo cabg.   Subjective   Asymptomatic at rest, chest pain with minimal activity.  Denies shortness of breath.  Inpatient Medications    . aspirin EC  81 mg Oral Daily  . atorvastatin  80 mg Oral Daily  . insulin aspart  0-5 Units Subcutaneous QHS  . insulin aspart  0-9 Units Subcutaneous TID WC  . isosorbide mononitrate  60 mg Oral Daily  . metoprolol succinate  50 mg Oral Daily  . pantoprazole  40 mg Oral Daily  . ramelteon  8 mg Oral QHS  . sodium chloride flush  3 mL Intravenous Q12H  . sodium polystyrene  30 g Oral Once    Vital Signs    Vitals:   05/27/17 0810 05/27/17 1730 05/28/17 0237 05/28/17 0755  BP: (!) 168/74 135/73 (!) 160/89 (!) 176/83  Pulse: 87 82 92 85  Resp:   18 16  Temp: 97.6 F (36.4 C) 98 F (36.7 C) (!) 97.3 F (36.3 C) (!) 97.5 F (36.4 C)  TempSrc: Oral Oral Oral Oral  SpO2: 96% 97% 97% 98%  Weight:   76.2 kg (167 lb 14.4 oz)   Height:        Intake/Output Summary (Last 24 hours) at 05/28/2017 0931 Last data filed at 05/28/2017 0843 Gross per 24 hour  Intake 483 ml  Output 850 ml  Net -367 ml   Filed Weights   05/24/17 2120 05/28/17 0237  Weight: 75.3 kg (166 lb) 76.2 kg (167 lb 14.4 oz)    Physical Exam    GEN: Well  nourished, well developed, in no acute distress.  HEENT: normal.  Neck: Supple, no JVD, carotid bruits, or masses. Cardiac: RRR, no murmurs, rubs, or gallops. No clubbing, cyanosis, edema.  Radials/DP/PT 2+ and equal bilaterally.  Respiratory:  Respirations regular and unlabored, clear to auscultation bilaterally. GI: Soft, nontender, nondistended, BS + x 4. MS: no deformity or atrophy. Skin: warm and dry, no rash. Neuro:  Strength and sensation are intact. Psych: Normal affect.  Labs    CBC Recent Labs    05/27/17 0435 05/28/17 0829  WBC 10.9* 10.9*  HGB 8.1* 9.1*  HCT 24.5* 27.5*  MCV 86.5 86.9  PLT 350 767   Basic Metabolic Panel Recent Labs    05/26/17 0628 05/27/17 0435 05/28/17 0829  NA 137  --  138  K 5.4*  --  5.5*  CL 111  --  110  CO2 17*  --  18*  GLUCOSE 132*  --  150*  BUN 44*  --  43*  CREATININE 2.77* 2.86* 2.60*  CALCIUM 8.5*  --  9.1   Liver Function Tests No results for input(s): AST, ALT, ALKPHOS, BILITOT, PROT, ALBUMIN in the last 72 hours. No results for input(s): LIPASE, AMYLASE in the last  72 hours. Cardiac Enzymes Recent Labs    05/25/17 1248  TROPONINI 0.55*   BNP No results for input(s): BNP in the last 72 hours. D-Dimer No results for input(s): DDIMER in the last 72 hours. Hemoglobin A1C No results for input(s): HGBA1C in the last 72 hours. Fasting Lipid Panel No results for input(s): CHOL, HDL, LDLCALC, TRIG, CHOLHDL, LDLDIRECT in the last 72 hours. Thyroid Function Tests No results for input(s): TSH, T4TOTAL, T3FREE, THYROIDAB in the last 72 hours.  Invalid input(s): FREET3  Telemetry    Normal sinus rhythm  ECG    Sinus rhythm with right bundle branch block.  No change from baseline.  Radiology    Dg Chest 2 View  Result Date: 05/24/2017 CLINICAL DATA:  Left chest pain. EXAM: CHEST  2 VIEW COMPARISON:  None. FINDINGS: Cardiomegaly. The hila and mediastinum are normal. No pulmonary nodules or masses. No focal  infiltrates. No overt edema. IMPRESSION: No active cardiopulmonary disease. Electronically Signed   By: Dorise Bullion III M.D   On: 05/24/2017 21:55   US Renal  Result Date: 05/27/2017 CLINICAL DATA:  Acute renal failure, chronic kidney disease EXAM: RENAL / URINARY TRACT ULTRASOUND COMPLETE COMPARISON:  CT 04/01/2014 FINDINGS: Right Kidney: Length: 9.2 cm. Increased echotexture throughout the right kidney. No mass or hydronephrosis. Left Kidney: Length: 10.9 cm. 2.4 cm upper pole cyst, stable since prior CT. Increased echotexture. No hydronephrosis. Bladder: Appears normal for degree of bladder distention. IMPRESSION: Increased echotexture within the kidneys bilaterally compatible with chronic medical renal disease. No hydronephrosis. Electronically Signed   By: Rolm Baptise M.D.   On: 05/27/2017 15:15    Assessment & Plan    Coronary artery disease-cardiac catheterization in July 2018 revealed occluded RCA with occluded saphenous vein graft to the RCA, occluded left circumflex and 70% proximal LAD with an ejection fraction of 35-40%.  Medical management was attempted.  Patient returned with chest pain and had a mild serum troponin elevation consistent with probable non-ST elevation myocardial infarction.  He is placed on IV heparin.  He had a mild to reduction in his hemoglobin and heparin was stopped.  He has done fairly well since that time.  Hemoglobin has stabilized after transfusion of 1 unit of packed red blood cells.  Cardiac catheterization was deferred secondary to worsening renal insufficiency, he was seen by nephrology and his creatinine has improved.  Recommendations are to proceed with left heart cath limiting diet as much as possible and hydration prior to the procedure.  After relook cardiac catheterization which is scheduled tomorrow morning, decision regarding redo coronary bypass grafting versus PCI of the LAD will be determined.  Patient will remain on aspirin, isosorbide mononitrate,  metoprolol succinate at 50 mg daily and high intensity statin.  Will also need cardiac rehab phase 2 post discharge.  Renal insufficiency-creatinine has improved.  Discussion with nephrology regarding proceeding the left heart cardiac cath with contrast was extensively carried out.  The recommendations are to proceed with hydration and limiting contrast.  We will proceed as mentioned above in the morning.  Hyperlipidemia-continue with high intensity statin.   Anemia-possibly secondary to hydration.  No obvious bleeding.  Hemoglobin continues to rise and is stable.  Signed, Javier Docker Pacen Watford MD 05/28/2017, 9:31 AM  Pager: [phone]

## 2017-05-29 ENCOUNTER — Encounter
Admission: EM | Disposition: A | Discharge status: Home / Self Care | Payer: Self-pay | Source: Home / Self Care | Attending: Internal Medicine

## 2017-05-29 ENCOUNTER — Encounter: Payer: Self-pay | Admitting: Cardiology

## 2017-05-29 HISTORY — PX: CORONARY STENT INTERVENTION: CATH118234

## 2017-05-29 HISTORY — PX: LEFT HEART CATH AND CORONARY ANGIOGRAPHY: CATH118249

## 2017-05-29 LAB — CBC
HCT: 25 % — ABNORMAL LOW (ref 40.0–52.0)
Hemoglobin: 8.3 g/dL — ABNORMAL LOW (ref 13.0–18.0)
MCH: 29 pg (ref 26.0–34.0)
MCHC: 33.2 g/dL (ref 32.0–36.0)
MCV: 87.2 fL (ref 80.0–100.0)
Platelets: 391 10*3/uL (ref 150–440)
RBC: 2.86 MIL/uL — ABNORMAL LOW (ref 4.40–5.90)
RDW: 15.5 % — ABNORMAL HIGH (ref 11.5–14.5)
WBC: 12.8 10*3/uL — AB (ref 3.8–10.6)

## 2017-05-29 LAB — BASIC METABOLIC PANEL
ANION GAP: 11 (ref 5–15)
Anion gap: 10 (ref 5–15)
BUN: 45 mg/dL — ABNORMAL HIGH (ref 6–20)
BUN: 45 mg/dL — AB (ref 6–20)
CALCIUM: 8.7 mg/dL — AB (ref 8.9–10.3)
CALCIUM: 8.6 mg/dL — AB (ref 8.9–10.3)
CO2: 18 mmol/L — ABNORMAL LOW (ref 22–32)
CO2: 19 mmol/L — ABNORMAL LOW (ref 22–32)
CREATININE: 2.63 mg/dL — AB (ref 0.61–1.24)
CREATININE: 2.66 mg/dL — AB (ref 0.61–1.24)
Chloride: 109 mmol/L (ref 101–111)
Chloride: 106 mmol/L (ref 101–111)
GFR calc Af Amer: 26 mL/min — ABNORMAL LOW (ref >60)
GFR calc non Af Amer: 23 mL/min — ABNORMAL LOW (ref >60)
GFR, EST AFRICAN AMERICAN: 26 mL/min — AB (ref >60)
GFR, EST NON AFRICAN AMERICAN: 22 mL/min — AB (ref >60)
GLUCOSE: 187 mg/dL — AB (ref 65–99)
Glucose, Bld: 132 mg/dL — ABNORMAL HIGH (ref 65–99)
Potassium: 5.1 mmol/L (ref 3.5–5.1)
Potassium: 5.1 mmol/L (ref 3.5–5.1)
SODIUM: 138 mmol/L (ref 135–145)
SODIUM: 135 mmol/L (ref 135–145)

## 2017-05-29 LAB — GLUCOSE, CAPILLARY
GLUCOSE-CAPILLARY: 127 mg/dL — AB (ref 65–99)
GLUCOSE-CAPILLARY: 193 mg/dL — AB (ref 65–99)
GLUCOSE-CAPILLARY: 166 mg/dL — AB (ref 65–99)

## 2017-05-29 LAB — PROTIME-INR
INR: 1.02
PROTHROMBIN TIME: 13.3 s (ref 11.4–15.2)

## 2017-05-29 LAB — POCT ACTIVATED CLOTTING TIME: ACTIVATED CLOTTING TIME: 301 s

## 2017-05-29 SURGERY — LEFT HEART CATH AND CORONARY ANGIOGRAPHY
Anesthesia: Moderate Sedation

## 2017-05-29 MED ORDER — SODIUM CHLORIDE 0.9 % IV SOLN
INTRAVENOUS | Status: AC | PRN
  Administered 2017-05-29: 1 mg/kg/h via INTRAVENOUS

## 2017-05-29 MED ORDER — LORAZEPAM 2 MG/ML IJ SOLN
0.5000 mg | INTRAMUSCULAR | Status: DC | PRN
  Filled 2017-05-29: qty 0.25

## 2017-05-29 MED ORDER — SODIUM CHLORIDE 0.9% FLUSH: 3.0000 mL | INTRAVENOUS | Status: DC | PRN

## 2017-05-29 MED ORDER — MIDAZOLAM HCL 2 MG/2ML IJ SOLN
INTRAMUSCULAR | Status: AC
  Administered 2017-05-29: 2 mg
  Filled 2017-05-29: qty 2

## 2017-05-29 MED ORDER — FENTANYL CITRATE (PF) 100 MCG/2ML IJ SOLN
INTRAMUSCULAR | Status: AC
  Filled 2017-05-29: qty 2

## 2017-05-29 MED ORDER — MIDAZOLAM HCL 2 MG/2ML IJ SOLN
INTRAMUSCULAR | Status: AC
  Filled 2017-05-29: qty 2

## 2017-05-29 MED ORDER — NITROGLYCERIN 0.4 MG SL SUBL
SUBLINGUAL_TABLET | SUBLINGUAL | Status: AC
  Filled 2017-05-29: qty 1

## 2017-05-29 MED ORDER — MIDAZOLAM HCL 2 MG/2ML IJ SOLN
INTRAMUSCULAR | Status: DC | PRN
  Administered 2017-05-29: 1 mg via INTRAVENOUS
  Administered 2017-05-29: 2 mg

## 2017-05-29 MED ORDER — ALUM & MAG HYDROXIDE-SIMETH 200-200-20 MG/5ML PO SUSP
30.0000 mL | Freq: Once | ORAL | Status: AC
  Administered 2017-05-29: 30 mL via ORAL

## 2017-05-29 MED ORDER — FENTANYL CITRATE (PF) 100 MCG/2ML IJ SOLN
INTRAMUSCULAR | Status: DC | PRN
  Administered 2017-05-29: 25 ug via INTRAVENOUS

## 2017-05-29 MED ORDER — BIVALIRUDIN BOLUS VIA INFUSION - CUPID
INTRAVENOUS | Status: DC | PRN
  Administered 2017-05-29: 55.35 mg via INTRAVENOUS

## 2017-05-29 MED ORDER — SODIUM CHLORIDE 0.9 % IV SOLN
INTRAVENOUS | Status: DC
  Administered 2017-05-29: 06:00:00 via INTRAVENOUS

## 2017-05-29 MED ORDER — HEPARIN (PORCINE) IN NACL 2-0.9 UNIT/ML-% IJ SOLN
INTRAMUSCULAR | Status: AC
  Filled 2017-05-29: qty 500

## 2017-05-29 MED ORDER — SODIUM CHLORIDE 0.9% FLUSH: 3.0000 mL | Freq: Two times a day (BID) | INTRAVENOUS | Status: DC

## 2017-05-29 MED ORDER — CLOPIDOGREL BISULFATE 75 MG PO TABS
ORAL_TABLET | ORAL | Status: AC
  Filled 2017-05-29: qty 8

## 2017-05-29 MED ORDER — CLOPIDOGREL BISULFATE 75 MG PO TABS
ORAL_TABLET | ORAL | Status: DC | PRN
  Administered 2017-05-29: 600 mg via ORAL

## 2017-05-29 MED ORDER — BIVALIRUDIN TRIFLUOROACETATE 250 MG IV SOLR
INTRAVENOUS | Status: AC
  Filled 2017-05-29: qty 250

## 2017-05-29 MED ORDER — SODIUM CHLORIDE 0.9 % IV SOLN: 250.0000 mL | INTRAVENOUS | Status: DC | PRN

## 2017-05-29 MED ORDER — ALUM & MAG HYDROXIDE-SIMETH 200-200-20 MG/5ML PO SUSP
ORAL | Status: AC
  Administered 2017-05-29: 30 mL via ORAL
  Filled 2017-05-29: qty 30

## 2017-05-29 MED ORDER — METOPROLOL TARTRATE 5 MG/5ML IV SOLN
INTRAVENOUS | Status: AC
  Filled 2017-05-29: qty 5

## 2017-05-29 MED ORDER — NITROGLYCERIN 5 MG/ML IV SOLN
INTRAVENOUS | Status: AC
Start: 2017-05-29 — End: 2017-05-29
  Filled 2017-05-29: qty 10

## 2017-05-29 MED ORDER — NITROGLYCERIN 1 MG/10 ML FOR IR/CATH LAB
INTRA_ARTERIAL | Status: DC | PRN
  Administered 2017-05-29: 200 ug via INTRACORONARY

## 2017-05-29 MED ORDER — METOPROLOL TARTRATE 5 MG/5ML IV SOLN
INTRAVENOUS | Status: DC | PRN
Start: 2017-05-29 — End: 2017-05-29
  Administered 2017-05-29: 5 mg via INTRAVENOUS

## 2017-05-29 MED ORDER — ASPIRIN 81 MG PO CHEW
CHEWABLE_TABLET | ORAL | Status: AC
  Filled 2017-05-29: qty 4

## 2017-05-29 MED ORDER — ASPIRIN 81 MG PO CHEW: 81.0000 mg | CHEWABLE_TABLET | ORAL | Status: DC

## 2017-05-29 MED ORDER — IOPAMIDOL (ISOVUE-300) INJECTION 61%
INTRAVENOUS | Status: DC | PRN
  Administered 2017-05-29: 165 mL via INTRA_ARTERIAL

## 2017-05-29 SURGICAL SUPPLY — 16 items
BALLN TREK RX 2.5X15 (BALLOONS) ×3
BALLOON TREK RX 2.5X15 (BALLOONS) ×1 IMPLANT
CATH INFINITI 5FR ANG PIGTAIL (CATHETERS) ×3 IMPLANT
CATH INFINITI 5FR JL4 (CATHETERS) ×3 IMPLANT
CATH INFINITI JR4 5F (CATHETERS) ×3 IMPLANT
CATH VISTA GUIDE 6FR XB3.5 SH (CATHETERS) ×3 IMPLANT
DEVICE CLOSURE MYNXGRIP 6/7F (Vascular Products) ×3 IMPLANT
DEVICE INFLAT 30 PLUS (MISCELLANEOUS) ×3 IMPLANT
KIT MANI 3VAL PERCEP (MISCELLANEOUS) ×3 IMPLANT
NEEDLE PERC 18GX7CM (NEEDLE) ×3 IMPLANT
PACK CARDIAC CATH (CUSTOM PROCEDURE TRAY) ×3 IMPLANT
SHEATH AVANTI 5FR X 11CM (SHEATH) ×3 IMPLANT
SHEATH AVANTI 6FR X 11CM (SHEATH) ×3 IMPLANT
STENT SIERRA 3.00 X 18 MM (Permanent Stent) ×3 IMPLANT
WIRE ASAHI PROWATER 180CM (WIRE) ×6 IMPLANT
WIRE GUIDERIGHT .035X150 (WIRE) ×3 IMPLANT

## 2017-05-29 NOTE — Progress Notes (Signed)
Patient continues to complain of back pain. Now complains of "being smothered", diaphoretic and anxious. MD paged. Versed ordered. Medication administered. Patient now resting comfortably. Dr. Ubaldo Glassing here to see patient.

## 2017-05-29 NOTE — Plan of Care (Signed)
Pt is A&Ox4. VSS . 2L O2 [place] . NSR on monitor. Family at bedside. Pt went for cardiac catheterization this shift.  R groin site CDI. No complaints at this time. Will continue to monitor and report to oncoming RN .  Progressing Education: Knowledge of General Education information will improve 05/29/2017 1515 - Progressing by Aleen Campi, RN Health Behavior/Discharge Planning: Ability to manage health-related needs will improve 05/29/2017 1515 - Progressing by Aleen Campi, RN Clinical Measurements: Ability to maintain clinical measurements within normal limits will improve 05/29/2017 1515 - Progressing by Aleen Campi, RN Will remain free from infection 05/29/2017 1515 - Progressing by Aleen Campi, RN Diagnostic test results will improve 05/29/2017 1515 - Progressing by Aleen Campi, RN Respiratory complications will improve 05/29/2017 1515 - Progressing by Aleen Campi, RN Cardiovascular complication will be avoided 05/29/2017 1515 - Progressing by Aleen Campi, RN Activity: Risk for activity intolerance will decrease 05/29/2017 1515 - Progressing by Aleen Campi, RN Nutrition: Adequate nutrition will be maintained 05/29/2017 1515 - Progressing by Aleen Campi, RN Coping: Level of anxiety will decrease 05/29/2017 1515 - Progressing by Aleen Campi, RN Elimination: Will not experience complications related to bowel motility 05/29/2017 1515 - Progressing by Aleen Campi, RN Will not experience complications related to urinary retention 05/29/2017 1515 - Progressing by Aleen Campi, RN Pain Managment: General experience of comfort will improve 05/29/2017 1515 - Progressing by Aleen Campi, RN Safety: Ability to remain free from injury will improve 05/29/2017 1515 - Progressing by Aleen Campi, RN Skin Integrity: Risk for impaired skin integrity will decrease 05/29/2017 1515 - Progressing by Aleen Campi, RN Education: Understanding of CV disease, CV risk reduction, and  recovery process will improve 05/29/2017 1515 - Progressing by Aleen Campi, RN Activity: Ability to return to baseline activity level will improve 05/29/2017 1515 - Progressing by Aleen Campi, RN Cardiovascular: Ability to achieve and maintain adequate cardiovascular perfusion will improve 05/29/2017 1515 - Progressing by Aleen Campi, RN Vascular access site(s) Level 0-1 will be maintained 05/29/2017 1515 - Progressing by Aleen Campi, RN

## 2017-05-29 NOTE — Progress Notes (Signed)
Patient Name: Thomas [name] Date of Encounter: 05/29/2017  Hospital Problem List     Principal Problem:   Chest pain Active Problems:   DM (diabetes mellitus), type 2 with renal complications (HCC)   Essential (primary) hypertension   Hyperlipidemia   CAD (coronary artery disease) of artery bypass graft   Acute on chronic renal failure (HCC)   NSTEMI (non-ST elevated myocardial infarction) [facility])    Patient Profile     Pt with history of svg to rca with known occluded svg to rca, occluded rca and lcx with 70% lad. EF 35%. Admited with nstemi and renal insuffiency.   Subjective   No further chest pain   Inpatient Medications    . [START ON 05/30/2017] aspirin  81 mg Oral Pre-Cath  . [MAR Hold] aspirin EC  81 mg Oral Daily  . [MAR Hold] atorvastatin  80 mg Oral Daily  . [MAR Hold] insulin aspart  0-5 Units Subcutaneous QHS  . [MAR Hold] insulin aspart  0-9 Units Subcutaneous TID WC  . [MAR Hold] isosorbide mononitrate  60 mg Oral Daily  . [MAR Hold] metoprolol succinate  50 mg Oral Daily  . [MAR Hold] pantoprazole  40 mg Oral Daily  . [MAR Hold] ramelteon  8 mg Oral QHS  . sodium chloride flush  3 mL Intravenous Q12H  . [facility] Hold] sodium chloride flush  3 mL Intravenous Q12H    Vital Signs    Vitals:   05/28/17 1603 05/28/17 1952 05/29/17 0413 05/29/17 0748  BP: 130/65 (!) 106/58 (!) 158/78 (!) 145/79  Pulse: 77 87 92 90  Resp: 16 18 18    Temp: 97.7 F (36.5 C) 97.7 F (36.5 C) (!) 97.4 F (36.3 C) 98 F (36.7 C)  TempSrc: Oral Oral Oral Oral  SpO2: 95% 94% 92% 94%  Weight:   73.8 kg (162 lb 12.8 oz)   Height:        Intake/Output Summary (Last 24 hours) at 05/29/2017 0829 Last data filed at 05/28/2017 1844 Gross per 24 hour  Intake 363 ml  Output 500 ml  Net -137 ml   Filed Weights   05/24/17 2120 05/28/17 0237 05/29/17 0413  Weight: 75.3 kg (166 lb) 76.2 kg (167 lb 14.4 oz) 73.8 kg (162 lb 12.8 oz)    Physical Exam    GEN: Well nourished, well  developed, in no acute distress.  HEENT: normal.  Neck: Supple, no JVD, carotid bruits, or masses. Cardiac: RRR, no murmurs, rubs, or gallops. No clubbing, cyanosis, edema.  Radials/DP/PT 2+ and equal bilaterally.  Respiratory:  Respirations regular and unlabored, clear to auscultation bilaterally. GI: Soft, nontender, nondistended, BS + x 4. MS: no deformity or atrophy. Skin: warm and dry, no rash. Neuro:  Strength and sensation are intact. Psych: Normal affect.  Labs    CBC Recent Labs    05/28/17 0829 05/29/17 0405  WBC 10.9* 12.8*  HGB 9.1* 8.3*  HCT 27.5* 25.0*  MCV 86.9 87.2  PLT 376 818   Basic Metabolic Panel Recent Labs    05/28/17 0829  05/28/17 1759 05/29/17 0405  NA 138  --   --  138  K 5.5*   < > 5.6* 5.1  CL 110  --   --  109  CO2 18*  --   --  18*  GLUCOSE 150*  --   --  132*  BUN 43*  --   --  45*  CREATININE 2.60*  --   --  2.63*  CALCIUM 9.1  --   --  8.7*   < > = values in this interval not displayed.   Liver Function Tests No results for input(s): AST, ALT, ALKPHOS, BILITOT, PROT, ALBUMIN in the last 72 hours. No results for input(s): LIPASE, AMYLASE in the last 72 hours. Cardiac Enzymes No results for input(s): CKTOTAL, CKMB, CKMBINDEX, TROPONINI in the last 72 hours. BNP No results for input(s): BNP in the last 72 hours. D-Dimer No results for input(s): DDIMER in the last 72 hours. Hemoglobin A1C No results for input(s): HGBA1C in the last 72 hours. Fasting Lipid Panel No results for input(s): CHOL, HDL, LDLCALC, TRIG, CHOLHDL, LDLDIRECT in the last 72 hours. Thyroid Function Tests No results for input(s): TSH, T4TOTAL, T3FREE, THYROIDAB in the last 72 hours.  Invalid input(s): FREET3  Telemetry            ECG    nsr   Radiology    Dg Chest 2 View  Result Date: 05/24/2017 CLINICAL DATA:  Left chest pain. EXAM: CHEST  2 VIEW COMPARISON:  None. FINDINGS: Cardiomegaly. The hila and mediastinum are normal. No pulmonary  nodules or masses. No focal infiltrates. No overt edema. IMPRESSION: No active cardiopulmonary disease. Electronically Signed   By: Dorise Bullion III M.D   On: 05/24/2017 21:55   US Renal  Result Date: 05/27/2017 CLINICAL DATA:  Acute renal failure, chronic kidney disease EXAM: RENAL / URINARY TRACT ULTRASOUND COMPLETE COMPARISON:  CT 04/01/2014 FINDINGS: Right Kidney: Length: 9.2 cm. Increased echotexture throughout the right kidney. No mass or hydronephrosis. Left Kidney: Length: 10.9 cm. 2.4 cm upper pole cyst, stable since prior CT. Increased echotexture. No hydronephrosis. Bladder: Appears normal for degree of bladder distention. IMPRESSION: Increased echotexture within the kidneys bilaterally compatible with chronic medical renal disease. No hydronephrosis. Electronically Signed   By: Rolm Baptise M.D.   On: 05/27/2017 15:15      Wall Motion                   Coronary Diagrams   Diagnostic Diagram           Assessment & Plan    72 yo s/p cabg with chest pain and elevated tropnin. Plan for left cardiac cath today with limited contrast to document for any further lad disease progression. Further recs after cath. Renal function stablized.   Signed, Javier Docker Kena Limon MD 05/29/2017, 8:29 AM  Pager: [phone]

## 2017-05-29 NOTE — Progress Notes (Signed)
[facility] NAME: Thomas [name]    MR#:  [id number]  DATE OF BIRTH:  05-27-1945  SUBJECTIVE:   Patient came in with on and off chest pain.   Patient feels a lot better today.  Seen before and after cardiac cath .Resting comfortably REVIEW OF SYSTEMS:   Review of Systems  Constitutional: Negative for chills, fever and weight loss.  HENT: Negative for ear discharge, ear pain and nosebleeds.   Eyes: Negative for blurred vision, pain and discharge.  Respiratory: Negative for sputum production, shortness of breath, wheezing and stridor.   Cardiovascular: Negative for chest pain, palpitations, orthopnea and PND.  Gastrointestinal: Negative for abdominal pain, diarrhea, nausea and vomiting.  Genitourinary: Negative for frequency and urgency.  Musculoskeletal: Negative for back pain and joint pain.  Neurological: Negative for sensory change, speech change, focal weakness and weakness.  Psychiatric/Behavioral: Negative for depression and hallucinations. The patient is not nervous/anxious.    Tolerating Diet: Yes Tolerating PT: Not needed  DRUG ALLERGIES:   Allergies  Allergen Reactions  . Ciprofloxacin Itching and Swelling    Facial swelling     VITALS:  Blood pressure (!) 143/81, pulse 81, temperature 98.2 F (36.8 C), temperature source Oral, resp. rate 18, height 5\' 6"  (1.676 m), weight 73.8 kg (162 lb 12.8 oz), SpO2 98 %.  PHYSICAL EXAMINATION:   Physical Exam  GENERAL:  72 y.o.-year-old patient lying in the bed with no acute distress.  EYES: Pupils equal, round, reactive to light and accommodation. No scleral icterus. Extraocular muscles intact.  HEENT: Head atraumatic, normocephalic. Oropharynx and nasopharynx clear.  NECK:  Supple, no jugular venous distention. No thyroid enlargement, no tenderness.  LUNGS: Normal breath sounds bilaterally, no wheezing, rales, rhonchi. No use of accessory muscles of respiration.   CARDIOVASCULAR: S1, S2 normal. No murmurs, rubs, or gallops.  ABDOMEN: Soft, nontender, nondistended. Bowel sounds present. No organomegaly or mass.  EXTREMITIES: Right groin area with clean dressing no cyanosis, clubbing or edema b/l.    NEUROLOGIC: Cranial nerves II through XII are intact. No focal Motor or sensory deficits b/l.   PSYCHIATRIC:  patient is alert and oriented x 3.  SKIN: No obvious rash, lesion, or ulcer.   LABORATORY PANEL:  CBC Recent Labs  Lab 05/29/17 0405  WBC 12.8*  HGB 8.3*  HCT 25.0*  PLT 391    Chemistries  Recent Labs  Lab 05/29/17 0405  NA 138  K 5.1  CL 109  CO2 18*  GLUCOSE 132*  BUN 45*  CREATININE 2.63*  CALCIUM 8.7*   Cardiac Enzymes Recent Labs  Lab 05/25/17 1248  TROPONINI 0.55*   RADIOLOGY:  US Renal  Result Date: 05/27/2017 CLINICAL DATA:  Acute renal failure, chronic kidney disease EXAM: RENAL / URINARY TRACT ULTRASOUND COMPLETE COMPARISON:  CT 04/01/2014 FINDINGS: Right Kidney: Length: 9.2 cm. Increased echotexture throughout the right kidney. No mass or hydronephrosis. Left Kidney: Length: 10.9 cm. 2.4 cm upper pole cyst, stable since prior CT. Increased echotexture. No hydronephrosis. Bladder: Appears normal for degree of bladder distention. IMPRESSION: Increased echotexture within the kidneys bilaterally compatible with chronic medical renal disease. No hydronephrosis. Electronically Signed   By: Rolm Baptise M.D.   On: 05/27/2017 15:15   ASSESSMENT AND PLAN:  Thomas [name]  is a 72 y.o. male who presents with recurring and increasing episodes of chest pain.  Patient states that he had an episode earlier today which was the most severe that it has  been.  It is central chest pain, pressure-like in nature, radiated to his shoulder blades  1.  Unstable angina/chest pain -with elevated troponin.   -heparin drip started--now on HOLD due to low hgb. No active  bleeding - Troponin  0.35--0.44--0.58--.55 -pt seen by Dr Vickii Penna  recommendations ---  -Cath - Three-vessel coronary artery disease with 90% stenosis mid LAD, occluded proximal left circumflex, occluded proximal RCA, and occluded saphenous vein bypass graft to RCA.Successful PCI, with DES mid LAD, TIMI III pre-and post PCI.  2. CAD (coronary artery disease) of artery bypass graft  -continue home meds  3.  DM (diabetes mellitus), type 2 with renal complications (HCC -sliding scale insulin   4.  Essential (primary) hypertension -continue home meds    5.Acute on chronic renal failure (HCC) -avoid nephrotoxins and monitor -basleine creat 1.6-1.8 -creat 2.66--2.77--2.8-2.45--2.60--2.63 -recieved IVF at low rate -Nephrology is  Following, check BMP in a.m. -Renal ultrasound has revealed chronic kidney disease but no acute findings  6.  Hyperlipidemia -continue home meds  7.  Hyperkalemia  potassium at 5.1 Kayexalate given, rpt pot   Case discussed with Care Management/Social Worker. Management plans discussed with the patient, family and they are in agreement.  CODE STATUS: FULL  DVT Prophylaxis: heparin  TOTAL TIME TAKING CARE OF THIS PATIENT:36 minutes.  >50% time spent on counselling and coordination of care  POSSIBLE D/C IN *1-2* DAYS, DEPENDING ON CLINICAL CONDITION.  Note: This dictation was prepared with Dragon dictation along with smaller phrase technology. Any transcriptional errors that result from this process are unintentional.  Nicholes Mango M.D on 05/29/2017 at 2:23 PM  Between 7am to 6pm - Pager - [phone]  After 6pm go to [email or web address] - password [email or web address] [facility]  Office  [phone]  CC: Primary care physician; Birdie Sons, MDPatient ID: Thomas [name], male   DOB: 02/24/1946, 72 y.o.   MRN: [id number]

## 2017-05-29 NOTE — Progress Notes (Signed)
Patient Name: Thomas [name] Date of Encounter: 05/29/2017  Hospital Problem List     Principal Problem:   Chest pain Active Problems:   DM (diabetes mellitus), type 2 with renal complications (HCC)   Essential (primary) hypertension   Hyperlipidemia   CAD (coronary artery disease) of artery bypass graft   Acute on chronic renal failure (HCC)   NSTEMI (non-ST elevated myocardial infarction) [facility])    Patient Profile    Pt with 3 vd s/p pci of proximal to mid lad.   Subjective   Sedated post cath  Inpatient Medications    . aspirin EC  81 mg Oral Daily  . atorvastatin  80 mg Oral Daily  . insulin aspart  0-5 Units Subcutaneous QHS  . insulin aspart  0-9 Units Subcutaneous TID WC  . isosorbide mononitrate  60 mg Oral Daily  . metoprolol succinate  50 mg Oral Daily  . pantoprazole  40 mg Oral Daily  . ramelteon  8 mg Oral QHS  . sodium chloride flush  3 mL Intravenous Q12H    Vital Signs    Vitals:   05/29/17 1215 05/29/17 1230 05/29/17 1245 05/29/17 1410  BP: (!) 134/94 (!) 133/50 115/74 (!) 143/81  Pulse: 65 75 77 81  Resp: 12 18 16 18   Temp:    98.2 F (36.8 C)  TempSrc:    Oral  SpO2: 96% 95% 95% 98%  Weight:      Height:        Intake/Output Summary (Last 24 hours) at 05/29/2017 1655 Last data filed at 05/29/2017 1500 Gross per 24 hour  Intake 0 ml  Output 300 ml  Net -300 ml   Filed Weights   05/24/17 2120 05/28/17 0237 05/29/17 0413  Weight: 75.3 kg (166 lb) 76.2 kg (167 lb 14.4 oz) 73.8 kg (162 lb 12.8 oz)    Physical Exam    GEN: Well nourished, well developed, in no acute distress.  HEENT: normal.  Neck: Supple, no JVD, carotid bruits, or masses. Cardiac: RRR, no murmurs, rubs, or gallops. No clubbing, cyanosis, edema.  Radials/DP/PT 2+ and equal bilaterally.  Respiratory:  Respirations regular and unlabored, clear to auscultation bilaterally. GI: Soft, nontender, nondistended, BS + x 4. MS: no deformity or atrophy. Skin: warm and dry,  no rash. Neuro:  Strength and sensation are intact. Psych: Normal affect.  Labs    CBC Recent Labs    05/28/17 0829 05/29/17 0405  WBC 10.9* 12.8*  HGB 9.1* 8.3*  HCT 27.5* 25.0*  MCV 86.9 87.2  PLT 376 245   Basic Metabolic Panel Recent Labs    05/28/17 0829  05/28/17 1759 05/29/17 0405  NA 138  --   --  138  K 5.5*   < > 5.6* 5.1  CL 110  --   --  109  CO2 18*  --   --  18*  GLUCOSE 150*  --   --  132*  BUN 43*  --   --  45*  CREATININE 2.60*  --   --  2.63*  CALCIUM 9.1  --   --  8.7*   < > = values in this interval not displayed.   Liver Function Tests No results for input(s): AST, ALT, ALKPHOS, BILITOT, PROT, ALBUMIN in the last 72 hours. No results for input(s): LIPASE, AMYLASE in the last 72 hours. Cardiac Enzymes No results for input(s): CKTOTAL, CKMB, CKMBINDEX, TROPONINI in the last 72 hours. BNP No results for input(s): BNP  in the last 72 hours. D-Dimer No results for input(s): DDIMER in the last 72 hours. Hemoglobin A1C No results for input(s): HGBA1C in the last 72 hours. Fasting Lipid Panel No results for input(s): CHOL, HDL, LDLCALC, TRIG, CHOLHDL, LDLDIRECT in the last 72 hours. Thyroid Function Tests No results for input(s): TSH, T4TOTAL, T3FREE, THYROIDAB in the last 72 hours.  Invalid input(s): FREET3  Telemetry    nsr  ECG    nsr  Radiology    Dg Chest 2 View  Result Date: 05/24/2017 CLINICAL DATA:  Left chest pain. EXAM: CHEST  2 VIEW COMPARISON:  None. FINDINGS: Cardiomegaly. The hila and mediastinum are normal. No pulmonary nodules or masses. No focal infiltrates. No overt edema. IMPRESSION: No active cardiopulmonary disease. Electronically Signed   By: Dorise Bullion III M.D   On: 05/24/2017 21:55   US Renal  Result Date: 05/27/2017 CLINICAL DATA:  Acute renal failure, chronic kidney disease EXAM: RENAL / URINARY TRACT ULTRASOUND COMPLETE COMPARISON:  CT 04/01/2014 FINDINGS: Right Kidney: Length: 9.2 cm. Increased echotexture  throughout the right kidney. No mass or hydronephrosis. Left Kidney: Length: 10.9 cm. 2.4 cm upper pole cyst, stable since prior CT. Increased echotexture. No hydronephrosis. Bladder: Appears normal for degree of bladder distention. IMPRESSION: Increased echotexture within the kidneys bilaterally compatible with chronic medical renal disease. No hydronephrosis. Electronically Signed   By: Rolm Baptise M.D.   On: 05/27/2017 15:15    Assessment & Plan    Pt with occluded rca, occluded lcx and occluded svg to rca s/p pci of lad. CKD. Will continue with asa, plavix metoprolol, high intensity statin and long acting nitrates. Check renal function in am. Consider d/c if stable.   Signed, Javier Docker Saatvik Thielman MD 05/29/2017, 4:55 PM  Pager: [phone]

## 2017-05-29 NOTE — Care Management Important Message (Signed)
Important Message  Patient Details  Name: JOVEN MOM MRN: [id number] Date of Birth: 1945/09/13   Medicare Important Message Given:  Yes    Katrina Stack, RN 05/29/2017, 2:29 PM

## 2017-05-29 NOTE — Progress Notes (Signed)
All pre-cath orders completed.

## 2017-05-30 ENCOUNTER — Telehealth: Payer: Self-pay | Admitting: Family Medicine

## 2017-05-30 LAB — CBC
HEMATOCRIT: 24.5 % — AB (ref 40.0–52.0)
HEMOGLOBIN: 8.1 g/dL — AB (ref 13.0–18.0)
MCH: 28.8 pg (ref 26.0–34.0)
MCHC: 33.1 g/dL (ref 32.0–36.0)
MCV: 86.8 fL (ref 80.0–100.0)
Platelets: 385 10*3/uL (ref 150–440)
RBC: 2.83 MIL/uL — ABNORMAL LOW (ref 4.40–5.90)
RDW: 15 % — AB (ref 11.5–14.5)
WBC: 10.6 10*3/uL (ref 3.8–10.6)

## 2017-05-30 LAB — BASIC METABOLIC PANEL
ANION GAP: 7 (ref 5–15)
BUN: 45 mg/dL — AB (ref 6–20)
CO2: 20 mmol/L — ABNORMAL LOW (ref 22–32)
Calcium: 8.7 mg/dL — ABNORMAL LOW (ref 8.9–10.3)
Chloride: 108 mmol/L (ref 101–111)
Creatinine, Ser: 2.73 mg/dL — ABNORMAL HIGH (ref 0.61–1.24)
GFR calc Af Amer: 25 mL/min — ABNORMAL LOW (ref >60)
GFR, EST NON AFRICAN AMERICAN: 22 mL/min — AB (ref >60)
Glucose, Bld: 136 mg/dL — ABNORMAL HIGH (ref 65–99)
POTASSIUM: 5 mmol/L (ref 3.5–5.1)
SODIUM: 135 mmol/L (ref 135–145)

## 2017-05-30 LAB — GLUCOSE, CAPILLARY: Glucose-Capillary: 140 mg/dL — ABNORMAL HIGH (ref 65–99)

## 2017-05-30 MED ORDER — METOPROLOL SUCCINATE ER 50 MG PO TB24: 50.0000 mg | ORAL_TABLET | Freq: Every day | ORAL | 0 refills | Status: DC

## 2017-05-30 MED ORDER — ISOSORBIDE MONONITRATE ER 60 MG PO TB24: 60.0000 mg | ORAL_TABLET | Freq: Every day | ORAL | 0 refills | Status: DC

## 2017-05-30 MED ORDER — HYDROCODONE-ACETAMINOPHEN 5-325 MG PO TABS: 1.0000 | ORAL_TABLET | Freq: Four times a day (QID) | ORAL | 0 refills | Status: DC | PRN

## 2017-05-30 MED ORDER — AMLODIPINE BESYLATE 5 MG PO TABS: 5.0000 mg | ORAL_TABLET | Freq: Every day | ORAL | 0 refills | Status: DC

## 2017-05-30 MED ORDER — AMLODIPINE BESYLATE 5 MG PO TABS
5.0000 mg | ORAL_TABLET | Freq: Every day | ORAL | Status: DC
  Administered 2017-05-30: 5 mg via ORAL
  Filled 2017-05-30: qty 1

## 2017-05-30 MED ORDER — CLOPIDOGREL BISULFATE 75 MG PO TABS
75.0000 mg | ORAL_TABLET | Freq: Every day | ORAL | Status: DC
  Administered 2017-05-30: 75 mg via ORAL
  Filled 2017-05-30: qty 1

## 2017-05-30 MED ORDER — RAMELTEON 8 MG PO TABS: 8.0000 mg | ORAL_TABLET | Freq: Every day | ORAL | 0 refills | Status: DC

## 2017-05-30 MED ORDER — CLOPIDOGREL BISULFATE 75 MG PO TABS: 75.0000 mg | ORAL_TABLET | Freq: Every day | ORAL | 1 refills | Status: DC

## 2017-05-30 MED ORDER — TERBINAFINE HCL 250 MG PO TABS: 250.0000 mg | ORAL_TABLET | Freq: Every day | ORAL | Status: DC

## 2017-05-30 NOTE — Discharge Summary (Signed)
[facility] NAME: Thomas [name]    MR#:  [id number]  DATE OF BIRTH:  1945/12/21  DATE OF ADMISSION:  05/24/2017 ADMITTING PHYSICIAN: Lance Coon, MD  DATE OF DISCHARGE: 05/30/17  PRIMARY CARE PHYSICIAN: Birdie Sons, MD    ADMISSION DIAGNOSIS:  NSTEMI (non-ST elevated myocardial infarction) ([place]) [I21.4]  DISCHARGE DIAGNOSIS:  Principal Problem:   Chest pain Active Problems:   DM (diabetes mellitus), type 2 with renal complications (HCC)   Essential (primary) hypertension   Hyperlipidemia   CAD (coronary artery disease) of artery bypass graft   Acute on chronic renal failure (HCC)   NSTEMI (non-ST elevated myocardial infarction) ([place])   SECONDARY DIAGNOSIS:   Past Medical History:  Diagnosis Date  . Allergy   . Arthritis   . CAD (coronary artery disease)   . Diabetes mellitus without complication ([place])   . Erosive esophagitis   . GERD (gastroesophageal reflux disease)   . Gouty arthropathy   . History of colonic polyps   . History of kidney stones   . History of MRSA infection   . Hyperkalemia   . Hyperlipidemia   . Hypertension   . Melanoma ([place])   . Myocardial infarction ([place]) 1986  . Peripheral vascular disease ([place])   . 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 Trigger finger of left hand   . Ureteral tumor     HOSPITAL COURSE:  HPI  Thomas [name]  is a 72 y.o. male who presents with recurring and increasing episodes of chest pain.  Patient states that he had an episode earlier today which was the most severe that it has been.  It is central chest pain, pressure-like in nature, radiated to his shoulder blades.  He has a prior history of heart disease with some known blockages.  Here in the ED his troponin was elevated at 0.35.  Heparin drip was started and hospitalist were called for admission  1.  Unstable angina/chest pain -with elevated troponin.  -heparin drip started--now  on HOLD due to low hgb. No active  bleeding -Troponin  0.35--0.44--0.58--.55 -pt seen by Dr Vickii Penna recommendations ---  -Cath -Three-vessel coronary artery disease with 90% stenosis mid LAD, occluded proximal left circumflex, occluded proximal RCA, and occluded saphenous vein bypass graft to RCA.Successful PCI, with DES mid LAD, TIMI III pre-and post PCI. -asa 81 mg daily, plavix 75 mg dialy, imdur 60 mg daily, amlodipine 5 mg daily, metoprolol succinate 50 mg daily,  atorvastatin 80 mg daily and phase 2 outpatient cardiac rehab. Hold ace due to renal instability. 2. CAD (coronary artery disease) of artery bypass graft  -continue  meds  asa 81 mg daily, plavix 75 mg dialy, imdur 60 mg daily, amlodipine 5 mg daily, metoprolol succinate 50 mg daily,  atorvastatin 80 mg daily and phase 2 outpatient cardiac rehab. Hold ace due to renal instability.     3.DM (diabetes mellitus), type 2 with renal complications (HCC -sliding scale insulin   4.Essential (primary) hypertension -continue home meds  5.Acute on chronic renal failure (HCC) -avoid nephrotoxins and monitor -basleine creat 1.6-1.8 -creat 2.66--2.77--2.8-2.45--2.60--2.63-2.73 -recieved IVF at low rate -Outpatient follow-up with nephrology Dr. Candiss Norse okay to discharge patient from nephrology standpoint -Renal ultrasound has revealed chronic kidney disease but no acute findings  6.Hyperlipidemia -continue home meds  7.  Hyperkalemia  potassium at 5.0 after Kayexalate   DISCHARGE CONDITIONS:   Stable   CONSULTS OBTAINED:  Treatment Team:  Teodoro Spray, MD Murlean Iba, MD Isaias Cowman, MD   PROCEDURES cardiac cath  DRUG ALLERGIES:   Allergies  Allergen Reactions  . Ciprofloxacin Itching and Swelling    Facial swelling     DISCHARGE MEDICATIONS:   Allergies as of 05/30/2017      Reactions   Ciprofloxacin Itching, Swelling   Facial swelling       Medication List    STOP taking  these medications   buPROPion 150 MG 12 hr tablet Commonly known as:  WELLBUTRIN SR   colchicine 0.6 MG tablet   lisinopril-hydrochlorothiazide 20-25 MG tablet Commonly known as:  PRINZIDE,ZESTORETIC   metFORMIN 500 MG 24 hr tablet Commonly known as:  GLUCOPHAGE-XR     TAKE these medications   acetaminophen 500 MG tablet Commonly known as:  TYLENOL Take 500 mg by mouth every 8 (eight) hours as needed for mild pain or headache.   amLODipine 5 MG tablet Commonly known as:  NORVASC Take 1 tablet (5 mg total) by mouth daily. Start taking on:  05/31/2017   aspirin EC 81 MG tablet Take 81 mg by mouth daily.   atorvastatin 80 MG tablet Commonly known as:  LIPITOR TAKE ONE TABLET BY MOUTH EVERY DAY   clopidogrel 75 MG tablet Commonly known as:  PLAVIX Take 1 tablet (75 mg total) by mouth daily.   clotrimazole-betamethasone cream Commonly known as:  LOTRISONE Apply 1 application topically 2 (two) times daily.   glipiZIDE 10 MG 24 hr tablet Commonly known as:  GLUCOTROL XL TAKE ONE (1) TABLET BY MOUTH EVERY DAY WITH BREAKFAST   glucose blood test strip   HYDROcodone-acetaminophen 5-325 MG tablet Commonly known as:  NORCO/VICODIN Take 1 tablet by mouth every 6 (six) hours as needed for moderate pain.   isosorbide mononitrate 60 MG 24 hr tablet Commonly known as:  IMDUR Take 1 tablet (60 mg total) by mouth daily. Start taking on:  05/31/2017   metoprolol succinate 50 MG 24 hr tablet Commonly known as:  TOPROL-XL Take 1 tablet (50 mg total) by mouth daily. Take with or immediately following a meal. Start taking on:  05/31/2017 What changed:    medication strength  how much to take  additional instructions   pantoprazole 40 MG tablet Commonly known as:  PROTONIX Take 1 tablet (40 mg total) by mouth daily.   ramelteon 8 MG tablet Commonly known as:  ROZEREM Take 1 tablet (8 mg total) by mouth at bedtime.   terbinafine 250 MG tablet Commonly known as:   LAMISIL Take 1 tablet (250 mg total) by mouth daily. Resume taking in 1 week after seeing the primary care physician if PCP is agreeable What changed:  additional instructions   triamcinolone cream 0.1 % Commonly known as:  KENALOG   Vitamin B-12 5000 MCG Subl Place 5,000 mcg under the tongue daily.        DISCHARGE INSTRUCTIONS:   Follow-up with primary care physician in 1 week Follow-up cardiology Dr. Saralyn Pilar in 5-7 days, cath site care as recommended by them Follow-up with nephrology Dr. Candiss Norse in 1 week Outpatient cardiac rehabilitation phase 2  DIET:  Cardiac diet and Diabetic diet  DISCHARGE CONDITION:  Stable  ACTIVITY:  Activity as tolerated  OXYGEN:  Home Oxygen: No.   Oxygen Delivery: room air  DISCHARGE LOCATION:  home   If you experience worsening of your admission symptoms, develop shortness of breath, life threatening emergency, suicidal or homicidal thoughts you must seek medical attention  immediately by calling 911 or calling your MD immediately  if symptoms less severe.  You Must read complete instructions/literature along with all the possible adverse reactions/side effects for all the Medicines you take and that have been prescribed to you. Take any new Medicines after you have completely understood and accpet all the possible adverse reactions/side effects.   Please note  You were cared for by a hospitalist during your hospital stay. If you have any questions about your discharge medications or the care you received while you were in the hospital after you are discharged, you can call the unit and asked to speak with the hospitalist on call if the hospitalist that took care of you is not available. Once you are discharged, your primary care physician will handle any further medical issues. Please note that NO REFILLS for any discharge medications will be authorized once you are discharged, as it is imperative that you return to your primary care  physician (or establish a relationship with a primary care physician if you do not have one) for your aftercare needs so that they can reassess your need for medications and monitor your lab values.     Today  Chief Complaint  Patient presents with  . Chest Pain   Patient is doing much better.  Denies any complaints.  Wants to go home.  Okay to discharge patient from cardiology and nephrology standpoint  ROS:  CONSTITUTIONAL: Denies fevers, chills. Denies any fatigue, weakness.  EYES: Denies blurry vision, double vision, eye pain. EARS, NOSE, THROAT: Denies tinnitus, ear pain, hearing loss. RESPIRATORY: Denies cough, wheeze, shortness of breath.  CARDIOVASCULAR: Denies chest pain, palpitations, edema.  GASTROINTESTINAL: Denies nausea, vomiting, diarrhea, abdominal pain. Denies bright red blood per rectum. GENITOURINARY: Denies dysuria, hematuria. ENDOCRINE: Denies nocturia or thyroid problems. HEMATOLOGIC AND LYMPHATIC: Denies easy bruising or bleeding. SKIN: Denies rash or lesion. MUSCULOSKELETAL: Denies pain in neck, back, shoulder, knees, hips or arthritic symptoms.  NEUROLOGIC: Denies paralysis, paresthesias.  PSYCHIATRIC: Denies anxiety or depressive symptoms.   VITAL SIGNS:  Blood pressure (!) 152/85, pulse 90, temperature 97.7 F (36.5 C), temperature source Oral, resp. rate 17, height 5\' 6"  (1.676 m), weight 73.8 kg (162 lb 12.8 oz), SpO2 99 %.  I/O:    Intake/Output Summary (Last 24 hours) at 05/30/2017 0909 Last data filed at 05/30/2017 0800 Gross per 24 hour  Intake 360 ml  Output 1300 ml  Net -940 ml    PHYSICAL EXAMINATION:  GENERAL:  72 y.o.-year-old patient lying in the bed with no acute distress.  EYES: Pupils equal, round, reactive to light and accommodation. No scleral icterus. Extraocular muscles intact.  HEENT: Head atraumatic, normocephalic. Oropharynx and nasopharynx clear.  NECK:  Supple, no jugular venous distention. No thyroid enlargement, no  tenderness.  LUNGS: Normal breath sounds bilaterally, no wheezing, rales,rhonchi or crepitation. No use of accessory muscles of respiration.  CARDIOVASCULAR: S1, S2 normal. No murmurs, rubs, or gallops.  ABDOMEN: Soft, non-tender, non-distended. Bowel sounds present. No organomegaly or mass.  EXTREMITIES: Right groin with no hematoma or bleeding no pedal edema, cyanosis, or clubbing.  NEUROLOGIC: Cranial nerves II through XII are intact. Muscle strength 5/5 in all extremities. Sensation intact. Gait not checked.  PSYCHIATRIC: The patient is alert and oriented x 3.  SKIN: No obvious rash, lesion, or ulcer.   DATA REVIEW:   CBC Recent Labs  Lab 05/30/17 0550  WBC 10.6  HGB 8.1*  HCT 24.5*  PLT 385    Chemistries  Recent Labs  Lab 05/30/17 0550  NA 135  K 5.0  CL 108  CO2 20*  GLUCOSE 136*  BUN 45*  CREATININE 2.73*  CALCIUM 8.7*    Cardiac Enzymes Recent Labs  Lab 05/25/17 1248  TROPONINI 0.55*    Microbiology Results  No results found for this or any previous visit.  RADIOLOGY:  US Renal  Result Date: 05/27/2017 CLINICAL DATA:  Acute renal failure, chronic kidney disease EXAM: RENAL / URINARY TRACT ULTRASOUND COMPLETE COMPARISON:  CT 04/01/2014 FINDINGS: Right Kidney: Length: 9.2 cm. Increased echotexture throughout the right kidney. No mass or hydronephrosis. Left Kidney: Length: 10.9 cm. 2.4 cm upper pole cyst, stable since prior CT. Increased echotexture. No hydronephrosis. Bladder: Appears normal for degree of bladder distention. IMPRESSION: Increased echotexture within the kidneys bilaterally compatible with chronic medical renal disease. No hydronephrosis. Electronically Signed   By: Rolm Baptise M.D.   On: 05/27/2017 15:15    EKG:   Orders placed or performed during the hospital encounter of 05/24/17  . ED EKG within 10 minutes  . ED EKG within 10 minutes  . EKG 12-Lead  . EKG 12-Lead  . EKG 12-Lead  . EKG 12-Lead      Management plans discussed  with the patient, family and they are in agreement.  CODE STATUS:     Code Status Orders  (From admission, onward)        Start     Ordered   05/25/17 0025  Full code  Continuous     05/25/17 0024    Code Status History    Date Active Date Inactive Code Status Order ID Comments User Context   11/14/2016 09:38 11/14/2016 14:42 Full Code [id number]  Algernon Huxley, MD Inpatient   10/31/2016 11:21 10/31/2016 18:17 Full Code [id number]  Algernon Huxley, MD Inpatient   10/18/2016 13:57 10/18/2016 18:50 Full Code [id number]  Isaias Cowman, MD Inpatient      TOTAL TIME TAKING CARE OF THIS PATIENT: 43 minutes.   Note: This dictation was prepared with Dragon dictation along with smaller phrase technology. Any transcriptional errors that result from this process are unintentional.   @[facility] @  on 05/30/2017 at 9:09 AM  Between 7am to 6pm - Pager - [phone]  After 6pm go to [email or web address] - password [email or web address]  Office  [phone]  CC: Primary care physician; Birdie Sons, MD

## 2017-05-30 NOTE — Progress Notes (Signed)
Discharge instructions reviewed with pt by Joan Mayans RN. Pt ready for discharge home.

## 2017-05-30 NOTE — Discharge Instructions (Signed)
Follow-up with primary care physician in 1 week Follow-up cardiology Dr. Saralyn Pilar in 5-7 days, cath site care as recommended by them Follow-up with nephrology Dr. Candiss Norse in 1 week Outpatient cardiac rehabilitation phase 2

## 2017-05-30 NOTE — Progress Notes (Signed)
Discharge instructions reviewed with patient and family. Appointments highlighted for patient. IVs removed by [facility] student with no complications. Patient's questions answered. Volunteer services to transport to private vehicle via wheelchair.

## 2017-05-30 NOTE — Progress Notes (Signed)
Patient Name: Thomas [name] Date of Encounter: 05/30/2017  Hospital Problem List     Principal Problem:   Chest pain Active Problems:   DM (diabetes mellitus), type 2 with renal complications (HCC)   Essential (primary) hypertension   Hyperlipidemia   CAD (coronary artery disease) of artery bypass graft   Acute on chronic renal failure (HCC)   NSTEMI (non-ST elevated myocardial infarction) [facility])    Patient Profile     72 yo with ischemic cardiomyopathy with ef of 35%, history of cad s/p cabg in 1996 with svg to rca with occluded rca and occluded svg to rca, chronically occluded lcx and proximal lad disease. S/p pci of lad .  Subjective   Feels great   Inpatient Medications    . amLODipine  5 mg Oral Daily  . aspirin EC  81 mg Oral Daily  . atorvastatin  80 mg Oral Daily  . clopidogrel  75 mg Oral Daily  . insulin aspart  0-5 Units Subcutaneous QHS  . insulin aspart  0-9 Units Subcutaneous TID WC  . isosorbide mononitrate  60 mg Oral Daily  . metoprolol succinate  50 mg Oral Daily  . pantoprazole  40 mg Oral Daily  . ramelteon  8 mg Oral QHS  . sodium chloride flush  3 mL Intravenous Q12H    Vital Signs    Vitals:   05/29/17 1245 05/29/17 1410 05/29/17 1943 05/30/17 0350  BP: 115/74 (!) 143/81 (!) 142/70 (!) 152/85  Pulse: 77 81 83 90  Resp: 16 18 17 17   Temp:  98.2 F (36.8 C) 98.1 F (36.7 C) 97.7 F (36.5 C)  TempSrc:  Oral Oral Oral  SpO2: 95% 98% 96% 99%  Weight:      Height:        Intake/Output Summary (Last 24 hours) at 05/30/2017 0830 Last data filed at 05/30/2017 0800 Gross per 24 hour  Intake 360 ml  Output 1300 ml  Net -940 ml   Filed Weights   05/24/17 2120 05/28/17 0237 05/29/17 0413  Weight: 75.3 kg (166 lb) 76.2 kg (167 lb 14.4 oz) 73.8 kg (162 lb 12.8 oz)    Physical Exam    GEN: Well nourished, well developed, in no acute distress.  HEENT: normal.  Neck: Supple, no JVD, carotid bruits, or masses. Cardiac: RRR, no murmurs,  rubs, or gallops. No clubbing, cyanosis, edema.  Radials/DP/PT 2+ and equal bilaterally.  Respiratory:  Respirations regular and unlabored, clear to auscultation bilaterally. GI: Soft, nontender, nondistended, BS + x 4. MS: no deformity or atrophy. Skin: warm and dry, no rash. Neuro:  Strength and sensation are intact. Psych: Normal affect.  Labs    CBC Recent Labs    05/29/17 0405 05/30/17 0550  WBC 12.8* 10.6  HGB 8.3* 8.1*  HCT 25.0* 24.5*  MCV 87.2 86.8  PLT 391 353   Basic Metabolic Panel Recent Labs    05/29/17 1704 05/30/17 0550  NA 135 135  K 5.1 5.0  CL 106 108  CO2 19* 20*  GLUCOSE 187* 136*  BUN 45* 45*  CREATININE 2.66* 2.73*  CALCIUM 8.6* 8.7*   Liver Function Tests No results for input(s): AST, ALT, ALKPHOS, BILITOT, PROT, ALBUMIN in the last 72 hours. No results for input(s): LIPASE, AMYLASE in the last 72 hours. Cardiac Enzymes No results for input(s): CKTOTAL, CKMB, CKMBINDEX, TROPONINI in the last 72 hours. BNP No results for input(s): BNP in the last 72 hours. D-Dimer No results for input(s):  DDIMER in the last 72 hours. Hemoglobin A1C No results for input(s): HGBA1C in the last 72 hours. Fasting Lipid Panel No results for input(s): CHOL, HDL, LDLCALC, TRIG, CHOLHDL, LDLDIRECT in the last 72 hours. Thyroid Function Tests No results for input(s): TSH, T4TOTAL, T3FREE, THYROIDAB in the last 72 hours.  Invalid input(s): FREET3  Telemetry    nsr  ECG    nsr  Radiology    Dg Chest 2 View  Result Date: 05/24/2017 CLINICAL DATA:  Left chest pain. EXAM: CHEST  2 VIEW COMPARISON:  None. FINDINGS: Cardiomegaly. The hila and mediastinum are normal. No pulmonary nodules or masses. No focal infiltrates. No overt edema. IMPRESSION: No active cardiopulmonary disease. Electronically Signed   By: Dorise Bullion III M.D   On: 05/24/2017 21:55   US Renal  Result Date: 05/27/2017 CLINICAL DATA:  Acute renal failure, chronic kidney disease EXAM: RENAL  / URINARY TRACT ULTRASOUND COMPLETE COMPARISON:  CT 04/01/2014 FINDINGS: Right Kidney: Length: 9.2 cm. Increased echotexture throughout the right kidney. No mass or hydronephrosis. Left Kidney: Length: 10.9 cm. 2.4 cm upper pole cyst, stable since prior CT. Increased echotexture. No hydronephrosis. Bladder: Appears normal for degree of bladder distention. IMPRESSION: Increased echotexture within the kidneys bilaterally compatible with chronic medical renal disease. No hydronephrosis. Electronically Signed   By: Rolm Baptise M.D.   On: 05/27/2017 15:15    Assessment & Plan    CAD-s/p pci of 90% proximal lad with a 3.00 x 18 mm Sierra DES. Cath site is without hematoma or bruit. Distal pulses are intact. Creatinine is  2.73 post procedure and Hgb is 8.1 with no sites of bleeding. Received hydration prior to procedure.  Will ambulate and consider discharge if stable on asa 81 mg daily, plavix 75 mg dialy, imdur 60 mg daily, amlodipine 5 mg daily, metoprolol succinate 50 mg daily,  atorvastatin 80 mg daily and phase 2 outpatient cardiac rehab. Hold ace due to renal instability.   CKDIII-IV-avoid nephrotoxic drugs. Will need consideration for outpatient nephrology follow up.   Signed, Javier Docker Tabbetha Kutscher MD 05/30/2017, 8:30 AM  Pager: [phone]

## 2017-05-30 NOTE — Plan of Care (Signed)
Right groin site without bleed or hematoma.  PPP.  No complaints of pain.

## 2017-05-30 NOTE — Telephone Encounter (Signed)
Patient is being D/C from [facility] on 05/30/2017 for chest pain.  He has a appointment for a HFU with Dr. Caryn Section on 06/07/2017 at 4pm.

## 2017-05-31 NOTE — Telephone Encounter (Signed)
Transition Care Management Follow-Up Telephone Call   Date discharged and where: [facility] on 05/24/17.  How have you been since you were released from the hospital? Doing fine, some weakness due to inactivity. Denies any new or worsening s/s.  Any patient concerns? None  Items Reviewed:   Meds: verified  Allergies: verified  Dietary Changes Reviewed: low sodium, heart healthy  Functional Questionnaire:  Independent-I Dependent-D  ADLs:   Dressing- I    Eating- I   Maintaining continence- I   Transferring- I   Transportation- I   Meal Prep- I   Managing Meds- I  Confirmed importance and Date/Time of follow-up visits scheduled: 06/07/17 @ 4:00 PM  Confirmed with patient if condition worsens to call PCP or go to the Emergency Dept. Patient was given office number and encouraged to call back with questions or concerns: YES

## 2017-06-05 SURGERY — LEFT HEART CATH AND CORONARY ANGIOGRAPHY
Anesthesia: Moderate Sedation

## 2017-06-06 DIAGNOSIS — R600 Localized edema: Secondary | ICD-10-CM | POA: Diagnosis not present

## 2017-06-06 DIAGNOSIS — I10 Essential (primary) hypertension: Secondary | ICD-10-CM | POA: Diagnosis not present

## 2017-06-06 DIAGNOSIS — R0602 Shortness of breath: Secondary | ICD-10-CM | POA: Diagnosis not present

## 2017-06-06 DIAGNOSIS — Z9889 Other specified postprocedural states: Secondary | ICD-10-CM | POA: Diagnosis not present

## 2017-06-06 DIAGNOSIS — I25810 Atherosclerosis of coronary artery bypass graft(s) without angina pectoris: Secondary | ICD-10-CM | POA: Diagnosis not present

## 2017-06-07 ENCOUNTER — Ambulatory Visit (INDEPENDENT_AMBULATORY_CARE_PROVIDER_SITE_OTHER): Payer: Medicare Other | Admitting: Family Medicine

## 2017-06-07 ENCOUNTER — Encounter: Payer: Self-pay | Admitting: Family Medicine

## 2017-06-07 ENCOUNTER — Other Ambulatory Visit: Payer: Self-pay

## 2017-06-07 VITALS — BP 150/78 | HR 73 | Temp 97.9°F | Resp 16 | Ht 69.0 in | Wt 175.0 lb

## 2017-06-07 DIAGNOSIS — I519 Heart disease, unspecified: Secondary | ICD-10-CM | POA: Diagnosis not present

## 2017-06-07 DIAGNOSIS — R609 Edema, unspecified: Secondary | ICD-10-CM | POA: Diagnosis not present

## 2017-06-07 DIAGNOSIS — Z87891 Personal history of nicotine dependence: Secondary | ICD-10-CM | POA: Diagnosis not present

## 2017-06-07 DIAGNOSIS — R0609 Other forms of dyspnea: Secondary | ICD-10-CM | POA: Diagnosis not present

## 2017-06-07 DIAGNOSIS — I214 Non-ST elevation (NSTEMI) myocardial infarction: Secondary | ICD-10-CM | POA: Diagnosis not present

## 2017-06-07 MED ORDER — FUROSEMIDE 20 MG PO TABS: 20.0000 mg | ORAL_TABLET | Freq: Every day | ORAL | 2 refills | Status: DC

## 2017-06-07 MED ORDER — BUPROPION HCL ER (SR) 150 MG PO TB12: ORAL_TABLET | ORAL | 5 refills | Status: DC

## 2017-06-07 NOTE — Progress Notes (Signed)
Patient: Thomas [name] Male    DOB: 1946/02/02   72 y.o.   MRN: [id number] Visit Date: 06/07/2017  Today's Provider: Lelon Huh, MD   Chief Complaint  Patient presents with  . Hospitalization Follow-up   Subjective:    HPI   Follow up Hospitalization  Patient was admitted to [facility] on 05/24/2017 and discharged on 05/30/2017. He was treated for NSTEMI Treatment for this included; heparin drip started--now on HOLD due to low hgb. Cath revealed-Three-vessel coronary artery disease with 90% stenosis mid LAD, occluded proximal left circumflex, occluded proximal RCA, and occluded saphenous vein bypass graft to RCA.PCI by Dr. Idelle Leech, with DES mid LAD, TIMI III pre-and post PCI   Was taken off lisinopril-hctz due to acute kidney injury and scheduled to follow up with Dr. Candiss Norse next week. Started on metoprolol and Imdur which he is tolerating well.  Reports he has had no chest pains since discharge, but has been increasing short of breath with exertion and when lying down   Telephone follow up was done on 05/30/2017. He reports good compliance with treatment. He reports this condition is Improved.  ----------------------------------------------------------------  Patient states he is feeling better since hospital visit. However he is still having shortness of breath.   Wt Readings from Last 5 Encounters:  06/07/17 175 lb (79.4 kg)  05/29/17 162 lb 12.8 oz (73.8 kg)  03/21/17 164 lb (74.4 kg)  01/31/17 168 lb (76.2 kg)  12/27/16 167 lb (75.8 kg)      Allergies  Allergen Reactions  . Ciprofloxacin Itching and Swelling    Facial swelling      Current Outpatient Medications:  .  acetaminophen (TYLENOL) 500 MG tablet, Take 500 mg by mouth every 8 (eight) hours as needed for mild pain or headache., Disp: , Rfl:  .  amLODipine (NORVASC) 5 MG tablet, Take 1 tablet (5 mg total) by mouth daily., Disp: 30 tablet, Rfl: 0 .  aspirin EC 81 MG tablet, Take 81 mg by mouth  daily., Disp: , Rfl:  .  atorvastatin (LIPITOR) 80 MG tablet, TAKE ONE TABLET BY MOUTH EVERY DAY, Disp: 30 tablet, Rfl: 5 .  b complex vitamins capsule, Take 1 capsule by mouth daily., Disp: , Rfl:  .  clopidogrel (PLAVIX) 75 MG tablet, Take 1 tablet (75 mg total) by mouth daily., Disp: 30 tablet, Rfl: 1 .  glipiZIDE (GLUCOTROL XL) 10 MG 24 hr tablet, TAKE ONE (1) TABLET BY MOUTH EVERY DAY WITH BREAKFAST, Disp: 30 tablet, Rfl: 12 .  glucose blood test strip, , Disp: , Rfl:  .  hydrocortisone cream 1 %, Apply 1 application topically as needed for itching., Disp: , Rfl:  .  isosorbide mononitrate (IMDUR) 60 MG 24 hr tablet, Take 1 tablet (60 mg total) by mouth daily., Disp: 30 tablet, Rfl: 0 .  metoprolol succinate (TOPROL-XL) 50 MG 24 hr tablet, Take 1 tablet (50 mg total) by mouth daily. Take with or immediately following a meal., Disp: 30 tablet, Rfl: 0 .  pantoprazole (PROTONIX) 40 MG tablet, Take 1 tablet (40 mg total) by mouth daily., Disp: 30 tablet, Rfl: 12 .  HYDROcodone-acetaminophen (NORCO/VICODIN) 5-325 MG tablet, Take 1 tablet by mouth every 6 (six) hours as needed for moderate pain. (Patient not taking: Reported on 06/07/2017), Disp: 20 tablet, Rfl: 0  Review of Systems  Constitutional: Negative for appetite change, chills and fever.  Respiratory: Positive for shortness of breath. Negative for chest tightness and wheezing.   Cardiovascular:  Negative for chest pain and palpitations.  Gastrointestinal: Negative for abdominal pain, nausea and vomiting.    Social History   Tobacco Use  . Smoking status: Current Every Day Smoker    Packs/day: 1.00    Years: 51.00    Pack years: 51.00    Types: Cigarettes  . Smokeless tobacco: Never Used  Substance Use Topics  . Alcohol use: Yes    Alcohol/week: 0.0 oz    Comment:  2 beers a month   Objective:   BP (!) 150/78 (BP Location: Right Arm, Patient Position: Sitting, Cuff Size: Normal)   Pulse 73   Temp 97.9 F (36.6 C) (Oral)    Resp 16   Ht 5\' 9"  (1.753 m)   Wt 175 lb (79.4 kg)   SpO2 98%   BMI 25.84 kg/m  Vitals:   06/07/17 1602  BP: (!) 150/78  Pulse: 73  Resp: 16  Temp: 97.9 F (36.6 C)  TempSrc: Oral  SpO2: 98%  Weight: 175 lb (79.4 kg)  Height: 5\' 9"  (1.753 m)     Physical Exam   General Appearance:    Alert, cooperative, no distress  Eyes:    PERRL, conjunctiva/corneas clear, EOM's intact       Lungs:     Clear to auscultation bilaterally, respirations unlabored  Heart:    Regular rate and rhythm, II/VI SM. 2+ bipedal and ankle edema.   Neurologic:   Awake, alert, oriented x 3. No apparent focal neurological           defect.            Assessment & Plan:     1. NSTEMI (non-ST elevated myocardial infarction) ([place]) No chest pain since recent PCI. Continue current medications.  Continue regular cardiology follow up.   2. Dyspnea on exertion   3. Edema, unspecified type   4. Systolic dysfunction He is likely decompensated a bit since having to stop lisinopril-hctz due to acute kidney injury. He has follow up with renal next and will likely be getting kidney functions and electrolytes checked. In the meantime with cautiously diurese due to symptoms.   5. History of smoking 30 or more pack years He was previously given prescription but not fill- buPROPion (WELLBUTRIN SR) 150 MG 12 hr tablet; 1 tablet daily for 3 days, then 1 tablet twice daily. Stop smoking 14 days after starting medication  Dispense: 60 tablet; Refill: 5  Which was re-written today  He already has follow up scheduled the first week of march and will reassess systolic dysfunction at that time.       Lelon Huh, MD  [facility]

## 2017-06-09 ENCOUNTER — Telehealth: Payer: Self-pay | Admitting: Family Medicine

## 2017-06-09 NOTE — Telephone Encounter (Signed)
Please advise 

## 2017-06-09 NOTE — Telephone Encounter (Signed)
It usually takes several days for it to help with breathing. Should continue same dose of now. Might be able to increase dose if his kidney functions are stable when he sees nephrologist next week, but give current dose a few more days. Should elevated head with pillow when sleeping until symptoms improve

## 2017-06-09 NOTE — Telephone Encounter (Signed)
Pt stated that he has been taking furosemide (LASIX) 20 MG tablet as directed and he has noticed that it has helped with the swelling in his legs and feet but when lays down at night to sleep he has trouble breathing. Pt stated he will wake up about an hour later with difficulty breathing. Please advise. Thanks TNP

## 2017-06-10 ENCOUNTER — Inpatient Hospital Stay
Admission: EM | Admit: 2017-06-10 | Discharge: 2017-06-14 | DRG: 291 | Disposition: A | Discharge status: Home / Self Care | Payer: Medicare Other | Attending: Internal Medicine | Admitting: Internal Medicine

## 2017-06-10 ENCOUNTER — Encounter: Payer: Self-pay | Admitting: Emergency Medicine

## 2017-06-10 ENCOUNTER — Emergency Department: Payer: Medicare Other

## 2017-06-10 ENCOUNTER — Other Ambulatory Visit: Payer: Self-pay

## 2017-06-10 DIAGNOSIS — Z7982 Long term (current) use of aspirin: Secondary | ICD-10-CM

## 2017-06-10 DIAGNOSIS — I129 Hypertensive chronic kidney disease with stage 1 through stage 4 chronic kidney disease, or unspecified chronic kidney disease: Secondary | ICD-10-CM | POA: Diagnosis not present

## 2017-06-10 DIAGNOSIS — I252 Old myocardial infarction: Secondary | ICD-10-CM | POA: Diagnosis not present

## 2017-06-10 DIAGNOSIS — E1122 Type 2 diabetes mellitus with diabetic chronic kidney disease: Secondary | ICD-10-CM | POA: Diagnosis not present

## 2017-06-10 DIAGNOSIS — E119 Type 2 diabetes mellitus without complications: Secondary | ICD-10-CM | POA: Diagnosis not present

## 2017-06-10 DIAGNOSIS — E1151 Type 2 diabetes mellitus with diabetic peripheral angiopathy without gangrene: Secondary | ICD-10-CM | POA: Diagnosis present

## 2017-06-10 DIAGNOSIS — I5022 Chronic systolic (congestive) heart failure: Secondary | ICD-10-CM

## 2017-06-10 DIAGNOSIS — Z881 Allergy status to other antibiotic agents status: Secondary | ICD-10-CM

## 2017-06-10 DIAGNOSIS — I10 Essential (primary) hypertension: Secondary | ICD-10-CM | POA: Diagnosis not present

## 2017-06-10 DIAGNOSIS — Z955 Presence of coronary angioplasty implant and graft: Secondary | ICD-10-CM | POA: Diagnosis not present

## 2017-06-10 DIAGNOSIS — K219 Gastro-esophageal reflux disease without esophagitis: Secondary | ICD-10-CM | POA: Diagnosis present

## 2017-06-10 DIAGNOSIS — I5023 Acute on chronic systolic (congestive) heart failure: Secondary | ICD-10-CM | POA: Diagnosis present

## 2017-06-10 DIAGNOSIS — I2510 Atherosclerotic heart disease of native coronary artery without angina pectoris: Secondary | ICD-10-CM | POA: Diagnosis present

## 2017-06-10 DIAGNOSIS — I509 Heart failure, unspecified: Secondary | ICD-10-CM | POA: Diagnosis not present

## 2017-06-10 DIAGNOSIS — I130 Hypertensive heart and chronic kidney disease with heart failure and stage 1 through stage 4 chronic kidney disease, or unspecified chronic kidney disease: Principal | ICD-10-CM | POA: Diagnosis present

## 2017-06-10 DIAGNOSIS — Z7984 Long term (current) use of oral hypoglycemic drugs: Secondary | ICD-10-CM | POA: Diagnosis not present

## 2017-06-10 DIAGNOSIS — D638 Anemia in other chronic diseases classified elsewhere: Secondary | ICD-10-CM | POA: Diagnosis present

## 2017-06-10 DIAGNOSIS — Z7902 Long term (current) use of antithrombotics/antiplatelets: Secondary | ICD-10-CM | POA: Diagnosis not present

## 2017-06-10 DIAGNOSIS — E875 Hyperkalemia: Secondary | ICD-10-CM | POA: Diagnosis not present

## 2017-06-10 DIAGNOSIS — N184 Chronic kidney disease, stage 4 (severe): Secondary | ICD-10-CM | POA: Diagnosis not present

## 2017-06-10 DIAGNOSIS — E785 Hyperlipidemia, unspecified: Secondary | ICD-10-CM | POA: Diagnosis not present

## 2017-06-10 DIAGNOSIS — I248 Other forms of acute ischemic heart disease: Secondary | ICD-10-CM | POA: Diagnosis present

## 2017-06-10 DIAGNOSIS — N183 Chronic kidney disease, stage 3 (moderate): Secondary | ICD-10-CM | POA: Diagnosis present

## 2017-06-10 DIAGNOSIS — E871 Hypo-osmolality and hyponatremia: Secondary | ICD-10-CM | POA: Diagnosis not present

## 2017-06-10 DIAGNOSIS — R079 Chest pain, unspecified: Secondary | ICD-10-CM | POA: Diagnosis not present

## 2017-06-10 DIAGNOSIS — R0602 Shortness of breath: Secondary | ICD-10-CM | POA: Diagnosis not present

## 2017-06-10 DIAGNOSIS — N179 Acute kidney failure, unspecified: Secondary | ICD-10-CM | POA: Diagnosis present

## 2017-06-10 DIAGNOSIS — I255 Ischemic cardiomyopathy: Secondary | ICD-10-CM | POA: Diagnosis not present

## 2017-06-10 DIAGNOSIS — F17210 Nicotine dependence, cigarettes, uncomplicated: Secondary | ICD-10-CM | POA: Diagnosis present

## 2017-06-10 DIAGNOSIS — E1129 Type 2 diabetes mellitus with other diabetic kidney complication: Secondary | ICD-10-CM | POA: Diagnosis not present

## 2017-06-10 DIAGNOSIS — I110 Hypertensive heart disease with heart failure: Secondary | ICD-10-CM | POA: Diagnosis not present

## 2017-06-10 DIAGNOSIS — R600 Localized edema: Secondary | ICD-10-CM | POA: Diagnosis not present

## 2017-06-10 DIAGNOSIS — R809 Proteinuria, unspecified: Secondary | ICD-10-CM | POA: Diagnosis not present

## 2017-06-10 DIAGNOSIS — I5020 Unspecified systolic (congestive) heart failure: Secondary | ICD-10-CM

## 2017-06-10 DIAGNOSIS — I25810 Atherosclerosis of coronary artery bypass graft(s) without angina pectoris: Secondary | ICD-10-CM | POA: Diagnosis present

## 2017-06-10 LAB — COMPREHENSIVE METABOLIC PANEL
ALT: 18 U/L (ref 17–63)
AST: 21 U/L (ref 15–41)
Albumin: 2.7 g/dL — ABNORMAL LOW (ref 3.5–5.0)
Alkaline Phosphatase: 101 U/L (ref 38–126)
Anion gap: 10 (ref 5–15)
BUN: 43 mg/dL — ABNORMAL HIGH (ref 6–20)
CHLORIDE: 109 mmol/L (ref 101–111)
CO2: 17 mmol/L — ABNORMAL LOW (ref 22–32)
CREATININE: 2.91 mg/dL — AB (ref 0.61–1.24)
Calcium: 8.6 mg/dL — ABNORMAL LOW (ref 8.9–10.3)
GFR calc Af Amer: 23 mL/min — ABNORMAL LOW (ref >60)
GFR, EST NON AFRICAN AMERICAN: 20 mL/min — AB (ref >60)
Glucose, Bld: 178 mg/dL — ABNORMAL HIGH (ref 65–99)
POTASSIUM: 5.3 mmol/L — AB (ref 3.5–5.1)
Sodium: 136 mmol/L (ref 135–145)
Total Bilirubin: 0.4 mg/dL (ref 0.3–1.2)
Total Protein: 6.1 g/dL — ABNORMAL LOW (ref 6.5–8.1)

## 2017-06-10 LAB — GLUCOSE, CAPILLARY
Glucose-Capillary: 139 mg/dL — ABNORMAL HIGH (ref 65–99)
Glucose-Capillary: 132 mg/dL — ABNORMAL HIGH (ref 65–99)

## 2017-06-10 LAB — CBC
HCT: 25.8 % — ABNORMAL LOW (ref 40.0–52.0)
HEMATOCRIT: 24.9 % — AB (ref 40.0–52.0)
HEMOGLOBIN: 8.1 g/dL — AB (ref 13.0–18.0)
HEMOGLOBIN: 8.3 g/dL — AB (ref 13.0–18.0)
MCH: 28.5 pg (ref 26.0–34.0)
MCH: 28.3 pg (ref 26.0–34.0)
MCHC: 32.4 g/dL (ref 32.0–36.0)
MCHC: 32.3 g/dL (ref 32.0–36.0)
MCV: 88.1 fL (ref 80.0–100.0)
MCV: 87.6 fL (ref 80.0–100.0)
PLATELETS: 557 10*3/uL — AB (ref 150–440)
Platelets: 518 10*3/uL — ABNORMAL HIGH (ref 150–440)
RBC: 2.83 MIL/uL — AB (ref 4.40–5.90)
RBC: 2.94 MIL/uL — AB (ref 4.40–5.90)
RDW: 16 % — ABNORMAL HIGH (ref 11.5–14.5)
RDW: 15.7 % — ABNORMAL HIGH (ref 11.5–14.5)
WBC: 10.1 10*3/uL (ref 3.8–10.6)
WBC: 10.8 10*3/uL — AB (ref 3.8–10.6)

## 2017-06-10 LAB — BRAIN NATRIURETIC PEPTIDE: B NATRIURETIC PEPTIDE 5: 2312 pg/mL — AB (ref 0.0–100.0)

## 2017-06-10 LAB — CREATININE, SERUM
CREATININE: 2.86 mg/dL — AB (ref 0.61–1.24)
GFR calc non Af Amer: 20 mL/min — ABNORMAL LOW (ref >60)
GFR, EST AFRICAN AMERICAN: 24 mL/min — AB (ref >60)

## 2017-06-10 LAB — TROPONIN I
TROPONIN I: 0.04 ng/mL — AB (ref <0.03)
Troponin I: 0.04 ng/mL (ref <0.03)
Troponin I: 0.04 ng/mL (ref <0.03)

## 2017-06-10 LAB — LIPASE, BLOOD: LIPASE: 41 U/L (ref 11–51)

## 2017-06-10 MED ORDER — METOPROLOL SUCCINATE ER 50 MG PO TB24
50.0000 mg | ORAL_TABLET | Freq: Every day | ORAL | Status: DC
  Administered 2017-06-11 – 2017-06-14 (×4): 50 mg via ORAL
  Filled 2017-06-10 (×4): qty 1

## 2017-06-10 MED ORDER — SODIUM CHLORIDE 0.9% FLUSH
3.0000 mL | Freq: Two times a day (BID) | INTRAVENOUS | Status: DC
  Administered 2017-06-10 – 2017-06-14 (×9): 3 mL via INTRAVENOUS

## 2017-06-10 MED ORDER — CLOPIDOGREL BISULFATE 75 MG PO TABS
75.0000 mg | ORAL_TABLET | Freq: Every day | ORAL | Status: DC
  Administered 2017-06-11 – 2017-06-14 (×4): 75 mg via ORAL
  Filled 2017-06-10 (×4): qty 1

## 2017-06-10 MED ORDER — ATORVASTATIN CALCIUM 20 MG PO TABS
80.0000 mg | ORAL_TABLET | Freq: Every day | ORAL | Status: DC
  Administered 2017-06-10 – 2017-06-14 (×5): 80 mg via ORAL
  Filled 2017-06-10 (×5): qty 4

## 2017-06-10 MED ORDER — B COMPLEX-C PO TABS
1.0000 | ORAL_TABLET | Freq: Every day | ORAL | Status: DC
  Administered 2017-06-11 – 2017-06-14 (×4): 1 via ORAL
  Filled 2017-06-10 (×5): qty 1

## 2017-06-10 MED ORDER — HYDRALAZINE HCL 25 MG PO TABS
25.0000 mg | ORAL_TABLET | Freq: Three times a day (TID) | ORAL | Status: DC
  Administered 2017-06-10 – 2017-06-14 (×11): 25 mg via ORAL
  Filled 2017-06-10 (×11): qty 1

## 2017-06-10 MED ORDER — BUPROPION HCL ER (SR) 150 MG PO TB12
150.0000 mg | ORAL_TABLET | Freq: Every day | ORAL | Status: DC
  Administered 2017-06-11 – 2017-06-13 (×3): 150 mg via ORAL
  Filled 2017-06-10 (×4): qty 1

## 2017-06-10 MED ORDER — DIPHENHYDRAMINE HCL 25 MG PO CAPS
25.0000 mg | ORAL_CAPSULE | Freq: Every evening | ORAL | Status: DC | PRN
  Administered 2017-06-11 (×2): 25 mg via ORAL
  Filled 2017-06-10 (×2): qty 1

## 2017-06-10 MED ORDER — ONDANSETRON HCL 4 MG/2ML IJ SOLN
INTRAMUSCULAR | Status: AC
  Administered 2017-06-10: 4 mg via INTRAVENOUS
  Filled 2017-06-10: qty 2

## 2017-06-10 MED ORDER — FUROSEMIDE 10 MG/ML IJ SOLN
40.0000 mg | Freq: Once | INTRAMUSCULAR | Status: AC
  Administered 2017-06-10: 40 mg via INTRAVENOUS
  Filled 2017-06-10: qty 4

## 2017-06-10 MED ORDER — ACETAMINOPHEN 650 MG RE SUPP: 650.0000 mg | Freq: Four times a day (QID) | RECTAL | Status: DC | PRN

## 2017-06-10 MED ORDER — FUROSEMIDE 10 MG/ML IJ SOLN
40.0000 mg | Freq: Two times a day (BID) | INTRAMUSCULAR | Status: DC
  Administered 2017-06-10 – 2017-06-12 (×4): 40 mg via INTRAVENOUS
  Filled 2017-06-10 (×4): qty 4

## 2017-06-10 MED ORDER — INSULIN ASPART 100 UNIT/ML ~~LOC~~ SOLN
0.0000 [IU] | Freq: Three times a day (TID) | SUBCUTANEOUS | Status: DC
  Administered 2017-06-10: 1 [IU] via SUBCUTANEOUS
  Administered 2017-06-11: 3 [IU] via SUBCUTANEOUS
  Administered 2017-06-11: 1 [IU] via SUBCUTANEOUS
  Administered 2017-06-12 (×2): 2 [IU] via SUBCUTANEOUS
  Administered 2017-06-12: 1 [IU] via SUBCUTANEOUS
  Administered 2017-06-13: 2 [IU] via SUBCUTANEOUS
  Administered 2017-06-13 (×2): 1 [IU] via SUBCUTANEOUS
  Filled 2017-06-10 (×9): qty 1

## 2017-06-10 MED ORDER — ONDANSETRON HCL 4 MG/2ML IJ SOLN: 4.0000 mg | Freq: Four times a day (QID) | INTRAMUSCULAR | Status: DC | PRN

## 2017-06-10 MED ORDER — ISOSORBIDE MONONITRATE ER 60 MG PO TB24
60.0000 mg | ORAL_TABLET | Freq: Every day | ORAL | Status: DC
  Administered 2017-06-11 – 2017-06-14 (×4): 60 mg via ORAL
  Filled 2017-06-10 (×4): qty 1

## 2017-06-10 MED ORDER — ASPIRIN EC 81 MG PO TBEC
81.0000 mg | DELAYED_RELEASE_TABLET | Freq: Every day | ORAL | Status: DC
  Administered 2017-06-11 – 2017-06-14 (×4): 81 mg via ORAL
  Filled 2017-06-10 (×4): qty 1

## 2017-06-10 MED ORDER — ONDANSETRON HCL 4 MG PO TABS: 4.0000 mg | ORAL_TABLET | Freq: Four times a day (QID) | ORAL | Status: DC | PRN

## 2017-06-10 MED ORDER — HEPARIN SODIUM (PORCINE) 5000 UNIT/ML IJ SOLN
5000.0000 [IU] | Freq: Three times a day (TID) | INTRAMUSCULAR | Status: DC
  Administered 2017-06-10 – 2017-06-14 (×12): 5000 [IU] via SUBCUTANEOUS
  Filled 2017-06-10 (×11): qty 1

## 2017-06-10 MED ORDER — ONDANSETRON HCL 4 MG/2ML IJ SOLN
4.0000 mg | Freq: Once | INTRAMUSCULAR | Status: AC
  Administered 2017-06-10: 4 mg via INTRAVENOUS

## 2017-06-10 MED ORDER — INSULIN ASPART 100 UNIT/ML ~~LOC~~ SOLN: 0.0000 [IU] | Freq: Every day | SUBCUTANEOUS | Status: DC

## 2017-06-10 MED ORDER — PANTOPRAZOLE SODIUM 40 MG PO TBEC
40.0000 mg | DELAYED_RELEASE_TABLET | Freq: Every day | ORAL | Status: DC
  Administered 2017-06-11 – 2017-06-14 (×4): 40 mg via ORAL
  Filled 2017-06-10 (×4): qty 1

## 2017-06-10 MED ORDER — ACETAMINOPHEN 325 MG PO TABS
650.0000 mg | ORAL_TABLET | Freq: Four times a day (QID) | ORAL | Status: DC | PRN
  Administered 2017-06-10: 650 mg via ORAL
  Filled 2017-06-10: qty 2

## 2017-06-10 MED ORDER — AMLODIPINE BESYLATE 5 MG PO TABS
5.0000 mg | ORAL_TABLET | Freq: Every day | ORAL | Status: DC
  Administered 2017-06-11 – 2017-06-12 (×2): 5 mg via ORAL
  Filled 2017-06-10 (×2): qty 1

## 2017-06-10 NOTE — ED Notes (Signed)
Date and time results received: 06/10/17 1345 (use smartphrase ".now" to insert current time)  Test: tropnin Critical Value: 0.04  Name of Provider Notified: schaevitz

## 2017-06-10 NOTE — ED Notes (Signed)
Patient transported to X-ray 

## 2017-06-10 NOTE — ED Notes (Signed)
Family at bedside. Updated on plan of care.

## 2017-06-10 NOTE — Consult Note (Signed)
Reason for Consult: Congestive heart failure cardiomyopathy shortness of breath Referring Physician: Dr. Verdell Carmine hospitalist Dr. Lelon Huh primary Cardiologist Dr. Duayne Cal Thomas [name] is an 72 y.o. male.  HPI: Patient is 72 year old white male known coronary disease known cardiomyopathy ischemic in nature recently underwent PCI and stent with DES to LAD after a non-STEMI May 29, 2017.  Patient complains over the last 4 days of worsening dyspnea shortness of breath dyspnea leg edema.  Patient complains of PND orthopnea complains of chest fullness not able to get a breath.  Denies any fever chills or sweats recently had ACE inhibitor discontinued because of renal insufficiency.  Patient's been using Lasix therapy without significant improvement with his worsening shortness of breath he came to the emergency room for admission and evaluation.  Past Medical History:  Diagnosis Date  . Allergy   . Arthritis   . CAD (coronary artery disease)   . Diabetes mellitus without complication ([place])   . Erosive esophagitis   . GERD (gastroesophageal reflux disease)   . Gouty arthropathy   . History of colonic polyps   . History of kidney stones   . History of MRSA infection   . Hyperkalemia   . Hyperlipidemia   . Hypertension   . Melanoma ([place])   . Myocardial infarction ([place]) 1986  . Peripheral vascular disease ([place])   . 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 Trigger finger of left hand   . Ureteral tumor     Past Surgical History:  Procedure Laterality Date  . CARDIAC CATHETERIZATION    . CATARACT EXTRACTION    . CORONARY ARTERY BYPASS GRAFT  1996  . CORONARY STENT INTERVENTION N/A 05/29/2017   Procedure: CORONARY STENT INTERVENTION;  Surgeon: Isaias Cowman, MD;  Location: [place] [facility];  Service: Cardiovascular;  Laterality: N/A;  . EXCISION Ureteral Tumor    . EYE SURGERY Bilateral    Cataract Extraction with IOL  . LEFT HEART CATH AND  CORONARY ANGIOGRAPHY N/A 10/18/2016   Procedure: Left Heart Cath and Coronary Angiography;  Surgeon: Isaias Cowman, MD;  Location: [place] [facility];  Service: Cardiovascular;  Laterality: N/A;  . LEFT HEART CATH AND CORONARY ANGIOGRAPHY N/A 05/29/2017   Procedure: LEFT HEART CATH AND CORONARY ANGIOGRAPHY;  Surgeon: Isaias Cowman, MD;  Location: [place] [facility];  Service: Cardiovascular;  Laterality: N/A;  . LOWER EXTREMITY ANGIOGRAPHY Right 10/31/2016   Procedure: Lower Extremity Angiography;  Surgeon: Algernon Huxley, MD;  Location: [place] [facility];  Service: Cardiovascular;  Laterality: Right;  . LOWER EXTREMITY ANGIOGRAPHY Left 11/14/2016   Procedure: Lower Extremity Angiography;  Surgeon: Algernon Huxley, MD;  Location: [place] [facility];  Service: Cardiovascular;  Laterality: Left;  . LOWER EXTREMITY INTERVENTION  11/14/2016   Procedure: Lower Extremity Intervention;  Surgeon: Algernon Huxley, MD;  Location: [place] [facility];  Service: Cardiovascular;;  . RETINAL DETACHMENT SURGERY Right November 2107    Family History  Problem Relation Age of Onset  . Alzheimer's disease Mother   . Alzheimer's disease Brother   . Lung cancer Paternal Grandfather     Social History:  reports that he has been smoking cigarettes.  He has a 51.00 pack-year smoking history. he has never used smokeless tobacco. He reports that he drinks alcohol. He reports that he does not use drugs.  Allergies:  Allergies  Allergen Reactions  . Ciprofloxacin Itching and Swelling    Facial swelling  Medications: I have reviewed the patient's current medications.  Results for orders placed or performed during the hospital encounter of 06/10/17 (from the past 48 hour(s))  Comprehensive metabolic panel     Status: Abnormal   Collection Time: 06/10/17 12:51 PM  Result Value Ref Range   Sodium 136 135 - 145 mmol/L   Potassium 5.3 (H) 3.5 - 5.1 mmol/L   Chloride 109 101 - 111 mmol/L   CO2 17 (L)  22 - 32 mmol/L   Glucose, Bld 178 (H) 65 - 99 mg/dL   BUN 43 (H) 6 - 20 mg/dL   Creatinine, Ser 2.91 (H) 0.61 - 1.24 mg/dL   Calcium 8.6 (L) 8.9 - 10.3 mg/dL   Total Protein 6.1 (L) 6.5 - 8.1 g/dL   Albumin 2.7 (L) 3.5 - 5.0 g/dL   AST 21 15 - 41 U/L   ALT 18 17 - 63 U/L   Alkaline Phosphatase 101 38 - 126 U/L   Total Bilirubin 0.4 0.3 - 1.2 mg/dL   GFR calc non Af Amer 20 (L) >60 mL/min   GFR calc Af Amer 23 (L) >60 mL/min    Comment: (NOTE) The eGFR has been calculated using the CKD EPI equation. This calculation has not been validated in all clinical situations. eGFR's persistently <60 mL/min signify possible Chronic Kidney Disease.    Anion gap 10 5 - 15    Comment: Performed at [facility], [address] [place], [place] [address]  Brain natriuretic peptide     Status: Abnormal   Collection Time: 06/10/17 12:51 PM  Result Value Ref Range   B Natriuretic Peptide 2,312.0 (H) 0.0 - 100.0 pg/mL    Comment: Performed at [facility], [address] [place], [place] [address]  CBC     Status: Abnormal   Collection Time: 06/10/17 12:53 PM  Result Value Ref Range   WBC 10.1 3.8 - 10.6 K/uL   RBC 2.83 (L) 4.40 - 5.90 MIL/uL   Hemoglobin 8.1 (L) 13.0 - 18.0 g/dL   HCT 24.9 (L) 40.0 - 52.0 %   MCV 88.1 80.0 - 100.0 fL   MCH 28.5 26.0 - 34.0 pg   MCHC 32.4 32.0 - 36.0 g/dL   RDW 16.0 (H) 11.5 - 14.5 %   Platelets 518 (H) 150 - 440 K/uL    Comment: Performed at [facility], [address] [place], [place] [address]  Troponin I     Status: Abnormal   Collection Time: 06/10/17 12:53 PM  Result Value Ref Range   Troponin I 0.04 (HH) <0.03 ng/mL    Comment: CRITICAL RESULT CALLED TO, READ BACK BY AND VERIFIED WITH VALERIE CHANDLER AT 1342 ON 06/10/17 BY SNJ Performed at [facility], [address] [place], [place] [address]   Lipase, blood     Status: None   Collection Time: 06/10/17 12:53 PM  Result Value Ref Range   Lipase 41 11 -  51 U/L    Comment: Performed at [facility], [address] [place], [place] [address]  CBC     Status: Abnormal   Collection Time: 06/10/17  4:19 PM  Result Value Ref Range   WBC 10.8 (H) 3.8 - 10.6 K/uL   RBC 2.94 (L) 4.40 - 5.90 MIL/uL   Hemoglobin 8.3 (L) 13.0 - 18.0 g/dL   HCT 25.8 (L) 40.0 - 52.0 %   MCV 87.6 80.0 - 100.0 fL   MCH 28.3 26.0 - 34.0 pg  MCHC 32.3 32.0 - 36.0 g/dL   RDW 15.7 (H) 11.5 - 14.5 %   Platelets 557 (H) 150 - 440 K/uL    Comment: Performed at [facility], [address] [place], [place] [address]  Creatinine, serum     Status: Abnormal   Collection Time: 06/10/17  4:19 PM  Result Value Ref Range   Creatinine, Ser 2.86 (H) 0.61 - 1.24 mg/dL   GFR calc non Af Amer 20 (L) >60 mL/min   GFR calc Af Amer 24 (L) >60 mL/min    Comment: (NOTE) The eGFR has been calculated using the CKD EPI equation. This calculation has not been validated in all clinical situations. eGFR's persistently <60 mL/min signify possible Chronic Kidney Disease. Performed at [facility], [address] [place], [place] [address]   Troponin I     Status: Abnormal   Collection Time: 06/10/17  4:19 PM  Result Value Ref Range   Troponin I 0.04 (HH) <0.03 ng/mL    Comment: CRITICAL VALUE NOTED. VALUE IS CONSISTENT WITH PREVIOUSLY REPORTED/CALLED VALUE SNJ Performed at [facility], [address] [place], [place] [address]   Glucose, capillary     Status: Abnormal   Collection Time: 06/10/17  4:39 PM  Result Value Ref Range   Glucose-Capillary 139 (H) 65 - 99 mg/dL    Dg Chest 2 View  Result Date: 06/10/2017 CLINICAL DATA:  Shortness of breath. EXAM: CHEST  2 VIEW COMPARISON:  May 24, 2017 FINDINGS: Cardiomegaly. Mild pulmonary venous congestion/edema. Tiny effusions. No other acute abnormalities. IMPRESSION: Cardiomegaly and mild edema.  Tiny effusions. Electronically Signed   By: Dorise Bullion III M.D   On: 06/10/2017 13:21     Review of Systems  Constitutional: Positive for diaphoresis and malaise/fatigue.  HENT: Positive for congestion.   Eyes: Negative.   Respiratory: Positive for cough and shortness of breath.   Cardiovascular: Positive for chest pain, orthopnea, leg swelling and PND.  Gastrointestinal: Negative.   Genitourinary: Negative.   Musculoskeletal: Negative.   Skin: Negative.   Neurological: Positive for weakness.  Endo/Heme/Allergies: Negative.   Psychiatric/Behavioral: Negative.    Blood pressure (!) 145/85, pulse 79, temperature 98.1 F (36.7 C), temperature source Oral, resp. rate 18, height _0  (1.753 m), weight 165 lb 12.8 oz (75.2 kg), SpO2 97 %. Physical Exam  Nursing note and vitals reviewed. Constitutional: He is oriented to person, place, and time. He appears well-developed and well-nourished.  HENT:  Head: Normocephalic and atraumatic.  Eyes: Conjunctivae and EOM are normal. Pupils are equal, round, and reactive to light.  Neck: Normal range of motion. Neck supple.  Cardiovascular: Exam reveals gallop.  Murmur heard. Respiratory: He has decreased breath sounds in the right upper field, the right middle field, the right lower field, the left upper field, the left middle field and the left lower field. He has rales.  GI: Soft. Bowel sounds are normal.  Musculoskeletal: He exhibits edema.  Neurological: He is alert and oriented to person, place, and time. He has normal reflexes.  Skin: Skin is warm and dry.  Psychiatric: He has a normal mood and affect.    Assessment/Plan: Congestive heart failure systolic function Ischemic cardiomyopathy ejection fraction between 35-40% Shortness of breath Edema Coronary disease Chronic renal insufficiency stage III History of PCI and stent to LAD Hyperlipidemia Diabetes type 2 . Plan Agree with admit to telemetry Follow-up EKG troponins Recommend intravenous Lasix therapy for heart failure Recommend start hydralazine with  increased imdur for  heart failure Consider low-dose ACE inhibitor even in the face of renal insufficiency Continue beta-blockade therapy Referred to the heart failure clinic Recommend nephrology input for renal insufficiency Continue Plavix and aspirin post PCI and stent Agree with lipid management for hyperlipidemia  Dwayne D Callwood 06/10/2017, 7:23 PM

## 2017-06-10 NOTE — ED Triage Notes (Addendum)
Pt here for central chest pressure but when points epigastric area.  Had some SHOB but denies now. Pain RUQ on palpation.  Stent placed 2/11 with other blockages not stented per pt.

## 2017-06-10 NOTE — ED Provider Notes (Signed)
[facility] Emergency Department Provider Note  ____________________________________________   First MD Initiated Contact with Patient 06/10/17 1257     (approximate)  I have reviewed the triage vital signs and the nursing notes.   HISTORY  Chief Complaint Chest Pain   HPI BERYL BALZ is a 72 y.o. male with history of recent N STEMI status post stenting on Plavix who is presenting to the emergency department today with chest "discomfort" to the center of his chest over his sternum as well as vomiting earlier today.  He also reports increased shortness of breath over the week with increased swelling to his bilateral lower extremities.  He says that he is short of breath when he lays flat and has difficulty sleeping.  He said that he vomited once today after he "went outside to get some air" after feeling short of breath while he was seated in a recliner.  He says the chest pressure has been intermittent ever since he left the hospital and he has not had any "pain."  Was brought in by EMS and was not given any medications en route.  Patient also started on Lasix just several days ago and says that he is also been feeling cramping in his abdomen over the past week ever since being discharged the hospital and started on multiple new medications.  Past Medical History:  Diagnosis Date  . Allergy   . Arthritis   . CAD (coronary artery disease)   . Diabetes mellitus without complication ([place])   . Erosive esophagitis   . GERD (gastroesophageal reflux disease)   . Gouty arthropathy   . History of colonic polyps   . History of kidney stones   . History of MRSA infection   . Hyperkalemia   . Hyperlipidemia   . Hypertension   . Melanoma ([place])   . Myocardial infarction ([place]) 1986  . Peripheral vascular disease ([place])   . 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 Trigger finger of left hand   . Ureteral tumor     Patient Active Problem List   Diagnosis Date Noted  . Systolic dysfunction 37/16/9678  . Acute on chronic renal failure ([place]) 05/24/2017  . CAD (coronary artery disease) of artery bypass graft 10/25/2016  . Abnormal ankle brachial index (ABI) 10/13/2016  . Intermittent claudication ([place]) 09/30/2016  . PVC (premature ventricular contraction) 09/30/2016  . Hyperlipidemia 09/30/2016  . Aortic atherosclerosis ([place]) 06/17/2016  . Duodenitis 11/24/2014  . Erosive esophagitis 11/24/2014  . Personal history of methicillin resistant Staphylococcus aureus 11/24/2014  . High potassium 11/24/2014  . Melanoma of skin ([place]) 11/24/2014  . Acquired trigger finger 11/24/2014  . 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 DM (diabetes mellitus), type 2 with renal complications ([place]) 93/81/0175  . History of smoking 30 or more pack years 08/26/2009  . Allergic rhinitis 08/06/2006  . 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 History of colon polyps 05/09/2003  . 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 Essential (primary) hypertension 06/09/1995    Past Surgical History:  Procedure Laterality Date  . CARDIAC CATHETERIZATION    . CATARACT EXTRACTION    . CORONARY ARTERY BYPASS GRAFT  1996  . CORONARY STENT INTERVENTION N/A 05/29/2017   Procedure: CORONARY STENT INTERVENTION;  Surgeon: Isaias Cowman, MD;  Location: [facility];  Service: Cardiovascular;  Laterality: N/A;  . EXCISION Ureteral Tumor    . EYE SURGERY Bilateral  Cataract Extraction with IOL  . LEFT HEART CATH AND CORONARY ANGIOGRAPHY N/A 10/18/2016   Procedure: Left Heart Cath and Coronary Angiography;  Surgeon: Isaias Cowman, MD;  Location: [facility];  Service: Cardiovascular;  Laterality: N/A;  . LEFT HEART CATH AND CORONARY ANGIOGRAPHY N/A 05/29/2017   Procedure: LEFT HEART CATH AND CORONARY ANGIOGRAPHY;  Surgeon: Isaias Cowman, MD;  Location: [facility];  Service:  Cardiovascular;  Laterality: N/A;  . LOWER EXTREMITY ANGIOGRAPHY Right 10/31/2016   Procedure: Lower Extremity Angiography;  Surgeon: Algernon Huxley, MD;  Location: [facility];  Service: Cardiovascular;  Laterality: Right;  . LOWER EXTREMITY ANGIOGRAPHY Left 11/14/2016   Procedure: Lower Extremity Angiography;  Surgeon: Algernon Huxley, MD;  Location: [facility];  Service: Cardiovascular;  Laterality: Left;  . LOWER EXTREMITY INTERVENTION  11/14/2016   Procedure: Lower Extremity Intervention;  Surgeon: Algernon Huxley, MD;  Location: [facility];  Service: Cardiovascular;;  . RETINAL DETACHMENT SURGERY Right November 2107    Prior to Admission medications   Medication Sig Start Date End Date Taking? Authorizing Provider  acetaminophen (TYLENOL) 500 MG tablet Take 500 mg by mouth every 8 (eight) hours as needed for mild pain or headache.    [provider]  amLODipine (NORVASC) 5 MG tablet Take 1 tablet (5 mg total) by mouth daily. 05/31/17   Nicholes Mango, MD  aspirin EC 81 MG tablet Take 81 mg by mouth daily.    [provider]  atorvastatin (LIPITOR) 80 MG tablet TAKE ONE TABLET BY MOUTH EVERY DAY 02/21/17   Birdie Sons, MD  b complex vitamins capsule Take 1 capsule by mouth daily.    [provider]  buPROPion (WELLBUTRIN SR) 150 MG 12 hr tablet 1 tablet daily for 3 days, then 1 tablet twice daily. Stop smoking 14 days after starting medication 06/07/17 10/05/17  Birdie Sons, MD  clopidogrel (PLAVIX) 75 MG tablet Take 1 tablet (75 mg total) by mouth daily. 05/30/17   Nicholes Mango, MD  furosemide (LASIX) 20 MG tablet Take 1 tablet (20 mg total) by mouth daily. 06/07/17   Birdie Sons, MD  glipiZIDE (GLUCOTROL XL) 10 MG 24 hr tablet TAKE ONE (1) TABLET BY MOUTH EVERY DAY WITH BREAKFAST 10/25/16   Birdie Sons, MD  glucose blood test strip  12/05/08   [provider]  HYDROcodone-acetaminophen (NORCO/VICODIN) 5-325 MG tablet Take 1  tablet by mouth every 6 (six) hours as needed for moderate pain. Patient not taking: Reported on 06/07/2017 05/30/17   Nicholes Mango, MD  hydrocortisone cream 1 % Apply 1 application topically as needed for itching.    [provider]  isosorbide mononitrate (IMDUR) 60 MG 24 hr tablet Take 1 tablet (60 mg total) by mouth daily. 05/31/17   Nicholes Mango, MD  metoprolol succinate (TOPROL-XL) 50 MG 24 hr tablet Take 1 tablet (50 mg total) by mouth daily. Take with or immediately following a meal. 05/31/17   Gouru, Aruna, MD  pantoprazole (PROTONIX) 40 MG tablet Take 1 tablet (40 mg total) by mouth daily. 06/03/16   Birdie Sons, MD    Allergies Ciprofloxacin  Family History  Problem Relation Age of Onset  . Alzheimer's disease Mother   . Alzheimer's disease Brother   . Lung cancer Paternal Grandfather     Social History Social History   Tobacco Use  . Smoking status: Current Every Day Smoker    Packs/day: 1.00  Years: 51.00    Pack years: 51.00    Types: Cigarettes  . Smokeless tobacco: Never Used  Substance Use Topics  . Alcohol use: Yes    Alcohol/week: 0.0 oz    Comment:  2 beers a month  . Drug use: No    Review of Systems  Constitutional: No fever/chills Eyes: No visual changes. ENT: No sore throat. Cardiovascular: As above Respiratory: As above Gastrointestinal: No abdominal pain.  No diarrhea.  No constipation. Genitourinary: Negative for dysuria. Musculoskeletal: Negative for back pain. Skin: Negative for rash. Neurological: Negative for headaches, focal weakness or numbness.   ____________________________________________   PHYSICAL EXAM:  VITAL SIGNS: ED Triage Vitals  Enc Vitals Group     BP 06/10/17 1256 138/86     Pulse Rate 06/10/17 1256 84     Resp 06/10/17 1256 20     Temp 06/10/17 1256 97.8 F (36.6 C)     Temp Source 06/10/17 1256 Oral     SpO2 06/10/17 1256 98 %     Weight 06/10/17 1251 175 lb (79.4 kg)     Height 06/10/17 1251  5\' 9"  (1.753 m)     Head Circumference --      Peak Flow --      Pain Score 06/10/17 1251 5     Pain Loc --      Pain Edu? --      Excl. in [place]? --     Constitutional: Alert and oriented. Well appearing and in no acute distress. Eyes: Conjunctivae are normal.  Head: Atraumatic. Nose: No congestion/rhinnorhea. Mouth/Throat: Mucous membranes are moist.  Neck: No stridor.   Cardiovascular: Normal rate, regular rhythm. Grossly normal heart sounds.   Respiratory: Mildly tachypneic with respirations 22 breaths/min.  Mild rales to the bilateral bases.  Speaks in full sentences.  No respiratory distress. Gastrointestinal: Soft and nontender. No distention. No CVA tenderness. Musculoskeletal: Moderate bilateral lower extremity edema. Neurologic:  Normal speech and language. No gross focal neurologic deficits are appreciated. Skin:  Skin is warm, dry and intact. No rash noted. Psychiatric: Mood and affect are normal. Speech and behavior are normal.  ____________________________________________   LABS (all labs ordered are listed, but only abnormal results are displayed)  Labs Reviewed  CBC - Abnormal; Notable for the following components:      Result Value   RBC 2.83 (*)    Hemoglobin 8.1 (*)    HCT 24.9 (*)    RDW 16.0 (*)    Platelets 518 (*)    All other components within normal limits  TROPONIN I - Abnormal; Notable for the following components:   Troponin I 0.04 (*)    All other components within normal limits  COMPREHENSIVE METABOLIC PANEL - Abnormal; Notable for the following components:   Potassium 5.3 (*)    CO2 17 (*)    Glucose, Bld 178 (*)    BUN 43 (*)    Creatinine, Ser 2.91 (*)    Calcium 8.6 (*)    Total Protein 6.1 (*)    Albumin 2.7 (*)    GFR calc non Af Amer 20 (*)    GFR calc Af Amer 23 (*)    All other components within normal limits  BRAIN NATRIURETIC PEPTIDE - Abnormal; Notable for the following components:   B Natriuretic Peptide 2,312.0 (*)    All  other components within normal limits  LIPASE, BLOOD   ____________________________________________  EKG  ED ECG REPORT I, Doran Stabler, the attending physician,  personally viewed and interpreted this ECG.   Date: 06/10/2017  EKG Time: 1254  Rate: 85  Rhythm: normal sinus rhythm with PVC x1.   Axis: Normal  Intervals: Right bundle branch block and left posterior fascicular block.  ST&T Change: Biphasic T waves in aVF, V3 through V5.  No significant change from previous ____________________________________________  RADIOLOGY  Cardiomegaly with mild edema.  Tiny effusions. ____________________________________________   PROCEDURES  Procedure(s) performed:   Procedures  Critical Care performed:   ____________________________________________   INITIAL IMPRESSION / ASSESSMENT AND PLAN / ED COURSE  Pertinent labs & imaging results that were available during my care of the patient were reviewed by me and considered in my medical decision making (see chart for details).  Differential diagnosis includes, but is not limited to, ACS, aortic dissection, pulmonary embolism, cardiac tamponade, pneumothorax, pneumonia, pericarditis, myocarditis, GI-related causes including esophagitis/gastritis, and musculoskeletal chest wall pain.   Differential includes, but is not limited to, viral syndrome, bronchitis including COPD exacerbation, pneumonia, reactive airway disease including asthma, CHF including exacerbation with or without pulmonary/interstitial edema, pneumothorax, ACS, thoracic trauma, and pulmonary embolism. As part of my medical decision making, I reviewed the following data within the [facility] chart reviewed and Notes from prior ED visits  ----------------------------------------- 2:40 PM on 06/10/2017 -----------------------------------------  Patient with persistent shortness of breath now with diaphoresis after Lasix.  Patient found to be  hyperkalemic with decreased renal function.  Elevated BNP with evidence of CHF on his chest x-ray.  Patient will be admitted to the hospital.  Patient as well as family are aware.  Signed out to Dr. Verdell Carmine.  ____________________________________________   FINAL CLINICAL IMPRESSION(S) / ED DIAGNOSES  CHF.  Hyperkalemia.    NEW MEDICATIONS STARTED DURING THIS VISIT:  New Prescriptions   No medications on file     Note:  This document was prepared using Dragon voice recognition software and may include unintentional dictation errors.     Orbie Pyo, MD 06/10/17 1440

## 2017-06-10 NOTE — H&P (Signed)
Thomas [name] at [place] NAME: Thomas [name]    MR#:  [id number]  DATE OF BIRTH:  Feb 20, 1946  DATE OF ADMISSION:  06/10/2017  PRIMARY CARE PHYSICIAN: Birdie Sons, MD   REQUESTING/REFERRING PHYSICIAN: Dr.  Larae Grooms  CHIEF COMPLAINT:   Chief Complaint  Patient presents with  . Chest Pain  Shortness of breath  HISTORY OF PRESENT ILLNESS:  Thomas [name]  is a 72 y.o. male with a known history of hypertension, hyperlipidemia, chronic kidney disease stage III, peripheral vascular disease, history of coronary artery disease status post bypass, recent stent placement, diabetes, osteoarthritis who presents to the hospital due to shortness of breath and lower extremity edema. Patient was recently hospital for unstable angina/non-ST elevation MI and underwent a drug-eluting stent to mid LAD. He has been doing fine until the past 3-4 days he developed worsening shortness of breath at rest and even on exertion associated with lower extremity edema, paroxysmal nocturnal dyspnea 2-3 pillow orthopnea. He was seen by his primary care physician started on some oral furosemide for 3 days but despite that he continued to feel short of breath and therefore came to the ER for further evaluation. Patient was noted to be in congestive heart failure. Hospitalist services were contacted further treatment and evaluation.  PAST MEDICAL HISTORY:   Past Medical History:  Diagnosis Date  . Allergy   . Arthritis   . CAD (coronary artery disease)   . Diabetes mellitus without complication ([place])   . Erosive esophagitis   . GERD (gastroesophageal reflux disease)   . Gouty arthropathy   . History of colonic polyps   . History of kidney stones   . History of MRSA infection   . Hyperkalemia   . Hyperlipidemia   . Hypertension   . Melanoma ([place])   . Myocardial infarction ([place]) 1986  . Peripheral vascular disease ([place])   . Squamous cell cancer of external ear,  right    07/2016  . Trigger finger of left hand   . Ureteral tumor     PAST SURGICAL HISTORY:   Past Surgical History:  Procedure Laterality Date  . CARDIAC CATHETERIZATION    . CATARACT EXTRACTION    . CORONARY ARTERY BYPASS GRAFT  1996  . CORONARY STENT INTERVENTION N/A 05/29/2017   Procedure: CORONARY STENT INTERVENTION;  Surgeon: Isaias Cowman, MD;  Location: [facility];  Service: Cardiovascular;  Laterality: N/A;  . EXCISION Ureteral Tumor    . EYE SURGERY Bilateral    Cataract Extraction with IOL  . LEFT HEART CATH AND CORONARY ANGIOGRAPHY N/A 10/18/2016   Procedure: Left Heart Cath and Coronary Angiography;  Surgeon: Isaias Cowman, MD;  Location: [facility];  Service: Cardiovascular;  Laterality: N/A;  . LEFT HEART CATH AND CORONARY ANGIOGRAPHY N/A 05/29/2017   Procedure: LEFT HEART CATH AND CORONARY ANGIOGRAPHY;  Surgeon: Isaias Cowman, MD;  Location: [facility];  Service: Cardiovascular;  Laterality: N/A;  . LOWER EXTREMITY ANGIOGRAPHY Right 10/31/2016   Procedure: Lower Extremity Angiography;  Surgeon: Algernon Huxley, MD;  Location: [facility];  Service: Cardiovascular;  Laterality: Right;  . LOWER EXTREMITY ANGIOGRAPHY Left 11/14/2016   Procedure: Lower Extremity Angiography;  Surgeon: Algernon Huxley, MD;  Location: [facility];  Service: Cardiovascular;  Laterality: Left;  . LOWER EXTREMITY INTERVENTION  11/14/2016   Procedure: Lower Extremity Intervention;  Surgeon: Algernon Huxley, MD;  Location: [facility];  Service:  Cardiovascular;;  . RETINAL DETACHMENT SURGERY Right November 2107    SOCIAL HISTORY:   Social History   Tobacco Use  . Smoking status: Current Every Day Smoker    Packs/day: 1.00    Years: 51.00    Pack years: 51.00    Types: Cigarettes  . Smokeless tobacco: Never Used  Substance Use Topics  . Alcohol use: Yes    Alcohol/week: 0.0 oz    Comment:  2 beers a month    FAMILY  HISTORY:   Family History  Problem Relation Age of Onset  . Alzheimer's disease Mother   . Alzheimer's disease Brother   . Lung cancer Paternal Grandfather     DRUG ALLERGIES:   Allergies  Allergen Reactions  . Ciprofloxacin Itching and Swelling    Facial swelling     REVIEW OF SYSTEMS:   Review of Systems  Constitutional: Negative for fever and weight loss.  HENT: Negative for congestion, nosebleeds and tinnitus.   Eyes: Negative for blurred vision, double vision and redness.  Respiratory: Positive for shortness of breath. Negative for cough and hemoptysis.   Cardiovascular: Positive for orthopnea, leg swelling and PND. Negative for chest pain.  Gastrointestinal: Negative for abdominal pain, diarrhea, melena, nausea and vomiting.  Genitourinary: Negative for dysuria, hematuria and urgency.  Musculoskeletal: Negative for falls and joint pain.  Neurological: Negative for dizziness, tingling, sensory change, focal weakness, seizures, weakness and headaches.  Endo/Heme/Allergies: Negative for polydipsia. Does not bruise/bleed easily.  Psychiatric/Behavioral: Negative for depression and memory loss. The patient is not nervous/anxious.     MEDICATIONS AT HOME:   Prior to Admission medications   Medication Sig Start Date End Date Taking? Authorizing Provider  amLODipine (NORVASC) 5 MG tablet Take 1 tablet (5 mg total) by mouth daily. 05/31/17  Yes Gouru, Illene Silver, MD  aspirin EC 81 MG tablet Take 81 mg by mouth daily.   Yes [provider]  atorvastatin (LIPITOR) 80 MG tablet TAKE ONE TABLET BY MOUTH EVERY DAY 02/21/17  Yes Birdie Sons, MD  b complex vitamins capsule Take 1 capsule by mouth daily.   Yes [provider]  buPROPion (WELLBUTRIN SR) 150 MG 12 hr tablet 1 tablet daily for 3 days, then 1 tablet twice daily. Stop smoking 14 days after starting medication 06/07/17 10/05/17 Yes Birdie Sons, MD  clopidogrel (PLAVIX) 75 MG tablet Take 1 tablet (75 mg  total) by mouth daily. 05/30/17  Yes Gouru, Illene Silver, MD  furosemide (LASIX) 20 MG tablet Take 1 tablet (20 mg total) by mouth daily. 06/07/17  Yes Birdie Sons, MD  glipiZIDE (GLUCOTROL XL) 10 MG 24 hr tablet TAKE ONE (1) TABLET BY MOUTH EVERY DAY WITH BREAKFAST 10/25/16  Yes Birdie Sons, MD  isosorbide mononitrate (IMDUR) 60 MG 24 hr tablet Take 1 tablet (60 mg total) by mouth daily. 05/31/17  Yes Gouru, Illene Silver, MD  metoprolol succinate (TOPROL-XL) 50 MG 24 hr tablet Take 1 tablet (50 mg total) by mouth daily. Take with or immediately following a meal. 05/31/17  Yes Gouru, Aruna, MD  pantoprazole (PROTONIX) 40 MG tablet Take 1 tablet (40 mg total) by mouth daily. 06/03/16  Yes Birdie Sons, MD  acetaminophen (TYLENOL) 500 MG tablet Take 500 mg by mouth every 8 (eight) hours as needed for mild pain or headache.    [provider]  glucose blood test strip  12/05/08   [provider]  HYDROcodone-acetaminophen (NORCO/VICODIN) 5-325 MG tablet Take 1 tablet by mouth every 6 (  six) hours as needed for moderate pain. Patient not taking: Reported on 06/07/2017 05/30/17   Nicholes Mango, MD  hydrocortisone cream 1 % Apply 1 application topically as needed for itching.    [provider]      VITAL SIGNS:  Blood pressure (!) 144/89, pulse 79, temperature 97.8 F (36.6 C), temperature source Oral, resp. rate 20, height 5\' 9"  (1.753 m), weight 79.4 kg (175 lb), SpO2 97 %.  PHYSICAL EXAMINATION:  Physical Exam  GENERAL:  72 y.o.-year-old patient lying in the bed with no acute distress.  EYES: Pupils equal, round, reactive to light and accommodation. No scleral icterus. Extraocular muscles intact.  HEENT: Head atraumatic, normocephalic. Oropharynx and nasopharynx clear. No oropharyngeal erythema, moist oral mucosa  NECK:  Supple, no jugular venous distention. No thyroid enlargement, no tenderness.  LUNGS: Normal breath sounds bilaterally, no wheezing, b/l rales @ bases, No  rhonchi. No use of accessory muscles of respiration.  CARDIOVASCULAR: S1, S2 RRR. No murmurs, rubs, gallops, clicks.  ABDOMEN: Soft, nontender, nondistended. Bowel sounds present. No organomegaly or mass.  EXTREMITIES:+2 peripheral edema b/l, No cyanosis, or clubbing. + 2 pedal & radial pulses b/l.   NEUROLOGIC: Cranial nerves II through XII are intact. No focal Motor or sensory deficits appreciated b/l PSYCHIATRIC: The patient is alert and oriented x 3. SKIN: No obvious rash, lesion, or ulcer.   LABORATORY PANEL:   CBC Recent Labs  Lab 06/10/17 1253  WBC 10.1  HGB 8.1*  HCT 24.9*  PLT 518*   ------------------------------------------------------------------------------------------------------------------  Chemistries  Recent Labs  Lab 06/10/17 1251  NA 136  K 5.3*  CL 109  CO2 17*  GLUCOSE 178*  BUN 43*  CREATININE 2.91*  CALCIUM 8.6*  AST 21  ALT 18  ALKPHOS 101  BILITOT 0.4   ------------------------------------------------------------------------------------------------------------------  Cardiac Enzymes Recent Labs  Lab 06/10/17 1253  TROPONINI 0.04*   ------------------------------------------------------------------------------------------------------------------  RADIOLOGY:  Dg Chest 2 View  Result Date: 06/10/2017 CLINICAL DATA:  Shortness of breath. EXAM: CHEST  2 VIEW COMPARISON:  May 24, 2017 FINDINGS: Cardiomegaly. Mild pulmonary venous congestion/edema. Tiny effusions. No other acute abnormalities. IMPRESSION: Cardiomegaly and mild edema.  Tiny effusions. Electronically Signed   By: Dorise Bullion III M.D   On: 06/10/2017 13:21     IMPRESSION AND PLAN:   72 year old male with past medical history of coronary artery disease status post recent stent placement, diabetes, hypertension, hyperlipidemia, chronic kidney disease stage III, osteoarthritis, who presents to the hospital due to shortness of breath, lower extremity edema.  1. CHF-acute  on chronic systolic dysfunction. Patient's recent echocardiogram showed EF of 35-40%. -We'll diurese the patient with IV Lasix, follow I's and O's and daily weights.  -continue Toprol, patient not on a is due to chronic kidney disease. -I will go ahead and consult cardiology.  2. Chronic kidney disease stage III-patient's creatinine is currently close to baseline. We'll watch with IV diuresis. -Patient is supposed to see nephrology as an outpatient next week, we'll consult nephrology with the patient is in the hospital. Discussed with Dr. Candiss Norse.  3. Diabetes type 2 without complication-hold glipizide, Place on sliding scale insulin. Follow blood sugars.  4. Essential hypertension-continue Imdur, Toprol, Norvasc  5. GERD-continue Protonix.  6. Recent non-ST elevation MI status post stent placement - continue aspirin, Plavix, atorvastatin, Toprol.  7. Tobacco abuse-continue Wellbutrin.  All the records are reviewed and case discussed with ED provider. Management plans discussed with the patient, family and they are in agreement.  CODE STATUS:  Full code  TOTAL TIME TAKING CARE OF THIS PATIENT: 45 minutes.    Henreitta Leber M.D on 06/10/2017 at 3:05 PM  Between 7am to 6pm - Pager - [phone]  After 6pm go to [email or web address] - password [email or web address] [facility]  Office  [phone]  CC: Primary care physician; Birdie Sons, MD

## 2017-06-11 LAB — BASIC METABOLIC PANEL
Anion gap: 9 (ref 5–15)
BUN: 46 mg/dL — AB (ref 6–20)
CHLORIDE: 109 mmol/L (ref 101–111)
CO2: 19 mmol/L — ABNORMAL LOW (ref 22–32)
CREATININE: 3.11 mg/dL — AB (ref 0.61–1.24)
Calcium: 8.4 mg/dL — ABNORMAL LOW (ref 8.9–10.3)
GFR calc Af Amer: 21 mL/min — ABNORMAL LOW (ref >60)
GFR calc non Af Amer: 18 mL/min — ABNORMAL LOW (ref >60)
GLUCOSE: 133 mg/dL — AB (ref 65–99)
Potassium: 5.1 mmol/L (ref 3.5–5.1)
Sodium: 137 mmol/L (ref 135–145)

## 2017-06-11 LAB — GLUCOSE, CAPILLARY
GLUCOSE-CAPILLARY: 193 mg/dL — AB (ref 65–99)
Glucose-Capillary: 122 mg/dL — ABNORMAL HIGH (ref 65–99)
Glucose-Capillary: 104 mg/dL — ABNORMAL HIGH (ref 65–99)
Glucose-Capillary: 206 mg/dL — ABNORMAL HIGH (ref 65–99)

## 2017-06-11 LAB — TROPONIN I: Troponin I: 0.04 ng/mL (ref <0.03)

## 2017-06-11 NOTE — Progress Notes (Addendum)
[place] at [place] NAME: Thomas [name]    MR#:  [id number]  DATE OF BIRTH:  [birth year]  SUBJECTIVE:  CHIEF COMPLAINT:   Chief Complaint  Patient presents with  . Chest Pain   Better shortness of breath and cough and leg edema. REVIEW OF SYSTEMS:  Review of Systems  Constitutional: Negative for chills, fever and malaise/fatigue.  HENT: Negative for sore throat.   Eyes: Negative for blurred vision and double vision.  Respiratory: Positive for cough and shortness of breath. Negative for hemoptysis, wheezing and stridor.   Cardiovascular: Positive for leg swelling. Negative for chest pain, palpitations and orthopnea.  Gastrointestinal: Negative for abdominal pain, blood in stool, diarrhea, melena, nausea and vomiting.  Genitourinary: Negative for dysuria, flank pain and hematuria.  Musculoskeletal: Negative for back pain and joint pain.  Skin: Negative for rash.  Neurological: Negative for dizziness, sensory change, focal weakness, seizures, loss of consciousness, weakness and headaches.  Endo/Heme/Allergies: Negative for polydipsia.  Psychiatric/Behavioral: Negative for depression. The patient is not nervous/anxious.     DRUG ALLERGIES:   Allergies  Allergen Reactions  . Ciprofloxacin Itching and Swelling    Facial swelling    VITALS:  Blood pressure 123/77, pulse 64, temperature 98.2 F (36.8 C), temperature source Oral, resp. rate 20, height 5\' 9"  (1.753 m), weight 165 lb 6.4 oz (75 kg), SpO2 92 %. PHYSICAL EXAMINATION:  Physical Exam  Constitutional: He is oriented to person, place, and time and well-developed, well-nourished, and in no distress.  HENT:  Head: Normocephalic.  Mouth/Throat: Oropharynx is clear and moist.  Eyes: Conjunctivae and EOM are normal. Pupils are equal, round, and reactive to light. No scleral icterus.  Neck: Normal range of motion. Neck supple. No JVD present. No tracheal deviation present.    Cardiovascular: Normal rate, regular rhythm and normal heart sounds. Exam reveals no gallop.  No murmur heard. Pulmonary/Chest: Effort normal. No respiratory distress. He has no wheezes. He has rales.  Abdominal: Soft. Bowel sounds are normal. He exhibits no distension. There is no tenderness. There is no rebound.  Musculoskeletal: Normal range of motion. He exhibits edema. He exhibits no tenderness.  Neurological: He is alert and oriented to person, place, and time. No cranial nerve deficit.  Skin: No rash noted. No erythema.  Psychiatric: Affect normal.   LABORATORY PANEL:  Male CBC Recent Labs  Lab 06/10/17 1619  WBC 10.8*  HGB 8.3*  HCT 25.8*  PLT 557*   ------------------------------------------------------------------------------------------------------------------ Chemistries  Recent Labs  Lab 06/10/17 1251  06/11/17 0004  NA 136  --  137  K 5.3*  --  5.1  CL 109  --  109  CO2 17*  --  19*  GLUCOSE 178*  --  133*  BUN 43*  --  46*  CREATININE 2.91*   < > 3.11*  CALCIUM 8.6*  --  8.4*  AST 21  --   --   ALT 18  --   --   ALKPHOS 101  --   --   BILITOT 0.4  --   --    < > = values in this interval not displayed.   RADIOLOGY:  No results found. ASSESSMENT AND PLAN:   72 year old male with past medical history of coronary artery disease status post recent stent placement, diabetes, hypertension, hyperlipidemia, chronic kidney disease stage III, osteoarthritis, who presents to the hospital due to shortness of breath, lower extremity edema.  1. Acute on chronic systolic CHF,  ejection fraction 35-40%. Patient's recent echocardiogram showed EF of 35-40%. Decreased IV Lasix due to worsening renal function, follow I's and O's and daily weights.   2. ARF on hronic kidney disease stage III Follow-up BMP while on Lasix.Marland Kitchen  3. 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  4. Essential hypertension- started hydralazine and  increased Imdur, continue Toprol, Norvasc  5. GERD-continue Protonix.  6. Recent non-ST elevation MI status post stent placement - continue aspirin, Plavix, atorvastatin, Toprol.  7. Tobacco abuse-continue Wellbutrin.  Smoking cessation was counseled for 3-4 minutes.  Hyperkalemia.  Improved.  Elevated troponin.  Due to demanding ischemia secondary to CHF decompensation.  Continue aspirin, Plavix and Lipitor.  Anemia of chronic disease.  Stable.  I discussed with Dr. Candiss Norse and Dr. Clayborn Bigness. All the records are reviewed and case discussed with Care Management/Social Worker. Management plans discussed with the patient, his wife and other family members and they are in agreement.  CODE STATUS: Full Code  TOTAL TIME TAKING CARE OF THIS PATIENT: 42 minutes.   More than 50% of the time was spent in counseling/coordination of care: YES  POSSIBLE D/C IN 2 DAYS, DEPENDING ON CLINICAL CONDITION.   Demetrios Loll M.D on 06/11/2017 at 3:06 PM  Between 7am to 6pm - Pager - [phone]  After 6pm go to [email or web address] - [facility]

## 2017-06-11 NOTE — Progress Notes (Signed)
[place], [place] 06/11/17  Subjective:   Patient known to our practice from previous admission.  He underwent angioplasty and stent placement last time. This admission, he presents for worsening shortness of breath and lower extremity edema over the past week. He was placed on low-dose Lasix of 20 mg daily but that did not help This admission, he was treated with IV furosemide 40 mg every 12 hours He has responded well.  Today, he is on room air.  Lower extremity edema seems to be improving as per patient although not resolved.  Serum creatinine has increased from 2.86-3.11 today Urine output 1100 cc last 24 hours  Objective:  Vital signs in last 24 hours:  Temp:  [97.4 F (36.3 C)-98.2 F (36.8 C)] 98.2 F (36.8 C) (02/24 0823) Pulse Rate:  [74-89] 89 (02/24 0823) Resp:  [16-20] 16 (02/24 0823) BP: (128-158)/(78-89) 158/82 (02/24 0823) SpO2:  [95 %-100 %] 98 % (02/24 0823) Weight:  [75 kg (165 lb 6.4 oz)-79.4 kg (175 lb)] 75 kg (165 lb 6.4 oz) (02/24 0410)  Weight change:  Filed Weights   06/10/17 1251 06/10/17 1619 06/11/17 0410  Weight: 79.4 kg (175 lb) 75.2 kg (165 lb 12.8 oz) 75 kg (165 lb 6.4 oz)    Intake/Output:    Intake/Output Summary (Last 24 hours) at 06/11/2017 1001 Last data filed at 06/11/2017 6734 Gross per 24 hour  Intake 480 ml  Output 1475 ml  Net -995 ml     Physical Exam: General:  Laying in the bed, no acute distress  HEENT  anicteric, moist oral mucous membranes  Neck  supple  Pulm/lungs  clear to auscultation bilaterally  CVS/Heart  regular rate and rhythm, S3 gallop  Abdomen:   Soft, nontender  Extremities:  2+ pitting edema bilaterally  Neurologic:  Alert, oriented  Skin:  No acute rashes          Basic Metabolic Panel:  Recent Labs  Lab 06/10/17 1251 06/10/17 1619 06/11/17 0004  NA 136  --  137  K 5.3*  --  5.1  CL 109  --  109  CO2 17*  --  19*  GLUCOSE 178*  --  133*  BUN 43*  --  46*   CREATININE 2.91* 2.86* 3.11*  CALCIUM 8.6*  --  8.4*     CBC: Recent Labs  Lab 06/10/17 1253 06/10/17 1619  WBC 10.1 10.8*  HGB 8.1* 8.3*  HCT 24.9* 25.8*  MCV 88.1 87.6  PLT 518* 557*     No results found for: HEPBSAG, HEPBSAB, HEPBIGM    Microbiology:  No results found for this or any previous visit (from the past 240 hour(s)).  Coagulation Studies: No results for input(s): LABPROT, INR in the last 72 hours.  Urinalysis: No results for input(s): COLORURINE, LABSPEC, PHURINE, GLUCOSEU, HGBUR, BILIRUBINUR, KETONESUR, PROTEINUR, UROBILINOGEN, NITRITE, LEUKOCYTESUR in the last 72 hours.  Invalid input(s): APPERANCEUR    Imaging: Dg Chest 2 View  Result Date: 06/10/2017 CLINICAL DATA:  Shortness of breath. EXAM: CHEST  2 VIEW COMPARISON:  May 24, 2017 FINDINGS: Cardiomegaly. Mild pulmonary venous congestion/edema. Tiny effusions. No other acute abnormalities. IMPRESSION: Cardiomegaly and mild edema.  Tiny effusions. Electronically Signed   By: Dorise Bullion III M.D   On: 06/10/2017 13:21     Medications:    . amLODipine  5 mg Oral Daily  . aspirin EC  81 mg Oral Daily  . atorvastatin  80 mg Oral Daily  . B-complex with  vitamin C  1 tablet Oral Daily  . buPROPion  150 mg Oral Daily  . clopidogrel  75 mg Oral Daily  . furosemide  40 mg Intravenous Q12H  . heparin  5,000 Units Subcutaneous Q8H  . hydrALAZINE  25 mg Oral Q8H  . insulin aspart  0-5 Units Subcutaneous QHS  . insulin aspart  0-9 Units Subcutaneous TID WC  . isosorbide mononitrate  60 mg Oral Daily  . metoprolol succinate  50 mg Oral Daily  . pantoprazole  40 mg Oral Daily  . sodium chloride flush  3 mL Intravenous Q12H   acetaminophen **OR** acetaminophen, diphenhydrAMINE, ondansetron **OR** ondansetron (ZOFRAN) IV  Assessment/ Plan:  72 y.o. Caucasian  male with diabetes, hypertension, severe coronary disease, CABG 1996, peripheral arterial disease, was admitted on2/6/2019with non-STEMI   And underwent angioplasty, Chronic systolic CHF; LVEF 38-75% This time presents with worsening edema and shortness of breath  1. Chronic kidney disease stage IV 2. Diabetes type 2 with chronic kidney disease 3. Worsening lower extremity edema  Plan: Agree with IV furosemide for now for volume control Serum creatinine is noted to have increased slightly.  We will continue to monitor closely. Discussed with patient that he needs to follow strict salt restriction at home Also discussed with his family that he needs to obtain a scale to keep a track of his weight at home   LOS: [place] 2/24/201910:01 AM  [place], [place]  Note: This note was prepared with Dragon dictation. Any transcription errors are unintentional

## 2017-06-11 NOTE — Plan of Care (Signed)
Reports breathing better.  No distress noted.  Diuresing continues.

## 2017-06-11 NOTE — Care Management Note (Signed)
Case Management Note  Patient Details  Name: Thomas [name] MRN: [id number] Date of Birth: 1945/07/10 Subjective/Objective:                Recent discharge after nstemi with PCI. Significant cardiac history and chronic renal disease. Room air. Admitted with shortness of breath and PCP attempted to treat sx with oral lasix. Did not resolve sx.  Admitted with chf  Action/Plan:   Expected Discharge Date:  06/12/17               Expected Discharge Plan:     In-House Referral:     Discharge planning Services     Post Acute Care Choice:    Choice offered to:     DME Arranged:    DME Agency:     HH Arranged:    HH Agency:     Status of Service:     If discussed at [employer], dates discussed:    Additional Comments:  Katrina Stack, RN 06/11/2017, 5:45 PM

## 2017-06-11 NOTE — Progress Notes (Signed)
Dr Marcille Blanco made aware of ectopics.  No further orders at this time.

## 2017-06-11 NOTE — Progress Notes (Signed)
Notified of 10 beat run VT.  Pt up to bathroom to void.  Denies chest pain or palpitations at this time.  Will monitor.

## 2017-06-12 LAB — GLUCOSE, CAPILLARY
GLUCOSE-CAPILLARY: 154 mg/dL — AB (ref 65–99)
GLUCOSE-CAPILLARY: 122 mg/dL — AB (ref 65–99)
Glucose-Capillary: 181 mg/dL — ABNORMAL HIGH (ref 65–99)
Glucose-Capillary: 140 mg/dL — ABNORMAL HIGH (ref 65–99)

## 2017-06-12 LAB — BASIC METABOLIC PANEL
ANION GAP: 9 (ref 5–15)
BUN: 53 mg/dL — ABNORMAL HIGH (ref 6–20)
CHLORIDE: 103 mmol/L (ref 101–111)
CO2: 19 mmol/L — ABNORMAL LOW (ref 22–32)
Calcium: 8.5 mg/dL — ABNORMAL LOW (ref 8.9–10.3)
Creatinine, Ser: 3.27 mg/dL — ABNORMAL HIGH (ref 0.61–1.24)
GFR calc Af Amer: 20 mL/min — ABNORMAL LOW (ref >60)
GFR calc non Af Amer: 17 mL/min — ABNORMAL LOW (ref >60)
Glucose, Bld: 139 mg/dL — ABNORMAL HIGH (ref 65–99)
POTASSIUM: 4.9 mmol/L (ref 3.5–5.1)
SODIUM: 131 mmol/L — AB (ref 135–145)

## 2017-06-12 MED ORDER — LORAZEPAM 1 MG PO TABS
1.0000 mg | ORAL_TABLET | Freq: Two times a day (BID) | ORAL | Status: DC | PRN
  Administered 2017-06-12: 1 mg via ORAL
  Filled 2017-06-12: qty 1

## 2017-06-12 MED ORDER — RAMELTEON 8 MG PO TABS
8.0000 mg | ORAL_TABLET | Freq: Every day | ORAL | Status: DC
  Administered 2017-06-13 (×2): 8 mg via ORAL
  Filled 2017-06-12 (×3): qty 1

## 2017-06-12 NOTE — Progress Notes (Signed)
[facility]  ROUNDING NOTE   Subjective:   Family at bedside.  UOP 1575  Na 131 Creatinine 3.27 (3.11)  Objective:  Vital signs in last 24 hours:  Temp:  [97.6 F (36.4 C)-97.7 F (36.5 C)] 97.6 F (36.4 C) (02/25 0330) Pulse Rate:  [69-76] 76 (02/25 0732) Resp:  [17-18] 17 (02/25 0732) BP: (119-150)/(63-82) 150/82 (02/25 0732) SpO2:  [97 %-98 %] 98 % (02/25 0330) Weight:  [73.9 kg (162 lb 14.4 oz)] 73.9 kg (162 lb 14.4 oz) (02/25 0330)  Weight change: -5.489 kg (-1.6 oz) Filed Weights   06/10/17 1619 06/11/17 0410 06/12/17 0330  Weight: 75.2 kg (165 lb 12.8 oz) 75 kg (165 lb 6.4 oz) 73.9 kg (162 lb 14.4 oz)    Intake/Output: I/O last 3 completed shifts: In: 840 [P.O.:840] Out: 2075 [Urine:2075]   Intake/Output this shift:  Total I/O In: 600 [P.O.:600] Out: 1040 [Urine:1040]  Physical Exam: General: NAD,   Head: Normocephalic, atraumatic. Moist oral mucosal membranes  Eyes: Anicteric, PERRL  Neck: Supple, trachea midline  Lungs:  Clear to auscultation  Heart: Regular rate and rhythm  Abdomen:  Soft, nontender,   Extremities: + peripheral edema.  Neurologic: Nonfocal, moving all four extremities  Skin: No lesions        Basic Metabolic Panel: Recent Labs  Lab 06/10/17 1251 06/10/17 1619 06/11/17 0004 06/12/17 0522  NA 136  --  137 131*  K 5.3*  --  5.1 4.9  CL 109  --  109 103  CO2 17*  --  19* 19*  GLUCOSE 178*  --  133* 139*  BUN 43*  --  46* 53*  CREATININE 2.91* 2.86* 3.11* 3.27*  CALCIUM 8.6*  --  8.4* 8.5*    Liver Function Tests: Recent Labs  Lab 06/10/17 1251  AST 21  ALT 18  ALKPHOS 101  BILITOT 0.4  PROT 6.1*  ALBUMIN 2.7*   Recent Labs  Lab 06/10/17 1253  LIPASE 41   No results for input(s): AMMONIA in the last 168 hours.  CBC: Recent Labs  Lab 06/10/17 1253 06/10/17 1619  WBC 10.1 10.8*  HGB 8.1* 8.3*  HCT 24.9* 25.8*  MCV 88.1 87.6  PLT 518* 557*    Cardiac Enzymes: Recent Labs  Lab  06/10/17 1253 06/10/17 1619 06/10/17 2022 06/11/17 0004  TROPONINI 0.04* 0.04* 0.04* 0.04*    BNP: Invalid input(s): POCBNP  CBG: Recent Labs  Lab 06/11/17 1148 06/11/17 1649 06/11/17 2013 06/12/17 0733 06/12/17 1141  GLUCAP 104* 206* 193* 154* 122*    Microbiology: No results found for this or any previous visit.  Coagulation Studies: No results for input(s): LABPROT, INR in the last 72 hours.  Urinalysis: No results for input(s): COLORURINE, LABSPEC, PHURINE, GLUCOSEU, HGBUR, BILIRUBINUR, KETONESUR, PROTEINUR, UROBILINOGEN, NITRITE, LEUKOCYTESUR in the last 72 hours.  Invalid input(s): APPERANCEUR    Imaging: No results found.   Medications:    . aspirin EC  81 mg Oral Daily  . atorvastatin  80 mg Oral Daily  . B-complex with vitamin C  1 tablet Oral Daily  . buPROPion  150 mg Oral Daily  . clopidogrel  75 mg Oral Daily  . heparin  5,000 Units Subcutaneous Q8H  . hydrALAZINE  25 mg Oral Q8H  . insulin aspart  0-5 Units Subcutaneous QHS  . insulin aspart  0-9 Units Subcutaneous TID WC  . isosorbide mononitrate  60 mg Oral Daily  . metoprolol succinate  50 mg Oral Daily  . pantoprazole  40 mg Oral Daily  . sodium chloride flush  3 mL Intravenous Q12H   acetaminophen **OR** acetaminophen, diphenhydrAMINE, ondansetron **OR** ondansetron (ZOFRAN) IV  Assessment/ Plan:  Thomas [name] is a 72 y.o. white male with diabetes, hypertension, severe coronary disease, CABG 1996, peripheral arterial disease, was admitted on2/6/2019with non-STEMI  And underwent angioplasty, Chronic systolic CHF;LVEF 71-21%  1. Acute renal failure on Chronic kidney disease stage IV: baseline creatinine of 2.07, GFR of 30 on 10/2016 Chronic kidney disease secondary to hypertension, diabetes and CHF Acute renal failure from acute cardiorenal syndrome  2. Hypertension with acute exacerbation of systolic congestive heart failure. Echo with EF 35-40%.  - IV furosemide -  Discussed low salt diet, fluid restriction and daily weights with patient.   3. Diabetes mellitus type II with chronic kidney disease  Follow up with Dr. Candiss Norse 2/28 at 11am.    LOS: 2 Havish Petties 2/25/20194:00 PM

## 2017-06-12 NOTE — Progress Notes (Signed)
[place] at [place] NAME: Thomas [name]    MR#:  [id number]  DATE OF BIRTH:  January 23, 1946  SUBJECTIVE:  CHIEF COMPLAINT:   Chief Complaint  Patient presents with  . Chest Pain   No complaint. REVIEW OF SYSTEMS:  Review of Systems  Constitutional: Negative for chills, fever and malaise/fatigue.  HENT: Negative for sore throat.   Eyes: Negative for blurred vision and double vision.  Respiratory: Negative for cough, hemoptysis, shortness of breath, wheezing and stridor.   Cardiovascular: Negative for chest pain, palpitations, orthopnea and leg swelling.  Gastrointestinal: Negative for abdominal pain, blood in stool, diarrhea, melena, nausea and vomiting.  Genitourinary: Negative for dysuria, flank pain and hematuria.  Musculoskeletal: Negative for back pain and joint pain.  Skin: Negative for rash.  Neurological: Negative for dizziness, sensory change, focal weakness, seizures, loss of consciousness, weakness and headaches.  Endo/Heme/Allergies: Negative for polydipsia.  Psychiatric/Behavioral: Negative for depression. The patient is not nervous/anxious.     DRUG ALLERGIES:   Allergies  Allergen Reactions  . Ciprofloxacin Itching and Swelling    Facial swelling    VITALS:  Blood pressure (!) 150/82, pulse 76, temperature 97.6 F (36.4 C), temperature source Oral, resp. rate 17, height 5\' 9"  (1.753 m), weight 162 lb 14.4 oz (73.9 kg), SpO2 98 %. PHYSICAL EXAMINATION:  Physical Exam  Constitutional: He is oriented to person, place, and time and well-developed, well-nourished, and in no distress.  HENT:  Head: Normocephalic.  Mouth/Throat: Oropharynx is clear and moist.  Eyes: Conjunctivae and EOM are normal. Pupils are equal, round, and reactive to light. No scleral icterus.  Neck: Normal range of motion. Neck supple. No JVD present. No tracheal deviation present.  Cardiovascular: Normal rate, regular rhythm and normal heart  sounds. Exam reveals no gallop.  No murmur heard. Pulmonary/Chest: Effort normal. No respiratory distress. He has no wheezes. He has no rales.  Abdominal: Soft. Bowel sounds are normal. He exhibits no distension. There is no tenderness. There is no rebound.  Musculoskeletal: Normal range of motion. He exhibits edema. He exhibits no tenderness.  Neurological: He is alert and oriented to person, place, and time. No cranial nerve deficit.  Skin: No rash noted. No erythema.  Psychiatric: Affect normal.   LABORATORY PANEL:  Male CBC Recent Labs  Lab 06/10/17 1619  WBC 10.8*  HGB 8.3*  HCT 25.8*  PLT 557*   ------------------------------------------------------------------------------------------------------------------ Chemistries  Recent Labs  Lab 06/10/17 1251  06/12/17 0522  NA 136   < > 131*  K 5.3*   < > 4.9  CL 109   < > 103  CO2 17*   < > 19*  GLUCOSE 178*   < > 139*  BUN 43*   < > 53*  CREATININE 2.91*   < > 3.27*  CALCIUM 8.6*   < > 8.5*  AST 21  --   --   ALT 18  --   --   ALKPHOS 101  --   --   BILITOT 0.4  --   --    < > = values in this interval not displayed.   RADIOLOGY:  No results found. ASSESSMENT AND PLAN:   72 year old male with past medical history of coronary artery disease status post recent stent placement, diabetes, hypertension, hyperlipidemia, chronic kidney disease stage III, osteoarthritis, who presents to the hospital due to shortness of breath, lower extremity edema.  1. Acute on chronic systolic CHF, ejection fraction 35-40%. Patient's  recent echocardiogram showed EF of 35-40%. Decreased IV Lasix due to worsening renal function, follow I's and O's and daily weights.   2. ARF on hronic kidney disease stage III.  Worsening renal function. Hold Lasix Lasix. Follow-up BMP.  3. 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  4. Essential hypertension- started hydralazine and increased Imdur, continue  Toprol, Norvasc  5. GERD-continue Protonix.  6. Recent non-ST elevation MI status post stent placement - continue aspirin, Plavix, atorvastatin, Toprol.  7. Tobacco abuse-continue Wellbutrin.  Smoking cessation was counseled for 3-4 minutes.  Hyperkalemia.  Improved.  Elevated troponin.  Due to demanding ischemia secondary to CHF decompensation.  Continue aspirin, Plavix and Lipitor.  Anemia of chronic disease.  Stable.  Hyponatremia.  Follow-up BMP.  I discussed with Dr. Juleen China. All the records are reviewed and case discussed with Care Management/Social Worker. Management plans discussed with the patient, his wife, and they are in agreement.  CODE STATUS: Full Code  TOTAL TIME TAKING CARE OF THIS PATIENT: 36 minutes.   More than 50% of the time was spent in counseling/coordination of care: YES  POSSIBLE D/C IN 2 DAYS, DEPENDING ON CLINICAL CONDITION.   Demetrios Loll M.D on 06/12/2017 at 2:39 PM  Between 7am to 6pm - Pager - [phone]  After 6pm go to [email or web address] - [facility]

## 2017-06-12 NOTE — Progress Notes (Signed)
72 year old with PMH of CAD - with NSTEMI - s/p stent placement previous admission in February 2019, DM, HTN, HLD, CKDIII, osteoarthritis presented to the hospital with SOB and lower extremity edema.  Patient's active problem list this admission: 1 Acute on Chronic CHF, EF 35-40%.   2. ARF on CKD III 3. DM II 4. HTN 5. GERD 6. Recent NSTEMI 7. Tobacco Abuse  CHF Education:?? Educational session with patient, wife, and sister-in-law.  Patient gave permission for this RN to speak in front of wife and sister-in-law.  Patient and wife stated the nurse, Bethel Born, caring for patient yesterday sat down and explained everything to them and went through the HF packet.  They stated she did an excellent job.    Provided patient with "Living Better with Heart Failure" packet. Briefly reviewed definition of heart failure and signs and symptoms of an exacerbation.?Discussed the meaning of EF and his preliminary value as compared to normal value.??Explained to patient that HF is a chronic illness which requires self-assessment / self-management along with help from the cardiologist/PCP/HF Clinic.?? ? *Reviewed importance of and reason behind checking weight daily in the AM, after using the bathroom, but before getting dressed.?Patient has scales and are in patient's room. ? ? Reviewed the following information with patient:  *Discussed when to call the Dr= weight gain of >2-3lb overnight of 5lb in a week,  *Discussed yellow zone= call MD: weight gain of >2-3lb overnight of 5lb in a week, increased swelling, increased SOB when lying down, chest discomfort, dizziness, increased fatigue *Red Zone= call 911: struggle to breath, fainting or near fainting, significant chest pain   *Reviewed low sodium diet-provided handout of recommended and not recommended foods. Reviewed reading labels with patient. Discussed fluid intake with patient as well. Patient not currently on a fluid restriction, but advised no more than 8-8  ounces glass of fluids per day.?Note: ?Dietitian Consultation entered for diet education.    *Instructed patient to take medications as prescribed for heart failure. Explained briefly why pt is on the medications (either make you feel better, live longer or keep you out of the hospital) and discussed monitoring and side effects.  *Smoking Cessation?- Patient is a current every day smoker.  "Thinking about Quitting - Yes You Can!" informational sheet and Quit Smart brochure given and reviewed with patient.  Patient informed this RN that he is currently on medication to help him quit smoking. Wellbutrin is the medication.   *Discussed the benefits of exercise. Patient was not referred to Cardiac Rehab when admitted for NSTEMI / s/p Coronary Stents earlier this month.  Patient has seen Dr. Josefa Half in the clinic.  Patient and wife indicated that Dr. Saralyn Pilar was ordering Cardiac Rehab for patient.  Patient reports that he has never participated in Cardiac Rehab before.  Overview of Cardiac Rehab provided.  Patient interested in the program, but is concerned about the cost / co-pay. Cardiac Rehab brochure, informational letter, information on CPT billing codes and class times provided / reviewed with patient.  Patient encouraged to remain as active as possible. Encouraged walking and gradually increasing to 30 minutes per day up to 150 Minutes per week per AHA guidelines. ?  *[facility]- Explained the role of the [facility]. ?Explained to patient and significant other that the HF Clinic does not replace his PCP/cardiologist, but is an additional resource to help him manage his HF and to keep him out of the hospital. Patient and wife requested this  appointment in the [facility] be cancelled as the patient is being monitored closely by Dr. Candiss Norse, Nephrologist, for his CKD IV.  As per patient's request, the [facility] appointment has been cancelled.   ? Again, the 5 Steps to  Living Better with Heart Failure. ?Patient and wife thanked me for providing the above information. ?  Roanna Epley, RN, BSN, [facility] Cardiovascular and Pulmonary Nurse Navigator

## 2017-06-13 LAB — BASIC METABOLIC PANEL
ANION GAP: 9 (ref 5–15)
BUN: 59 mg/dL — ABNORMAL HIGH (ref 6–20)
CALCIUM: 8.4 mg/dL — AB (ref 8.9–10.3)
CO2: 20 mmol/L — AB (ref 22–32)
Chloride: 106 mmol/L (ref 101–111)
Creatinine, Ser: 3.52 mg/dL — ABNORMAL HIGH (ref 0.61–1.24)
GFR calc non Af Amer: 16 mL/min — ABNORMAL LOW (ref >60)
GFR, EST AFRICAN AMERICAN: 18 mL/min — AB (ref >60)
Glucose, Bld: 129 mg/dL — ABNORMAL HIGH (ref 65–99)
Potassium: 5.2 mmol/L — ABNORMAL HIGH (ref 3.5–5.1)
Sodium: 135 mmol/L (ref 135–145)

## 2017-06-13 LAB — GLUCOSE, CAPILLARY
GLUCOSE-CAPILLARY: 160 mg/dL — AB (ref 65–99)
GLUCOSE-CAPILLARY: 150 mg/dL — AB (ref 65–99)
Glucose-Capillary: 143 mg/dL — ABNORMAL HIGH (ref 65–99)
Glucose-Capillary: 146 mg/dL — ABNORMAL HIGH (ref 65–99)

## 2017-06-13 MED ORDER — PATIROMER SORBITEX CALCIUM 8.4 G PO PACK
8.4000 g | PACK | Freq: Every day | ORAL | Status: DC
  Administered 2017-06-13: 8.4 g via ORAL
  Filled 2017-06-13 (×2): qty 4

## 2017-06-13 MED ORDER — ZOLPIDEM TARTRATE 5 MG PO TABS: 5.0000 mg | ORAL_TABLET | Freq: Every evening | ORAL | Status: DC | PRN

## 2017-06-13 MED ORDER — BUPROPION HCL ER (SR) 150 MG PO TB12
150.0000 mg | ORAL_TABLET | Freq: Two times a day (BID) | ORAL | Status: DC
  Administered 2017-06-13 – 2017-06-14 (×2): 150 mg via ORAL
  Filled 2017-06-13 (×2): qty 1

## 2017-06-13 MED ORDER — ALPRAZOLAM 0.25 MG PO TABS
0.2500 mg | ORAL_TABLET | Freq: Three times a day (TID) | ORAL | Status: DC | PRN
  Administered 2017-06-13 (×2): 0.25 mg via ORAL
  Filled 2017-06-13 (×2): qty 1

## 2017-06-13 NOTE — Progress Notes (Signed)
[facility]  ROUNDING NOTE   Subjective:    Wife at bedside.  Furosemide held yesterday.  UOP 1440  Creatinine 3.52 (3.27)  Objective:  Vital signs in last 24 hours:  Temp:  [97.5 F (36.4 C)] 97.5 F (36.4 C) (02/26 0817) Pulse Rate:  [65-66] 65 (02/26 0817) Resp:  [18] 18 (02/25 1958) BP: (123-140)/(67-78) 140/78 (02/26 0817) SpO2:  [97 %-100 %] 98 % (02/26 0817) Weight:  [74.3 kg (163 lb 12.8 oz)] 74.3 kg (163 lb 12.8 oz) (02/26 0305)  Weight change: 0.408 kg (14.4 oz) Filed Weights   06/11/17 0410 06/12/17 0330 06/13/17 0305  Weight: 75 kg (165 lb 6.4 oz) 73.9 kg (162 lb 14.4 oz) 74.3 kg (163 lb 12.8 oz)    Intake/Output: I/O last 3 completed shifts: In: 960 [P.O.:960] Out: 2240 [Urine:2240]   Intake/Output this shift:  Total I/O In: 480 [P.O.:480] Out: 220 [Urine:220]  Physical Exam: General: NAD,   Head: Normocephalic, atraumatic. Moist oral mucosal membranes  Eyes: Anicteric, PERRL  Neck: Supple, trachea midline  Lungs:  Clear to auscultation  Heart: Regular rate and rhythm  Abdomen:  Soft, nontender,   Extremities: + peripheral edema.  Neurologic: Nonfocal, moving all four extremities  Skin: No lesions        Basic Metabolic Panel: Recent Labs  Lab 06/10/17 1251 06/10/17 1619 06/11/17 0004 06/12/17 0522 06/13/17 0502  NA 136  --  137 131* 135  K 5.3*  --  5.1 4.9 5.2*  CL 109  --  109 103 106  CO2 17*  --  19* 19* 20*  GLUCOSE 178*  --  133* 139* 129*  BUN 43*  --  46* 53* 59*  CREATININE 2.91* 2.86* 3.11* 3.27* 3.52*  CALCIUM 8.6*  --  8.4* 8.5* 8.4*    Liver Function Tests: Recent Labs  Lab 06/10/17 1251  AST 21  ALT 18  ALKPHOS 101  BILITOT 0.4  PROT 6.1*  ALBUMIN 2.7*   Recent Labs  Lab 06/10/17 1253  LIPASE 41   No results for input(s): AMMONIA in the last 168 hours.  CBC: Recent Labs  Lab 06/10/17 1253 06/10/17 1619  WBC 10.1 10.8*  HGB 8.1* 8.3*  HCT 24.9* 25.8*  MCV 88.1 87.6  PLT 518* 557*     Cardiac Enzymes: Recent Labs  Lab 06/10/17 1253 06/10/17 1619 06/10/17 2022 06/11/17 0004  TROPONINI 0.04* 0.04* 0.04* 0.04*    BNP: Invalid input(s): POCBNP  CBG: Recent Labs  Lab 06/12/17 1141 06/12/17 1707 06/12/17 2141 06/13/17 0818 06/13/17 1217  GLUCAP 122* 181* 140* 143* 160*    Microbiology: No results found for this or any previous visit.  Coagulation Studies: No results for input(s): LABPROT, INR in the last 72 hours.  Urinalysis: No results for input(s): COLORURINE, LABSPEC, PHURINE, GLUCOSEU, HGBUR, BILIRUBINUR, KETONESUR, PROTEINUR, UROBILINOGEN, NITRITE, LEUKOCYTESUR in the last 72 hours.  Invalid input(s): APPERANCEUR    Imaging: No results found.   Medications:    . aspirin EC  81 mg Oral Daily  . atorvastatin  80 mg Oral Daily  . B-complex with vitamin C  1 tablet Oral Daily  . buPROPion  150 mg Oral BID  . clopidogrel  75 mg Oral Daily  . heparin  5,000 Units Subcutaneous Q8H  . hydrALAZINE  25 mg Oral Q8H  . insulin aspart  0-5 Units Subcutaneous QHS  . insulin aspart  0-9 Units Subcutaneous TID WC  . isosorbide mononitrate  60 mg Oral Daily  .  metoprolol succinate  50 mg Oral Daily  . pantoprazole  40 mg Oral Daily  . ramelteon  8 mg Oral QHS  . sodium chloride flush  3 mL Intravenous Q12H   acetaminophen **OR** acetaminophen, ALPRAZolam, ondansetron **OR** ondansetron (ZOFRAN) IV, zolpidem  Assessment/ Plan:  [name] is a 72 y.o. white male with diabetes, hypertension, severe coronary disease, CABG 1996, peripheral arterial disease, was admitted on2/6/2019with non-STEMI  And underwent angioplasty, Chronic systolic CHF;LVEF 19-16%  1. Acute renal failure on Chronic kidney disease stage IV with proteinuria: baseline creatinine of 2.07, GFR of 30 on 10/2016 Chronic kidney disease secondary to hypertension, diabetes and CHF Acute renal failure from acute cardiorenal syndrome - Holding furosemide.   2.  Hypertension with acute exacerbation of systolic congestive heart failure. Echo with EF 35-40%.  - Holding furosemide.  - Discussed low salt diet, fluid restriction and daily weights with patient.   3. Diabetes mellitus type II with chronic kidney disease  Follow up with Dr. Candiss [name] 2/28 at 11am.    LOS: 3 Thomas [name] 2/26/20192:21 PM

## 2017-06-13 NOTE — Progress Notes (Signed)
Nutrition Education Note  RD consulted for nutrition education regarding new onset CHF.  72 y.o. white malewith diabetes, hypertension, severe coronary disease, CABG 1996, peripheral arterial disease, was admitted on2/6/2019with non-STEMIAnd underwent angioplasty,Chronic systolic CHF;LVEF 87-86%.  Pt sleeping at time of RD visit. Education was provided with pt's wife.   RD provided "Low Sodium Nutrition Therapy" handout from the Academy of [employer]etics. Reviewed patient's dietary recall. Provided examples on ways to decrease sodium intake in diet. Discouraged intake of processed foods and use of salt shaker. Encouraged fresh fruits and vegetables as well as whole grain sources of carbohydrates to maximize fiber intake.   RD discussed why it is important for patient to adhere to diet recommendations, and emphasized the role of fluids, foods to avoid, and importance of weighing self daily. Teach back method used.  RD also provided renal education.  Explained why diet restrictions are needed and provided lists of foods to limit/avoid that are high potassium, sodium, and phosphorus. Provided specific recommendations on safer alternatives of these foods. Strongly encouraged compliance of this diet.   Teach back method used.  Expect good compliance.  Body mass index is 24.19 kg/m. Pt meets criteria for normal weight based on current BMI.  Current diet order is HH/CHO, patient is consuming approximately 95% of meals at this time. Labs and medications reviewed. No further nutrition interventions warranted at this time. RD contact information provided. If additional nutrition issues arise, please re-consult RD.   Koleen Distance MS, RD, LDN Pager #- [phone] After Hours Pager: [phone]

## 2017-06-13 NOTE — Progress Notes (Signed)
[place] at [place] NAME: Dorsey Authement    MR#:  [id number]  DATE OF BIRTH:  [birth year]  SUBJECTIVE:  CHIEF COMPLAINT:   Chief Complaint  Patient presents with  . Chest Pain   No complaint. REVIEW OF SYSTEMS:  Review of Systems  Constitutional: Negative for chills, fever and malaise/fatigue.  HENT: Negative for sore throat.   Eyes: Negative for blurred vision and double vision.  Respiratory: Negative for cough, hemoptysis, shortness of breath, wheezing and stridor.   Cardiovascular: Negative for chest pain, palpitations, orthopnea and leg swelling.  Gastrointestinal: Negative for abdominal pain, blood in stool, diarrhea, melena, nausea and vomiting.  Genitourinary: Negative for dysuria, flank pain and hematuria.  Musculoskeletal: Negative for back pain and joint pain.  Skin: Negative for rash.  Neurological: Negative for dizziness, sensory change, focal weakness, seizures, loss of consciousness, weakness and headaches.  Endo/Heme/Allergies: Negative for polydipsia.  Psychiatric/Behavioral: Negative for depression. The patient is not nervous/anxious.     DRUG ALLERGIES:   Allergies  Allergen Reactions  . Ciprofloxacin Itching and Swelling    Facial swelling    VITALS:  Blood pressure 140/78, pulse 65, temperature (!) 97.5 F (36.4 C), temperature source Oral, resp. rate 18, height 5\' 9"  (1.753 m), weight 163 lb 12.8 oz (74.3 kg), SpO2 98 %. PHYSICAL EXAMINATION:  Physical Exam  Constitutional: He is oriented to person, place, and time and well-developed, well-nourished, and in no distress.  HENT:  Head: Normocephalic.  Mouth/Throat: Oropharynx is clear and moist.  Eyes: Conjunctivae and EOM are normal. Pupils are equal, round, and reactive to light. No scleral icterus.  Neck: Normal range of motion. Neck supple. No JVD present. No tracheal deviation present.  Cardiovascular: Normal rate, regular rhythm and normal heart  sounds. Exam reveals no gallop.  No murmur heard. Pulmonary/Chest: Effort normal. No respiratory distress. He has no wheezes. He has no rales.  Abdominal: Soft. Bowel sounds are normal. He exhibits no distension. There is no tenderness. There is no rebound.  Musculoskeletal: Normal range of motion. He exhibits edema. He exhibits no tenderness.  Neurological: He is alert and oriented to person, place, and time. No cranial nerve deficit.  Skin: No rash noted. No erythema.  Psychiatric: Affect normal.   LABORATORY PANEL:  Male CBC Recent Labs  Lab 06/10/17 1619  WBC 10.8*  HGB 8.3*  HCT 25.8*  PLT 557*   ------------------------------------------------------------------------------------------------------------------ Chemistries  Recent Labs  Lab 06/10/17 1251  06/13/17 0502  NA 136   < > 135  K 5.3*   < > 5.2*  CL 109   < > 106  CO2 17*   < > 20*  GLUCOSE 178*   < > 129*  BUN 43*   < > 59*  CREATININE 2.91*   < > 3.52*  CALCIUM 8.6*   < > 8.4*  AST 21  --   --   ALT 18  --   --   ALKPHOS 101  --   --   BILITOT 0.4  --   --    < > = values in this interval not displayed.   RADIOLOGY:  No results found. ASSESSMENT AND PLAN:   72 year old male with past medical history of coronary artery disease status post recent stent placement, diabetes, hypertension, hyperlipidemia, chronic kidney disease stage III, osteoarthritis, who presents to the hospital due to shortness of breath, lower extremity edema.  1. Acute on chronic systolic CHF, ejection fraction 35-40%. Patient's  recent echocardiogram showed EF of 35-40%. Hold IV Lasix due to worsening renal function, follow I's and O's and daily weights.   2. ARF on hronic kidney disease stage III.  Worsening renal function. Hold Lasix, Follow-up BMP.  3. 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  4. Essential hypertension- started hydralazine and increased Imdur, continue Toprol,  Norvasc  5. GERD-continue Protonix.  6. Recent non-ST elevation MI status post stent placement - continue aspirin, Plavix, atorvastatin, Toprol.  7. Tobacco abuse-continue Wellbutrin.  Smoking cessation was counseled for 3-4 minutes.  Hyperkalemia.  Chronically elevated K, Start Veltassa, f/u BMP.  Elevated troponin.  Due to demanding ischemia secondary to CHF decompensation.  Continue aspirin, Plavix and Lipitor.  Anemia of chronic disease.  Stable.  Hyponatremia.  Improved.  I discussed with Dr. Juleen China. All the records are reviewed and case discussed with Care Management/Social Worker. Management plans discussed with the patient, his wife, and they are in agreement.  CODE STATUS: Full Code  TOTAL TIME TAKING CARE OF THIS PATIENT: 36 minutes.   More than 50% of the time was spent in counseling/coordination of care: YES  POSSIBLE D/C IN 2 DAYS, DEPENDING ON CLINICAL CONDITION.   Demetrios Loll M.D on 06/13/2017 at 3:34 PM  Between 7am to 6pm - Pager - [phone]  After 6pm go to [email or web address] - [facility]

## 2017-06-13 NOTE — Progress Notes (Signed)
Rounded on patient.  Patient stated he is feeling anxious and restless. Patient stated he is anxious to be discharged, but Dr. Juleen China wants patient to stay one more day.  Patient's wife also whispered that he has not had a cigarette since being admitted and this is probably contributing to his restlessness.   Informed patient referral for Cardiac Rehab has been submitted and we are waiting for Dr. Saralyn Pilar to co-sign the referral.  Also, informed patient and wife he should receive a call from Cardiac Rehab in approximately one week.  Patient and wife in agreement with this plan.    Patient and wife informed this RN that they have been through all the materials on heart failure along with their daughters and do not have any questions at this time.    Patient and wife talked about their plans to attend a wedding in [place] this weekend and shared pictures of their great grand-son.    Patient and wife thanked this nurse for the above information.    Roanna Epley, RN, BSN, [facility] Nurse Navigator

## 2017-06-13 NOTE — Care Management (Signed)
Barrier- worsening renal function

## 2017-06-13 NOTE — Telephone Encounter (Signed)
Spoke with patient, and he is currently admitted into the hospital. He reports that they have given him Lasix via IV and his breathing has gotten a little better. He is hoping to go home today.

## 2017-06-13 NOTE — Plan of Care (Signed)
Pt continues renal function continues to decrease today with increasing Creatinine, and decreasing GFRs. Lower extremity edema has decreased from yesterday.

## 2017-06-14 LAB — CBC
HEMATOCRIT: 23 % — AB (ref 40.0–52.0)
Hemoglobin: 7.6 g/dL — ABNORMAL LOW (ref 13.0–18.0)
MCH: 28.6 pg (ref 26.0–34.0)
MCHC: 32.9 g/dL (ref 32.0–36.0)
MCV: 86.8 fL (ref 80.0–100.0)
Platelets: 482 10*3/uL — ABNORMAL HIGH (ref 150–440)
RBC: 2.65 MIL/uL — ABNORMAL LOW (ref 4.40–5.90)
RDW: 16 % — ABNORMAL HIGH (ref 11.5–14.5)
WBC: 9.2 10*3/uL (ref 3.8–10.6)

## 2017-06-14 LAB — BASIC METABOLIC PANEL
Anion gap: 10 (ref 5–15)
BUN: 65 mg/dL — ABNORMAL HIGH (ref 6–20)
CHLORIDE: 107 mmol/L (ref 101–111)
CO2: 18 mmol/L — AB (ref 22–32)
Calcium: 8.9 mg/dL (ref 8.9–10.3)
Creatinine, Ser: 3.63 mg/dL — ABNORMAL HIGH (ref 0.61–1.24)
GFR calc Af Amer: 18 mL/min — ABNORMAL LOW (ref >60)
GFR calc non Af Amer: 15 mL/min — ABNORMAL LOW (ref >60)
GLUCOSE: 158 mg/dL — AB (ref 65–99)
POTASSIUM: 5.3 mmol/L — AB (ref 3.5–5.1)
SODIUM: 135 mmol/L (ref 135–145)

## 2017-06-14 LAB — GLUCOSE, CAPILLARY: Glucose-Capillary: 144 mg/dL — ABNORMAL HIGH (ref 65–99)

## 2017-06-14 MED ORDER — SODIUM POLYSTYRENE SULFONATE PO POWD
30.0000 g | Freq: Once | ORAL | Status: AC
  Administered 2017-06-14: 30 g via ORAL
  Filled 2017-06-14: qty 30

## 2017-06-14 MED ORDER — HYDRALAZINE HCL 25 MG PO TABS: 25.0000 mg | ORAL_TABLET | Freq: Three times a day (TID) | ORAL | 0 refills | Status: DC

## 2017-06-14 NOTE — Discharge Instructions (Signed)
Heart healthy and ADA diet. °Smoking cessation. °

## 2017-06-14 NOTE — Care Management (Signed)
Patient and his wife deny that there are any issues with transportation that would prevent him from getting to follow up appointments.  Able to pay for his medications.  He and his wife decline any need for additional services.  Follow up appointment with nephrology and the heart failure clinic. Has acces to scales for daily weights

## 2017-06-14 NOTE — Progress Notes (Signed)
IV and tele removed. Discharge instructions given to pt. Prescriptions given to pt. A & O. Room  Air. NSR. Wife taking home. Pt reports no pain. Pt has no further concerns at this time.

## 2017-06-14 NOTE — Discharge Summary (Signed)
[place] at [place] NAME: Thomas [name]    MR#:  [id number]  DATE OF BIRTH:  1945/05/06  DATE OF ADMISSION:  06/10/2017   ADMITTING PHYSICIAN: Henreitta Leber, MD  DATE OF DISCHARGE: 06/14/2017 10:03 AM  PRIMARY CARE PHYSICIAN: Birdie Sons, MD   ADMISSION DIAGNOSIS:  Hyperkalemia [E87.5] Acute congestive heart failure, unspecified heart failure type ([place]) [I50.9] DISCHARGE DIAGNOSIS:  Active Problems:   CHF (congestive heart failure) ([place])  SECONDARY DIAGNOSIS:   Past Medical History:  Diagnosis Date  . Allergy   . Arthritis   . CAD (coronary artery disease)   . Diabetes mellitus without complication ([place])   . Erosive esophagitis   . GERD (gastroesophageal reflux disease)   . Gouty arthropathy   . History of colonic polyps   . History of kidney stones   . History of MRSA infection   . Hyperkalemia   . Hyperlipidemia   . Hypertension   . Melanoma ([place])   . Myocardial infarction ([place]) 1986  . Peripheral vascular disease ([place])   . 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 Trigger finger of left hand   . Ureteral tumor    HOSPITAL COURSE:   72 year old male with past medical history of coronary artery disease status post recent stent placement, diabetes, hypertension, hyperlipidemia, chronic kidney disease stage III, osteoarthritis, who presents to the hospital due to shortness of breath, lower extremity edema.  1. Acute on chronic systolic CHF, ejection fraction 35-40%. Patient'srecent echocardiogram showed EF of 35-40%. Hold IV Lasix due to worsening renal function, follow I's and O's and daily weights.   2. ARF on hronic kidney disease stage III.  Worsening renal function. Hold Lasix, Follow-up BMP and Dr. Candiss Norse in clinic tomorrow.  3. 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  4. Essential hypertension- started hydralazine and increased Imdur,  continue Toprol, Norvasc  5. GERD-continue Protonix.  6. Recent non-ST elevation MI status post stent placement-continue aspirin, Plavix, atorvastatin, Toprol.  7. Tobacco abuse-continue Wellbutrin.  Smoking cessation was counseled for 3-4 minutes.  Hyperkalemia.  Chronically elevated K, Start Veltassa, f/u BMP.  Elevated troponin.  Due to demanding ischemia secondary to CHF decompensation.  Continue aspirin, Plavix and Lipitor.  Anemia of chronic disease.  Stable.  Hyponatremia.  Improved.  Hyperkalemia.  Potassium 5.3.  Given Kayexalate in the follow-up BMP in Dr. Keturah Barre clinic tomorrow.  I discussed with Dr. Juleen China.  Per Dr. Juleen China, the patient can be discharged today and follow-up with Dr. Candiss Norse tomorrow. DISCHARGE CONDITIONS:  Stable, discharged to home today. CONSULTS OBTAINED:  Treatment Team:  Yolonda Kida, MD Murlean Iba, MD DRUG ALLERGIES:   Allergies  Allergen Reactions  . Ciprofloxacin Itching and Swelling    Facial swelling    DISCHARGE MEDICATIONS:   Allergies as of 06/14/2017      Reactions   Ciprofloxacin Itching, Swelling   Facial swelling       Medication List    STOP taking these medications   furosemide 20 MG tablet Commonly known as:  LASIX   glipiZIDE 10 MG 24 hr tablet Commonly known as:  GLUCOTROL XL     TAKE these medications   acetaminophen 500 MG tablet Commonly known as:  TYLENOL Take 500 mg by mouth every 8 (eight) hours as needed for mild pain or headache.   amLODipine 5 MG tablet Commonly known as:  NORVASC Take 1 tablet (  5 mg total) by mouth daily.   aspirin EC 81 MG tablet Take 81 mg by mouth daily.   atorvastatin 80 MG tablet Commonly known as:  LIPITOR TAKE ONE TABLET BY MOUTH EVERY DAY   b complex vitamins capsule Take 1 capsule by mouth daily.   buPROPion 150 MG 12 hr tablet Commonly known as:  WELLBUTRIN SR 1 tablet daily for 3 days, then 1 tablet twice daily. Stop smoking 14 days after  starting medication   clopidogrel 75 MG tablet Commonly known as:  PLAVIX Take 1 tablet (75 mg total) by mouth daily.   glucose blood test strip   hydrALAZINE 25 MG tablet Commonly known as:  APRESOLINE Take 1 tablet (25 mg total) by mouth every 8 (eight) hours.   HYDROcodone-acetaminophen 5-325 MG tablet Commonly known as:  NORCO/VICODIN Take 1 tablet by mouth every 6 (six) hours as needed for moderate pain.   hydrocortisone cream 1 % Apply 1 application topically as needed for itching.   isosorbide mononitrate 60 MG 24 hr tablet Commonly known as:  IMDUR Take 1 tablet (60 mg total) by mouth daily.   metoprolol succinate 50 MG 24 hr tablet Commonly known as:  TOPROL-XL Take 1 tablet (50 mg total) by mouth daily. Take with or immediately following a meal.   pantoprazole 40 MG tablet Commonly known as:  PROTONIX Take 1 tablet (40 mg total) by mouth daily.        DISCHARGE INSTRUCTIONS:  See AVS.  If you experience worsening of your admission symptoms, develop shortness of breath, life threatening emergency, suicidal or homicidal thoughts you must seek medical attention immediately by calling 911 or calling your MD immediately  if symptoms less severe.  You Must read complete instructions/literature along with all the possible adverse reactions/side effects for all the Medicines you take and that have been prescribed to you. Take any new Medicines after you have completely understood and accpet all the possible adverse reactions/side effects.   Please note  You were cared for by a hospitalist during your hospital stay. If you have any questions about your discharge medications or the care you received while you were in the hospital after you are discharged, you can call the unit and asked to speak with the hospitalist on call if the hospitalist that took care of you is not available. Once you are discharged, your primary care physician will handle any further medical issues.  Please note that NO REFILLS for any discharge medications will be authorized once you are discharged, as it is imperative that you return to your primary care physician (or establish a relationship with a primary care physician if you do not have one) for your aftercare needs so that they can reassess your need for medications and monitor your lab values.    On the day of Discharge:  VITAL SIGNS:  Blood pressure 136/72, pulse 69, temperature 98.6 F (37 C), temperature source Oral, resp. rate 18, height 5\' 9"  (1.753 m), weight 163 lb 11.2 oz (74.3 kg), SpO2 99 %. PHYSICAL EXAMINATION:  GENERAL:  72 y.o.-year-old patient lying in the bed with no acute distress.  EYES: Pupils equal, round, reactive to light and accommodation. No scleral icterus. Extraocular muscles intact.  HEENT: Head atraumatic, normocephalic. Oropharynx and nasopharynx clear.  NECK:  Supple, no jugular venous distention. No thyroid enlargement, no tenderness.  LUNGS: Normal breath sounds bilaterally, no wheezing, rales,rhonchi or crepitation. No use of accessory muscles of respiration.  CARDIOVASCULAR: S1, S2 normal. No  murmurs, rubs, or gallops.  ABDOMEN: Soft, non-tender, non-distended. Bowel sounds present. No organomegaly or mass.  EXTREMITIES: No cyanosis, or clubbing.  Bilateral leg edema 1/2+. NEUROLOGIC: Cranial nerves II through XII are intact. Muscle strength 5/5 in all extremities. Sensation intact. Gait not checked.  PSYCHIATRIC: The patient is alert and oriented x 3.  SKIN: No obvious rash, lesion, or ulcer.  DATA REVIEW:   CBC Recent Labs  Lab 06/14/17 0445  WBC 9.2  HGB 7.6*  HCT 23.0*  PLT 482*    Chemistries  Recent Labs  Lab 06/10/17 1251  06/14/17 0445  NA 136   < > 135  K 5.3*   < > 5.3*  CL 109   < > 107  CO2 17*   < > 18*  GLUCOSE 178*   < > 158*  BUN 43*   < > 65*  CREATININE 2.91*   < > 3.63*  CALCIUM 8.6*   < > 8.9  AST 21  --   --   ALT 18  --   --   ALKPHOS 101  --   --     BILITOT 0.4  --   --    < > = values in this interval not displayed.     Microbiology Results  No results found for this or any previous visit.  RADIOLOGY:  No results found.   Management plans discussed with the patient, his wife and they are in agreement.  CODE STATUS: Prior   TOTAL TIME TAKING CARE OF THIS PATIENT: 33 minutes.    Demetrios Loll M.D on 06/14/2017 at 3:29 PM  Between 7am to 6pm - Pager - [phone]  After 6pm go to [email or web address] - Proofreader  [facility]  Office  [phone]  CC: Primary care physician; Birdie Sons, MD   Note: This dictation was prepared with Dragon dictation along with smaller phrase technology. Any transcriptional errors that result from this process are unintentional.

## 2017-06-14 NOTE — Care Management Important Message (Signed)
Important Message  Patient Details  Name: Thomas [name] MRN: [id number] Date of Birth: March 30, 1946   Medicare Important Message Given:  Yes  Signed IM notice given   Katrina Stack, RN 06/14/2017, 9:34 AM

## 2017-06-15 ENCOUNTER — Telehealth: Payer: Self-pay

## 2017-06-15 DIAGNOSIS — N184 Chronic kidney disease, stage 4 (severe): Secondary | ICD-10-CM | POA: Diagnosis not present

## 2017-06-15 DIAGNOSIS — E1129 Type 2 diabetes mellitus with other diabetic kidney complication: Secondary | ICD-10-CM | POA: Diagnosis not present

## 2017-06-15 DIAGNOSIS — R600 Localized edema: Secondary | ICD-10-CM | POA: Diagnosis not present

## 2017-06-15 NOTE — Telephone Encounter (Signed)
Transition Care Management Follow-Up Telephone Call   Date discharged and where: [facility] on 06/14/17  How have you been since you were released from the hospital? Doing better, some SOB but not as bad. Declines any other s/s.  Any patient concerns? None.  Items Reviewed:   Meds: verified  Allergies: verified  Dietary Changes Reviewed: heart healthy, low sodium  Functional Questionnaire:  Independent-I Dependent-D  ADLs:   Dressing- I    Eating- I   Maintaining continence- I   Transferring- I   Transportation- I   Meal Prep- I   Managing Meds- I  Confirmed importance and Date/Time of follow-up visits scheduled: 06/28/17 @ 2:40 PM   Confirmed with patient if condition worsens to call PCP or go to the Emergency Dept. Patient was given office number and encouraged to call back with questions or concerns: YES

## 2017-06-19 ENCOUNTER — Ambulatory Visit: Payer: Self-pay | Admitting: Family

## 2017-06-20 ENCOUNTER — Ambulatory Visit (INDEPENDENT_AMBULATORY_CARE_PROVIDER_SITE_OTHER): Payer: Medicare Other

## 2017-06-20 ENCOUNTER — Ambulatory Visit (INDEPENDENT_AMBULATORY_CARE_PROVIDER_SITE_OTHER): Payer: Medicare Other | Admitting: Family Medicine

## 2017-06-20 VITALS — BP 136/68 | HR 76 | Temp 98.0°F | Ht 69.0 in | Wt 167.8 lb

## 2017-06-20 DIAGNOSIS — I10 Essential (primary) hypertension: Secondary | ICD-10-CM

## 2017-06-20 DIAGNOSIS — I509 Heart failure, unspecified: Secondary | ICD-10-CM | POA: Diagnosis not present

## 2017-06-20 DIAGNOSIS — N184 Chronic kidney disease, stage 4 (severe): Secondary | ICD-10-CM | POA: Diagnosis not present

## 2017-06-20 DIAGNOSIS — Z87891 Personal history of nicotine dependence: Secondary | ICD-10-CM | POA: Diagnosis not present

## 2017-06-20 DIAGNOSIS — N185 Chronic kidney disease, stage 5: Secondary | ICD-10-CM | POA: Insufficient documentation

## 2017-06-20 DIAGNOSIS — Z0000 Encounter for general adult medical examination without abnormal findings: Secondary | ICD-10-CM

## 2017-06-20 MED ORDER — PROMETHAZINE HCL 25 MG PO TABS: 25.0000 mg | ORAL_TABLET | Freq: Three times a day (TID) | ORAL | 0 refills | Status: DC | PRN

## 2017-06-20 NOTE — Progress Notes (Signed)
Subjective:   Thomas [name] is a 72 y.o. male who presents for Medicare Annual/Subsequent preventive examination.  Review of Systems:  N/A Cardiac Risk Factors include: advanced age (>10men, >53 women);diabetes mellitus;dyslipidemia;male gender;hypertension;smoking/ tobacco exposure     Objective:    Vitals: BP 136/68 (BP Location: Left Arm)   Pulse 76   Temp 98 F (36.7 C) (Oral)   Ht 5\' 9"  (1.753 m)   Wt 167 lb 12.8 oz (76.1 kg)   BMI 24.78 kg/m   Body mass index is 24.78 kg/m.  Advanced Directives 06/20/2017 06/10/2017 06/10/2017 05/25/2017 05/24/2017 12/27/2016 11/14/2016  Does Patient Have a Medical Advance Directive? No No No No No No No  Would patient like information on creating a medical advance directive? No - Patient declined No - Patient declined - No - Patient declined No - Patient declined - -    Tobacco Social History   Tobacco Use  Smoking Status Current Every Day Smoker  . Packs/day: 1.00  . Years: 51.00  . Pack years: 51.00  . Types: Cigarettes  Smokeless Tobacco Never Used  Tobacco Comment   currently in the process of quitting (had 1 cigarette yesterday)     Ready to quit: Not Answered Counseling given: Not Answered Comment: currently in the process of quitting (had 1 cigarette yesterday)   Clinical Intake:  Pre-visit preparation completed: Yes  Pain : No/denies pain Pain Score: 0-No pain     Nutritional Status: BMI of 19-24  Normal Nutritional Risks: (nausea and vomiting recently post hospital d/c) Diabetes: Yes(type 2) CBG done?: No Did pt. bring in CBG monitor from home?: No  How often do you need to have someone help you when you read instructions, pamphlets, or other written materials from your doctor or pharmacy?: 1 - Never  Interpreter Needed?: No  Information entered by :: [facility], LPN  Past Medical History:  Diagnosis Date  . Allergy   . Arthritis   . CAD (coronary artery disease)   . Diabetes mellitus without  complication ([place])   . Erosive esophagitis   . GERD (gastroesophageal reflux disease)   . Gouty arthropathy   . History of colonic polyps   . History of kidney stones   . History of MRSA infection   . Hyperkalemia   . Hyperlipidemia   . Hypertension   . Melanoma ([place])   . Myocardial infarction ([place]) 1986  . Peripheral vascular disease ([place])   . 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 Trigger finger of left hand   . Ureteral tumor    Past Surgical History:  Procedure Laterality Date  . CARDIAC CATHETERIZATION    . CATARACT EXTRACTION    . CORONARY ARTERY BYPASS GRAFT  1996  . CORONARY STENT INTERVENTION N/A 05/29/2017   Procedure: CORONARY STENT INTERVENTION;  Surgeon: Isaias Cowman, MD;  Location: [place] [facility] [facility];  Service: Cardiovascular;  Laterality: N/A;  . EXCISION Ureteral Tumor    . EYE SURGERY Bilateral    Cataract Extraction with IOL  . LEFT HEART CATH AND CORONARY ANGIOGRAPHY N/A 10/18/2016   Procedure: Left Heart Cath and Coronary Angiography;  Surgeon: Isaias Cowman, MD;  Location: [place] [facility];  Service: Cardiovascular;  Laterality: N/A;  . LEFT HEART CATH AND CORONARY ANGIOGRAPHY N/A 05/29/2017   Procedure: LEFT HEART CATH AND CORONARY ANGIOGRAPHY;  Surgeon: Isaias Cowman, MD;  Location: [facility];  Service: Cardiovascular;  Laterality: N/A;  . LOWER EXTREMITY ANGIOGRAPHY Right  10/31/2016   Procedure: Lower Extremity Angiography;  Surgeon: Algernon Huxley, MD;  Location: [facility];  Service: Cardiovascular;  Laterality: Right;  . LOWER EXTREMITY ANGIOGRAPHY Left 11/14/2016   Procedure: Lower Extremity Angiography;  Surgeon: Algernon Huxley, MD;  Location: [place] [facility];  Service: Cardiovascular;  Laterality: Left;  . LOWER EXTREMITY INTERVENTION  11/14/2016   Procedure: Lower Extremity Intervention;  Surgeon: Algernon Huxley, MD;  Location: [facility];  Service: Cardiovascular;;  . RETINAL  DETACHMENT SURGERY Right November 2107   Family History  Problem Relation Age of Onset  . Alzheimer's disease Mother   . Alzheimer's disease Brother   . Lung cancer Paternal Grandfather    Social History   Socioeconomic History  . Marital status: Married    Spouse name: None  . Number of children: 2  . Years of education: None  . Highest education level: Some college, no degree  Social Needs  . Financial resource strain: Not hard at all  . Food insecurity - worry: Never true  . Food insecurity - inability: Never true  . Transportation needs - medical: No  . Transportation needs - non-medical: No  Occupational History  . Occupation: Retired  Tobacco Use  . Smoking status: Current Every Day Smoker    Packs/day: 1.00    Years: 51.00    Pack years: 51.00    Types: Cigarettes  . Smokeless tobacco: Never Used  . Tobacco comment: currently in the process of quitting (had 1 cigarette yesterday)  Substance and Sexual Activity  . Alcohol use: No    Alcohol/week: 0.0 oz    Frequency: Never  . Drug use: No  . Sexual activity: None  Other Topics Concern  . None  Social History Narrative  . None    Outpatient Encounter Medications as of 06/20/2017  Medication Sig  . acetaminophen (TYLENOL) 500 MG tablet Take 500 mg by mouth every 8 (eight) hours as needed for mild pain or headache.  Marland Kitchen amLODipine (NORVASC) 5 MG tablet Take 1 tablet (5 mg total) by mouth daily.  Marland Kitchen aspirin EC 81 MG tablet Take 81 mg by mouth daily.  Marland Kitchen atorvastatin (LIPITOR) 80 MG tablet TAKE ONE TABLET BY MOUTH EVERY DAY  . b complex vitamins capsule Take 1 capsule by mouth daily.  Marland Kitchen buPROPion (WELLBUTRIN SR) 150 MG 12 hr tablet 1 tablet daily for 3 days, then 1 tablet twice daily. Stop smoking 14 days after starting medication (Patient taking differently: Take 150 mg by mouth 2 (two) times daily. 1 tablet daily for 3 days, then 1 tablet twice daily. Stop smoking 14 days after starting medication)  . clopidogrel  (PLAVIX) 75 MG tablet Take 1 tablet (75 mg total) by mouth daily.  . furosemide (LASIX) 40 MG tablet Take 40 mg by mouth 2 (two) times daily.  Marland Kitchen glucose blood test strip   . hydrALAZINE (APRESOLINE) 25 MG tablet Take 1 tablet (25 mg total) by mouth every 8 (eight) hours.  Marland Kitchen HYDROcodone-acetaminophen (NORCO/VICODIN) 5-325 MG tablet Take 1 tablet by mouth every 6 (six) hours as needed for moderate pain.  . hydrocortisone cream 1 % Apply 1 application topically as needed for itching.  . isosorbide mononitrate (IMDUR) 60 MG 24 hr tablet Take 1 tablet (60 mg total) by mouth daily.  . metoprolol succinate (TOPROL-XL) 50 MG 24 hr tablet Take 1 tablet (50 mg total) by mouth daily. Take with or immediately following a meal.  . pantoprazole (PROTONIX) 40  MG tablet Take 1 tablet (40 mg total) by mouth daily.   No facility-administered encounter medications on file as of 06/20/2017.     Activities of Daily Living In your present state of health, do you have any difficulty performing the following activities: 06/20/2017 06/10/2017  Hearing? N N  Vision? N N  Difficulty concentrating or making decisions? N N  Walking or climbing stairs? Y N  Comment Pain due to recent sx. -  Dressing or bathing? N N  Doing errands, shopping? N N  Preparing Food and eating ? N -  Using the Toilet? N -  In the past six months, have you accidently leaked urine? N -  Do you have problems with loss of bowel control? N -  Managing your Medications? N -  Managing your Finances? N -  Housekeeping or managing your Housekeeping? N -  Some recent data might be hidden    Patient Care Team: Birdie Sons, MD as PCP - General (Family Medicine) Isaias Cowman, MD as Consulting Physician (Cardiology) Jannet Mantis, MD as Consulting Physician (Dermatology) Anell Barr, OD as Consulting Physician (Optometry) Murlean Iba, MD as Consulting Physician (Internal Medicine) Lucky Cowboy Erskine Squibb, MD as Referring Physician  (Vascular Surgery)   Assessment:   This is a routine wellness examination for Thomas [name].  Exercise Activities and Dietary recommendations Current Exercise Habits: The patient does not participate in regular exercise at present, Exercise limited by: Other - see comments(post sx)  Goals    . DIET - REDUCE SODIUM INTAKE     Recommend current diet regimen of eating low sodium heart healthy foods.        Fall Risk Fall Risk  06/20/2017 06/07/2017 06/03/2016 11/24/2014  Falls in the past year? No No No No   Is the patient's home free of loose throw rugs in walkways, pet beds, electrical cords, etc?   yes      Grab bars in the bathroom? yes      Handrails on the stairs?   no      Adequate lighting?   yes  Timed Get Up and Go Performed: N/A  Depression Screen PHQ 2/9 Scores 06/20/2017 06/07/2017 06/03/2016 11/24/2014  PHQ - 2 Score 0 0 0 0  PHQ- 9 Score 0 1 - 0    Cognitive Function: Pt declined screening today.      6CIT Screen 06/03/2016  What Year? 0 points  What month? 0 points  What time? 0 points  Count back from 20 0 points  Months in reverse 0 points  Repeat phrase 6 points  Total Score 6    Immunization History  Administered Date(s) Administered  . Influenza Split 03/01/2010  . Influenza, High Dose Seasonal PF 01/31/2017  . Influenza,inj,Quad PF,6+ Mos 01/01/2013  . Influenza-Unspecified 01/16/2014  . Pneumococcal Conjugate-13 11/20/2013  . Pneumococcal Polysaccharide-23 02/24/2004, 08/26/2009  . Td 04/18/2001    Qualifies for Shingles Vaccine? Due for Shingles vaccine. Declined my offer to administer today. Education has been provided regarding the importance of this vaccine. Pt has been advised to call her insurance company to determine her out of pocket expense. Advised she may also receive this vaccine at her local pharmacy or Health Dept. Verbalized acceptance and understanding.  Screening Tests Health Maintenance  Topic Date Due  . TETANUS/TDAP  04/18/2026  (Originally 04/19/2011)  . HEMOGLOBIN A1C  08/01/2017  . FOOT EXAM  09/30/2017  . OPHTHALMOLOGY EXAM  01/20/2018  . COLONOSCOPY  03/04/2019  . INFLUENZA VACCINE  Completed  . Hepatitis C Screening  Completed  . PNA vac Low Risk Adult  Completed   Cancer Screenings: Lung: Low Dose CT Chest recommended if Age 83-80 years, 30 pack-year currently smoking OR have quit w/in 15years. Patient does qualify, however declines the scan this year.  Colorectal: Up to date  Additional Screenings:  Hepatitis C Screening: Up to date    Plan:  I have personally reviewed and addressed the Medicare Annual Wellness questionnaire and have noted the following in the patient's chart:  A. Medical and social history B. Use of alcohol, tobacco or illicit drugs  C. Current medications and supplements D. Functional ability and status E.  Nutritional status F.  Physical activity G. Advance directives H. List of other physicians I.  Hospitalizations, surgeries, and ER visits in previous 12 months J.  Portal such as hearing and vision if needed, cognitive and depression L. Referrals and appointments - none  In addition, I have reviewed and discussed with patient certain preventive protocols, quality metrics, and best practice recommendations. A written personalized care plan for preventive services as well as general preventive health recommendations were provided to patient.  See attached scanned questionnaire for additional information.   Signed,  Fabio Neighbors, LPN Nurse Health Advisor   Nurse Recommendations: Pt declined the tetanus vaccine and CT order today.

## 2017-06-20 NOTE — Progress Notes (Addendum)
Patient: Thomas [name] Male    DOB: 27-Mar-1946   72 y.o.   MRN: [id number] Visit Date: 06/20/2017  Today's Provider: Lelon Huh, MD   Chief Complaint  Patient presents with  . Hospitalization Follow-up   Subjective:    HPI   Patient saw [name] for AWV this morning at 8:30 am.   Follow up Hospitalization  Patient was admitted to [facility] on 06/10/2017 and discharged on 06/14/2017. He was treated for CHF and hyperkalemia . Treatment for this included; lasix put on hold. Patient was to follow-up with Dr. Candiss Norse 1 day after discharge from hospital. Glipizide put on hold. Started hydralazine and increased Imdur. Continue Toporol and Norvasc. Patient started on Veltassa for hyperkalemia. Follow-up BnP. Telephone follow up was done on 06/15/2017 He reports good compliance with treatment. He reports this condition is Improved. Patient reports he still has some swelling in his lower legs and feet. He also has a dry cough along with shortness of breath during exertion. Patient has not followed up with the heart failure clinic since being discharged from the hospital. He reports that he did see Dr. Candiss Norse on 06/15/2017.  ------------------------------------------------------------------------------------   Wt Readings from Last 3 Encounters:  06/20/17 167 lb 12.8 oz (76.1 kg)  06/14/17 163 lb 11.2 oz (74.3 kg)  06/07/17 175 lb (79.4 kg)     Allergies  Allergen Reactions  . Ciprofloxacin Itching and Swelling    Facial swelling      Current Outpatient Medications:  .  acetaminophen (TYLENOL) 500 MG tablet, Take 500 mg by mouth every 8 (eight) hours as needed for mild pain or headache., Disp: , Rfl:  .  amLODipine (NORVASC) 5 MG tablet, Take 1 tablet (5 mg total) by mouth daily., Disp: 30 tablet, Rfl: 0 .  aspirin EC 81 MG tablet, Take 81 mg by mouth daily., Disp: , Rfl:  .  atorvastatin (LIPITOR) 80 MG tablet, TAKE ONE TABLET BY MOUTH EVERY DAY, Disp: 30 tablet, Rfl: 5 .  b  complex vitamins capsule, Take 1 capsule by mouth daily., Disp: , Rfl:  .  buPROPion (WELLBUTRIN SR) 150 MG 12 hr tablet, 1 tablet daily for 3 days, then 1 tablet twice daily. Stop smoking 14 days after starting medication (Patient taking differently: Take 150 mg by mouth 2 (two) times daily. 1 tablet daily for 3 days, then 1 tablet twice daily. Stop smoking 14 days after starting medication), Disp: 60 tablet, Rfl: 5 .  clopidogrel (PLAVIX) 75 MG tablet, Take 1 tablet (75 mg total) by mouth daily., Disp: 30 tablet, Rfl: 1 .  furosemide (LASIX) 40 MG tablet, Take 40 mg by mouth 2 (two) times daily., Disp: , Rfl:  .  glucose blood test strip, , Disp: , Rfl:  .  hydrALAZINE (APRESOLINE) 25 MG tablet, Take 1 tablet (25 mg total) by mouth every 8 (eight) hours., Disp: 90 tablet, Rfl: 0 .  HYDROcodone-acetaminophen (NORCO/VICODIN) 5-325 MG tablet, Take 1 tablet by mouth every 6 (six) hours as needed for moderate pain., Disp: 20 tablet, Rfl: 0 .  hydrocortisone cream 1 %, Apply 1 application topically as needed for itching., Disp: , Rfl:  .  isosorbide mononitrate (IMDUR) 60 MG 24 hr tablet, Take 1 tablet (60 mg total) by mouth daily., Disp: 30 tablet, Rfl: 0 .  metoprolol succinate (TOPROL-XL) 50 MG 24 hr tablet, Take 1 tablet (50 mg total) by mouth daily. Take with or immediately following a meal., Disp: 30 tablet, Rfl: 0 .  pantoprazole (PROTONIX) 40 MG tablet, Take 1 tablet (40 mg total) by mouth daily., Disp: 30 tablet, Rfl: 12  Review of Systems  Constitutional: Positive for appetite change and fatigue. Negative for chills and fever.  HENT: Positive for sinus pressure.   Respiratory: Positive for cough (dry) and shortness of breath (during exertion). Negative for chest tightness and wheezing.   Cardiovascular: Positive for leg swelling. Negative for chest pain and palpitations.  Gastrointestinal: Positive for constipation. Negative for abdominal pain, nausea and vomiting.    Social History    Tobacco Use  . Smoking status: Current Every Day Smoker    Packs/day: 1.00    Years: 51.00    Pack years: 51.00    Types: Cigarettes  . Smokeless tobacco: Never Used  Substance Use Topics  . Alcohol use: Yes    Alcohol/week: 0.0 oz    Comment:  2 beers a month   Objective:     Most recent update: 06/20/2017 8:33 AM by Fabio Neighbors, LPN  BP  536/46 (BP Location: Left Arm)     Pulse  76     Temp  98 F (36.7 C) (Oral)     Ht  5\' 9"  (1.753 m)     Wt  167 lb 12.8 oz (76.1 kg)      BMI  24.78 kg/m       Physical Exam   General Appearance:    Alert, cooperative, no distress  Eyes:    PERRL, conjunctiva/corneas clear, EOM's intact       Lungs:     Clear to auscultation bilaterally, respirations unlabored  Heart:    Regular rate and rhythm, trace edema  Neurologic:   Awake, alert, oriented x 3. No apparent focal neurological           defect.           Assessment & Plan:     1. Congestive heart failure, unspecified HF chronicity, unspecified heart failure type ([place]) Greatly improved with increased diuretics although renal functions is worse.   2. Essential (primary) hypertension Well controlled.  Continue current medications.   3. History of smoking 30 or more pack years Doing well with bupropion. Has cut down to 2 cigarettes daily and advised to schedule a date to quit completely.   4. CKD (chronic kidney disease), stage IV ([facility]) Off ACEI and hctz, tolerating hydralazine well. Continue current medications.  Follow up renal as scheduled.        Lelon Huh, MD  [facility]

## 2017-06-20 NOTE — Patient Instructions (Signed)
[name] , Thank you for taking time to come for your Medicare Wellness Visit. I appreciate your ongoing commitment to your health goals. Please review the following plan we discussed and let me know if I can assist you in the future.   Screening recommendations/referrals: Colonoscopy: Up to date Recommended yearly ophthalmology/optometry visit for glaucoma screening and checkup Recommended yearly dental visit for hygiene and checkup  Vaccinations: Influenza vaccine: Up to date Pneumococcal vaccine: Up to date Tdap vaccine: Pt declines today.  Shingles vaccine: Pt declines today.     Advanced directives: Advance directive discussed with you today. Even though you declined this today please call our office should you change your mind and we can give you the proper paperwork for you to fill out.  Conditions/risks identified: Smoking cessation; recommend current diet regimen of eating low sodium heart healthy foods.   Next appointment: 9:00 AM today with Dr Caryn Section.   Preventive Care 60 Years and Older, Male Preventive care refers to lifestyle choices and visits with your health care provider that can promote health and wellness. What does preventive care include?  A yearly physical exam. This is also called an annual well check.  Dental exams once or twice a year.  Routine eye exams. Ask your health care provider how often you should have your eyes checked.  Personal lifestyle choices, including:  Daily care of your teeth and gums.  Regular physical activity.  Eating a healthy diet.  Avoiding tobacco and drug use.  Limiting alcohol use.  Practicing safe sex.  Taking low doses of aspirin every day.  Taking vitamin and mineral supplements as recommended by your health care provider. What happens during an annual well check? The services and screenings done by your health care provider during your annual well check will depend on your age, overall health, lifestyle risk  factors, and family history of disease. Counseling  Your health care provider may ask you questions about your:  Alcohol use.  Tobacco use.  Drug use.  Emotional well-being.  Home and relationship well-being.  Sexual activity.  Eating habits.  History of falls.  Memory and ability to understand (cognition).  Work and work Statistician. Screening  You may have the following tests or measurements:  Height, weight, and BMI.  Blood pressure.  Lipid and cholesterol levels. These may be checked every 5 years, or more frequently if you are over 59 years old.  Skin check.  Lung cancer screening. You may have this screening every year starting at age 51 if you have a 30-pack-year history of smoking and currently smoke or have quit within the past 15 years.  Fecal occult blood test (FOBT) of the stool. You may have this test every year starting at age 53.  Flexible sigmoidoscopy or colonoscopy. You may have a sigmoidoscopy every 5 years or a colonoscopy every 10 years starting at age 95.  Prostate cancer screening. Recommendations will vary depending on your family history and other risks.  Hepatitis C blood test.  Hepatitis B blood test.  Sexually transmitted disease (STD) testing.  Diabetes screening. This is done by checking your blood sugar (glucose) after you have not eaten for a while (fasting). You may have this done every 1-3 years.  Abdominal aortic aneurysm (AAA) screening. You may need this if you are a current or former smoker.  Osteoporosis. You may be screened starting at age 74 if you are at high risk. Talk with your health care provider about your test results, treatment options,  and if necessary, the need for more tests. Vaccines  Your health care provider may recommend certain vaccines, such as:  Influenza vaccine. This is recommended every year.  Tetanus, diphtheria, and acellular pertussis (Tdap, Td) vaccine. You may need a Td booster every 10  years.  Zoster vaccine. You may need this after age 14.  Pneumococcal 13-valent conjugate (PCV13) vaccine. One dose is recommended after age 1.  Pneumococcal polysaccharide (PPSV23) vaccine. One dose is recommended after age 46. Talk to your health care provider about which screenings and vaccines you need and how often you need them. This information is not intended to replace advice given to you by your health care provider. Make sure you discuss any questions you have with your health care provider. Document Released: 05/01/2015 Document Revised: 12/23/2015 Document Reviewed: 02/03/2015 Elsevier Interactive Patient Education  2017 [place] Prevention in the Home Falls can cause injuries. They can happen to people of all ages. There are many things you can do to make your home safe and to help prevent falls. What can I do on the outside of my home?  Regularly fix the edges of walkways and driveways and fix any cracks.  Remove anything that might make you trip as you walk through a door, such as a raised step or threshold.  Trim any bushes or trees on the path to your home.  Use bright outdoor lighting.  Clear any walking paths of anything that might make someone trip, such as rocks or tools.  Regularly check to see if handrails are loose or broken. Make sure that both sides of any steps have handrails.  Any raised decks and porches should have guardrails on the edges.  Have any leaves, snow, or ice cleared regularly.  Use sand or salt on walking paths during winter.  Clean up any spills in your garage right away. This includes oil or grease spills. What can I do in the bathroom?  Use night lights.  Install grab bars by the toilet and in the tub and shower. Do not use towel bars as grab bars.  Use non-skid mats or decals in the tub or shower.  If you need to sit down in the shower, use a plastic, non-slip stool.  Keep the floor dry. Clean up any water that  spills on the floor as soon as it happens.  Remove soap buildup in the tub or shower regularly.  Attach bath mats securely with double-sided non-slip rug tape.  Do not have throw rugs and other things on the floor that can make you trip. What can I do in the bedroom?  Use night lights.  Make sure that you have a light by your bed that is easy to reach.  Do not use any sheets or blankets that are too big for your bed. They should not hang down onto the floor.  Have a firm chair that has side arms. You can use this for support while you get dressed.  Do not have throw rugs and other things on the floor that can make you trip. What can I do in the kitchen?  Clean up any spills right away.  Avoid walking on wet floors.  Keep items that you use a lot in easy-to-reach places.  If you need to reach something above you, use a strong step stool that has a grab bar.  Keep electrical cords out of the way.  Do not use floor polish or wax that makes floors slippery. If  you must use wax, use non-skid floor wax.  Do not have throw rugs and other things on the floor that can make you trip. What can I do with my stairs?  Do not leave any items on the stairs.  Make sure that there are handrails on both sides of the stairs and use them. Fix handrails that are broken or loose. Make sure that handrails are as long as the stairways.  Check any carpeting to make sure that it is firmly attached to the stairs. Fix any carpet that is loose or worn.  Avoid having throw rugs at the top or bottom of the stairs. If you do have throw rugs, attach them to the floor with carpet tape.  Make sure that you have a light switch at the top of the stairs and the bottom of the stairs. If you do not have them, ask someone to add them for you. What else can I do to help prevent falls?  Wear shoes that:  Do not have high heels.  Have rubber bottoms.  Are comfortable and fit you well.  Are closed at the  toe. Do not wear sandals.  If you use a stepladder:  Make sure that it is fully opened. Do not climb a closed stepladder.  Make sure that both sides of the stepladder are locked into place.  Ask someone to hold it for you, if possible.  Clearly mark and make sure that you can see:  Any grab bars or handrails.  First and last steps.  Where the edge of each step is.  Use tools that help you move around (mobility aids) if they are needed. These include:  Canes.  Walkers.  Scooters.  Crutches.  Turn on the lights when you go into a dark area. Replace any light bulbs as soon as they burn out.  Set up your furniture so you have a clear path. Avoid moving your furniture around.  If any of your floors are uneven, fix them.  If there are any pets around you, be aware of where they are.  Review your medicines with your doctor. Some medicines can make you feel dizzy. This can increase your chance of falling. Ask your doctor what other things that you can do to help prevent falls. This information is not intended to replace advice given to you by your health care provider. Make sure you discuss any questions you have with your health care provider. Document Released: 01/29/2009 Document Revised: 09/10/2015 Document Reviewed: 05/09/2014 Elsevier Interactive Patient Education  2017 [employer].

## 2017-06-21 ENCOUNTER — Other Ambulatory Visit: Payer: Self-pay

## 2017-06-21 NOTE — Patient Outreach (Addendum)
[facility]) Care Management  06/21/2017  Thomas [name] 11/05/1945 [id number]  EMMI: General discharge RED alert Referral date: 06/21/17 Referral reason: Taking medications: NO, Sad/ hopeless/anxious/empty; YES Day # 4  Telephone call to patient regarding EMMI general discharge. HIPAA verified with patient. Discussed EMMI general discharged program with patient.  Patient reports he is taking his medications as prescribed.  Patient reports he is not having any symptoms of sadness or anxiety. Patient reports he was in the hospital due to shortness of breath and congestive heart failure.  Patient states he is doing well. Patient reports he saw his primary MD on yesterday 06/20/17.  Patient denies any change in his current treatment plan.  Patient states he is scheduled to see his cardiologist on 06/22/17.  Patient states he is weighing himself daily, recording and adhering to a low sodium diet. Patient reports he has all of his medications and is taking his medications as prescribed.   RNCM reviewed signs/ symptoms of heart failure with patient. Advised to continue to weigh and record. Take medications as prescribed.  RNCM advised patient to notify MD of any changes in condition prior to scheduled appointment. Patient would not take 24 hour nurse call line number. Patient verbally agreed that [facility] brochure and nurse call line number be mailed to him.  RNCM verified patient aware of 911 services for urgent/ emergent needs.  PLAN: RNCM will refer patient to care management assistant to close patient due to patient being assessed and having no further needs.   Quinn Plowman RN,BSN,CCM [facility] Telephonic  [phone]

## 2017-06-22 DIAGNOSIS — I25810 Atherosclerosis of coronary artery bypass graft(s) without angina pectoris: Secondary | ICD-10-CM | POA: Diagnosis not present

## 2017-06-22 DIAGNOSIS — I10 Essential (primary) hypertension: Secondary | ICD-10-CM | POA: Diagnosis not present

## 2017-06-22 DIAGNOSIS — Z9889 Other specified postprocedural states: Secondary | ICD-10-CM | POA: Diagnosis not present

## 2017-06-22 DIAGNOSIS — I5023 Acute on chronic systolic (congestive) heart failure: Secondary | ICD-10-CM | POA: Insufficient documentation

## 2017-06-22 DIAGNOSIS — I214 Non-ST elevation (NSTEMI) myocardial infarction: Secondary | ICD-10-CM | POA: Diagnosis not present

## 2017-06-22 DIAGNOSIS — R0602 Shortness of breath: Secondary | ICD-10-CM | POA: Diagnosis not present

## 2017-06-28 ENCOUNTER — Encounter: Payer: Self-pay | Admitting: Family Medicine

## 2017-06-28 ENCOUNTER — Ambulatory Visit (INDEPENDENT_AMBULATORY_CARE_PROVIDER_SITE_OTHER): Payer: Medicare Other | Admitting: Family Medicine

## 2017-06-28 VITALS — BP 132/70 | HR 76 | Temp 97.6°F | Resp 16 | Ht 69.0 in | Wt 162.0 lb

## 2017-06-28 DIAGNOSIS — Z0000 Encounter for general adult medical examination without abnormal findings: Secondary | ICD-10-CM

## 2017-06-28 DIAGNOSIS — E1122 Type 2 diabetes mellitus with diabetic chronic kidney disease: Secondary | ICD-10-CM | POA: Diagnosis not present

## 2017-06-28 DIAGNOSIS — N183 Chronic kidney disease, stage 3 unspecified: Secondary | ICD-10-CM

## 2017-06-28 DIAGNOSIS — I519 Heart disease, unspecified: Secondary | ICD-10-CM | POA: Diagnosis not present

## 2017-06-28 DIAGNOSIS — Z125 Encounter for screening for malignant neoplasm of prostate: Secondary | ICD-10-CM | POA: Diagnosis not present

## 2017-06-28 DIAGNOSIS — E785 Hyperlipidemia, unspecified: Secondary | ICD-10-CM | POA: Diagnosis not present

## 2017-06-28 DIAGNOSIS — I10 Essential (primary) hypertension: Secondary | ICD-10-CM

## 2017-06-28 NOTE — Patient Instructions (Signed)
Preventive Care 74 Years and Older, Male Preventive care refers to lifestyle choices and visits with your health care provider that can promote health and wellness. What does preventive care include?  A yearly physical exam. This is also called an annual well check.  Dental exams once or twice a year.  Routine eye exams. Ask your health care provider how often you should have your eyes checked.  Personal lifestyle choices, including: ? Daily care of your teeth and gums. ? Regular physical activity. ? Eating a healthy diet. ? Avoiding tobacco and drug use. ? Limiting alcohol use. ? Practicing safe sex. ? Taking low doses of aspirin every day. ? Taking vitamin and mineral supplements as recommended by your health care provider. What happens during an annual well check? The services and screenings done by your health care provider during your annual well check will depend on your age, overall health, lifestyle risk factors, and family history of disease. Counseling Your health care provider may ask you questions about your:  Alcohol use.  Tobacco use.  Drug use.  Emotional well-being.  Home and relationship well-being.  Sexual activity.  Eating habits.  History of falls.  Memory and ability to understand (cognition).  Work and work environment.  Screening You may have the following tests or measurements:  Height, weight, and BMI.  Blood pressure.  Lipid and cholesterol levels. These may be checked every 5 years, or more frequently if you are over 50 years old.  Skin check.  Lung cancer screening. You may have this screening every year starting at age 74 if you have a 30-pack-year history of smoking and currently smoke or have quit within the past 15 years.  Fecal occult blood test (FOBT) of the stool. You may have this test every year starting at age 74  Flexible sigmoidoscopy or colonoscopy. You may have a sigmoidoscopy every 5 years or a colonoscopy every 10  years starting at age 74  Prostate cancer screening. Recommendations will vary depending on your family history and other risks.  Hepatitis C blood test.  Hepatitis B blood test.  Sexually transmitted disease (STD) testing.  Diabetes screening. This is done by checking your blood sugar (glucose) after you have not eaten for a while (fasting). You may have this done every 1-3 years.  Abdominal aortic aneurysm (AAA) screening. You may need this if you are a current or former smoker.  Osteoporosis. You may be screened starting at age 74 if you are at high risk.  Talk with your health care provider about your test results, treatment options, and if necessary, the need for more tests. Vaccines Your health care provider may recommend certain vaccines, such as:  Influenza vaccine. This is recommended every year.  Tetanus, diphtheria, and acellular pertussis (Tdap, Td) vaccine. You may need a Td booster every 10 years.  Varicella vaccine. You may need this if you have not been vaccinated.  Zoster vaccine. You may need this after age 60.  Measles, mumps, and rubella (MMR) vaccine. You may need at least one dose of MMR if you were born in 1957 or later. You may also need a second dose.  Pneumococcal 13-valent conjugate (PCV13) vaccine. One dose is recommended after age 74.  Pneumococcal polysaccharide (PPSV23) vaccine. One dose is recommended after age 74.  Meningococcal vaccine. You may need this if you have certain conditions.  Hepatitis A vaccine. You may need this if you have certain conditions or if you travel or work in places where you   may be exposed to hepatitis A.  Hepatitis B vaccine. You may need this if you have certain conditions or if you travel or work in places where you may be exposed to hepatitis B.  Haemophilus influenzae type b (Hib) vaccine. You may need this if you have certain risk factors.  Talk to your health care provider about which screenings and vaccines  you need and how often you need them. This information is not intended to replace advice given to you by your health care provider. Make sure you discuss any questions you have with your health care provider. Document Released: 05/01/2015 Document Revised: 12/23/2015 Document Reviewed: 02/03/2015 Elsevier Interactive Patient Education  2018 [employer]  

## 2017-06-28 NOTE — Progress Notes (Signed)
Patient: Thomas [name], Male    DOB: 1945/12/04, 72 y.o.   MRN: [id number] Visit Date: 06/28/2017  Today's Provider: Lelon Huh, MD   Chief Complaint  Patient presents with  . Annual Exam  . Hypertension  . Congestive Heart Failure  . Chronic Kidney Disease   Subjective:   Patient saw [name] for AWV on 06/20/2017.   Complete Physical Thomas [name] is a 72 y.o. male. He feels fairly well. He reports no regular exercising. He reports he is sleeping fairly well.  -----------------------------------------------------------   Hypertension, follow-up:  BP Readings from Last 3 Encounters:  06/20/17 136/68  06/14/17 136/72  06/07/17 (!) 150/78    He was last seen for hypertension 8 days ago.  BP at that visit was 136/68. Management since that visit includes; no changes. Well controlled on current medications.He reports good compliance with treatment. He is not having side effects.  He is not exercising. He is adherent to low salt diet.   Outside blood pressures are 158/74. He is experiencing dyspnea.  Patient denies chest pain, chest pressure/discomfort, claudication, exertional chest pressure/discomfort, fatigue, irregular heart beat, lower extremity edema, near-syncope, orthopnea, palpitations, paroxysmal nocturnal dyspnea, syncope and tachypnea.   Cardiovascular risk factors include advanced age (older than 38 for men, 46 for women), hypertension, male gender and sedentary lifestyle.  Use of agents associated with hypertension: NSAIDS.   ------------------------------------------------------------------------  Follow up diabetes.  Lab Results  Component Value Date   HGBA1C 7.6 01/31/2017   Doing well with current medications. No hypoglycemia. Reports has seen Dr. Ellin Mayhew for eye exam within the last year.     Review of Systems  Constitutional: Positive for activity change. Negative for appetite change, chills, fatigue and fever.  HENT: Negative for  congestion, ear pain, hearing loss, nosebleeds and trouble swallowing.   Eyes: Negative for pain and visual disturbance.  Respiratory: Positive for shortness of breath. Negative for cough and chest tightness.   Cardiovascular: Negative for chest pain, palpitations and leg swelling.  Gastrointestinal: Negative for abdominal pain, blood in stool, constipation, diarrhea, nausea and vomiting.  Endocrine: Negative for polydipsia, polyphagia and polyuria.  Genitourinary: Positive for difficulty urinating. Negative for dysuria and flank pain.  Musculoskeletal: Negative for arthralgias, back pain, joint swelling, myalgias and neck stiffness.  Skin: Negative for color change, rash and wound.  Neurological: Positive for light-headedness. Negative for dizziness, tremors, seizures, speech difficulty, weakness and headaches.  Hematological: Bruises/bleeds easily.  Psychiatric/Behavioral: Negative for behavioral problems, confusion, decreased concentration, dysphoric mood and sleep disturbance. The patient is not nervous/anxious.   All other systems reviewed and are negative.   Social History   Socioeconomic History  . Marital status: Married    Spouse name: Not on file  . Number of children: 2  . Years of education: Not on file  . Highest education level: Some college, no degree  Social Needs  . Financial resource strain: Not hard at all  . Food insecurity - worry: Never true  . Food insecurity - inability: Never true  . Transportation needs - medical: No  . Transportation needs - non-medical: No  Occupational History  . Occupation: Retired  Tobacco Use  . Smoking status: Former Smoker    Packs/day: 1.00    Years: 51.00    Pack years: 51.00    Types: Cigarettes    Last attempt to quit: 06/21/2017    Years since quitting: 0.0  . Smokeless tobacco: Never Used  . Tobacco comment: started  smoking at age [age]. Smoked for 50 years  Substance and Sexual Activity  . Alcohol use: No     Alcohol/week: 0.0 oz    Frequency: Never  . Drug use: No  . Sexual activity: Not on file  Other Topics Concern  . Not on file  Social History Narrative  . Not on file    Past Medical History:  Diagnosis Date  . Allergy   . Arthritis   . CAD (coronary artery disease)   . Diabetes mellitus without complication ([place])   . Erosive esophagitis   . GERD (gastroesophageal reflux disease)   . Gouty arthropathy   . History of colonic polyps   . History of kidney stones   . History of MRSA infection   . Hyperkalemia   . Hyperlipidemia   . Hypertension   . Melanoma ([place])   . Myocardial infarction ([place]) 1986  . Peripheral vascular disease ([place])   . 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 Trigger finger of left hand   . Ureteral tumor      Patient Active Problem List   Diagnosis Date Noted  . CKD (chronic kidney disease), stage IV ([place]) 06/20/2017  . CHF (congestive heart failure) ([place]) 06/10/2017  . Systolic dysfunction 98/33/8250  . Acute on chronic renal failure ([place]) 05/24/2017  . CAD (coronary artery disease) of artery bypass graft 10/25/2016  . Abnormal ankle brachial index (ABI) 10/13/2016  . Intermittent claudication ([place]) 09/30/2016  . PVC (premature ventricular contraction) 09/30/2016  . Hyperlipidemia 09/30/2016  . Aortic atherosclerosis ([place]) 06/17/2016  . Duodenitis 11/24/2014  . Erosive esophagitis 11/24/2014  . Personal history of methicillin resistant Staphylococcus aureus 11/24/2014  . High potassium 11/24/2014  . Melanoma of skin ([place]) 11/24/2014  . Acquired trigger finger 11/24/2014  . 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 DM (diabetes mellitus), type 2 with renal complications ([place]) 53/97/6734  . History of smoking 30 or more pack years 08/26/2009  . Allergic rhinitis 08/06/2006  . 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 History of colon polyps 05/09/2003  . 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 Essential (primary) hypertension 06/09/1995    Past Surgical History:  Procedure Laterality Date  . CARDIAC CATHETERIZATION    . CATARACT EXTRACTION    . CORONARY ARTERY BYPASS GRAFT  1996  . CORONARY STENT INTERVENTION N/A 05/29/2017   Procedure: CORONARY STENT INTERVENTION;  Surgeon: Isaias Cowman, MD;  Location: [facility];  Service: Cardiovascular;  Laterality: N/A;  . EXCISION Ureteral Tumor    . EYE SURGERY Bilateral    Cataract Extraction with IOL  . LEFT HEART CATH AND CORONARY ANGIOGRAPHY N/A 10/18/2016   Procedure: Left Heart Cath and Coronary Angiography;  Surgeon: Isaias Cowman, MD;  Location: [facility];  Service: Cardiovascular;  Laterality: N/A;  . LEFT HEART CATH AND CORONARY ANGIOGRAPHY N/A 05/29/2017   Procedure: LEFT HEART CATH AND CORONARY ANGIOGRAPHY;  Surgeon: Isaias Cowman, MD;  Location: [facility];  Service: Cardiovascular;  Laterality: N/A;  . LOWER EXTREMITY ANGIOGRAPHY Right 10/31/2016   Procedure: Lower Extremity Angiography;  Surgeon: Algernon Huxley, MD;  Location: [facility];  Service: Cardiovascular;  Laterality: Right;  . LOWER EXTREMITY ANGIOGRAPHY Left 11/14/2016   Procedure: Lower Extremity Angiography;  Surgeon: Algernon Huxley, MD;  Location: [facility];  Service: Cardiovascular;  Laterality: Left;  . LOWER EXTREMITY INTERVENTION  11/14/2016  Procedure: Lower Extremity Intervention;  Surgeon: Algernon Huxley, MD;  Location: [facility];  Service: Cardiovascular;;  . RETINAL DETACHMENT SURGERY Right November 2107    His family history includes Alzheimer's disease in his brother and mother; Lung cancer in his paternal grandfather.      Current Outpatient Medications:  .  acetaminophen (TYLENOL) 500 MG tablet, Take 500 mg by mouth every 8 (eight) hours as needed for mild pain or headache., Disp: , Rfl:  .  amLODipine (NORVASC) 5 MG tablet, Take 1 tablet (5 mg total) by  mouth daily., Disp: 30 tablet, Rfl: 0 .  aspirin EC 81 MG tablet, Take 81 mg by mouth daily., Disp: , Rfl:  .  atorvastatin (LIPITOR) 80 MG tablet, TAKE ONE TABLET BY MOUTH EVERY DAY, Disp: 30 tablet, Rfl: 5 .  b complex vitamins capsule, Take 1 capsule by mouth daily., Disp: , Rfl:  .  buPROPion (WELLBUTRIN SR) 150 MG 12 hr tablet, 1 tablet daily for 3 days, then 1 tablet twice daily. Stop smoking 14 days after starting medication (Patient taking differently: Take 150 mg by mouth 2 (two) times daily. 1 tablet daily for 3 days, then 1 tablet twice daily. Stop smoking 14 days after starting medication), Disp: 60 tablet, Rfl: 5 .  clopidogrel (PLAVIX) 75 MG tablet, Take 1 tablet (75 mg total) by mouth daily., Disp: 30 tablet, Rfl: 1 .  furosemide (LASIX) 40 MG tablet, Take 40 mg by mouth 2 (two) times daily., Disp: , Rfl:  .  glucose blood test strip, , Disp: , Rfl:  .  hydrALAZINE (APRESOLINE) 25 MG tablet, Take 1 tablet (25 mg total) by mouth every 8 (eight) hours., Disp: 90 tablet, Rfl: 0 .  HYDROcodone-acetaminophen (NORCO/VICODIN) 5-325 MG tablet, Take 1 tablet by mouth every 6 (six) hours as needed for moderate pain., Disp: 20 tablet, Rfl: 0 .  hydrocortisone cream 1 %, Apply 1 application topically as needed for itching., Disp: , Rfl:  .  isosorbide mononitrate (IMDUR) 60 MG 24 hr tablet, Take 1 tablet (60 mg total) by mouth daily., Disp: 30 tablet, Rfl: 0 .  metoprolol succinate (TOPROL-XL) 50 MG 24 hr tablet, Take 1 tablet (50 mg total) by mouth daily. Take with or immediately following a meal., Disp: 30 tablet, Rfl: 0 .  pantoprazole (PROTONIX) 40 MG tablet, Take 1 tablet (40 mg total) by mouth daily., Disp: 30 tablet, Rfl: 12 .  promethazine (PHENERGAN) 25 MG tablet, Take 1 tablet (25 mg total) by mouth every 8 (eight) hours as needed for nausea or vomiting., Disp: 20 tablet, Rfl: 0  Patient Care Team: Birdie Sons, MD as PCP - General (Family Medicine) Isaias Cowman, MD as  Consulting Physician (Cardiology) Jannet Mantis, MD as Consulting Physician (Dermatology) Anell Barr, OD as Consulting Physician (Optometry) Murlean Iba, MD as Consulting Physician (Internal Medicine) Algernon Huxley, MD as Referring Physician (Vascular Surgery)     Objective:   Vitals: BP 132/70 (BP Location: Right Arm, Cuff Size: Normal)   Pulse 76   Temp 97.6 F (36.4 C) (Oral)   Resp 16   Ht 5\' 9"  (1.753 m)   Wt 162 lb (73.5 kg)   SpO2 97% Comment: room air  BMI 23.92 kg/m   Physical Exam    General Appearance:    Alert, cooperative, no distress, appears stated age  Head:    Normocephalic, without obvious abnormality, atraumatic  Eyes:    PERRL, conjunctiva/corneas clear, EOM's intact, fundi  benign, both eyes       Ears:    Normal TM's and external ear canals, both ears  Nose:   Nares normal, septum midline, mucosa normal, no drainage   or sinus tenderness  Throat:   Lips, mucosa, and tongue normal; teeth and gums normal  Neck:   Supple, symmetrical, trachea midline, no adenopathy;       thyroid:  No enlargement/tenderness/nodules; no carotid   bruit or JVD  Back:     Symmetric, no curvature, ROM normal, no CVA tenderness  Lungs:     Clear to auscultation bilaterally, respirations unlabored  Chest wall:    No tenderness or deformity  Heart:    Regular rate and rhythm, S1 and S2 normal, no murmur, rub   or gallop  Abdomen:     Soft, non-tender, bowel sounds active all four quadrants,    no masses, no organomegaly  Genitalia:    deferred  Rectal:    deferred  Extremities:   Extremities normal, atraumatic, no cyanosis or edema  Pulses:   2+ and symmetric all extremities  Skin:   Skin color, texture, turgor normal, no rashes or lesions  Lymph nodes:   Cervical, supraclavicular, and axillary nodes normal  Neurologic:   CNII-XII intact. Normal strength, sensation and reflexes      throughout    Activities of Daily Living In your present state of  health, do you have any difficulty performing the following activities: 06/20/2017 06/10/2017  Hearing? N N  Vision? N N  Difficulty concentrating or making decisions? N N  Walking or climbing stairs? Y N  Comment Pain due to recent sx. -  Dressing or bathing? N N  Doing errands, shopping? N N  Preparing Food and eating ? N -  Using the Toilet? N -  In the past six months, have you accidently leaked urine? N -  Do you have problems with loss of bowel control? N -  Managing your Medications? N -  Managing your Finances? N -  Housekeeping or managing your Housekeeping? N -  Some recent data might be hidden    Fall Risk Assessment Fall Risk  06/20/2017 06/07/2017 06/03/2016 11/24/2014  Falls in the past year? No No No No     Depression Screen PHQ 2/9 Scores 06/20/2017 06/07/2017 06/03/2016 11/24/2014  PHQ - 2 Score 0 0 0 0  PHQ- 9 Score 0 1 - 0    Cognitive Testing - 6-CIT- Patient declined      Assessment & Plan:    Annual Physical Reviewed patient's Family Medical History Reviewed and updated list of patient's medical providers Assessment of cognitive impairment was done Assessed patient's functional ability Established a written schedule for health screening [place] Completed and Reviewed  Exercise Activities and Dietary recommendations Goals    . DIET - REDUCE SODIUM INTAKE     Recommend current diet regimen of eating low sodium heart healthy foods.        Immunization History  Administered Date(s) Administered  . Influenza Split 03/01/2010  . Influenza, High Dose Seasonal PF 01/31/2017  . Influenza,inj,Quad PF,6+ Mos 01/01/2013  . Influenza-Unspecified 01/16/2014  . Pneumococcal Conjugate-13 11/20/2013  . Pneumococcal Polysaccharide-23 02/24/2004, 08/26/2009  . Td 04/18/2001    Health Maintenance  Topic Date Due  . TETANUS/TDAP  04/18/2026 (Originally 04/19/2011)  . HEMOGLOBIN A1C  08/01/2017  . FOOT EXAM  09/30/2017  . OPHTHALMOLOGY EXAM   01/20/2018  . COLONOSCOPY  03/04/2019  . INFLUENZA VACCINE  Completed  . Hepatitis C Screening  Completed  . PNA vac Low Risk Adult  Completed     Discussed health benefits of physical activity, and encouraged him to engage in regular exercise appropriate for his age and condition.    ------------------------------------------------------------------------------------------------------------ 1. Annual physical exam Doing well  2. Essential (primary) hypertension Well controlled.  Continue current medications.   - Comprehensive metabolic panel  3. Systolic dysfunction Much better since recent hospitalization. Feels a little dizzy on increased diuretics.  - Comprehensive metabolic panel - CBC  4. Prostate cancer screening  - PSA  5. Type 2 diabetes mellitus with stage 3 chronic kidney disease, without long-term current use of insulin (HCC)  - Comprehensive metabolic panel - CBC - Hemoglobin A1c  6. Hyperlipidemia, unspecified hyperlipidemia type He is tolerating atorvastatin well with no adverse effects.   - Lipid panel    Lelon Huh, MD  [facility]

## 2017-06-29 ENCOUNTER — Encounter: Payer: Medicare Other | Attending: Cardiology | Admitting: *Deleted

## 2017-06-29 ENCOUNTER — Telehealth: Payer: Self-pay | Admitting: *Deleted

## 2017-06-29 ENCOUNTER — Encounter: Payer: Self-pay | Admitting: *Deleted

## 2017-06-29 VITALS — Ht 65.5 in | Wt 158.1 lb

## 2017-06-29 DIAGNOSIS — Z48812 Encounter for surgical aftercare following surgery on the circulatory system: Secondary | ICD-10-CM | POA: Diagnosis not present

## 2017-06-29 DIAGNOSIS — I214 Non-ST elevation (NSTEMI) myocardial infarction: Secondary | ICD-10-CM | POA: Diagnosis not present

## 2017-06-29 DIAGNOSIS — Z955 Presence of coronary angioplasty implant and graft: Secondary | ICD-10-CM | POA: Insufficient documentation

## 2017-06-29 DIAGNOSIS — Z87891 Personal history of nicotine dependence: Secondary | ICD-10-CM

## 2017-06-29 DIAGNOSIS — Z122 Encounter for screening for malignant neoplasm of respiratory organs: Secondary | ICD-10-CM

## 2017-06-29 NOTE — Telephone Encounter (Signed)
Notified patient that annual lung cancer screening low dose CT scan is due currently or will be in near future. Confirmed that patient is within the age range of 55-77, and asymptomatic, (no signs or symptoms of lung cancer). Patient denies illness that would prevent curative treatment for lung cancer if found. Verified smoking history, (former, quit 3/19, 38.25 pack year). The shared decision making visit was done 06/14/16. Patient is agreeable for CT scan being scheduled.

## 2017-06-29 NOTE — Patient Instructions (Signed)
Patient Instructions  Patient Details  Name: Thomas [name] MRN: [id number] Date of Birth: September 29, 1945 Referring Provider:  Isaias Cowman, MD  Below are your personal goals for exercise, nutrition, and risk factors. Our goal is to help you stay on track towards obtaining and maintaining these goals. We will be discussing your progress on these goals with you throughout the program.  Initial Exercise Prescription: Initial Exercise Prescription - 06/29/17 1400      Date of Initial Exercise RX and Referring Provider   Date  06/29/17    Referring Provider  Paraschos      Treadmill   MPH  1.5    Grade  0    Minutes  15    METs  2.15      Recumbant Bike   Level  1    RPM  60    Watts  9    Minutes  15    METs  2      NuStep   Level  1    SPM  80    Minutes  15    METs  2      Track   Laps  25    Minutes  15    METs  2.15      Prescription Details   Frequency (times per week)  3    Duration  Progress to 45 minutes of aerobic exercise without signs/symptoms of physical distress      Intensity   THRR 40-80% of Max Heartrate  96-130    Ratings of Perceived Exertion  11-13    Perceived Dyspnea  0-4      Resistance Training   Training Prescription  Yes    Weight  3 lb    Reps  10-15       Exercise Goals: Frequency: Be able to perform aerobic exercise two to three times per week in program working toward 2-5 days per week of home exercise.  Intensity: Work with a perceived exertion of 11 (fairly light) - 15 (hard) while following your exercise prescription.  We will make changes to your prescription with you as you progress through the program.   Duration: Be able to do 30 to 45 minutes of continuous aerobic exercise in addition to a 5 minute warm-up and a 5 minute cool-down routine.   Nutrition Goals: Your personal nutrition goals will be established when you do your nutrition analysis with the dietician.  The following are general nutrition guidelines to  follow: Cholesterol < 200mg /day Sodium < 1500mg /day Fiber: Men over 50 yrs - 30 grams per day  Personal Goals: Personal Goals and Risk Factors at Admission - 06/29/17 1338      Core Components/Risk Factors/Patient Goals on Admission    Weight Management  Yes;Weight Loss    Intervention  Weight Management: Develop a combined nutrition and exercise program designed to reach desired caloric intake, while maintaining appropriate intake of nutrient and fiber, sodium and fats, and appropriate energy expenditure required for the weight goal.;Weight Management: Provide education and appropriate resources to help participant work on and attain dietary goals.;Weight Management/Obesity: Establish reasonable short term and long term weight goals.    Admit Weight  156 lb (70.8 kg)    Goal Weight: Short Term  152 lb (68.9 kg)    Goal Weight: Long Term  150 lb (68 kg)    Expected Outcomes  Short Term: Continue to assess and modify interventions until short term weight is achieved;Long Term: Adherence to nutrition  and physical activity/exercise program aimed toward attainment of established weight goal;Weight Loss: Understanding of general recommendations for a balanced deficit meal plan, which promotes 1-2 lb weight loss per week and includes a negative energy balance of [phone] kcal/d;Understanding recommendations for meals to include 15-35% energy as protein, 25-35% energy from fat, 35-60% energy from carbohydrates, less than 200mg  of dietary cholesterol, 20-35 gm of total fiber daily;Understanding of distribution of calorie intake throughout the day with the consumption of 4-5 meals/snacks    Diabetes  Yes    Intervention  Provide education about signs/symptoms and action to take for hypo/hyperglycemia.;Provide education about proper nutrition, including hydration, and aerobic/resistive exercise prescription along with prescribed medications to achieve blood glucose in normal ranges: Fasting glucose 65-99 mg/dL     Expected Outcomes  Short Term: Participant verbalizes understanding of the signs/symptoms and immediate care of hyper/hypoglycemia, proper foot care and importance of medication, aerobic/resistive exercise and nutrition plan for blood glucose control.;Long Term: Attainment of HbA1C < 7%.    Heart Failure  Yes    Intervention  Provide a combined exercise and nutrition program that is supplemented with education, support and counseling about heart failure. Directed toward relieving symptoms such as shortness of breath, decreased exercise tolerance, and extremity edema.    Expected Outcomes  Improve functional capacity of life;Short term: Attendance in program 2-3 days a week with increased exercise capacity. Reported lower sodium intake. Reported increased fruit and vegetable intake. Reports medication compliance.;Short term: Daily weights obtained and reported for increase. Utilizing diuretic protocols set by physician.;Long term: Adoption of self-care skills and reduction of barriers for early signs and symptoms recognition and intervention leading to self-care maintenance.    Hypertension  Yes    Intervention  Provide education on lifestyle modifcations including regular physical activity/exercise, weight management, moderate sodium restriction and increased consumption of fresh fruit, vegetables, and low fat dairy, alcohol moderation, and smoking cessation.;Monitor prescription use compliance.    Expected Outcomes  Short Term: Continued assessment and intervention until BP is < 140/83mm HG in hypertensive participants. < 130/24mm HG in hypertensive participants with diabetes, heart failure or chronic kidney disease.;Long Term: Maintenance of blood pressure at goal levels.    Lipids  Yes    Intervention  Provide education and support for participant on nutrition & aerobic/resistive exercise along with prescribed medications to achieve LDL 70mg , HDL >40mg .    Expected Outcomes  Short Term: Participant  states understanding of desired cholesterol values and is compliant with medications prescribed. Participant is following exercise prescription and nutrition guidelines.;Long Term: Cholesterol controlled with medications as prescribed, with individualized exercise RX and with personalized nutrition plan. Value goals: LDL < 70mg , HDL > 40 mg.       Tobacco Use Initial Evaluation: Social History   Tobacco Use  Smoking Status Former Smoker  . Packs/day: 1.00  . Years: 51.00  . Pack years: 51.00  . Types: Cigarettes  . Last attempt to quit: 06/21/2017  . Years since quitting: 0.0  Smokeless Tobacco Never Used  Tobacco Comment   started smoking at age [age]. Smoked for 50 years    Exercise Goals and Review: Exercise Goals    Row Name 06/29/17 1437             Exercise Goals   Increase Physical Activity  Yes       Intervention  Provide advice, education, support and counseling about physical activity/exercise needs.;Develop an individualized exercise prescription for aerobic and resistive training based on initial evaluation findings, risk  stratification, comorbidities and participant's personal goals.       Expected Outcomes  Short Term: Attend rehab on a regular basis to increase amount of physical activity.;Long Term: Add in home exercise to make exercise part of routine and to increase amount of physical activity.;Long Term: Exercising regularly at least 3-5 days a week.       Increase Strength and Stamina  Yes       Intervention  Provide advice, education, support and counseling about physical activity/exercise needs.;Develop an individualized exercise prescription for aerobic and resistive training based on initial evaluation findings, risk stratification, comorbidities and participant's personal goals.       Expected Outcomes  Short Term: Increase workloads from initial exercise prescription for resistance, speed, and METs.;Short Term: Perform resistance training exercises routinely  during rehab and add in resistance training at home;Long Term: Improve cardiorespiratory fitness, muscular endurance and strength as measured by increased METs and functional capacity (6MWT)       Able to understand and use rate of perceived exertion (RPE) scale  Yes       Intervention  Provide education and explanation on how to use RPE scale       Expected Outcomes  Short Term: Able to use RPE daily in rehab to express subjective intensity level;Long Term:  Able to use RPE to guide intensity level when exercising independently       Able to understand and use Dyspnea scale  Yes       Intervention  Provide education and explanation on how to use Dyspnea scale       Expected Outcomes  Short Term: Able to use Dyspnea scale daily in rehab to express subjective sense of shortness of breath during exertion;Long Term: Able to use Dyspnea scale to guide intensity level when exercising independently       Knowledge and understanding of Target Heart Rate Range (THRR)  Yes       Intervention  Provide education and explanation of THRR including how the numbers were predicted and where they are located for reference       Expected Outcomes  Short Term: Able to state/look up THRR;Long Term: Able to use THRR to govern intensity when exercising independently;Short Term: Able to use daily as guideline for intensity in rehab       Able to check pulse independently  Yes       Intervention  Provide education and demonstration on how to check pulse in carotid and radial arteries.;Review the importance of being able to check your own pulse for safety during independent exercise       Expected Outcomes  Short Term: Able to explain why pulse checking is important during independent exercise;Long Term: Able to check pulse independently and accurately       Understanding of Exercise Prescription  Yes       Intervention  Provide education, explanation, and written materials on patient's individual exercise prescription        Expected Outcomes  Short Term: Able to explain program exercise prescription;Long Term: Able to explain home exercise prescription to exercise independently          Copy of goals given to participant.

## 2017-06-29 NOTE — Progress Notes (Signed)
Daily Session Note  Patient Details  Name: ARDIAN HABERLAND MRN: [id number] Date of Birth: [birth year] Referring Provider:     Cardiac Rehab from 06/29/2017 in [facility]  Referring Provider  Paraschos      Encounter Date: 06/29/2017  Check In: Session Check In - 06/29/17 1316      Check-In   Location  [facility]    Staff Present  Renita Papa, RN Vickki Hearing, [place], ACSM CEP, Exercise Physiologist    Supervising physician immediately available to respond to emergencies  See telemetry face sheet for immediately available ER MD    Medication changes reported      No    Fall or balance concerns reported     No    Tobacco Cessation  No Change quit 06/21/17    Warm-up and Cool-down  Performed as group-led instruction    Resistance Training Performed  Yes    VAD Patient?  No      Pain Assessment   Currently in Pain?  No/denies        Exercise Prescription Changes - 06/29/17 1300      Response to Exercise   Blood Pressure (Admit)  110/60    Blood Pressure (Exercise)  134/66    Blood Pressure (Exit)  120/76    Heart Rate (Admit)  61 bpm    Heart Rate (Exercise)  98 bpm    Heart Rate (Exit)  73 bpm    Oxygen Saturation (Admit)  98 %    Oxygen Saturation (Exit)  97 %    Rating of Perceived Exertion (Exercise)  13       Social History   Tobacco Use  Smoking Status Former Smoker  . Packs/day: 1.00  . Years: 51.00  . Pack years: 51.00  . Types: Cigarettes  . Last attempt to quit: 06/21/2017  . Years since quitting: 0.0  Smokeless Tobacco Never Used  Tobacco Comment   started smoking at age [age]. Smoked for 50 years    Goals Met:  Proper associated with RPD/PD & O2 Sat Exercise tolerated well No report of cardiac concerns or symptoms Strength training completed today  Goals Unmet:  Not Applicable  Comments: Med Review completed   Dr. Emily Filbert is Medical Director for [place] and  [facility].

## 2017-06-29 NOTE — Progress Notes (Signed)
Cardiac Individual Treatment Plan  Patient Details  Name: Thomas [name] MRN: [id number] Date of Birth: 02-20-1946 Referring Provider:     Cardiac Rehab from 06/29/2017 in [facility] Cardiac and Pulmonary Rehab  Referring Provider  Paraschos      Initial Encounter Date:    Cardiac Rehab from 06/29/2017 in [facility] Cardiac and Pulmonary Rehab  Date  06/29/17  Referring Provider  Paraschos      Visit Diagnosis: NSTEMI (non-ST elevated myocardial infarction) [facility])  Status post coronary artery stent placement  Patient's Home Medications on Admission:  Current Outpatient Medications:  .  acetaminophen (TYLENOL) 500 MG tablet, Take 500 mg by mouth every 8 (eight) hours as needed for mild pain or headache., Disp: , Rfl:  .  amLODipine (NORVASC) 5 MG tablet, Take 1 tablet (5 mg total) by mouth daily., Disp: 30 tablet, Rfl: 0 .  aspirin EC 81 MG tablet, Take 81 mg by mouth daily., Disp: , Rfl:  .  atorvastatin (LIPITOR) 80 MG tablet, TAKE ONE TABLET BY MOUTH EVERY DAY, Disp: 30 tablet, Rfl: 5 .  b complex vitamins capsule, Take 1 capsule by mouth daily., Disp: , Rfl:  .  buPROPion (WELLBUTRIN SR) 150 MG 12 hr tablet, 1 tablet daily for 3 days, then 1 tablet twice daily. Stop smoking 14 days after starting medication (Patient taking differently: Take 150 mg by mouth 2 (two) times daily. 1 tablet daily for 3 days, then 1 tablet twice daily. Stop smoking 14 days after starting medication), Disp: 60 tablet, Rfl: 5 .  clopidogrel (PLAVIX) 75 MG tablet, Take 1 tablet (75 mg total) by mouth daily., Disp: 30 tablet, Rfl: 1 .  furosemide (LASIX) 40 MG tablet, Take 40 mg by mouth 2 (two) times daily., Disp: , Rfl:  .  glipiZIDE (GLUCOTROL XL) 5 MG 24 hr tablet, Take 5 mg by mouth daily., Disp: , Rfl:  .  glucose blood test strip, , Disp: , Rfl:  .  hydrALAZINE (APRESOLINE) 25 MG tablet, Take 1 tablet (25 mg total) by mouth every 8 (eight) hours., Disp: 90 tablet, Rfl: 0 .  HYDROcodone-acetaminophen  (NORCO/VICODIN) 5-325 MG tablet, Take 1 tablet by mouth every 6 (six) hours as needed for moderate pain., Disp: 20 tablet, Rfl: 0 .  isosorbide mononitrate (IMDUR) 60 MG 24 hr tablet, Take 1 tablet (60 mg total) by mouth daily., Disp: 30 tablet, Rfl: 0 .  metoprolol succinate (TOPROL-XL) 50 MG 24 hr tablet, Take 1 tablet (50 mg total) by mouth daily. Take with or immediately following a meal., Disp: 30 tablet, Rfl: 0 .  pantoprazole (PROTONIX) 40 MG tablet, Take 1 tablet (40 mg total) by mouth daily., Disp: 30 tablet, Rfl: 12 .  ramelteon (ROZEREM) 8 MG tablet, Take 8 mg by mouth at bedtime., Disp: , Rfl:  .  hydrocortisone cream 1 %, Apply 1 application topically as needed for itching., Disp: , Rfl:  .  promethazine (PHENERGAN) 25 MG tablet, Take 1 tablet (25 mg total) by mouth every 8 (eight) hours as needed for nausea or vomiting. (Patient not taking: Reported on 06/29/2017), Disp: 20 tablet, Rfl: 0  Past Medical History: Past Medical History:  Diagnosis Date  . Allergy   . Arthritis   . CAD (coronary artery disease)   . Diabetes mellitus without complication ([place])   . Erosive esophagitis   . GERD (gastroesophageal reflux disease)   . Gouty arthropathy   . History of colonic polyps   . History of kidney stones   .  History of MRSA infection   . Hyperkalemia   . Hyperlipidemia   . Hypertension   . Melanoma ([place])   . Myocardial infarction ([place]) 1986  . Peripheral vascular disease ([place])   . 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 Trigger finger of left hand   . Ureteral tumor     Tobacco Use: Social History   Tobacco Use  Smoking Status Former Smoker  . Packs/day: 1.00  . Years: 51.00  . Pack years: 51.00  . Types: Cigarettes  . Last attempt to quit: 06/21/2017  . Years since quitting: 0.0  Smokeless Tobacco Never Used  Tobacco Comment   started smoking at age [age]. Smoked for 50 years    Labs: Recent Review Flowsheet Data    Labs for ITP Cardiac and  Pulmonary Rehab Latest Ref Rng & Units 11/24/2014 06/03/2016 08/10/2016 09/30/2016 01/31/2017   Cholestrol 100 - 199 mg/dL 205(H) 404(H) 200(H) 212(H) -   LDLCALC 0 - 99 mg/dL 101(H) Comment 101(H) 112(H) -   HDL >39 mg/dL 42 36(L) 36(L) 36(L) -   Trlycerides 0 - 149 mg/dL 311(H) 642(HH) 315(H) 322(H) -   Hemoglobin A1c - 8.8(H) 8.0 - 7.9 7.6       Exercise Target Goals: Date: 06/29/17  Exercise Program Goal: Individual exercise prescription set using results from initial 6 min walk test and THRR while considering  patient's activity barriers and safety.   Exercise Prescription Goal: Initial exercise prescription builds to 30-45 minutes a day of aerobic activity, 2-3 days per week.  Home exercise guidelines will be given to patient during program as part of exercise prescription that the participant will acknowledge.  Activity Barriers & Risk Stratification: Activity Barriers & Cardiac Risk Stratification - 06/29/17 1347      Activity Barriers & Cardiac Risk Stratification   Activity Barriers  Shortness of Breath    Cardiac Risk Stratification  Moderate       6 Minute Walk: 6 Minute Walk    Row Name 06/29/17 1424         6 Minute Walk   Distance  1070 feet     Walk Time  6 minutes     # of Rest Breaks  0     MPH  2     METS  2.4     RPE  13     Perceived Dyspnea   1     VO2 Peak  8.4     Symptoms  Yes (comment)     Comments  leg fatigue/shins "burn" 5/10     Resting HR  63 bpm     Resting BP  110/60     Resting Oxygen Saturation   98 %     Exercise Oxygen Saturation  during 6 min walk  97 %     Max Ex. HR  98 bpm     Max Ex. BP  134/66     2 Minute Post BP  120/76        Oxygen Initial Assessment:   Oxygen Re-Evaluation:   Oxygen Discharge (Final Oxygen Re-Evaluation):   Initial Exercise Prescription: Initial Exercise Prescription - 06/29/17 1400      Date of Initial Exercise RX and Referring Provider   Date  06/29/17    Referring Provider  Paraschos       Treadmill   MPH  1.5    Grade  0    Minutes  15    METs  2.15      Recumbant Bike   Level  1    RPM  60    Watts  9    Minutes  15    METs  2      NuStep   Level  1    SPM  80    Minutes  15    METs  2      Track   Laps  25    Minutes  15    METs  2.15      Prescription Details   Frequency (times per week)  3    Duration  Progress to 45 minutes of aerobic exercise without signs/symptoms of physical distress      Intensity   THRR 40-80% of Max Heartrate  96-130    Ratings of Perceived Exertion  11-13    Perceived Dyspnea  0-4      Resistance Training   Training Prescription  Yes    Weight  3 lb    Reps  10-15       Perform Capillary Blood Glucose checks as needed.  Exercise Prescription Changes: Exercise Prescription Changes    Row Name 06/29/17 1300             Response to Exercise   Blood Pressure (Admit)  110/60       Blood Pressure (Exercise)  134/66       Blood Pressure (Exit)  120/76       Heart Rate (Admit)  61 bpm       Heart Rate (Exercise)  98 bpm       Heart Rate (Exit)  73 bpm       Oxygen Saturation (Admit)  98 %       Oxygen Saturation (Exit)  97 %       Rating of Perceived Exertion (Exercise)  13          Exercise Comments:   Exercise Goals and Review: Exercise Goals    Row Name 06/29/17 1437             Exercise Goals   Increase Physical Activity  Yes       Intervention  Provide advice, education, support and counseling about physical activity/exercise needs.;Develop an individualized exercise prescription for aerobic and resistive training based on initial evaluation findings, risk stratification, comorbidities and participant's personal goals.       Expected Outcomes  Short Term: Attend rehab on a regular basis to increase amount of physical activity.;Long Term: Add in home exercise to make exercise part of routine and to increase amount of physical activity.;Long Term: Exercising regularly at least 3-5 days a week.        Increase Strength and Stamina  Yes       Intervention  Provide advice, education, support and counseling about physical activity/exercise needs.;Develop an individualized exercise prescription for aerobic and resistive training based on initial evaluation findings, risk stratification, comorbidities and participant's personal goals.       Expected Outcomes  Short Term: Increase workloads from initial exercise prescription for resistance, speed, and METs.;Short Term: Perform resistance training exercises routinely during rehab and add in resistance training at home;Long Term: Improve cardiorespiratory fitness, muscular endurance and strength as measured by increased METs and functional capacity (6MWT)       Able to understand and use rate of perceived exertion (RPE) scale  Yes       Intervention  Provide education and explanation on how to use RPE  scale       Expected Outcomes  Short Term: Able to use RPE daily in rehab to express subjective intensity level;Long Term:  Able to use RPE to guide intensity level when exercising independently       Able to understand and use Dyspnea scale  Yes       Intervention  Provide education and explanation on how to use Dyspnea scale       Expected Outcomes  Short Term: Able to use Dyspnea scale daily in rehab to express subjective sense of shortness of breath during exertion;Long Term: Able to use Dyspnea scale to guide intensity level when exercising independently       Knowledge and understanding of Target Heart Rate Range (THRR)  Yes       Intervention  Provide education and explanation of THRR including how the numbers were predicted and where they are located for reference       Expected Outcomes  Short Term: Able to state/look up THRR;Long Term: Able to use THRR to govern intensity when exercising independently;Short Term: Able to use daily as guideline for intensity in rehab       Able to check pulse independently  Yes       Intervention  Provide  education and demonstration on how to check pulse in carotid and radial arteries.;Review the importance of being able to check your own pulse for safety during independent exercise       Expected Outcomes  Short Term: Able to explain why pulse checking is important during independent exercise;Long Term: Able to check pulse independently and accurately       Understanding of Exercise Prescription  Yes       Intervention  Provide education, explanation, and written materials on patient's individual exercise prescription       Expected Outcomes  Short Term: Able to explain program exercise prescription;Long Term: Able to explain home exercise prescription to exercise independently          Exercise Goals Re-Evaluation :   Discharge Exercise Prescription (Final Exercise Prescription Changes): Exercise Prescription Changes - 06/29/17 1300      Response to Exercise   Blood Pressure (Admit)  110/60    Blood Pressure (Exercise)  134/66    Blood Pressure (Exit)  120/76    Heart Rate (Admit)  61 bpm    Heart Rate (Exercise)  98 bpm    Heart Rate (Exit)  73 bpm    Oxygen Saturation (Admit)  98 %    Oxygen Saturation (Exit)  97 %    Rating of Perceived Exertion (Exercise)  13       Nutrition:  Target Goals: Understanding of nutrition guidelines, daily intake of sodium <1519m, cholesterol <2062m calories 30% from fat and 7% or less from saturated fats, daily to have 5 or more servings of fruits and vegetables.  Biometrics: Pre Biometrics - 06/29/17 1417      Pre Biometrics   Height  5' 5.5" (1.664 m)    Weight  158 lb 1.6 oz (71.7 kg)    Waist Circumference  35 inches    Hip Circumference  36 inches    Waist to Hip Ratio  0.97 %    BMI (Calculated)  25.9    Single Leg Stand  2.6 seconds        Nutrition Therapy Plan and Nutrition Goals: Nutrition Therapy & Goals - 06/29/17 1340      Intervention Plan   Intervention  Prescribe, educate and counsel  regarding individualized  specific dietary modifications aiming towards targeted core components such as weight, hypertension, lipid management, diabetes, heart failure and other comorbidities.;Nutrition handout(s) given to patient.    Expected Outcomes  Short Term Goal: Understand basic principles of dietary content, such as calories, fat, sodium, cholesterol and nutrients.;Short Term Goal: A plan has been developed with personal nutrition goals set during dietitian appointment.;Long Term Goal: Adherence to prescribed nutrition plan.       Nutrition Assessments: Nutrition Assessments - 06/29/17 1341      MEDFICTS Scores   Pre Score  33       Nutrition Goals Re-Evaluation:   Nutrition Goals Discharge (Final Nutrition Goals Re-Evaluation):   Psychosocial: Target Goals: Acknowledge presence or absence of significant depression and/or stress, maximize coping skills, provide positive support system. Participant is able to verbalize types and ability to use techniques and skills needed for reducing stress and depression.   Initial Review & Psychosocial Screening: Initial Psych Review & Screening - 06/29/17 1341      Initial Review   Current issues with  Current Anxiety/Panic;Current Sleep Concerns wakes up almost hourly due to lasix      Family Dynamics   Good Support System?  Yes Wife, daughters, friends      Screening Interventions   Interventions  Encouraged to exercise;Provide feedback about the scores to participant;Program counselor consult;To provide support and resources with identified psychosocial needs    Expected Outcomes  Short Term goal: Utilizing psychosocial counselor, staff and physician to assist with identification of specific Stressors or current issues interfering with healing process. Setting desired goal for each stressor or current issue identified.;Long Term Goal: Stressors or current issues are controlled or eliminated.;Short Term goal: Identification and review with participant of any  Quality of Life or Depression concerns found by scoring the questionnaire.;Long Term goal: The participant improves quality of Life and PHQ9 Scores as seen by post scores and/or verbalization of changes       Quality of Life Scores:  Quality of Life - 06/29/17 1342      Quality of Life Scores   Health/Function Pre  23.63 %    Socioeconomic Pre  29.17 %    Psych/Spiritual Pre  29.14 %    Family Pre  30 %    GLOBAL Pre  26.77 %      Scores of 19 and below usually indicate a poorer quality of life in these areas.  A difference of  2-3 points is a clinically meaningful difference.  A difference of 2-3 points in the total score of the Quality of Life Index has been associated with significant improvement in overall quality of life, self-image, physical symptoms, and general health in studies assessing change in quality of life.  PHQ-9: Recent Review Flowsheet Data    Depression screen [facility] 2/9 06/29/2017 06/20/2017 06/07/2017 06/03/2016 11/24/2014   Decreased Interest 0 0 0 0 0   Down, Depressed, Hopeless 0 0 0 0 0   PHQ - 2 Score 0 0 0 0 0   Altered sleeping 3 0 0 - 0   Tired, decreased energy 2 0 1 - 0   Change in appetite 2 0 0 - 0   Feeling bad or failure about yourself  0 0 0 - 0   Trouble concentrating 0 0 0 - 0   Moving slowly or fidgety/restless 2 0 0 - 0   Suicidal thoughts 0 0 0 - 0   PHQ-9 Score 9 0 1 - 0  Difficult doing work/chores Not difficult at all Not difficult at all Not difficult at all - Not difficult at all     Interpretation of Total Score  Total Score Depression Severity:  1-4 = Minimal depression, 5-9 = Mild depression, 10-14 = Moderate depression, 15-19 = Moderately severe depression, 20-27 = Severe depression   Psychosocial Evaluation and Intervention:   Psychosocial Re-Evaluation:   Psychosocial Discharge (Final Psychosocial Re-Evaluation):   Vocational Rehabilitation: Provide vocational rehab assistance to qualifying candidates.   Vocational Rehab  Evaluation & Intervention: Vocational Rehab - 06/29/17 1346      Initial Vocational Rehab Evaluation & Intervention   Assessment shows need for Vocational Rehabilitation  No       Education: Education Goals: Education classes will be provided on a variety of topics geared toward better understanding of heart health and risk factor modification. Participant will state understanding/return demonstration of topics presented as noted by education test scores.  Learning Barriers/Preferences: Learning Barriers/Preferences - 06/29/17 1345      Learning Barriers/Preferences   Learning Barriers  Hearing    Learning Preferences  Audio;Verbal Instruction;Video       Education Topics:  AED/CPR: - Group verbal and written instruction with the use of models to demonstrate the basic use of the AED with the basic ABC's of resuscitation.   General Nutrition Guidelines/Fats and Fiber: -Group instruction provided by verbal, written material, models and posters to present the general guidelines for heart healthy nutrition. Gives an explanation and review of dietary fats and fiber.   Controlling Sodium/Reading Food Labels: -Group verbal and written material supporting the discussion of sodium use in heart healthy nutrition. Review and explanation with models, verbal and written materials for utilization of the food label.   Exercise Physiology & General Exercise Guidelines: - Group verbal and written instruction with models to review the exercise physiology of the cardiovascular system and associated critical values. Provides general exercise guidelines with specific guidelines to those with heart or lung disease.    Aerobic Exercise & Resistance Training: - Gives group verbal and written instruction on the various components of exercise. Focuses on aerobic and resistive training programs and the benefits of this training and how to safely progress through these programs..   Flexibility,  Balance, Mind/Body Relaxation: Provides group verbal/written instruction on the benefits of flexibility and balance training, including mind/body exercise modes such as yoga, pilates and tai chi.  Demonstration and skill practice provided.   Stress and Anxiety: - Provides group verbal and written instruction about the health risks of elevated stress and causes of high stress.  Discuss the correlation between heart/lung disease and anxiety and treatment options. Review healthy ways to manage with stress and anxiety.   Depression: - Provides group verbal and written instruction on the correlation between heart/lung disease and depressed mood, treatment options, and the stigmas associated with seeking treatment.   Anatomy & Physiology of the Heart: - Group verbal and written instruction and models provide basic cardiac anatomy and physiology, with the coronary electrical and arterial systems. Review of Valvular disease and Heart Failure   Cardiac Procedures: - Group verbal and written instruction to review commonly prescribed medications for heart disease. Reviews the medication, class of the drug, and side effects. Includes the steps to properly store meds and maintain the prescription regimen. (beta blockers and nitrates)   Cardiac Medications I: - Group verbal and written instruction to review commonly prescribed medications for heart disease. Reviews the medication, class of the drug, and side effects.  Includes the steps to properly store meds and maintain the prescription regimen.   Cardiac Medications II: -Group verbal and written instruction to review commonly prescribed medications for heart disease. Reviews the medication, class of the drug, and side effects. (all other drug classes)    Go Sex-Intimacy & Heart Disease, Get SMART - Goal Setting: - Group verbal and written instruction through game format to discuss heart disease and the return to sexual intimacy. Provides group  verbal and written material to discuss and apply goal setting through the application of the S.M.A.R.T. Method.   Other Matters of the Heart: - Provides group verbal, written materials and models to describe Stable Angina and Peripheral Artery. Includes description of the disease process and treatment options available to the cardiac patient.   Exercise & Equipment Safety: - Individual verbal instruction and demonstration of equipment use and safety with use of the equipment.   Cardiac Rehab from 06/29/2017 in [facility] Cardiac and Pulmonary Rehab  Date  06/29/17  Educator  [facility]  Instruction Review Code  1- Verbalizes Understanding      Infection Prevention: - Provides verbal and written material to individual with discussion of infection control including proper hand washing and proper equipment cleaning during exercise session.   Cardiac Rehab from 06/29/2017 in [facility] Cardiac and Pulmonary Rehab  Date  06/29/17  Educator  [facility]  Instruction Review Code  1- Verbalizes Understanding      Falls Prevention: - Provides verbal and written material to individual with discussion of falls prevention and safety.   Cardiac Rehab from 06/29/2017 in [facility] Cardiac and Pulmonary Rehab  Date  06/29/17  Educator  [facility]  Instruction Review Code  1- Verbalizes Understanding      Diabetes: - Individual verbal and written instruction to review signs/symptoms of diabetes, desired ranges of glucose level fasting, after meals and with exercise. Acknowledge that pre and post exercise glucose checks will be done for 3 sessions at entry of program.   Cardiac Rehab from 06/29/2017 in [facility] Cardiac and Pulmonary Rehab  Date  06/29/17  Educator  [facility]  Instruction Review Code  1- Verbalizes Understanding      Know Your Numbers and Risk Factors: -Group verbal and written instruction about important numbers in your health.  Discussion of what are risk factors and how they play a role in the disease process.  Review of  Cholesterol, Blood Pressure, Diabetes, and BMI and the role they play in your overall health.   Sleep Hygiene: -Provides group verbal and written instruction about how sleep can affect your health.  Define sleep hygiene, discuss sleep cycles and impact of sleep habits. Review good sleep hygiene tips.    Other: -Provides group and verbal instruction on various topics (see comments)   Knowledge Questionnaire Score: Knowledge Questionnaire Score - 06/29/17 1346      Knowledge Questionnaire Score   Pre Score  24/28 Correct answers reviewed with Fritz Pickerel       Core Components/Risk Factors/Patient Goals at Admission: Personal Goals and Risk Factors at Admission - 06/29/17 1338      Core Components/Risk Factors/Patient Goals on Admission    Weight Management  Yes;Weight Loss    Intervention  Weight Management: Develop a combined nutrition and exercise program designed to reach desired caloric intake, while maintaining appropriate intake of nutrient and fiber, sodium and fats, and appropriate energy expenditure required for the weight goal.;Weight Management: Provide education and appropriate resources to help participant work on and attain dietary goals.;Weight Management/Obesity: Establish reasonable  short term and long term weight goals.    Admit Weight  156 lb (70.8 kg)    Goal Weight: Short Term  152 lb (68.9 kg)    Goal Weight: Long Term  150 lb (68 kg)    Expected Outcomes  Short Term: Continue to assess and modify interventions until short term weight is achieved;Long Term: Adherence to nutrition and physical activity/exercise program aimed toward attainment of established weight goal;Weight Loss: Understanding of general recommendations for a balanced deficit meal plan, which promotes 1-2 lb weight loss per week and includes a negative energy balance of [id number] kcal/d;Understanding recommendations for meals to include 15-35% energy as protein, 25-35% energy from fat, 35-60% energy from  carbohydrates, less than 261m of dietary cholesterol, 20-35 gm of total fiber daily;Understanding of distribution of calorie intake throughout the day with the consumption of 4-5 meals/snacks    Diabetes  Yes    Intervention  Provide education about signs/symptoms and action to take for hypo/hyperglycemia.;Provide education about proper nutrition, including hydration, and aerobic/resistive exercise prescription along with prescribed medications to achieve blood glucose in normal ranges: Fasting glucose 65-99 mg/dL    Expected Outcomes  Short Term: Participant verbalizes understanding of the signs/symptoms and immediate care of hyper/hypoglycemia, proper foot care and importance of medication, aerobic/resistive exercise and nutrition plan for blood glucose control.;Long Term: Attainment of HbA1C < 7%.    Heart Failure  Yes    Intervention  Provide a combined exercise and nutrition program that is supplemented with education, support and counseling about heart failure. Directed toward relieving symptoms such as shortness of breath, decreased exercise tolerance, and extremity edema.    Expected Outcomes  Improve functional capacity of life;Short term: Attendance in program 2-3 days a week with increased exercise capacity. Reported lower sodium intake. Reported increased fruit and vegetable intake. Reports medication compliance.;Short term: Daily weights obtained and reported for increase. Utilizing diuretic protocols set by physician.;Long term: Adoption of self-care skills and reduction of barriers for early signs and symptoms recognition and intervention leading to self-care maintenance.    Hypertension  Yes    Intervention  Provide education on lifestyle modifcations including regular physical activity/exercise, weight management, moderate sodium restriction and increased consumption of fresh fruit, vegetables, and low fat dairy, alcohol moderation, and smoking cessation.;Monitor prescription use  compliance.    Expected Outcomes  Short Term: Continued assessment and intervention until BP is < 140/986mHG in hypertensive participants. < 130/8071mG in hypertensive participants with diabetes, heart failure or chronic kidney disease.;Long Term: Maintenance of blood pressure at goal levels.    Lipids  Yes    Intervention  Provide education and support for participant on nutrition & aerobic/resistive exercise along with prescribed medications to achieve LDL <60m64mDL >40mg82m Expected Outcomes  Short Term: Participant states understanding of desired cholesterol values and is compliant with medications prescribed. Participant is following exercise prescription and nutrition guidelines.;Long Term: Cholesterol controlled with medications as prescribed, with individualized exercise RX and with personalized nutrition plan. Value goals: LDL < 60mg,80m > 40 mg.       Core Components/Risk Factors/Patient Goals Review:    Core Components/Risk Factors/Patient Goals at Discharge (Final Review):    ITP Comments: ITP Comments    Row Name 06/29/17 1318           ITP Comments  Med Review completed. Initial ITP created. Diagnosis can be found in [facility]          Comments: Initial ITP

## 2017-06-30 DIAGNOSIS — N183 Chronic kidney disease, stage 3 (moderate): Secondary | ICD-10-CM | POA: Diagnosis not present

## 2017-06-30 DIAGNOSIS — E1122 Type 2 diabetes mellitus with diabetic chronic kidney disease: Secondary | ICD-10-CM | POA: Diagnosis not present

## 2017-06-30 DIAGNOSIS — K922 Gastrointestinal hemorrhage, unspecified: Secondary | ICD-10-CM | POA: Diagnosis not present

## 2017-06-30 DIAGNOSIS — I10 Essential (primary) hypertension: Secondary | ICD-10-CM | POA: Diagnosis not present

## 2017-06-30 DIAGNOSIS — Z125 Encounter for screening for malignant neoplasm of prostate: Secondary | ICD-10-CM | POA: Diagnosis not present

## 2017-06-30 DIAGNOSIS — E785 Hyperlipidemia, unspecified: Secondary | ICD-10-CM | POA: Diagnosis not present

## 2017-06-30 DIAGNOSIS — D649 Anemia, unspecified: Secondary | ICD-10-CM | POA: Diagnosis not present

## 2017-06-30 DIAGNOSIS — I519 Heart disease, unspecified: Secondary | ICD-10-CM | POA: Diagnosis not present

## 2017-06-30 DIAGNOSIS — D538 Other specified nutritional anemias: Secondary | ICD-10-CM | POA: Diagnosis not present

## 2017-07-01 LAB — PSA: Prostate Specific Ag, Serum: 1.9 ng/mL (ref 0.0–4.0)

## 2017-07-01 LAB — COMPREHENSIVE METABOLIC PANEL
A/G RATIO: 1.3 (ref 1.2–2.2)
ALBUMIN: 3.3 g/dL — AB (ref 3.5–4.8)
ALT: 16 IU/L (ref 0–44)
AST: 10 IU/L (ref 0–40)
Alkaline Phosphatase: 118 IU/L — ABNORMAL HIGH (ref 39–117)
BUN / CREAT RATIO: 16 (ref 10–24)
BUN: 57 mg/dL — ABNORMAL HIGH (ref 8–27)
Bilirubin Total: <0.2 mg/dL (ref 0.0–1.2)
CALCIUM: 9.1 mg/dL (ref 8.6–10.2)
CO2: 20 mmol/L (ref 20–29)
Chloride: 104 mmol/L (ref 96–106)
Creatinine, Ser: 3.47 mg/dL — ABNORMAL HIGH (ref 0.76–1.27)
GFR, EST AFRICAN AMERICAN: 19 mL/min/{1.73_m2} — AB (ref >59)
GFR, EST NON AFRICAN AMERICAN: 17 mL/min/{1.73_m2} — AB (ref >59)
Globulin, Total: 2.6 g/dL (ref 1.5–4.5)
Glucose: 184 mg/dL — ABNORMAL HIGH (ref 65–99)
POTASSIUM: 4.9 mmol/L (ref 3.5–5.2)
Sodium: 140 mmol/L (ref 134–144)
TOTAL PROTEIN: 5.9 g/dL — AB (ref 6.0–8.5)

## 2017-07-01 LAB — LIPID PANEL
CHOL/HDL RATIO: 3 ratio (ref 0.0–5.0)
CHOLESTEROL TOTAL: 140 mg/dL (ref 100–199)
HDL: 47 mg/dL (ref >39)
LDL Calculated: 70 mg/dL (ref 0–99)
Triglycerides: 115 mg/dL (ref 0–149)
VLDL Cholesterol Cal: 23 mg/dL (ref 5–40)

## 2017-07-01 LAB — CBC
HEMATOCRIT: 23.8 % — AB (ref 37.5–51.0)
HEMOGLOBIN: 7 g/dL — AB (ref 13.0–17.7)
MCH: 26.4 pg — AB (ref 26.6–33.0)
MCHC: 29.4 g/dL — AB (ref 31.5–35.7)
MCV: 90 fL (ref 79–97)
Platelets: 426 10*3/uL — ABNORMAL HIGH (ref 150–379)
RBC: 2.65 x10E6/uL — CL (ref 4.14–5.80)
RDW: 15.9 % — ABNORMAL HIGH (ref 12.3–15.4)
WBC: 11.8 10*3/uL — ABNORMAL HIGH (ref 3.4–10.8)

## 2017-07-01 LAB — HEMOGLOBIN A1C
ESTIMATED AVERAGE GLUCOSE: 171 mg/dL
HEMOGLOBIN A1C: 7.6 % — AB (ref 4.8–5.6)

## 2017-07-03 ENCOUNTER — Encounter: Payer: Medicare Other | Attending: Cardiology

## 2017-07-03 ENCOUNTER — Encounter: Payer: Self-pay | Admitting: Family Medicine

## 2017-07-03 ENCOUNTER — Ambulatory Visit (INDEPENDENT_AMBULATORY_CARE_PROVIDER_SITE_OTHER): Payer: Medicare Other | Admitting: Family Medicine

## 2017-07-03 VITALS — BP 120/64 | HR 71 | Temp 97.8°F | Resp 16 | Wt 158.0 lb

## 2017-07-03 DIAGNOSIS — R300 Dysuria: Secondary | ICD-10-CM | POA: Diagnosis not present

## 2017-07-03 DIAGNOSIS — Z955 Presence of coronary angioplasty implant and graft: Secondary | ICD-10-CM | POA: Insufficient documentation

## 2017-07-03 DIAGNOSIS — Z48812 Encounter for surgical aftercare following surgery on the circulatory system: Secondary | ICD-10-CM | POA: Insufficient documentation

## 2017-07-03 DIAGNOSIS — N390 Urinary tract infection, site not specified: Secondary | ICD-10-CM | POA: Diagnosis not present

## 2017-07-03 DIAGNOSIS — R319 Hematuria, unspecified: Secondary | ICD-10-CM | POA: Diagnosis not present

## 2017-07-03 DIAGNOSIS — I509 Heart failure, unspecified: Secondary | ICD-10-CM | POA: Diagnosis not present

## 2017-07-03 DIAGNOSIS — I214 Non-ST elevation (NSTEMI) myocardial infarction: Secondary | ICD-10-CM | POA: Insufficient documentation

## 2017-07-03 LAB — POCT URINALYSIS DIPSTICK
BILIRUBIN UA: NEGATIVE
GLUCOSE UA: 250
KETONES UA: NEGATIVE
Nitrite, UA: NEGATIVE
Protein, UA: 300
SPEC GRAV UA: 1.02 (ref 1.010–1.025)
Urobilinogen, UA: 0.2 E.U./dL
pH, UA: 6.5 (ref 5.0–8.0)

## 2017-07-03 MED ORDER — DOXYCYCLINE HYCLATE 100 MG PO TABS: 100.0000 mg | ORAL_TABLET | Freq: Two times a day (BID) | ORAL | 0 refills | Status: DC

## 2017-07-03 NOTE — Progress Notes (Signed)
Patient: Thomas [name] Male    DOB: [birth year]   72 y.o.   MRN: [id number] Visit Date: 07/03/2017  Today's Provider: Lelon Huh, MD   Chief Complaint  Patient presents with  . Dysuria   Subjective:    Dysuria   This is a new problem. Episode onset: 3 days ago. The problem has been gradually worsening. The quality of the pain is described as burning. There has been no fever. Associated symptoms include frequency, hesitancy and urgency. Pertinent negatives include no chills, hematuria, nausea or vomiting. Associated symptoms comments: Weak urine stream. He has tried nothing for the symptoms.  Patient reports whenever he begins to urinate it burns and started out as a dribble, then changes into a weak stream, then ends with more dribbling.         Allergies  Allergen Reactions  . Ciprofloxacin Itching and Swelling    Facial swelling      Current Outpatient Medications:  .  acetaminophen (TYLENOL) 500 MG tablet, Take 500 mg by mouth every 8 (eight) hours as needed for mild pain or headache., Disp: , Rfl:  .  amLODipine (NORVASC) 5 MG tablet, Take 1 tablet (5 mg total) by mouth daily., Disp: 30 tablet, Rfl: 0 .  aspirin EC 81 MG tablet, Take 81 mg by mouth daily., Disp: , Rfl:  .  atorvastatin (LIPITOR) 80 MG tablet, TAKE ONE TABLET BY MOUTH EVERY DAY, Disp: 30 tablet, Rfl: 5 .  b complex vitamins capsule, Take 1 capsule by mouth daily., Disp: , Rfl:  .  buPROPion (WELLBUTRIN SR) 150 MG 12 hr tablet, 1 tablet daily for 3 days, then 1 tablet twice daily. Stop smoking 14 days after starting medication (Patient taking differently: Take 150 mg by mouth 2 (two) times daily. 1 tablet daily for 3 days, then 1 tablet twice daily. Stop smoking 14 days after starting medication), Disp: 60 tablet, Rfl: 5 .  clopidogrel (PLAVIX) 75 MG tablet, Take 1 tablet (75 mg total) by mouth daily., Disp: 30 tablet, Rfl: 1 .  furosemide (LASIX) 40 MG tablet, Take 40 mg by mouth 2 (two) times  daily., Disp: , Rfl:  .  glipiZIDE (GLUCOTROL XL) 5 MG 24 hr tablet, Take 5 mg by mouth daily., Disp: , Rfl:  .  glucose blood test strip, , Disp: , Rfl:  .  hydrALAZINE (APRESOLINE) 25 MG tablet, Take 1 tablet (25 mg total) by mouth every 8 (eight) hours., Disp: 90 tablet, Rfl: 0 .  HYDROcodone-acetaminophen (NORCO/VICODIN) 5-325 MG tablet, Take 1 tablet by mouth every 6 (six) hours as needed for moderate pain., Disp: 20 tablet, Rfl: 0 .  hydrocortisone cream 1 %, Apply 1 application topically as needed for itching., Disp: , Rfl:  .  isosorbide mononitrate (IMDUR) 60 MG 24 hr tablet, Take 1 tablet (60 mg total) by mouth daily., Disp: 30 tablet, Rfl: 0 .  metoprolol succinate (TOPROL-XL) 50 MG 24 hr tablet, Take 1 tablet (50 mg total) by mouth daily. Take with or immediately following a meal., Disp: 30 tablet, Rfl: 0 .  pantoprazole (PROTONIX) 40 MG tablet, Take 1 tablet (40 mg total) by mouth daily., Disp: 30 tablet, Rfl: 12 .  promethazine (PHENERGAN) 25 MG tablet, Take 1 tablet (25 mg total) by mouth every 8 (eight) hours as needed for nausea or vomiting., Disp: 20 tablet, Rfl: 0 .  ramelteon (ROZEREM) 8 MG tablet, Take 8 mg by mouth at bedtime., Disp: , Rfl:   Review  of Systems  Constitutional: Negative for appetite change, chills and fever.  Respiratory: Negative for chest tightness, shortness of breath and wheezing.   Cardiovascular: Negative for chest pain and palpitations.  Gastrointestinal: Negative for abdominal pain, nausea and vomiting.  Genitourinary: Positive for difficulty urinating, dysuria, frequency, hesitancy and urgency. Negative for hematuria.    Social History   Tobacco Use  . Smoking status: Former Smoker    Packs/day: 1.00    Years: 51.00    Pack years: 51.00    Types: Cigarettes    Last attempt to quit: 06/21/2017    Years since quitting: 0.0  . Smokeless tobacco: Never Used  . Tobacco comment: started smoking at age [age]. Smoked for 50 years  Substance Use  Topics  . Alcohol use: No    Alcohol/week: 0.0 oz    Frequency: Never   Objective:   BP 120/64 (BP Location: Left Arm, Patient Position: Sitting, Cuff Size: Large)   Pulse 71   Temp 97.8 F (36.6 C) (Oral)   Resp 16   Wt 158 lb (71.7 kg)   SpO2 98% Comment: room air  BMI 25.89 kg/m  Vitals:   07/03/17 1138  BP: 120/64  Pulse: 71  Resp: 16  Temp: 97.8 F (36.6 C)  TempSrc: Oral  SpO2: 98%  Weight: 158 lb (71.7 kg)     Physical Exam   General Appearance:    Alert, cooperative, no distress  Eyes:    PERRL, conjunctiva/corneas clear, EOM's intact       Lungs:     Clear to auscultation bilaterally, respirations unlabored  Heart:    Regular rate and rhythm  Neurologic:   Awake, alert, oriented x 3. No apparent focal neurological           defect.       Results for orders placed or performed in visit on 07/03/17  POCT Urinalysis Dipstick  Result Value Ref Range   Color, UA yellow    Clarity, UA clear    Glucose, UA 250    Bilirubin, UA negative    Ketones, UA negative    Spec Grav, UA 1.020 1.010 - 1.025   Blood, UA Trace (Hemolyzed)    pH, UA 6.5 5.0 - 8.0   Protein, UA 300 (+++)    Urobilinogen, UA 0.2 0.2 or 1.0 E.U./dL   Nitrite, UA negative    Leukocytes, UA Trace (A) Negative   Appearance     Odor         Assessment & Plan:     1. Dysuria Cover for UTI/prostatitis with doxcycycline while awaiting culture results.  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 If sx do not resolve on abox may need referral to urology.  - POCT Urinalysis Dipstick  2. Urinary tract infection with hematuria, site unspecified  - Urine Culture  3. CHF He states urinary hesitancy is worse in the morning when he has since that his bladder is very full and uncomfortable. Advised to take second furosemide earlier in the day to try to be more fully diuresed by the time he goes to bed. He states his breathing remains much improved compared to  time prior to recent hospitalization.       Lelon Huh, MD  [facility]

## 2017-07-04 ENCOUNTER — Telehealth: Payer: Self-pay | Admitting: Family Medicine

## 2017-07-04 DIAGNOSIS — K2210 Ulcer of esophagus without bleeding: Secondary | ICD-10-CM

## 2017-07-04 MED ORDER — ISOSORBIDE MONONITRATE ER 60 MG PO TB24: 60.0000 mg | ORAL_TABLET | Freq: Every day | ORAL | 5 refills | Status: DC

## 2017-07-04 MED ORDER — METOPROLOL SUCCINATE ER 50 MG PO TB24: 50.0000 mg | ORAL_TABLET | Freq: Every day | ORAL | 5 refills | Status: DC

## 2017-07-04 NOTE — Telephone Encounter (Signed)
Please advise 

## 2017-07-04 NOTE — Telephone Encounter (Signed)
Patient states that when he was in the hospital he was put on the following medications and they have no refills and he is wanting to know if you want him to continue them.  If so he needs refills sent to [facility] in [place].   isosorbide mononitrate (IMDUR) 60 MG 24 hr tablet   metoprolol succinate (TOPROL-XL) 50 MG 24 hr tablet   pantoprazole (PROTONIX) 40 MG tablet  Please let patient know.

## 2017-07-05 DIAGNOSIS — R600 Localized edema: Secondary | ICD-10-CM | POA: Diagnosis not present

## 2017-07-05 DIAGNOSIS — E1129 Type 2 diabetes mellitus with other diabetic kidney complication: Secondary | ICD-10-CM | POA: Diagnosis not present

## 2017-07-05 DIAGNOSIS — N184 Chronic kidney disease, stage 4 (severe): Secondary | ICD-10-CM | POA: Diagnosis not present

## 2017-07-05 LAB — URINE CULTURE

## 2017-07-07 LAB — IRON: IRON: 15 ug/dL — AB (ref 38–169)

## 2017-07-07 LAB — FERRITIN: Ferritin: 17 ng/mL — ABNORMAL LOW (ref 30–400)

## 2017-07-07 LAB — VITAMIN B12: VITAMIN B 12: 1071 pg/mL (ref 232–1245)

## 2017-07-07 LAB — SPECIMEN STATUS REPORT

## 2017-07-10 ENCOUNTER — Ambulatory Visit: Admission: RE | Admit: 2017-07-10 | Payer: Medicare Other | Source: Ambulatory Visit

## 2017-07-10 ENCOUNTER — Ambulatory Visit
Admission: RE | Admit: 2017-07-10 | Discharge: 2017-07-10 | Disposition: A | Discharge status: Home / Self Care | Payer: Medicare Other | Source: Ambulatory Visit | Attending: Oncology | Admitting: Oncology

## 2017-07-10 ENCOUNTER — Emergency Department
Admission: EM | Admit: 2017-07-10 | Discharge: 2017-07-10 | Disposition: A | Discharge status: Home / Self Care | Payer: Medicare Other | Attending: Emergency Medicine | Admitting: Emergency Medicine

## 2017-07-10 ENCOUNTER — Emergency Department: Payer: Medicare Other

## 2017-07-10 ENCOUNTER — Encounter: Payer: Self-pay | Admitting: Medical Oncology

## 2017-07-10 ENCOUNTER — Encounter: Payer: Self-pay | Admitting: *Deleted

## 2017-07-10 DIAGNOSIS — I517 Cardiomegaly: Secondary | ICD-10-CM | POA: Insufficient documentation

## 2017-07-10 DIAGNOSIS — N281 Cyst of kidney, acquired: Secondary | ICD-10-CM | POA: Diagnosis not present

## 2017-07-10 DIAGNOSIS — I5022 Chronic systolic (congestive) heart failure: Secondary | ICD-10-CM | POA: Diagnosis not present

## 2017-07-10 DIAGNOSIS — R112 Nausea with vomiting, unspecified: Secondary | ICD-10-CM | POA: Diagnosis not present

## 2017-07-10 DIAGNOSIS — Z87891 Personal history of nicotine dependence: Secondary | ICD-10-CM

## 2017-07-10 DIAGNOSIS — I2510 Atherosclerotic heart disease of native coronary artery without angina pectoris: Secondary | ICD-10-CM | POA: Insufficient documentation

## 2017-07-10 DIAGNOSIS — R0602 Shortness of breath: Secondary | ICD-10-CM | POA: Diagnosis not present

## 2017-07-10 DIAGNOSIS — Z955 Presence of coronary angioplasty implant and graft: Secondary | ICD-10-CM

## 2017-07-10 DIAGNOSIS — N184 Chronic kidney disease, stage 4 (severe): Secondary | ICD-10-CM | POA: Insufficient documentation

## 2017-07-10 DIAGNOSIS — Z79899 Other long term (current) drug therapy: Secondary | ICD-10-CM | POA: Insufficient documentation

## 2017-07-10 DIAGNOSIS — Z951 Presence of aortocoronary bypass graft: Secondary | ICD-10-CM

## 2017-07-10 DIAGNOSIS — Z7902 Long term (current) use of antithrombotics/antiplatelets: Secondary | ICD-10-CM | POA: Diagnosis not present

## 2017-07-10 DIAGNOSIS — R634 Abnormal weight loss: Secondary | ICD-10-CM

## 2017-07-10 DIAGNOSIS — Z122 Encounter for screening for malignant neoplasm of respiratory organs: Secondary | ICD-10-CM | POA: Insufficient documentation

## 2017-07-10 DIAGNOSIS — I214 Non-ST elevation (NSTEMI) myocardial infarction: Secondary | ICD-10-CM

## 2017-07-10 DIAGNOSIS — I130 Hypertensive heart and chronic kidney disease with heart failure and stage 1 through stage 4 chronic kidney disease, or unspecified chronic kidney disease: Secondary | ICD-10-CM | POA: Insufficient documentation

## 2017-07-10 DIAGNOSIS — M47895 Other spondylosis, thoracolumbar region: Secondary | ICD-10-CM | POA: Insufficient documentation

## 2017-07-10 DIAGNOSIS — Z85828 Personal history of other malignant neoplasm of skin: Secondary | ICD-10-CM | POA: Diagnosis not present

## 2017-07-10 DIAGNOSIS — Z7982 Long term (current) use of aspirin: Secondary | ICD-10-CM | POA: Insufficient documentation

## 2017-07-10 DIAGNOSIS — E1122 Type 2 diabetes mellitus with diabetic chronic kidney disease: Secondary | ICD-10-CM | POA: Insufficient documentation

## 2017-07-10 DIAGNOSIS — J432 Centrilobular emphysema: Secondary | ICD-10-CM

## 2017-07-10 DIAGNOSIS — R918 Other nonspecific abnormal finding of lung field: Secondary | ICD-10-CM | POA: Insufficient documentation

## 2017-07-10 DIAGNOSIS — I700 Atherosclerosis of aorta: Secondary | ICD-10-CM

## 2017-07-10 DIAGNOSIS — E1129 Type 2 diabetes mellitus with other diabetic kidney complication: Secondary | ICD-10-CM | POA: Diagnosis not present

## 2017-07-10 DIAGNOSIS — R000 Tachycardia, unspecified: Secondary | ICD-10-CM | POA: Diagnosis not present

## 2017-07-10 LAB — BASIC METABOLIC PANEL
Anion gap: 14 (ref 5–15)
BUN: 55 mg/dL — AB (ref 6–20)
CALCIUM: 9.8 mg/dL (ref 8.9–10.3)
CHLORIDE: 99 mmol/L — AB (ref 101–111)
CO2: 19 mmol/L — AB (ref 22–32)
Creatinine, Ser: 3.4 mg/dL — ABNORMAL HIGH (ref 0.61–1.24)
GFR calc non Af Amer: 17 mL/min — ABNORMAL LOW (ref >60)
GFR, EST AFRICAN AMERICAN: 19 mL/min — AB (ref >60)
Glucose, Bld: 282 mg/dL — ABNORMAL HIGH (ref 65–99)
Potassium: 4.8 mmol/L (ref 3.5–5.1)
SODIUM: 132 mmol/L — AB (ref 135–145)

## 2017-07-10 LAB — DIFFERENTIAL
Basophils Absolute: 0.1 10*3/uL (ref 0–0.1)
Basophils Relative: 1 %
EOS PCT: 2 %
Eosinophils Absolute: 0.3 10*3/uL (ref 0–0.7)
LYMPHS ABS: 1.1 10*3/uL (ref 1.0–3.6)
LYMPHS PCT: 9 %
MONO ABS: 0.9 10*3/uL (ref 0.2–1.0)
MONOS PCT: 7 %
Neutro Abs: 9.9 10*3/uL — ABNORMAL HIGH (ref 1.4–6.5)
Neutrophils Relative %: 81 %

## 2017-07-10 LAB — URINALYSIS, COMPLETE (UACMP) WITH MICROSCOPIC
Bacteria, UA: NONE SEEN
Bilirubin Urine: NEGATIVE
Glucose, UA: 150 mg/dL — AB
Hgb urine dipstick: NEGATIVE
Ketones, ur: NEGATIVE mg/dL
Leukocytes, UA: NEGATIVE
Nitrite: NEGATIVE
PH: 6 (ref 5.0–8.0)
Protein, ur: 100 mg/dL — AB
SPECIFIC GRAVITY, URINE: 1.011 (ref 1.005–1.030)

## 2017-07-10 LAB — HEMOGLOBIN AND HEMATOCRIT, BLOOD
HCT: 25.7 % — ABNORMAL LOW (ref 40.0–52.0)
Hemoglobin: 8.2 g/dL — ABNORMAL LOW (ref 13.0–18.0)

## 2017-07-10 LAB — CBC
HCT: 30.2 % — ABNORMAL LOW (ref 40.0–52.0)
Hemoglobin: 9.8 g/dL — ABNORMAL LOW (ref 13.0–18.0)
MCH: 27.2 pg (ref 26.0–34.0)
MCHC: 32.3 g/dL (ref 32.0–36.0)
MCV: 84.1 fL (ref 80.0–100.0)
PLATELETS: 781 10*3/uL — AB (ref 150–440)
RBC: 3.59 MIL/uL — AB (ref 4.40–5.90)
RDW: 16.9 % — ABNORMAL HIGH (ref 11.5–14.5)
WBC: 12 10*3/uL — AB (ref 3.8–10.6)

## 2017-07-10 LAB — HEPATIC FUNCTION PANEL
ALBUMIN: 3 g/dL — AB (ref 3.5–5.0)
ALT: 21 U/L (ref 17–63)
AST: 24 U/L (ref 15–41)
Alkaline Phosphatase: 123 U/L (ref 38–126)
Bilirubin, Direct: <0.1 mg/dL — ABNORMAL LOW (ref 0.1–0.5)
TOTAL PROTEIN: 6.9 g/dL (ref 6.5–8.1)
Total Bilirubin: 0.4 mg/dL (ref 0.3–1.2)

## 2017-07-10 LAB — TROPONIN I
TROPONIN I: 0.05 ng/mL — AB (ref <0.03)
Troponin I: 0.05 ng/mL (ref <0.03)

## 2017-07-10 MED ORDER — IOPAMIDOL (ISOVUE-300) INJECTION 61%
15.0000 mL | INTRAVENOUS | Status: AC
  Administered 2017-07-10: 15 mL via ORAL

## 2017-07-10 MED ORDER — SODIUM CHLORIDE 0.9 % IV BOLUS
1000.0000 mL | Freq: Once | INTRAVENOUS | Status: AC
  Administered 2017-07-10: 1000 mL via INTRAVENOUS

## 2017-07-10 NOTE — ED Notes (Signed)
2nd bag of IVF about half way done. No distress noted. Family denies needing anything. Family denies any CP or SOB since being in ED.

## 2017-07-10 NOTE — ED Notes (Signed)
Date and time results received: 07/10/17 1625   Test: troponin Critical Value: 0.05  Name of Provider Notified: Dr. Cinda Quest

## 2017-07-10 NOTE — ED Notes (Signed)
Dr. Cinda Quest at bedside.

## 2017-07-10 NOTE — ED Notes (Signed)
Pt had previously urinated into a urinal. Upon returning from CT pt urinated into toilet.

## 2017-07-10 NOTE — ED Notes (Signed)
CT notified that pt finished oral contrast

## 2017-07-10 NOTE — Discharge Instructions (Signed)
you may want to try to eat more small meals more frequently. This may help with the vomiting. He may also want to get something like an sure and sip at that. It might help with the weight loss. I will ask you to follow-up with the gastroenterologist. I have provided you her name and office address. Please continue to follow-up with your family doctor in the renal doctor. Please return for worse vomiting fever further weight loss or feeling sicker.

## 2017-07-10 NOTE — ED Triage Notes (Signed)
Pt was sent over from Dr Baker Janus office for hypotension and tachycardia. Pt reports that he has been feeling weak and dizzy and nauseated. Denies fever.

## 2017-07-10 NOTE — ED Provider Notes (Signed)
[facility] Emergency Department Provider Note   ____________________________________________   First MD Initiated Contact with Patient 07/10/17 1515     (approximate)  I have reviewed the triage vital signs and the nursing notes.   HISTORY  Chief Complaint Hypotension and Tachycardia    HPI Thomas [name] is a 72 y.o. male Who complains of 3 weeks of nausea and vomiting. He's lost 10 pounds. He has not had any fever or diarrhea or chest pain. He does get some shortness of breath when he walks. The vomiting can come on at any time after eating or before he's eaten, only sitting watching TV etc. There is no complaint of abdominal pain or cramps with it. He has no other associated symptoms nausea and vomiting weight loss.   Past Medical History:  Diagnosis Date  . Allergy   . Arthritis   . CAD (coronary artery disease)   . Diabetes mellitus without complication ([place])   . Erosive esophagitis   . GERD (gastroesophageal reflux disease)   . Gouty arthropathy   . History of colonic polyps   . History of kidney stones   . History of MRSA infection   . Hyperkalemia   . Hyperlipidemia   . Hypertension   . Melanoma ([place])   . Myocardial infarction ([place]) 1986  . Peripheral vascular disease ([place])   . 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 Trigger finger of left hand   . Ureteral tumor     Patient Active Problem List   Diagnosis Date Noted  . Acute on chronic systolic CHF (congestive heart failure), NYHA class 2 ([place]) 06/22/2017  . CKD (chronic kidney disease), stage IV ([place]) 06/20/2017  . CHF (congestive heart failure) ([place]) 06/10/2017  . Systolic dysfunction 02/72/5366  . Acute on chronic renal failure ([place]) 05/24/2017  . CAD (coronary artery disease) of artery bypass graft 10/25/2016  . Abnormal ankle brachial index (ABI) 10/13/2016  . Intermittent claudication ([place]) 09/30/2016  . PVC (premature ventricular contraction)  09/30/2016  . Hyperlipidemia 09/30/2016  . Aortic atherosclerosis ([place]) 06/17/2016  . Duodenitis 11/24/2014  . Erosive esophagitis 11/24/2014  . Personal history of methicillin resistant Staphylococcus aureus 11/24/2014  . High potassium 11/24/2014  . Melanoma of skin ([place]) 11/24/2014  . Acquired trigger finger 11/24/2014  . 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 DM (diabetes mellitus), type 2 with renal complications ([place]) 44/06/4740  . History of smoking 30 or more pack years 08/26/2009  . Allergic rhinitis 08/06/2006  . 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 History of colon polyps 05/09/2003  . 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 Essential (primary) hypertension 06/09/1995    Past Surgical History:  Procedure Laterality Date  . CATARACT EXTRACTION    . CORONARY ARTERY BYPASS GRAFT  1996  . CORONARY STENT INTERVENTION N/A 05/29/2017   Procedure: CORONARY STENT INTERVENTION;  Surgeon: Isaias Cowman, MD;  Location: [place] [facility];  Service: Cardiovascular;  Laterality: N/A;  . EXCISION Ureteral Tumor     (benign) Dr. Quillian Quince  . EYE SURGERY Bilateral    Cataract Extraction with IOL  . LEFT HEART CATH AND CORONARY ANGIOGRAPHY N/A 10/18/2016   Procedure: Left Heart Cath and Coronary Angiography;  Surgeon: Isaias Cowman, MD;  Location: [place] [facility];  Service: Cardiovascular;  Laterality: N/A;  . LEFT HEART CATH AND CORONARY ANGIOGRAPHY N/A 05/29/2017   Procedure: LEFT HEART CATH AND CORONARY ANGIOGRAPHY;  Surgeon: Isaias Cowman, MD;  Location: [place] [facility];  Service: Cardiovascular;  Laterality: N/A;  . LOWER EXTREMITY ANGIOGRAPHY Right 10/31/2016   Procedure: Lower Extremity Angiography;  Surgeon: Algernon Huxley, MD;  Location: [place] [facility];  Service: Cardiovascular;  Laterality: Right;  . LOWER EXTREMITY ANGIOGRAPHY Left 11/14/2016   Procedure: Lower Extremity Angiography;  Surgeon:  Algernon Huxley, MD;  Location: [place] [facility];  Service: Cardiovascular;  Laterality: Left;  . LOWER EXTREMITY INTERVENTION  11/14/2016   Procedure: Lower Extremity Intervention;  Surgeon: Algernon Huxley, MD;  Location: [place] [facility];  Service: Cardiovascular;;  . RETINAL DETACHMENT SURGERY Right November 2107    Prior to Admission medications   Medication Sig Start Date End Date Taking? Authorizing Provider  acetaminophen (TYLENOL) 500 MG tablet Take 500 mg by mouth every 8 (eight) hours as needed for mild pain or headache.   Yes [provider]  aspirin EC 81 MG tablet Take 81 mg by mouth daily.   Yes [provider]  atorvastatin (LIPITOR) 80 MG tablet TAKE ONE TABLET BY MOUTH EVERY DAY 02/21/17  Yes Birdie Sons, MD  b complex vitamins capsule Take 1 capsule by mouth daily.   Yes [provider]  buPROPion (WELLBUTRIN SR) 150 MG 12 hr tablet 1 tablet daily for 3 days, then 1 tablet twice daily. Stop smoking 14 days after starting medication Patient taking differently: Take 150 mg by mouth 2 (two) times daily. 1 tablet daily for 3 days, then 1 tablet twice daily. Stop smoking 14 days after starting medication 06/07/17 10/05/17 Yes Birdie Sons, MD  clopidogrel (PLAVIX) 75 MG tablet Take 1 tablet (75 mg total) by mouth daily. 05/30/17  Yes Gouru, Illene Silver, MD  Ferrous Sulfate (IRON) 325 (65 Fe) MG TABS Take 325 mg by mouth 2 (two) times daily.   Yes [provider]  hydrALAZINE (APRESOLINE) 25 MG tablet Take 1 tablet (25 mg total) by mouth every 8 (eight) hours. 06/14/17  Yes Demetrios Loll, MD  isosorbide mononitrate (IMDUR) 60 MG 24 hr tablet Take 1 tablet (60 mg total) by mouth daily. 07/04/17  Yes Birdie Sons, MD  metoprolol succinate (TOPROL-XL) 50 MG 24 hr tablet Take 1 tablet (50 mg total) by mouth daily. Take with or immediately following a meal. 07/04/17  Yes Birdie Sons, MD  pantoprazole (PROTONIX) 40 MG tablet Take 1 tablet (40 mg total)  by mouth daily. 06/03/16  Yes Birdie Sons, MD  promethazine (PHENERGAN) 25 MG tablet Take 1 tablet (25 mg total) by mouth every 8 (eight) hours as needed for nausea or vomiting. 06/20/17  Yes Birdie Sons, MD  amLODipine (NORVASC) 5 MG tablet Take 1 tablet (5 mg total) by mouth daily. Patient not taking: Reported on 07/10/2017 05/31/17   Nicholes Mango, MD  HYDROcodone-acetaminophen (NORCO/VICODIN) 5-325 MG tablet Take 1 tablet by mouth every 6 (six) hours as needed for moderate pain. Patient not taking: Reported on 07/10/2017 05/30/17   Nicholes Mango, MD    Allergies Ciprofloxacin  Family History  Problem Relation Age of Onset  . Alzheimer's disease Mother   . Alzheimer's disease Brother   . Lung cancer Paternal Grandfather     Social History Social History   Tobacco Use  . Smoking status: Former Smoker    Packs/day: 1.00    Years: 51.00    Pack years: 51.00    Types: Cigarettes    Last attempt to quit: 06/21/2017  Years since quitting: 0.0  . Smokeless tobacco: Never Used  . Tobacco comment: started smoking at age 5-25. Smoked for 50 years  Substance Use Topics  . Alcohol use: No    Alcohol/week: 0.0 oz    Frequency: Never  . Drug use: No    Review of Systems  Constitutional: No fever/chills Eyes: No visual changes. ENT: No sore throat. Cardiovascular: Denies chest pain. Respiratory: Denies shortness of breath. Gastrointestinal: No abdominal pain.   nausea,vomiting.  No diarrhea.  No constipation. Genitourinary: Negative for dysuria. Musculoskeletal: Negative for back pain. Skin: Negative for rash. Neurological: Negative for headaches, focal weakness    ____________________________________________   PHYSICAL EXAM:  VITAL SIGNS: ED Triage Vitals [07/10/17 1452]  Enc Vitals Group     BP 103/74     Pulse Rate (!) 124     Resp 18     Temp 97.8 F (36.6 C)     Temp Source Oral     SpO2 99 %     Weight 152 lb (68.9 kg)     Height 5\' 6"  (1.676 m)      Head Circumference      Peak Flow      Pain Score 0     Pain Loc      Pain Edu?      Excl. in [place]?     Constitutional: Alert and oriented. Well appearing and in no acute distress. Eyes: Conjunctivae are normal. Head: Atraumatic. Nose: No congestion/rhinnorhea. Mouth/Throat: Mucous membranes are moist.  Oropharynx non-erythematous. Neck: No stridor.  Cardiovascular: Normal rate, regular rhythm. Grossly normal heart sounds.  Good peripheral circulation. Respiratory: Normal respiratory effort.  No retractions. Lungs CTAB. Gastrointestinal: Soft and nontender. No distention. No abdominal bruits. No CVA tenderness. rectal: No masses normal prostate. No blood in the stool and Hemoccult negative Musculoskeletal: No lower extremity tenderness nor edema.  No joint effusions. Neurologic:  Normal speech and language. No gross focal neurologic deficits are appreciated. Skin:  Skin is warm, dry and intact. No rash noted. Psychiatric: Mood and affect are normal. Speech and behavior are normal.  ____________________________________________   LABS (all labs ordered are listed, but only abnormal results are displayed)  Labs Reviewed  BASIC METABOLIC PANEL - Abnormal; Notable for the following components:      Result Value   Sodium 132 (*)    Chloride 99 (*)    CO2 19 (*)    Glucose, Bld 282 (*)    BUN 55 (*)    Creatinine, Ser 3.40 (*)    GFR calc non Af Amer 17 (*)    GFR calc Af Amer 19 (*)    All other components within normal limits  CBC - Abnormal; Notable for the following components:   WBC 12.0 (*)    RBC 3.59 (*)    Hemoglobin 9.8 (*)    HCT 30.2 (*)    RDW 16.9 (*)    Platelets 781 (*)    All other components within normal limits  URINALYSIS, COMPLETE (UACMP) WITH MICROSCOPIC - Abnormal; Notable for the following components:   Color, Urine YELLOW (*)    APPearance CLEAR (*)    Glucose, UA 150 (*)    Protein, ur 100 (*)    Squamous Epithelial / LPF 0-5 (*)    All other  components within normal limits  TROPONIN I - Abnormal; Notable for the following components:   Troponin I 0.05 (*)    All other components within normal limits  HEPATIC  FUNCTION PANEL - Abnormal; Notable for the following components:   Albumin 3.0 (*)    Bilirubin, Direct <0.1 (*)    All other components within normal limits  DIFFERENTIAL - Abnormal; Notable for the following components:   Neutro Abs 9.9 (*)    All other components within normal limits  TROPONIN I - Abnormal; Notable for the following components:   Troponin I 0.05 (*)    All other components within normal limits  HEMOGLOBIN AND HEMATOCRIT, BLOOD - Abnormal; Notable for the following components:   Hemoglobin 8.2 (*)    HCT 25.7 (*)    All other components within normal limits   ____________________________________________  EKG  EKG read and interpreted by me shows sinus tachycardia rate of 124 rightward axis right bundle branch block and left posterior hemiblock. No acute ST-T wave changes but there is some ST segment depression in V4 5 and 6 which is new from February. ____________________________________________  [place]  ED MD interpretationchest x-ray reviewed heart is somewhat enlarged radiology agrees.  CT shows no reason for his vomiting  Official radiology report(s): Ct Abdomen Pelvis Wo Contrast  Result Date: 07/10/2017 CLINICAL DATA:  Pt was sent over from Dr Baker Janus office for hypotension and tachycardia. Pt reports that he has been feeling weak and dizzy and nauseated. Denies fever. EXAM: CT ABDOMEN AND PELVIS WITHOUT CONTRAST TECHNIQUE: Multidetector CT imaging of the abdomen and pelvis was performed following the standard protocol without IV contrast. COMPARISON:  04/01/2014 FINDINGS: Lower chest: No acute abnormality. Hepatobiliary: No focal liver abnormality is seen. No gallstones, gallbladder wall thickening, or biliary dilatation. Pancreas: Unremarkable. No pancreatic ductal dilatation or  surrounding inflammatory changes. Spleen: Normal in size without focal abnormality. Adrenals/Urinary Tract: No adrenal masses. Mild bilateral renal cortical thinning, greater on the left with leads to the lobulated renal contour. 2.2 cm cyst arises from the left kidney upper pole. 9 mm cyst arises from the lateral midpole the right kidney. No other definite renal masses. No collecting system stones. No hydronephrosis. Ureters are normal in caliber. Right ureter has not ectopic insertion on the bladder, inserting on the right dome. Normal position of the left ureteral trigone. Bladder is unremarkable. Stomach/Bowel: Stomach is within normal limits. Appendix appears normal. No evidence of bowel wall thickening, distention, or inflammatory changes. Vascular/Lymphatic: There is aortic atherosclerosis and ectasia. Maximum diameter of the infrarenal aorta is 2.2 cm. No adenopathy. Reproductive: Prostate is enlarged and bulges into the posterior inferior bladder base, stable from the prior CT. Other: There is a fat containing paraumbilical hernia. A knuckle of small bowel lies at the abdominal wall defect but does not fully into the hernia. There are additional midline fat containing hernias above this, with no bowel extending into these hernias. These findings are stable. No ascites. Musculoskeletal: No fracture or acute finding. No osteoblastic or osteolytic lesions. IMPRESSION: 1. No acute abnormalities within the abdomen or pelvis. No findings to account for hypotension. 2. There is abdominal aortic ectasia and diffuse atherosclerotic disease. 3. Stable anterior abdominal wall midline fat containing hernias. 4. Ectopic insertion of the right ureter, which inserts on the right bladder dome. Electronically Signed   By: Lajean Manes M.D.   On: 07/10/2017 18:31   Dg Chest Portable 1 View  Result Date: 07/10/2017 CLINICAL DATA:  High hypotension, tachycardia, weakness and dizziness, nausea EXAM: PORTABLE CHEST 1 VIEW  COMPARISON:  Portable exam 1531 hours compared to 06/10/2017 FINDINGS: Minimal enlargement of cardiac silhouette post CABG. Atherosclerotic calcification aorta. Mediastinal contours  and pulmonary vascularity normal. Lungs clear. No infiltrate, pleural effusion or pneumothorax. Bones demineralized. IMPRESSION: Minimal enlargement of cardiac silhouette post CABG. No acute abnormalities. Electronically Signed   By: Lavonia Dana M.D.   On: 07/10/2017 15:58    ____________________________________________   PROCEDURES  Procedure(s) p  Procedures  Critical Care performed:   ____________________________________________   INITIAL IMPRESSION / ASSESSMENT AND PLAN / ED COURSE            ____________________________________________   FINAL CLINICAL IMPRESSION(S) / ED DIAGNOSES  Final diagnoses:  Non-intractable vomiting with nausea, unspecified vomiting type  Weight loss     ED Discharge Orders    None       Note:  This document was prepared using Dragon voice recognition software and may include unintentional dictation errors.    Nena Polio, MD 07/10/17 2051

## 2017-07-10 NOTE — ED Notes (Addendum)
Family states pt has lost 10lbs over 3 weeks d/t N&V. Denies diarrhea. Denies fever. Denies pain. Family states "when he gets up and walks he gets SOB." pt states his doctor told him he is anemic. Pt states that he has had trouble starting to urinate, is able to start but it takes him a while. Family concerned for prostate issues d/t ruling out UTI last week.

## 2017-07-10 NOTE — ED Notes (Signed)
Pt taken to CT via stretcher.

## 2017-07-11 DIAGNOSIS — R112 Nausea with vomiting, unspecified: Secondary | ICD-10-CM | POA: Diagnosis not present

## 2017-07-12 ENCOUNTER — Encounter: Payer: Self-pay | Admitting: *Deleted

## 2017-07-12 DIAGNOSIS — I214 Non-ST elevation (NSTEMI) myocardial infarction: Secondary | ICD-10-CM

## 2017-07-12 DIAGNOSIS — R112 Nausea with vomiting, unspecified: Secondary | ICD-10-CM | POA: Diagnosis not present

## 2017-07-12 DIAGNOSIS — Z955 Presence of coronary angioplasty implant and graft: Secondary | ICD-10-CM

## 2017-07-12 NOTE — Progress Notes (Signed)
Cardiac Individual Treatment Plan  Patient Details  Name: Thomas [name] MRN: [id number] Date of Birth: March 22, 1946 Referring Provider:     Cardiac Rehab from 06/29/2017 in [facility] Cardiac and Pulmonary Rehab  Referring Provider  Paraschos      Initial Encounter Date:    Cardiac Rehab from 06/29/2017 in [facility] Cardiac and Pulmonary Rehab  Date  06/29/17  Referring Provider  Paraschos      Visit Diagnosis: NSTEMI (non-ST elevated myocardial infarction) [facility])  Status post coronary artery stent placement  Patient's Home Medications on Admission:  Current Outpatient Medications:  .  acetaminophen (TYLENOL) 500 MG tablet, Take 500 mg by mouth every 8 (eight) hours as needed for mild pain or headache., Disp: , Rfl:  .  amLODipine (NORVASC) 5 MG tablet, Take 1 tablet (5 mg total) by mouth daily. (Patient not taking: Reported on 07/10/2017), Disp: 30 tablet, Rfl: 0 .  aspirin EC 81 MG tablet, Take 81 mg by mouth daily., Disp: , Rfl:  .  atorvastatin (LIPITOR) 80 MG tablet, TAKE ONE TABLET BY MOUTH EVERY DAY, Disp: 30 tablet, Rfl: 5 .  b complex vitamins capsule, Take 1 capsule by mouth daily., Disp: , Rfl:  .  buPROPion (WELLBUTRIN SR) 150 MG 12 hr tablet, 1 tablet daily for 3 days, then 1 tablet twice daily. Stop smoking 14 days after starting medication (Patient taking differently: Take 150 mg by mouth 2 (two) times daily. 1 tablet daily for 3 days, then 1 tablet twice daily. Stop smoking 14 days after starting medication), Disp: 60 tablet, Rfl: 5 .  clopidogrel (PLAVIX) 75 MG tablet, Take 1 tablet (75 mg total) by mouth daily., Disp: 30 tablet, Rfl: 1 .  Ferrous Sulfate (IRON) 325 (65 Fe) MG TABS, Take 325 mg by mouth 2 (two) times daily., Disp: , Rfl:  .  hydrALAZINE (APRESOLINE) 25 MG tablet, Take 1 tablet (25 mg total) by mouth every 8 (eight) hours., Disp: 90 tablet, Rfl: 0 .  HYDROcodone-acetaminophen (NORCO/VICODIN) 5-325 MG tablet, Take 1 tablet by mouth every 6 (six) hours as needed for  moderate pain. (Patient not taking: Reported on 07/10/2017), Disp: 20 tablet, Rfl: 0 .  isosorbide mononitrate (IMDUR) 60 MG 24 hr tablet, Take 1 tablet (60 mg total) by mouth daily., Disp: 30 tablet, Rfl: 5 .  metoprolol succinate (TOPROL-XL) 50 MG 24 hr tablet, Take 1 tablet (50 mg total) by mouth daily. Take with or immediately following a meal., Disp: 30 tablet, Rfl: 5 .  pantoprazole (PROTONIX) 40 MG tablet, Take 1 tablet (40 mg total) by mouth daily., Disp: 30 tablet, Rfl: 12 .  promethazine (PHENERGAN) 25 MG tablet, Take 1 tablet (25 mg total) by mouth every 8 (eight) hours as needed for nausea or vomiting., Disp: 20 tablet, Rfl: 0  Past Medical History: Past Medical History:  Diagnosis Date  . Allergy   . Arthritis   . CAD (coronary artery disease)   . Diabetes mellitus without complication ([place])   . Erosive esophagitis   . GERD (gastroesophageal reflux disease)   . Gouty arthropathy   . History of colonic polyps   . History of kidney stones   . History of MRSA infection   . Hyperkalemia   . Hyperlipidemia   . Hypertension   . Melanoma ([place])   . Myocardial infarction ([place]) 1986  . Peripheral vascular disease ([place])   . 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 Trigger finger of left hand   .  Ureteral tumor     Tobacco Use: Social History   Tobacco Use  Smoking Status Former Smoker  . Packs/day: 1.00  . Years: 51.00  . Pack years: 51.00  . Types: Cigarettes  . Last attempt to quit: 06/21/2017  . Years since quitting: 0.0  Smokeless Tobacco Never Used  Tobacco Comment   started smoking at age [age]. Smoked for 50 years    Labs: Recent Review Flowsheet Data    Labs for ITP Cardiac and Pulmonary Rehab Latest Ref Rng & Units 06/03/2016 08/10/2016 09/30/2016 01/31/2017 06/30/2017   Cholestrol 100 - 199 mg/dL 404(H) 200(H) 212(H) - 140   LDLCALC 0 - 99 mg/dL Comment 101(H) 112(H) - 70   HDL >39 mg/dL 36(L) 36(L) 36(L) - 47   Trlycerides 0 - 149 mg/dL  642(HH) 315(H) 322(H) - 115   Hemoglobin A1c 4.8 - 5.6 % 8.0 - 7.9 7.6 7.6(H)       Exercise Target Goals:    Exercise Program Goal: Individual exercise prescription set using results from initial 6 min walk test and THRR while considering  patient's activity barriers and safety.   Exercise Prescription Goal: Initial exercise prescription builds to 30-45 minutes a day of aerobic activity, 2-3 days per week.  Home exercise guidelines will be given to patient during program as part of exercise prescription that the participant will acknowledge.  Activity Barriers & Risk Stratification: Activity Barriers & Cardiac Risk Stratification - 06/29/17 1347      Activity Barriers & Cardiac Risk Stratification   Activity Barriers  Shortness of Breath    Cardiac Risk Stratification  Moderate       6 Minute Walk: 6 Minute Walk    Row Name 06/29/17 1424         6 Minute Walk   Distance  1070 feet     Walk Time  6 minutes     # of Rest Breaks  0     MPH  2     METS  2.4     RPE  13     Perceived Dyspnea   1     VO2 Peak  8.4     Symptoms  Yes (comment)     Comments  leg fatigue/shins "burn" 5/10     Resting HR  63 bpm     Resting BP  110/60     Resting Oxygen Saturation   98 %     Exercise Oxygen Saturation  during 6 min walk  97 %     Max Ex. HR  98 bpm     Max Ex. BP  134/66     2 Minute Post BP  120/76        Oxygen Initial Assessment:   Oxygen Re-Evaluation:   Oxygen Discharge (Final Oxygen Re-Evaluation):   Initial Exercise Prescription: Initial Exercise Prescription - 06/29/17 1400      Date of Initial Exercise RX and Referring Provider   Date  06/29/17    Referring Provider  Paraschos      Treadmill   MPH  1.5    Grade  0    Minutes  15    METs  2.15      Recumbant Bike   Level  1    RPM  60    Watts  9    Minutes  15    METs  2      NuStep   Level  1    SPM  80  Minutes  15    METs  2      Track   Laps  25    Minutes  15    METs  2.15       Prescription Details   Frequency (times per week)  3    Duration  Progress to 45 minutes of aerobic exercise without signs/symptoms of physical distress      Intensity   THRR 40-80% of Max Heartrate  96-130    Ratings of Perceived Exertion  11-13    Perceived Dyspnea  0-4      Resistance Training   Training Prescription  Yes    Weight  3 lb    Reps  10-15       Perform Capillary Blood Glucose checks as needed.  Exercise Prescription Changes: Exercise Prescription Changes    Row Name 06/29/17 1300             Response to Exercise   Blood Pressure (Admit)  110/60       Blood Pressure (Exercise)  134/66       Blood Pressure (Exit)  120/76       Heart Rate (Admit)  61 bpm       Heart Rate (Exercise)  98 bpm       Heart Rate (Exit)  73 bpm       Oxygen Saturation (Admit)  98 %       Oxygen Saturation (Exit)  97 %       Rating of Perceived Exertion (Exercise)  13          Exercise Comments:   Exercise Goals and Review: Exercise Goals    Row Name 06/29/17 1437             Exercise Goals   Increase Physical Activity  Yes       Intervention  Provide advice, education, support and counseling about physical activity/exercise needs.;Develop an individualized exercise prescription for aerobic and resistive training based on initial evaluation findings, risk stratification, comorbidities and participant's personal goals.       Expected Outcomes  Short Term: Attend rehab on a regular basis to increase amount of physical activity.;Long Term: Add in home exercise to make exercise part of routine and to increase amount of physical activity.;Long Term: Exercising regularly at least 3-5 days a week.       Increase Strength and Stamina  Yes       Intervention  Provide advice, education, support and counseling about physical activity/exercise needs.;Develop an individualized exercise prescription for aerobic and resistive training based on initial evaluation findings, risk  stratification, comorbidities and participant's personal goals.       Expected Outcomes  Short Term: Increase workloads from initial exercise prescription for resistance, speed, and METs.;Short Term: Perform resistance training exercises routinely during rehab and add in resistance training at home;Long Term: Improve cardiorespiratory fitness, muscular endurance and strength as measured by increased METs and functional capacity (6MWT)       Able to understand and use rate of perceived exertion (RPE) scale  Yes       Intervention  Provide education and explanation on how to use RPE scale       Expected Outcomes  Short Term: Able to use RPE daily in rehab to express subjective intensity level;Long Term:  Able to use RPE to guide intensity level when exercising independently       Able to understand and use Dyspnea scale  Yes  Intervention  Provide education and explanation on how to use Dyspnea scale       Expected Outcomes  Short Term: Able to use Dyspnea scale daily in rehab to express subjective sense of shortness of breath during exertion;Long Term: Able to use Dyspnea scale to guide intensity level when exercising independently       Knowledge and understanding of Target Heart Rate Range (THRR)  Yes       Intervention  Provide education and explanation of THRR including how the numbers were predicted and where they are located for reference       Expected Outcomes  Short Term: Able to state/look up THRR;Long Term: Able to use THRR to govern intensity when exercising independently;Short Term: Able to use daily as guideline for intensity in rehab       Able to check pulse independently  Yes       Intervention  Provide education and demonstration on how to check pulse in carotid and radial arteries.;Review the importance of being able to check your own pulse for safety during independent exercise       Expected Outcomes  Short Term: Able to explain why pulse checking is important during independent  exercise;Long Term: Able to check pulse independently and accurately       Understanding of Exercise Prescription  Yes       Intervention  Provide education, explanation, and written materials on patient's individual exercise prescription       Expected Outcomes  Short Term: Able to explain program exercise prescription;Long Term: Able to explain home exercise prescription to exercise independently          Exercise Goals Re-Evaluation :   Discharge Exercise Prescription (Final Exercise Prescription Changes): Exercise Prescription Changes - 06/29/17 1300      Response to Exercise   Blood Pressure (Admit)  110/60    Blood Pressure (Exercise)  134/66    Blood Pressure (Exit)  120/76    Heart Rate (Admit)  61 bpm    Heart Rate (Exercise)  98 bpm    Heart Rate (Exit)  73 bpm    Oxygen Saturation (Admit)  98 %    Oxygen Saturation (Exit)  97 %    Rating of Perceived Exertion (Exercise)  13       Nutrition:  Target Goals: Understanding of nutrition guidelines, daily intake of sodium <1563m, cholesterol <2037m calories 30% from fat and 7% or less from saturated fats, daily to have 5 or more servings of fruits and vegetables.  Biometrics: Pre Biometrics - 06/29/17 1417      Pre Biometrics   Height  5' 5.5" (1.664 m)    Weight  158 lb 1.6 oz (71.7 kg)    Waist Circumference  35 inches    Hip Circumference  36 inches    Waist to Hip Ratio  0.97 %    BMI (Calculated)  25.9    Single Leg Stand  2.6 seconds        Nutrition Therapy Plan and Nutrition Goals: Nutrition Therapy & Goals - 06/29/17 1340      Intervention Plan   Intervention  Prescribe, educate and counsel regarding individualized specific dietary modifications aiming towards targeted core components such as weight, hypertension, lipid management, diabetes, heart failure and other comorbidities.;Nutrition handout(s) given to patient.    Expected Outcomes  Short Term Goal: Understand basic principles of dietary  content, such as calories, fat, sodium, cholesterol and nutrients.;Short Term Goal: A plan has been developed with  personal nutrition goals set during dietitian appointment.;Long Term Goal: Adherence to prescribed nutrition plan.       Nutrition Assessments: Nutrition Assessments - 06/29/17 1341      MEDFICTS Scores   Pre Score  33       Nutrition Goals Re-Evaluation:   Nutrition Goals Discharge (Final Nutrition Goals Re-Evaluation):   Psychosocial: Target Goals: Acknowledge presence or absence of significant depression and/or stress, maximize coping skills, provide positive support system. Participant is able to verbalize types and ability to use techniques and skills needed for reducing stress and depression.   Initial Review & Psychosocial Screening: Initial Psych Review & Screening - 06/29/17 1341      Initial Review   Current issues with  Current Anxiety/Panic;Current Sleep Concerns wakes up almost hourly due to lasix      Family Dynamics   Good Support System?  Yes Wife, daughters, friends      Screening Interventions   Interventions  Encouraged to exercise;Provide feedback about the scores to participant;Program counselor consult;To provide support and resources with identified psychosocial needs    Expected Outcomes  Short Term goal: Utilizing psychosocial counselor, staff and physician to assist with identification of specific Stressors or current issues interfering with healing process. Setting desired goal for each stressor or current issue identified.;Long Term Goal: Stressors or current issues are controlled or eliminated.;Short Term goal: Identification and review with participant of any Quality of Life or Depression concerns found by scoring the questionnaire.;Long Term goal: The participant improves quality of Life and PHQ9 Scores as seen by post scores and/or verbalization of changes       Quality of Life Scores:  Quality of Life - 06/29/17 1342      Quality of  Life Scores   Health/Function Pre  23.63 %    Socioeconomic Pre  29.17 %    Psych/Spiritual Pre  29.14 %    Family Pre  30 %    GLOBAL Pre  26.77 %      Scores of 19 and below usually indicate a poorer quality of life in these areas.  A difference of  2-3 points is a clinically meaningful difference.  A difference of 2-3 points in the total score of the Quality of Life Index has been associated with significant improvement in overall quality of life, self-image, physical symptoms, and general health in studies assessing change in quality of life.  PHQ-9: Recent Review Flowsheet Data    Depression screen [facility] 2/9 06/29/2017 06/20/2017 06/07/2017 06/03/2016 11/24/2014   Decreased Interest 0 0 0 0 0   Down, Depressed, Hopeless 0 0 0 0 0   PHQ - 2 Score 0 0 0 0 0   Altered sleeping 3 0 0 - 0   Tired, decreased energy 2 0 1 - 0   Change in appetite 2 0 0 - 0   Feeling bad or failure about yourself  0 0 0 - 0   Trouble concentrating 0 0 0 - 0   Moving slowly or fidgety/restless 2 0 0 - 0   Suicidal thoughts 0 0 0 - 0   PHQ-9 Score 9 0 1 - 0   Difficult doing work/chores Not difficult at all Not difficult at all Not difficult at all - Not difficult at all     Interpretation of Total Score  Total Score Depression Severity:  1-4 = Minimal depression, 5-9 = Mild depression, 10-14 = Moderate depression, 15-19 = Moderately severe depression, 20-27 = Severe depression  Psychosocial Evaluation and Intervention:   Psychosocial Re-Evaluation:   Psychosocial Discharge (Final Psychosocial Re-Evaluation):   Vocational Rehabilitation: Provide vocational rehab assistance to qualifying candidates.   Vocational Rehab Evaluation & Intervention: Vocational Rehab - 06/29/17 1346      Initial Vocational Rehab Evaluation & Intervention   Assessment shows need for Vocational Rehabilitation  No       Education: Education Goals: Education classes will be provided on a variety of topics geared toward  better understanding of heart health and risk factor modification. Participant will state understanding/return demonstration of topics presented as noted by education test scores.  Learning Barriers/Preferences: Learning Barriers/Preferences - 06/29/17 1345      Learning Barriers/Preferences   Learning Barriers  Hearing    Learning Preferences  Audio;Verbal Instruction;Video       Education Topics:  AED/CPR: - Group verbal and written instruction with the use of models to demonstrate the basic use of the AED with the basic ABC's of resuscitation.   General Nutrition Guidelines/Fats and Fiber: -Group instruction provided by verbal, written material, models and posters to present the general guidelines for heart healthy nutrition. Gives an explanation and review of dietary fats and fiber.   Controlling Sodium/Reading Food Labels: -Group verbal and written material supporting the discussion of sodium use in heart healthy nutrition. Review and explanation with models, verbal and written materials for utilization of the food label.   Exercise Physiology & General Exercise Guidelines: - Group verbal and written instruction with models to review the exercise physiology of the cardiovascular system and associated critical values. Provides general exercise guidelines with specific guidelines to those with heart or lung disease.    Aerobic Exercise & Resistance Training: - Gives group verbal and written instruction on the various components of exercise. Focuses on aerobic and resistive training programs and the benefits of this training and how to safely progress through these programs..   Flexibility, Balance, Mind/Body Relaxation: Provides group verbal/written instruction on the benefits of flexibility and balance training, including mind/body exercise modes such as yoga, pilates and tai chi.  Demonstration and skill practice provided.   Stress and Anxiety: - Provides group verbal and  written instruction about the health risks of elevated stress and causes of high stress.  Discuss the correlation between heart/lung disease and anxiety and treatment options. Review healthy ways to manage with stress and anxiety.   Depression: - Provides group verbal and written instruction on the correlation between heart/lung disease and depressed mood, treatment options, and the stigmas associated with seeking treatment.   Anatomy & Physiology of the Heart: - Group verbal and written instruction and models provide basic cardiac anatomy and physiology, with the coronary electrical and arterial systems. Review of Valvular disease and Heart Failure   Cardiac Procedures: - Group verbal and written instruction to review commonly prescribed medications for heart disease. Reviews the medication, class of the drug, and side effects. Includes the steps to properly store meds and maintain the prescription regimen. (beta blockers and nitrates)   Cardiac Medications I: - Group verbal and written instruction to review commonly prescribed medications for heart disease. Reviews the medication, class of the drug, and side effects. Includes the steps to properly store meds and maintain the prescription regimen.   Cardiac Medications II: -Group verbal and written instruction to review commonly prescribed medications for heart disease. Reviews the medication, class of the drug, and side effects. (all other drug classes)    Go Sex-Intimacy & Heart Disease, Get SMART - Goal Setting: -  Group verbal and written instruction through game format to discuss heart disease and the return to sexual intimacy. Provides group verbal and written material to discuss and apply goal setting through the application of the S.M.A.R.T. Method.   Other Matters of the Heart: - Provides group verbal, written materials and models to describe Stable Angina and Peripheral Artery. Includes description of the disease process and  treatment options available to the cardiac patient.   Exercise & Equipment Safety: - Individual verbal instruction and demonstration of equipment use and safety with use of the equipment.   Cardiac Rehab from 06/29/2017 in [facility] Cardiac and Pulmonary Rehab  Date  06/29/17  Educator  [facility]  Instruction Review Code  1- Verbalizes Understanding      Infection Prevention: - Provides verbal and written material to individual with discussion of infection control including proper hand washing and proper equipment cleaning during exercise session.   Cardiac Rehab from 06/29/2017 in [facility] Cardiac and Pulmonary Rehab  Date  06/29/17  Educator  [facility]  Instruction Review Code  1- Verbalizes Understanding      Falls Prevention: - Provides verbal and written material to individual with discussion of falls prevention and safety.   Cardiac Rehab from 06/29/2017 in [facility] Cardiac and Pulmonary Rehab  Date  06/29/17  Educator  [facility]  Instruction Review Code  1- Verbalizes Understanding      Diabetes: - Individual verbal and written instruction to review signs/symptoms of diabetes, desired ranges of glucose level fasting, after meals and with exercise. Acknowledge that pre and post exercise glucose checks will be done for 3 sessions at entry of program.   Cardiac Rehab from 06/29/2017 in [facility] Cardiac and Pulmonary Rehab  Date  06/29/17  Educator  [facility]  Instruction Review Code  1- Verbalizes Understanding      Know Your Numbers and Risk Factors: -Group verbal and written instruction about important numbers in your health.  Discussion of what are risk factors and how they play a role in the disease process.  Review of Cholesterol, Blood Pressure, Diabetes, and BMI and the role they play in your overall health.   Sleep Hygiene: -Provides group verbal and written instruction about how sleep can affect your health.  Define sleep hygiene, discuss sleep cycles and impact of sleep habits. Review good sleep hygiene  tips.    Other: -Provides group and verbal instruction on various topics (see comments)   Knowledge Questionnaire Score: Knowledge Questionnaire Score - 06/29/17 1346      Knowledge Questionnaire Score   Pre Score  24/28 Correct answers reviewed with Fritz Pickerel       Core Components/Risk Factors/Patient Goals at Admission: Personal Goals and Risk Factors at Admission - 06/29/17 1338      Core Components/Risk Factors/Patient Goals on Admission    Weight Management  Yes;Weight Loss    Intervention  Weight Management: Develop a combined nutrition and exercise program designed to reach desired caloric intake, while maintaining appropriate intake of nutrient and fiber, sodium and fats, and appropriate energy expenditure required for the weight goal.;Weight Management: Provide education and appropriate resources to help participant work on and attain dietary goals.;Weight Management/Obesity: Establish reasonable short term and long term weight goals.    Admit Weight  156 lb (70.8 kg)    Goal Weight: Short Term  152 lb (68.9 kg)    Goal Weight: Long Term  150 lb (68 kg)    Expected Outcomes  Short Term: Continue to assess and modify interventions until short term  weight is achieved;Long Term: Adherence to nutrition and physical activity/exercise program aimed toward attainment of established weight goal;Weight Loss: Understanding of general recommendations for a balanced deficit meal plan, which promotes 1-2 lb weight loss per week and includes a negative energy balance of [phone] kcal/d;Understanding recommendations for meals to include 15-35% energy as protein, 25-35% energy from fat, 35-60% energy from carbohydrates, less than 254m of dietary cholesterol, 20-35 gm of total fiber daily;Understanding of distribution of calorie intake throughout the day with the consumption of 4-5 meals/snacks    Diabetes  Yes    Intervention  Provide education about signs/symptoms and action to take for  hypo/hyperglycemia.;Provide education about proper nutrition, including hydration, and aerobic/resistive exercise prescription along with prescribed medications to achieve blood glucose in normal ranges: Fasting glucose 65-99 mg/dL    Expected Outcomes  Short Term: Participant verbalizes understanding of the signs/symptoms and immediate care of hyper/hypoglycemia, proper foot care and importance of medication, aerobic/resistive exercise and nutrition plan for blood glucose control.;Long Term: Attainment of HbA1C < 7%.    Heart Failure  Yes    Intervention  Provide a combined exercise and nutrition program that is supplemented with education, support and counseling about heart failure. Directed toward relieving symptoms such as shortness of breath, decreased exercise tolerance, and extremity edema.    Expected Outcomes  Improve functional capacity of life;Short term: Attendance in program 2-3 days a week with increased exercise capacity. Reported lower sodium intake. Reported increased fruit and vegetable intake. Reports medication compliance.;Short term: Daily weights obtained and reported for increase. Utilizing diuretic protocols set by physician.;Long term: Adoption of self-care skills and reduction of barriers for early signs and symptoms recognition and intervention leading to self-care maintenance.    Hypertension  Yes    Intervention  Provide education on lifestyle modifcations including regular physical activity/exercise, weight management, moderate sodium restriction and increased consumption of fresh fruit, vegetables, and low fat dairy, alcohol moderation, and smoking cessation.;Monitor prescription use compliance.    Expected Outcomes  Short Term: Continued assessment and intervention until BP is < 140/926mHG in hypertensive participants. < 130/8029mG in hypertensive participants with diabetes, heart failure or chronic kidney disease.;Long Term: Maintenance of blood pressure at goal levels.     Lipids  Yes    Intervention  Provide education and support for participant on nutrition & aerobic/resistive exercise along with prescribed medications to achieve LDL <67m37mDL >40mg52m Expected Outcomes  Short Term: Participant states understanding of desired cholesterol values and is compliant with medications prescribed. Participant is following exercise prescription and nutrition guidelines.;Long Term: Cholesterol controlled with medications as prescribed, with individualized exercise RX and with personalized nutrition plan. Value goals: LDL < 67mg,5m > 40 mg.       Core Components/Risk Factors/Patient Goals Review:    Core Components/Risk Factors/Patient Goals at Discharge (Final Review):    ITP Comments: ITP Comments    Row Name 06/29/17 1318 07/10/17 1629 07/12/17 0606       ITP Comments  Med Review completed. Initial ITP created. Diagnosis can be found in [facility]19  Eamonn Clabornnted to the ED today from Dr. Singh'Keturah Barree.  He was hypotensive and anemic.  He has also had nausea and diarehha over the past week.  We will continue to follow from afar until clearance recieved to start rehab.   30 Day review. Continue with ITP unless directed changes per Medical Director review.    New to program  Comments:

## 2017-07-13 DIAGNOSIS — N184 Chronic kidney disease, stage 4 (severe): Secondary | ICD-10-CM | POA: Diagnosis not present

## 2017-07-13 DIAGNOSIS — R808 Other proteinuria: Secondary | ICD-10-CM | POA: Diagnosis not present

## 2017-07-13 DIAGNOSIS — R600 Localized edema: Secondary | ICD-10-CM | POA: Diagnosis not present

## 2017-07-13 DIAGNOSIS — E1129 Type 2 diabetes mellitus with other diabetic kidney complication: Secondary | ICD-10-CM | POA: Diagnosis not present

## 2017-07-16 ENCOUNTER — Other Ambulatory Visit: Payer: Self-pay

## 2017-07-16 ENCOUNTER — Emergency Department: Payer: Medicare Other

## 2017-07-16 ENCOUNTER — Inpatient Hospital Stay
Admission: EM | Admit: 2017-07-16 | Discharge: 2017-07-18 | DRG: 291 | Disposition: A | Discharge status: Home / Self Care | Payer: Medicare Other | Attending: Internal Medicine | Admitting: Internal Medicine

## 2017-07-16 ENCOUNTER — Encounter: Payer: Self-pay | Admitting: *Deleted

## 2017-07-16 DIAGNOSIS — N179 Acute kidney failure, unspecified: Secondary | ICD-10-CM | POA: Diagnosis not present

## 2017-07-16 DIAGNOSIS — I130 Hypertensive heart and chronic kidney disease with heart failure and stage 1 through stage 4 chronic kidney disease, or unspecified chronic kidney disease: Secondary | ICD-10-CM | POA: Diagnosis not present

## 2017-07-16 DIAGNOSIS — E785 Hyperlipidemia, unspecified: Secondary | ICD-10-CM | POA: Diagnosis present

## 2017-07-16 DIAGNOSIS — R079 Chest pain, unspecified: Secondary | ICD-10-CM | POA: Diagnosis present

## 2017-07-16 DIAGNOSIS — R748 Abnormal levels of other serum enzymes: Secondary | ICD-10-CM | POA: Diagnosis not present

## 2017-07-16 DIAGNOSIS — D631 Anemia in chronic kidney disease: Secondary | ICD-10-CM | POA: Diagnosis not present

## 2017-07-16 DIAGNOSIS — Z79899 Other long term (current) drug therapy: Secondary | ICD-10-CM

## 2017-07-16 DIAGNOSIS — Z85820 Personal history of malignant melanoma of skin: Secondary | ICD-10-CM

## 2017-07-16 DIAGNOSIS — I248 Other forms of acute ischemic heart disease: Secondary | ICD-10-CM | POA: Diagnosis present

## 2017-07-16 DIAGNOSIS — I129 Hypertensive chronic kidney disease with stage 1 through stage 4 chronic kidney disease, or unspecified chronic kidney disease: Secondary | ICD-10-CM | POA: Diagnosis not present

## 2017-07-16 DIAGNOSIS — D509 Iron deficiency anemia, unspecified: Secondary | ICD-10-CM | POA: Diagnosis present

## 2017-07-16 DIAGNOSIS — I255 Ischemic cardiomyopathy: Secondary | ICD-10-CM | POA: Diagnosis present

## 2017-07-16 DIAGNOSIS — Z7982 Long term (current) use of aspirin: Secondary | ICD-10-CM | POA: Diagnosis not present

## 2017-07-16 DIAGNOSIS — Z87891 Personal history of nicotine dependence: Secondary | ICD-10-CM

## 2017-07-16 DIAGNOSIS — I110 Hypertensive heart disease with heart failure: Secondary | ICD-10-CM | POA: Diagnosis not present

## 2017-07-16 DIAGNOSIS — E1122 Type 2 diabetes mellitus with diabetic chronic kidney disease: Secondary | ICD-10-CM | POA: Diagnosis present

## 2017-07-16 DIAGNOSIS — N184 Chronic kidney disease, stage 4 (severe): Secondary | ICD-10-CM | POA: Diagnosis not present

## 2017-07-16 DIAGNOSIS — I2510 Atherosclerotic heart disease of native coronary artery without angina pectoris: Secondary | ICD-10-CM | POA: Diagnosis present

## 2017-07-16 DIAGNOSIS — I5022 Chronic systolic (congestive) heart failure: Secondary | ICD-10-CM | POA: Diagnosis not present

## 2017-07-16 DIAGNOSIS — N400 Enlarged prostate without lower urinary tract symptoms: Secondary | ICD-10-CM | POA: Diagnosis not present

## 2017-07-16 DIAGNOSIS — R0789 Other chest pain: Secondary | ICD-10-CM

## 2017-07-16 DIAGNOSIS — Z951 Presence of aortocoronary bypass graft: Secondary | ICD-10-CM | POA: Diagnosis not present

## 2017-07-16 DIAGNOSIS — Z7902 Long term (current) use of antithrombotics/antiplatelets: Secondary | ICD-10-CM

## 2017-07-16 DIAGNOSIS — Z955 Presence of coronary angioplasty implant and graft: Secondary | ICD-10-CM

## 2017-07-16 DIAGNOSIS — R0603 Acute respiratory distress: Secondary | ICD-10-CM | POA: Diagnosis not present

## 2017-07-16 DIAGNOSIS — R809 Proteinuria, unspecified: Secondary | ICD-10-CM | POA: Diagnosis not present

## 2017-07-16 DIAGNOSIS — J810 Acute pulmonary edema: Secondary | ICD-10-CM

## 2017-07-16 DIAGNOSIS — Z85828 Personal history of other malignant neoplasm of skin: Secondary | ICD-10-CM

## 2017-07-16 DIAGNOSIS — K219 Gastro-esophageal reflux disease without esophagitis: Secondary | ICD-10-CM | POA: Diagnosis present

## 2017-07-16 DIAGNOSIS — I501 Left ventricular failure: Secondary | ICD-10-CM | POA: Diagnosis not present

## 2017-07-16 DIAGNOSIS — I252 Old myocardial infarction: Secondary | ICD-10-CM | POA: Diagnosis not present

## 2017-07-16 DIAGNOSIS — R0602 Shortness of breath: Secondary | ICD-10-CM

## 2017-07-16 DIAGNOSIS — E1151 Type 2 diabetes mellitus with diabetic peripheral angiopathy without gangrene: Secondary | ICD-10-CM | POA: Diagnosis not present

## 2017-07-16 DIAGNOSIS — I509 Heart failure, unspecified: Secondary | ICD-10-CM

## 2017-07-16 DIAGNOSIS — I5023 Acute on chronic systolic (congestive) heart failure: Secondary | ICD-10-CM | POA: Diagnosis not present

## 2017-07-16 DIAGNOSIS — M109 Gout, unspecified: Secondary | ICD-10-CM | POA: Diagnosis present

## 2017-07-16 LAB — BASIC METABOLIC PANEL
Anion gap: 7 (ref 5–15)
BUN: 51 mg/dL — ABNORMAL HIGH (ref 6–20)
CALCIUM: 8.4 mg/dL — AB (ref 8.9–10.3)
CO2: 18 mmol/L — ABNORMAL LOW (ref 22–32)
CREATININE: 2.97 mg/dL — AB (ref 0.61–1.24)
Chloride: 108 mmol/L (ref 101–111)
GFR calc Af Amer: 23 mL/min — ABNORMAL LOW (ref >60)
GFR, EST NON AFRICAN AMERICAN: 19 mL/min — AB (ref >60)
GLUCOSE: 232 mg/dL — AB (ref 65–99)
Potassium: 5.4 mmol/L — ABNORMAL HIGH (ref 3.5–5.1)
Sodium: 133 mmol/L — ABNORMAL LOW (ref 135–145)

## 2017-07-16 LAB — CBC
HCT: 27.7 % — ABNORMAL LOW (ref 40.0–52.0)
HEMOGLOBIN: 8.6 g/dL — AB (ref 13.0–18.0)
MCH: 27.4 pg (ref 26.0–34.0)
MCHC: 31 g/dL — AB (ref 32.0–36.0)
MCV: 88.3 fL (ref 80.0–100.0)
PLATELETS: 538 10*3/uL — AB (ref 150–440)
RBC: 3.14 MIL/uL — ABNORMAL LOW (ref 4.40–5.90)
RDW: 21.9 % — AB (ref 11.5–14.5)
WBC: 11.5 10*3/uL — ABNORMAL HIGH (ref 3.8–10.6)

## 2017-07-16 LAB — TROPONIN I: TROPONIN I: 0.04 ng/mL — AB (ref <0.03)

## 2017-07-16 MED ORDER — FUROSEMIDE 10 MG/ML IJ SOLN
40.0000 mg | Freq: Once | INTRAMUSCULAR | Status: AC
  Administered 2017-07-16: 40 mg via INTRAVENOUS
  Filled 2017-07-16: qty 4

## 2017-07-16 NOTE — ED Triage Notes (Signed)
Pt to triage via wheelchair.  Pt has chest tightness.  Pt has swelling in lower legs and feet.  Pt has sob.  Cardiac stents placed last month.  Pt alert.

## 2017-07-16 NOTE — ED Provider Notes (Signed)
[facility] Emergency Department Provider Note  ____________________________________________   I have reviewed the triage vital signs and the nursing notes.   HISTORY  Chief Complaint Shortness of breath  History limited by: Not Limited   HPI Thomas [name] is a 72 y.o. male who presents to the emergency department today with primary concern for increasing shortness of breath.  Patient symptoms have been getting progressively worse for the past few days.  The shortness of breath is worse when he lies flat or tries to exert himself.  He has had associated lower extremity swelling.  Symptoms started shortly after his nephrologist took him off of his Lasix given concern for kidney injury.  Patient does have a history of heart failure as well as chronic kidney disease and recently had stent placements.  He denies any significant chest pain although has had some chest pressure.  Denies any recent fevers.   Per medical record review patient has a history of CAD s/p stent placement, CHF, chronic kidney disease.   Past Medical History:  Diagnosis Date  . Allergy   . Arthritis   . CAD (coronary artery disease)   . Diabetes mellitus without complication ([place])   . Erosive esophagitis   . GERD (gastroesophageal reflux disease)   . Gouty arthropathy   . History of colonic polyps   . History of kidney stones   . History of MRSA infection   . Hyperkalemia   . Hyperlipidemia   . Hypertension   . Melanoma ([place])   . Myocardial infarction ([place]) 1986  . Peripheral vascular disease ([place])   . 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 Trigger finger of left hand   . Ureteral tumor     Patient Active Problem List   Diagnosis Date Noted  . Acute on chronic systolic CHF (congestive heart failure), NYHA class 2 ([place]) 06/22/2017  . CKD (chronic kidney disease), stage IV ([place]) 06/20/2017  . CHF (congestive heart failure) ([place]) 06/10/2017  . Systolic  dysfunction 76/19/5093  . Acute on chronic renal failure ([place]) 05/24/2017  . CAD (coronary artery disease) of artery bypass graft 10/25/2016  . Abnormal ankle brachial index (ABI) 10/13/2016  . Intermittent claudication ([place]) 09/30/2016  . PVC (premature ventricular contraction) 09/30/2016  . Hyperlipidemia 09/30/2016  . Aortic atherosclerosis ([place]) 06/17/2016  . Duodenitis 11/24/2014  . Erosive esophagitis 11/24/2014  . Personal history of methicillin resistant Staphylococcus aureus 11/24/2014  . High potassium 11/24/2014  . Melanoma of skin ([place]) 11/24/2014  . Acquired trigger finger 11/24/2014  . 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 DM (diabetes mellitus), type 2 with renal complications ([place]) 26/71/2458  . History of smoking 30 or more pack years 08/26/2009  . Allergic rhinitis 08/06/2006  . 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 History of colon polyps 05/09/2003  . 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 Essential (primary) hypertension 06/09/1995    Past Surgical History:  Procedure Laterality Date  . CATARACT EXTRACTION    . CORONARY ARTERY BYPASS GRAFT  1996  . CORONARY STENT INTERVENTION N/A 05/29/2017   Procedure: CORONARY STENT INTERVENTION;  Surgeon: Isaias Cowman, MD;  Location: [place] [facility];  Service: Cardiovascular;  Laterality: N/A;  . EXCISION Ureteral Tumor     (benign) Dr. Quillian Quince  . EYE SURGERY Bilateral    Cataract Extraction with IOL  . LEFT HEART CATH AND CORONARY ANGIOGRAPHY N/A 10/18/2016   Procedure: Left Heart  Cath and Coronary Angiography;  Surgeon: Isaias Cowman, MD;  Location: [place] [facility];  Service: Cardiovascular;  Laterality: N/A;  . LEFT HEART CATH AND CORONARY ANGIOGRAPHY N/A 05/29/2017   Procedure: LEFT HEART CATH AND CORONARY ANGIOGRAPHY;  Surgeon: Isaias Cowman, MD;  Location: [place] [facility];  Service: Cardiovascular;  Laterality: N/A;  . LOWER  EXTREMITY ANGIOGRAPHY Right 10/31/2016   Procedure: Lower Extremity Angiography;  Surgeon: Algernon Huxley, MD;  Location: [place] [facility];  Service: Cardiovascular;  Laterality: Right;  . LOWER EXTREMITY ANGIOGRAPHY Left 11/14/2016   Procedure: Lower Extremity Angiography;  Surgeon: Algernon Huxley, MD;  Location: [place] [facility];  Service: Cardiovascular;  Laterality: Left;  . LOWER EXTREMITY INTERVENTION  11/14/2016   Procedure: Lower Extremity Intervention;  Surgeon: Algernon Huxley, MD;  Location: [place] [facility];  Service: Cardiovascular;;  . RETINAL DETACHMENT SURGERY Right November 2107    Prior to Admission medications   Medication Sig Start Date End Date Taking? Authorizing Provider  acetaminophen (TYLENOL) 500 MG tablet Take 500 mg by mouth every 8 (eight) hours as needed for mild pain or headache.    [provider]  amLODipine (NORVASC) 5 MG tablet Take 1 tablet (5 mg total) by mouth daily. Patient not taking: Reported on 07/10/2017 05/31/17   Nicholes Mango, MD  aspirin EC 81 MG tablet Take 81 mg by mouth daily.    [provider]  atorvastatin (LIPITOR) 80 MG tablet TAKE ONE TABLET BY MOUTH EVERY DAY 02/21/17   Birdie Sons, MD  b complex vitamins capsule Take 1 capsule by mouth daily.    [provider]  buPROPion (WELLBUTRIN SR) 150 MG 12 hr tablet 1 tablet daily for 3 days, then 1 tablet twice daily. Stop smoking 14 days after starting medication Patient taking differently: Take 150 mg by mouth 2 (two) times daily. 1 tablet daily for 3 days, then 1 tablet twice daily. Stop smoking 14 days after starting medication 06/07/17 10/05/17  Birdie Sons, MD  clopidogrel (PLAVIX) 75 MG tablet Take 1 tablet (75 mg total) by mouth daily. 05/30/17   Nicholes Mango, MD  Ferrous Sulfate (IRON) 325 (65 Fe) MG TABS Take 325 mg by mouth 2 (two) times daily.    [provider]  hydrALAZINE (APRESOLINE) 25 MG tablet Take 1 tablet (25 mg total) by mouth every 8  (eight) hours. 06/14/17   Demetrios Loll, MD  HYDROcodone-acetaminophen (NORCO/VICODIN) 5-325 MG tablet Take 1 tablet by mouth every 6 (six) hours as needed for moderate pain. Patient not taking: Reported on 07/10/2017 05/30/17   Nicholes Mango, MD  isosorbide mononitrate (IMDUR) 60 MG 24 hr tablet Take 1 tablet (60 mg total) by mouth daily. 07/04/17   Birdie Sons, MD  metoprolol succinate (TOPROL-XL) 50 MG 24 hr tablet Take 1 tablet (50 mg total) by mouth daily. Take with or immediately following a meal. 07/04/17   Birdie Sons, MD  pantoprazole (PROTONIX) 40 MG tablet Take 1 tablet (40 mg total) by mouth daily. 06/03/16   Birdie Sons, MD  promethazine (PHENERGAN) 25 MG tablet Take 1 tablet (25 mg total) by mouth every 8 (eight) hours as needed for nausea or vomiting. 06/20/17   Birdie Sons, MD    Allergies Ciprofloxacin  Family History  Problem Relation Age of Onset  . Alzheimer's disease Mother   . Alzheimer's disease Brother   . Lung cancer Paternal Grandfather     Social History Social History  Tobacco Use  . Smoking status: Former Smoker    Packs/day: 1.00    Years: 51.00    Pack years: 51.00    Types: Cigarettes    Last attempt to quit: 06/21/2017    Years since quitting: 0.0  . Smokeless tobacco: Never Used  . Tobacco comment: started smoking at age [age]. Smoked for 50 years  Substance Use Topics  . Alcohol use: No    Alcohol/week: 0.0 oz    Frequency: Never  . Drug use: No    Review of Systems Constitutional: No fever/chills Eyes: No visual changes. ENT: No sore throat. Cardiovascular: Positive for chest pressure. Respiratory: Positive for shortness of breath. Gastrointestinal: No abdominal pain.  No nausea, no vomiting.  No diarrhea.   Genitourinary: Negative for dysuria. Musculoskeletal: Positive for leg swelling. Skin: Negative for rash. Neurological: Negative for headaches, focal weakness or  numbness.  ____________________________________________   PHYSICAL EXAM:  VITAL SIGNS: ED Triage Vitals  Enc Vitals Group     BP 07/16/17 2100 (!) 141/87     Pulse Rate 07/16/17 2100 75     Resp 07/16/17 2100 20     Temp 07/16/17 2100 98.2 F (36.8 C)     Temp Source 07/16/17 2100 Oral     SpO2 07/16/17 2100 99 %     Weight 07/16/17 2057 152 lb (68.9 kg)     Height 07/16/17 2057 5\' 6"  (1.676 m)     Head Circumference --      Peak Flow --      Pain Score 07/16/17 2057 8   Constitutional: Alert and oriented.  Eyes: Conjunctivae are normal.  ENT   Head: Normocephalic and atraumatic.   Nose: No congestion/rhinnorhea.   Mouth/Throat: Mucous membranes are moist.   Neck: No stridor. Hematological/Lymphatic/Immunilogical: No cervical lymphadenopathy. Cardiovascular: Normal rate, regular rhythm.  No murmurs, rubs, or gallops. Respiratory: Normal respiratory effort without tachypnea nor retractions. Breath sounds are clear and equal bilaterally. No wheezes/rales/rhonchi. Gastrointestinal: Soft and non tender. No rebound. No guarding.  Genitourinary: Deferred Musculoskeletal: Normal range of motion in all extremities. 1+ pitting edema bilateral lower extremities.  Neurologic:  Normal speech and language. No gross focal neurologic deficits are appreciated.  Skin:  Skin is warm, dry and intact. No rash noted. Psychiatric: Mood and affect are normal. Speech and behavior are normal. Patient exhibits appropriate insight and judgment.  ____________________________________________    LABS (pertinent positives/negatives)  CBC wbc 11.5, hgb 8.6, plt 538 Trop 0.04 BMP na 133, k 5.4, glu 232, cr 2.97  ____________________________________________   EKG  I, Nance Pear, attending physician, personally viewed and interpreted this EKG  EKG Time: 2057 Rate: 78 Rhythm: sinus rhythm with 1st degree av block Axis: right axis deviation Intervals: qtc 487 QRS: RBBB,  LPFB ST changes: no st elevation Impression: abnormal ekg   ____________________________________________    RADIOLOGY  CXR Possible edema   ____________________________________________   PROCEDURES  Procedures  ____________________________________________   INITIAL IMPRESSION / ASSESSMENT AND PLAN / ED COURSE  Pertinent labs & imaging results that were available during my care of the patient were reviewed by me and considered in my medical decision making (see chart for details).  Patient presented to the emergency department today because of concerns for shortness of breath.  Patient is also been having some leg swelling and chest pressure.  Chest x-ray does show some edema in the lungs.  Differential would be broad including CHF, fluid overload second kidney disease, acute coronary syndrome amongst other  etiologies.  Clinical picture and workup is most concerning for CHF.  Will plan on giving IV Lasix and admission to the hospital service.  Discussed findings and plan with patient family.   ____________________________________________   FINAL CLINICAL IMPRESSION(S) / ED DIAGNOSES  Final diagnoses:  Shortness of breath  Chest pressure  Acute pulmonary edema (HCC)  Heart failure, unspecified HF chronicity, unspecified heart failure type [facility])     Note: This dictation was prepared with Dragon dictation. Any transcriptional errors that result from this process are unintentional     Nance Pear, MD 07/17/17 1600

## 2017-07-16 NOTE — ED Notes (Signed)
Resumed care from iris rn.  Pt alert  Family with pt.  Pt waiting on admission.

## 2017-07-17 ENCOUNTER — Encounter: Payer: Self-pay | Admitting: *Deleted

## 2017-07-17 ENCOUNTER — Other Ambulatory Visit: Payer: Self-pay

## 2017-07-17 ENCOUNTER — Encounter: Payer: Self-pay | Admitting: Family Medicine

## 2017-07-17 ENCOUNTER — Encounter: Payer: Medicare Other | Attending: Cardiology | Admitting: *Deleted

## 2017-07-17 DIAGNOSIS — I248 Other forms of acute ischemic heart disease: Secondary | ICD-10-CM | POA: Diagnosis present

## 2017-07-17 DIAGNOSIS — E785 Hyperlipidemia, unspecified: Secondary | ICD-10-CM | POA: Diagnosis present

## 2017-07-17 DIAGNOSIS — N184 Chronic kidney disease, stage 4 (severe): Secondary | ICD-10-CM | POA: Diagnosis present

## 2017-07-17 DIAGNOSIS — D631 Anemia in chronic kidney disease: Secondary | ICD-10-CM | POA: Diagnosis present

## 2017-07-17 DIAGNOSIS — I214 Non-ST elevation (NSTEMI) myocardial infarction: Secondary | ICD-10-CM | POA: Insufficient documentation

## 2017-07-17 DIAGNOSIS — Z87891 Personal history of nicotine dependence: Secondary | ICD-10-CM | POA: Diagnosis not present

## 2017-07-17 DIAGNOSIS — R0603 Acute respiratory distress: Secondary | ICD-10-CM | POA: Diagnosis present

## 2017-07-17 DIAGNOSIS — R0602 Shortness of breath: Secondary | ICD-10-CM | POA: Diagnosis present

## 2017-07-17 DIAGNOSIS — I252 Old myocardial infarction: Secondary | ICD-10-CM | POA: Diagnosis not present

## 2017-07-17 DIAGNOSIS — Z955 Presence of coronary angioplasty implant and graft: Secondary | ICD-10-CM | POA: Insufficient documentation

## 2017-07-17 DIAGNOSIS — Z79899 Other long term (current) drug therapy: Secondary | ICD-10-CM | POA: Diagnosis not present

## 2017-07-17 DIAGNOSIS — K219 Gastro-esophageal reflux disease without esophagitis: Secondary | ICD-10-CM | POA: Diagnosis present

## 2017-07-17 DIAGNOSIS — N179 Acute kidney failure, unspecified: Secondary | ICD-10-CM | POA: Diagnosis present

## 2017-07-17 DIAGNOSIS — Z7982 Long term (current) use of aspirin: Secondary | ICD-10-CM | POA: Diagnosis not present

## 2017-07-17 DIAGNOSIS — Z85820 Personal history of malignant melanoma of skin: Secondary | ICD-10-CM | POA: Diagnosis not present

## 2017-07-17 DIAGNOSIS — Z85828 Personal history of other malignant neoplasm of skin: Secondary | ICD-10-CM | POA: Diagnosis not present

## 2017-07-17 DIAGNOSIS — N400 Enlarged prostate without lower urinary tract symptoms: Secondary | ICD-10-CM | POA: Diagnosis present

## 2017-07-17 DIAGNOSIS — J439 Emphysema, unspecified: Secondary | ICD-10-CM | POA: Insufficient documentation

## 2017-07-17 DIAGNOSIS — E1122 Type 2 diabetes mellitus with diabetic chronic kidney disease: Secondary | ICD-10-CM | POA: Diagnosis present

## 2017-07-17 DIAGNOSIS — Z7902 Long term (current) use of antithrombotics/antiplatelets: Secondary | ICD-10-CM | POA: Diagnosis not present

## 2017-07-17 DIAGNOSIS — Z951 Presence of aortocoronary bypass graft: Secondary | ICD-10-CM | POA: Diagnosis not present

## 2017-07-17 DIAGNOSIS — I130 Hypertensive heart and chronic kidney disease with heart failure and stage 1 through stage 4 chronic kidney disease, or unspecified chronic kidney disease: Secondary | ICD-10-CM | POA: Diagnosis present

## 2017-07-17 DIAGNOSIS — I2510 Atherosclerotic heart disease of native coronary artery without angina pectoris: Secondary | ICD-10-CM | POA: Diagnosis present

## 2017-07-17 DIAGNOSIS — D509 Iron deficiency anemia, unspecified: Secondary | ICD-10-CM | POA: Diagnosis present

## 2017-07-17 DIAGNOSIS — R079 Chest pain, unspecified: Secondary | ICD-10-CM | POA: Diagnosis present

## 2017-07-17 DIAGNOSIS — M109 Gout, unspecified: Secondary | ICD-10-CM | POA: Diagnosis present

## 2017-07-17 DIAGNOSIS — E1151 Type 2 diabetes mellitus with diabetic peripheral angiopathy without gangrene: Secondary | ICD-10-CM | POA: Diagnosis present

## 2017-07-17 DIAGNOSIS — I5023 Acute on chronic systolic (congestive) heart failure: Secondary | ICD-10-CM | POA: Diagnosis present

## 2017-07-17 DIAGNOSIS — Z48812 Encounter for surgical aftercare following surgery on the circulatory system: Secondary | ICD-10-CM | POA: Insufficient documentation

## 2017-07-17 LAB — CBC
HCT: 24.3 % — ABNORMAL LOW (ref 40.0–52.0)
Hemoglobin: 8.2 g/dL — ABNORMAL LOW (ref 13.0–18.0)
MCH: 29.5 pg (ref 26.0–34.0)
MCHC: 33.9 g/dL (ref 32.0–36.0)
MCV: 87 fL (ref 80.0–100.0)
PLATELETS: 468 10*3/uL — AB (ref 150–440)
RBC: 2.79 MIL/uL — AB (ref 4.40–5.90)
RDW: 22.5 % — ABNORMAL HIGH (ref 11.5–14.5)
WBC: 8.8 10*3/uL (ref 3.8–10.6)

## 2017-07-17 LAB — FERRITIN: Ferritin: 31 ng/mL (ref 24–336)

## 2017-07-17 LAB — BASIC METABOLIC PANEL
ANION GAP: 8 (ref 5–15)
BUN: 51 mg/dL — ABNORMAL HIGH (ref 6–20)
CHLORIDE: 108 mmol/L (ref 101–111)
CO2: 19 mmol/L — ABNORMAL LOW (ref 22–32)
CREATININE: 2.99 mg/dL — AB (ref 0.61–1.24)
Calcium: 8.3 mg/dL — ABNORMAL LOW (ref 8.9–10.3)
GFR calc non Af Amer: 19 mL/min — ABNORMAL LOW (ref >60)
GFR, EST AFRICAN AMERICAN: 22 mL/min — AB (ref >60)
Glucose, Bld: 158 mg/dL — ABNORMAL HIGH (ref 65–99)
POTASSIUM: 5 mmol/L (ref 3.5–5.1)
SODIUM: 135 mmol/L (ref 135–145)

## 2017-07-17 LAB — IRON AND TIBC
Iron: 71 ug/dL (ref 45–182)
Saturation Ratios: 25 % (ref 17.9–39.5)
TIBC: 286 ug/dL (ref 250–450)
UIBC: 215 ug/dL

## 2017-07-17 LAB — GLUCOSE, CAPILLARY: GLUCOSE-CAPILLARY: 121 mg/dL — AB (ref 65–99)

## 2017-07-17 LAB — TROPONIN I: Troponin I: 0.03 ng/mL (ref <0.03)

## 2017-07-17 LAB — VITAMIN B12: VITAMIN B 12: 816 pg/mL (ref 180–914)

## 2017-07-17 MED ORDER — ONDANSETRON HCL 4 MG/2ML IJ SOLN: 4.0000 mg | Freq: Four times a day (QID) | INTRAMUSCULAR | Status: DC | PRN

## 2017-07-17 MED ORDER — TRAZODONE HCL 50 MG PO TABS
25.0000 mg | ORAL_TABLET | Freq: Every evening | ORAL | Status: DC | PRN
  Administered 2017-07-17 (×2): 25 mg via ORAL
  Filled 2017-07-17 (×2): qty 1

## 2017-07-17 MED ORDER — HEPARIN SODIUM (PORCINE) 5000 UNIT/ML IJ SOLN
5000.0000 [IU] | Freq: Three times a day (TID) | INTRAMUSCULAR | Status: DC
  Administered 2017-07-17 – 2017-07-18 (×5): 5000 [IU] via SUBCUTANEOUS
  Filled 2017-07-17 (×5): qty 1

## 2017-07-17 MED ORDER — FERROUS SULFATE 325 (65 FE) MG PO TABS
325.0000 mg | ORAL_TABLET | Freq: Two times a day (BID) | ORAL | Status: DC
  Administered 2017-07-17 – 2017-07-18 (×3): 325 mg via ORAL
  Filled 2017-07-17 (×3): qty 1

## 2017-07-17 MED ORDER — ACETAMINOPHEN 325 MG PO TABS
650.0000 mg | ORAL_TABLET | Freq: Four times a day (QID) | ORAL | Status: DC | PRN
  Administered 2017-07-17: 650 mg via ORAL
  Filled 2017-07-17: qty 2

## 2017-07-17 MED ORDER — BUPROPION HCL ER (SR) 150 MG PO TB12
150.0000 mg | ORAL_TABLET | Freq: Two times a day (BID) | ORAL | Status: DC
  Administered 2017-07-17 – 2017-07-18 (×3): 150 mg via ORAL
  Filled 2017-07-17 (×4): qty 1

## 2017-07-17 MED ORDER — FUROSEMIDE 10 MG/ML IJ SOLN
40.0000 mg | Freq: Two times a day (BID) | INTRAMUSCULAR | Status: DC
  Administered 2017-07-17 – 2017-07-18 (×2): 40 mg via INTRAVENOUS
  Filled 2017-07-17 (×2): qty 4

## 2017-07-17 MED ORDER — ATORVASTATIN CALCIUM 20 MG PO TABS
80.0000 mg | ORAL_TABLET | Freq: Every day | ORAL | Status: DC
  Administered 2017-07-17: 80 mg via ORAL
  Filled 2017-07-17: qty 4

## 2017-07-17 MED ORDER — ACETAMINOPHEN 650 MG RE SUPP: 650.0000 mg | Freq: Four times a day (QID) | RECTAL | Status: DC | PRN

## 2017-07-17 MED ORDER — DOCUSATE SODIUM 100 MG PO CAPS
100.0000 mg | ORAL_CAPSULE | Freq: Two times a day (BID) | ORAL | Status: DC
  Administered 2017-07-17 – 2017-07-18 (×4): 100 mg via ORAL
  Filled 2017-07-17 (×4): qty 1

## 2017-07-17 MED ORDER — TAMSULOSIN HCL 0.4 MG PO CAPS
0.4000 mg | ORAL_CAPSULE | Freq: Every day | ORAL | Status: DC
  Administered 2017-07-17: 0.4 mg via ORAL
  Filled 2017-07-17: qty 1

## 2017-07-17 MED ORDER — PANTOPRAZOLE SODIUM 40 MG PO TBEC
40.0000 mg | DELAYED_RELEASE_TABLET | Freq: Every day | ORAL | Status: DC
  Administered 2017-07-17: 40 mg via ORAL
  Filled 2017-07-17: qty 1

## 2017-07-17 MED ORDER — MIDAZOLAM HCL 2 MG/2ML IJ SOLN: 2.0000 mg | Freq: Once | INTRAMUSCULAR | Status: DC

## 2017-07-17 MED ORDER — ONDANSETRON HCL 4 MG PO TABS: 4.0000 mg | ORAL_TABLET | Freq: Four times a day (QID) | ORAL | Status: DC | PRN

## 2017-07-17 MED ORDER — HYDRALAZINE HCL 25 MG PO TABS
25.0000 mg | ORAL_TABLET | Freq: Three times a day (TID) | ORAL | Status: DC
  Administered 2017-07-17 – 2017-07-18 (×4): 25 mg via ORAL
  Filled 2017-07-17 (×4): qty 1

## 2017-07-17 MED ORDER — FUROSEMIDE 10 MG/ML IJ SOLN
20.0000 mg | Freq: Two times a day (BID) | INTRAMUSCULAR | Status: DC
  Administered 2017-07-17: 20 mg via INTRAVENOUS

## 2017-07-17 MED ORDER — BISACODYL 5 MG PO TBEC: 5.0000 mg | DELAYED_RELEASE_TABLET | Freq: Every day | ORAL | Status: DC | PRN

## 2017-07-17 MED ORDER — FUROSEMIDE 10 MG/ML IJ SOLN
20.0000 mg | Freq: Two times a day (BID) | INTRAMUSCULAR | Status: DC
  Filled 2017-07-17: qty 2

## 2017-07-17 MED ORDER — HYDROCODONE-ACETAMINOPHEN 5-325 MG PO TABS: 1.0000 | ORAL_TABLET | ORAL | Status: DC | PRN

## 2017-07-17 MED ORDER — ISOSORBIDE MONONITRATE ER 60 MG PO TB24
60.0000 mg | ORAL_TABLET | Freq: Every day | ORAL | Status: DC
  Administered 2017-07-17 – 2017-07-18 (×2): 60 mg via ORAL
  Filled 2017-07-17 (×2): qty 1

## 2017-07-17 MED ORDER — VITAMIN B-12 1000 MCG PO TABS
5000.0000 ug | ORAL_TABLET | Freq: Every day | ORAL | Status: DC
  Administered 2017-07-17 – 2017-07-18 (×2): 5000 ug via ORAL
  Filled 2017-07-17 (×2): qty 5

## 2017-07-17 MED ORDER — B COMPLEX VITAMINS PO CAPS: 1.0000 | ORAL_CAPSULE | Freq: Every day | ORAL | Status: DC

## 2017-07-17 MED ORDER — FUROSEMIDE 10 MG/ML IJ SOLN: 20.0000 mg | Freq: Two times a day (BID) | INTRAMUSCULAR | Status: DC

## 2017-07-17 MED ORDER — CLOPIDOGREL BISULFATE 75 MG PO TABS
75.0000 mg | ORAL_TABLET | Freq: Every day | ORAL | Status: DC
  Administered 2017-07-17 – 2017-07-18 (×2): 75 mg via ORAL
  Filled 2017-07-17 (×2): qty 1

## 2017-07-17 MED ORDER — B COMPLEX-C PO TABS
1.0000 | ORAL_TABLET | Freq: Every day | ORAL | Status: DC
  Administered 2017-07-17 – 2017-07-18 (×2): 1 via ORAL
  Filled 2017-07-17 (×2): qty 1

## 2017-07-17 MED ORDER — METOPROLOL SUCCINATE ER 50 MG PO TB24
50.0000 mg | ORAL_TABLET | Freq: Every day | ORAL | Status: DC
  Administered 2017-07-17 – 2017-07-18 (×2): 50 mg via ORAL
  Filled 2017-07-17 (×2): qty 1

## 2017-07-17 MED ORDER — ASPIRIN EC 81 MG PO TBEC
81.0000 mg | DELAYED_RELEASE_TABLET | Freq: Every day | ORAL | Status: DC
  Administered 2017-07-17 – 2017-07-18 (×2): 81 mg via ORAL
  Filled 2017-07-17 (×2): qty 1

## 2017-07-17 NOTE — Plan of Care (Signed)
  Problem: Clinical Measurements: Goal: Ability to maintain clinical measurements within normal limits will improve Outcome: Not Progressing Note:  GFR level today = only 22. Will continue to monitor renal function. Wenda Low [facility]

## 2017-07-17 NOTE — Consult Note (Signed)
Reason for Consult: Shortness of breath congestive heart failure Referring Physician: Dr Duane [place] hospitalist Dr. Juanetta Beets primary Cardiologist Dr. Duayne Cal Thomas [name] is an 72 y.o. male.  HPI: Patient states who presents with complaints of shortness of breath dyspnea leg edema known ischemic cardiomyopathy known coronary disease PCI and stent February 2019 with DES to LAD.  Patient presents with congestive heart failure leg edema shortness of breath.  Started having chest discomfort PND and orthopnea at home.  He had an episode of acute gastroenteritis and became dehydrated several days prior to admission his Lasix was discontinued because of worsening renal insufficiency and dehydration.  Patient started having significant leg edema shortness of breath PND which ultimately presented and patient presented to the emergency room with acute dyspnea he is been sitting up in a chair.  Because of PND and orthopnea patient now here for follow-up this is his second admission in recent few weeks  Past Medical History:  Diagnosis Date  . Allergy   . Arthritis   . CAD (coronary artery disease)   . Diabetes mellitus without complication ([place])   . Erosive esophagitis   . GERD (gastroesophageal reflux disease)   . Gouty arthropathy   . History of colonic polyps   . History of kidney stones   . History of MRSA infection   . Hyperkalemia   . Hyperlipidemia   . Hypertension   . Melanoma ([place])   . Myocardial infarction ([place]) 1986  . Peripheral vascular disease ([place])   . 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 Trigger finger of left hand   . Ureteral tumor     Past Surgical History:  Procedure Laterality Date  . CATARACT EXTRACTION    . CORONARY ARTERY BYPASS GRAFT  1996  . CORONARY STENT INTERVENTION N/A 05/29/2017   Procedure: CORONARY STENT INTERVENTION;  Surgeon: Isaias Cowman, MD;  Location: [place] [facility] [facility];  Service: Cardiovascular;  Laterality: N/A;  .  EXCISION Ureteral Tumor     (benign) Dr. Quillian Quince  . EYE SURGERY Bilateral    Cataract Extraction with IOL  . LEFT HEART CATH AND CORONARY ANGIOGRAPHY N/A 10/18/2016   Procedure: Left Heart Cath and Coronary Angiography;  Surgeon: Isaias Cowman, MD;  Location: [place];  Service: Cardiovascular;  Laterality: N/A;  . LEFT HEART CATH AND CORONARY ANGIOGRAPHY N/A 05/29/2017   Procedure: LEFT HEART CATH AND CORONARY ANGIOGRAPHY;  Surgeon: Isaias Cowman, MD;  Location: [place];  Service: Cardiovascular;  Laterality: N/A;  . LOWER EXTREMITY ANGIOGRAPHY Right 10/31/2016   Procedure: Lower Extremity Angiography;  Surgeon: Algernon Huxley, MD;  Location: [facility];  Service: Cardiovascular;  Laterality: Right;  . LOWER EXTREMITY ANGIOGRAPHY Left 11/14/2016   Procedure: Lower Extremity Angiography;  Surgeon: Algernon Huxley, MD;  Location: [place] [facility];  Service: Cardiovascular;  Laterality: Left;  . LOWER EXTREMITY INTERVENTION  11/14/2016   Procedure: Lower Extremity Intervention;  Surgeon: Algernon Huxley, MD;  Location: [place] [facility];  Service: Cardiovascular;;  . RETINAL DETACHMENT SURGERY Right November 2107    Family History  Problem Relation Age of Onset  . Alzheimer's disease Mother   . Alzheimer's disease Brother   . Lung cancer Paternal Grandfather     Social History:  reports that he quit smoking about 3 weeks ago. His smoking use included cigarettes. He has a 51.00 pack-year smoking history. He has never used smokeless tobacco. He reports that  he does not drink alcohol or use drugs.  Allergies:  Allergies  Allergen Reactions  . Ciprofloxacin Itching and Swelling    Facial swelling     Medications: I have reviewed the patient's current medications.  Results for orders placed or performed during the hospital encounter of 07/16/17 (from the past 48 hour(s))  Basic metabolic panel     Status: Abnormal   Collection Time: 07/16/17   9:02 PM  Result Value Ref Range   Sodium 133 (L) 135 - 145 mmol/L   Potassium 5.4 (H) 3.5 - 5.1 mmol/L   Chloride 108 101 - 111 mmol/L   CO2 18 (L) 22 - 32 mmol/L   Glucose, Bld 232 (H) 65 - 99 mg/dL   BUN 51 (H) 6 - 20 mg/dL   Creatinine, Ser 2.97 (H) 0.61 - 1.24 mg/dL   Calcium 8.4 (L) 8.9 - 10.3 mg/dL   GFR calc non Af Amer 19 (L) >60 mL/min   GFR calc Af Amer 23 (L) >60 mL/min    Comment: (NOTE) The eGFR has been calculated using the CKD EPI equation. This calculation has not been validated in all clinical situations. eGFR's persistently <60 mL/min signify possible Chronic Kidney Disease.    Anion gap 7 5 - 15    Comment: Performed at [facility], [address] [place], [place] [address]  CBC     Status: Abnormal   Collection Time: 07/16/17  9:02 PM  Result Value Ref Range   WBC 11.5 (H) 3.8 - 10.6 K/uL   RBC 3.14 (L) 4.40 - 5.90 MIL/uL   Hemoglobin 8.6 (L) 13.0 - 18.0 g/dL   HCT 27.7 (L) 40.0 - 52.0 %   MCV 88.3 80.0 - 100.0 fL   MCH 27.4 26.0 - 34.0 pg   MCHC 31.0 (L) 32.0 - 36.0 g/dL   RDW 21.9 (H) 11.5 - 14.5 %   Platelets 538 (H) 150 - 440 K/uL    Comment: Performed at [facility], [address] [place], [place] [address]  Troponin I     Status: Abnormal   Collection Time: 07/16/17  9:02 PM  Result Value Ref Range   Troponin I 0.04 (HH) <0.03 ng/mL    Comment: CRITICAL RESULT CALLED TO, READ BACK BY AND VERIFIED WITH LEA FURGURSON AT 2149 ON 07/16/17 RWW Performed at [facility], [address] [place], [place] [address]   Basic metabolic panel     Status: Abnormal   Collection Time: 07/17/17  4:51 AM  Result Value Ref Range   Sodium 135 135 - 145 mmol/L   Potassium 5.0 3.5 - 5.1 mmol/L   Chloride 108 101 - 111 mmol/L   CO2 19 (L) 22 - 32 mmol/L   Glucose, Bld 158 (H) 65 - 99 mg/dL   BUN 51 (H) 6 - 20 mg/dL   Creatinine, Ser 2.99 (H) 0.61 - 1.24 mg/dL   Calcium 8.3 (L) 8.9 - 10.3 mg/dL   GFR calc non Af Amer 19 (L) >60  mL/min   GFR calc Af Amer 22 (L) >60 mL/min    Comment: (NOTE) The eGFR has been calculated using the CKD EPI equation. This calculation has not been validated in all clinical situations. eGFR's persistently <60 mL/min signify possible Chronic Kidney Disease.    Anion gap 8 5 - 15    Comment: Performed at [facility], [address] [place], [place] [address]  CBC     Status: Abnormal   Collection Time:  07/17/17  4:51 AM  Result Value Ref Range   WBC 8.8 3.8 - 10.6 K/uL   RBC 2.79 (L) 4.40 - 5.90 MIL/uL   Hemoglobin 8.2 (L) 13.0 - 18.0 g/dL   HCT 24.3 (L) 40.0 - 52.0 %   MCV 87.0 80.0 - 100.0 fL   MCH 29.5 26.0 - 34.0 pg   MCHC 33.9 32.0 - 36.0 g/dL   RDW 22.5 (H) 11.5 - 14.5 %   Platelets 468 (H) 150 - 440 K/uL    Comment: Performed at [facility], [address] [place], [place] [address]  Troponin I     Status: Abnormal   Collection Time: 07/17/17  4:51 AM  Result Value Ref Range   Troponin I 0.03 (HH) <0.03 ng/mL    Comment: CRITICAL VALUE NOTED. VALUE IS CONSISTENT WITH PREVIOUSLY REPORTED/CALLED VALUE  SDR Performed at [facility], [address] [place], [place] [address]   Ferritin     Status: None   Collection Time: 07/17/17  4:51 AM  Result Value Ref Range   Ferritin 31 24 - 336 ng/mL    Comment: Performed at [facility], [address] [place], [place] [address]  Iron and TIBC     Status: None   Collection Time: 07/17/17  4:51 AM  Result Value Ref Range   Iron 71 45 - 182 ug/dL   TIBC 286 250 - 450 ug/dL   Saturation Ratios 25 17.9 - 39.5 %   UIBC 215 ug/dL    Comment: Performed at [facility], [address] [place], [place] [address]  Vitamin B12     Status: None   Collection Time: 07/17/17  4:51 AM  Result Value Ref Range   Vitamin B-12 816 180 - 914 pg/mL    Comment: (NOTE) This assay is not validated for testing neonatal or myeloproliferative syndrome specimens for Vitamin B12  levels. Performed at [facility], [address] [place], [place] [address]   Glucose, capillary     Status: Abnormal   Collection Time: 07/17/17  8:09 AM  Result Value Ref Range   Glucose-Capillary 121 (H) 65 - 99 mg/dL    Dg Chest 2 View  Result Date: 07/16/2017 CLINICAL DATA:  Chest pain.  Shortness of breath. EXAM: CHEST - 2 VIEW COMPARISON:  Radiograph of July 10, 2017. FINDINGS: Stable cardiomegaly. Status post coronary artery bypass graft. No pneumothorax or pleural effusion is noted. Mild interstitial densities are noted in both lung bases concerning for pulmonary edema. The visualized skeletal structures are unremarkable. IMPRESSION: Possible minimal bilateral basilar edema. Electronically Signed   By: James  Green Jr, M.D.   On: 07/16/2017 21:47    Review of Systems  Constitutional: Positive for diaphoresis and malaise/fatigue.  HENT: Positive for congestion.   Eyes: Negative.   Respiratory: Positive for shortness of breath.   Cardiovascular: Positive for chest pain, orthopnea, leg swelling and PND.  Gastrointestinal: Negative.   Genitourinary: Negative.   Musculoskeletal: Negative.   Neurological: Negative.   Endo/Heme/Allergies: Negative.   Psychiatric/Behavioral: Negative.    Blood pressure 129/69, pulse 63, temperature (!) 97.5 F (36.4 C), temperature source Oral, resp. rate 17, height 5' 6" (1.676 m), weight 157 lb 6.4 oz (71.4 kg), SpO2 99 %. Physical Exam  Nursing note reviewed. Constitutional: He is oriented to person, place, and time. He appears well-developed and well-nourished.  HENT:  Head: Normocephalic and atraumatic.  Eyes: Pupils are equal, round, and reactive to light. Conjunctivae and   EOM are normal.  Neck: Normal range of motion. Neck supple.  Cardiovascular: Normal rate and regular rhythm. Exam reveals gallop.  Murmur heard. Respiratory: Effort normal and breath sounds normal.  GI: Soft. Bowel sounds are normal.  Musculoskeletal: Normal  range of motion.  Neurological: He is alert and oriented to person, place, and time. He has normal reflexes.  Skin: Skin is warm and dry.  Psychiatric: He has a normal mood and affect.    Assessment/Plan: Congestive heart failure systolic dysfunction Ischemic cardiomyopathy ejection fraction between 3540% Shortness of breath Possible angina Hypertension Diabetes type 2 Hyperlipidemia Peripheral vascular disease Coronary artery disease GERD Acute gastroenteritis Dehydration  leg edema PCI and stent to LAD February 2019 CABG 1996 Chronic renal insufficiency . Plan Agree with admit to telemetry Rule out for myocardial infarction Follow-up EKGs and telemetry Consider supplemental oxygen therapy Resume diuretic therapy intravenously Recommend support stockings and elevation Recommend avoid salt intake Continue Lipitor for lipid management Agree with nephrology input Increase activity At the patient follow-up with heart failure clinic   Dwayne D Callwood 07/17/2017, 10:32 PM     

## 2017-07-17 NOTE — ED Notes (Signed)
Report called to [place] rn floor nurse  

## 2017-07-17 NOTE — Progress Notes (Signed)
Patient ID: Thomas [name], male   DOB: 06-Jul-1945, 72 y.o.   MRN: [id number]   ACP Note  Patient and family at the bedside  Diagnosis: Acute on chronic systolic congestive heart failure, acute respiratory distress, elevated troponin with demand ischemia, CAD, chronic kidney disease stage IV, iron deficiency anemia, essential hypertension, BPH.  Plan.  Patient needs to be on his diuretic to remove fluid and keep fluid moving in the right direction.  We have a tight balance of being able to breathe and his kidney function.  Low-salt diet discussed.  Start Flomax for BPH.  Start iron for anemia.  Patient did have a colonoscopy 4 years ago.  Patient on aspirin and Plavix for CAD.  Hopefully can stop Plavix with stent being over 1 year ago but will await cardiology opinion.   Time spent on ACP discussion 35 minutes  Dr. Loletha Grayer

## 2017-07-17 NOTE — Progress Notes (Signed)
[facility]  ROUNDING NOTE   Subjective:   [name] admitted to [facility] on 07/16/2017 for Shortness of breath [R06.02] Acute pulmonary edema ([place]) [J81.0] Chest pressure [R07.89] Heart failure, unspecified HF chronicity, unspecified heart failure type ([place]) [I50.9]  Patient was told to hold his furosemide last week. However now with chest pain and acute exacerbation of congestive heart failure.   Wife and daughters at bedside.   Objective:  Vital signs in last 24 hours:  Temp:  [97.7 F (36.5 C)-98.2 F (36.8 C)] 97.9 F (36.6 C) (04/01 0812) Pulse Rate:  [67-82] 75 (04/01 0812) Resp:  [13-20] 18 (04/01 0812) BP: (130-152)/(75-114) 144/83 (04/01 0812) SpO2:  [96 %-100 %] 96 % (04/01 0812) Weight:  [68.9 kg (152 lb)-71.4 kg (157 lb 6.4 oz)] 71.4 kg (157 lb 6.4 oz) (04/01 0104)  Weight change:  Filed Weights   07/16/17 2057 07/17/17 0104  Weight: 68.9 kg (152 lb) 71.4 kg (157 lb 6.4 oz)    Intake/Output: I/O last 3 completed shifts: In: -  Out: 901 [Urine:900; Stool:1]   Intake/Output this shift:  Total I/O In: 120 [P.O.:120] Out: 600 [Urine:600]  Physical Exam: General: NAD,   Head: Normocephalic, atraumatic. Moist oral mucosal membranes  Eyes: Anicteric, PERRL  Neck: Supple, trachea midline  Lungs:  Bibasilar crackles  Heart: Regular rate and rhythm  Abdomen:  Soft, nontender,   Extremities: ++ peripheral edema.  Neurologic: Nonfocal, moving all four extremities  Skin: No lesions       Basic Metabolic Panel: Recent Labs  Lab 07/16/17 2102 07/17/17 0451  NA 133* 135  K 5.4* 5.0  CL 108 108  CO2 18* 19*  GLUCOSE 232* 158*  BUN 51* 51*  CREATININE 2.97* 2.99*  CALCIUM 8.4* 8.3*    Liver Function Tests: No results for input(s): AST, ALT, ALKPHOS, BILITOT, PROT, ALBUMIN in the last 168 hours. No results for input(s): LIPASE, AMYLASE in the last 168 hours. No results for input(s): AMMONIA in the last 168 hours.  CBC: Recent  Labs  Lab 07/10/17 1946 07/16/17 2102 07/17/17 0451  WBC  --  11.5* 8.8  HGB 8.2* 8.6* 8.2*  HCT 25.7* 27.7* 24.3*  MCV  --  88.3 87.0  PLT  --  538* 468*    Cardiac Enzymes: Recent Labs  Lab 07/10/17 1818 07/16/17 2102 07/17/17 0451  TROPONINI 0.05* 0.04* 0.03*    BNP: Invalid input(s): POCBNP  CBG: Recent Labs  Lab 07/17/17 0809  GLUCAP 121*    Microbiology: Results for orders placed or performed in visit on 07/03/17  Urine Culture     Status: None   Collection Time: 07/03/17 12:14 PM  Result Value Ref Range Status   Urine Culture, Routine Final report  Final   Organism ID, Bacteria Comment  Final    Comment: Mixed urogenital flora Less than 10,000 colonies/mL     Coagulation Studies: No results for input(s): LABPROT, INR in the last 72 hours.  Urinalysis: No results for input(s): COLORURINE, LABSPEC, PHURINE, GLUCOSEU, HGBUR, BILIRUBINUR, KETONESUR, PROTEINUR, UROBILINOGEN, NITRITE, LEUKOCYTESUR in the last 72 hours.  Invalid input(s): APPERANCEUR    Imaging: Dg Chest 2 View  Result Date: 07/16/2017 CLINICAL DATA:  Chest pain.  Shortness of breath. EXAM: CHEST - 2 VIEW COMPARISON:  Radiograph of July 10, 2017. FINDINGS: Stable cardiomegaly. Status post coronary artery bypass graft. No pneumothorax or pleural effusion is noted. Mild interstitial densities are noted in both lung bases concerning for pulmonary edema. The visualized skeletal  structures are unremarkable. IMPRESSION: Possible minimal bilateral basilar edema. Electronically Signed   By: Marijo Conception, M.D.   On: 07/16/2017 21:47     Medications:    . aspirin EC  81 mg Oral Daily  . atorvastatin  80 mg Oral Daily  . B-complex with vitamin C  1 tablet Oral Daily  . buPROPion  150 mg Oral BID  . clopidogrel  75 mg Oral Daily  . docusate sodium  100 mg Oral BID  . ferrous sulfate  325 mg Oral BID  . furosemide  40 mg Intravenous Q12H  . heparin  5,000 Units Subcutaneous Q8H  .  hydrALAZINE  25 mg Oral Q8H  . isosorbide mononitrate  60 mg Oral Daily  . metoprolol succinate  50 mg Oral Daily  . pantoprazole  40 mg Oral Daily  . tamsulosin  0.4 mg Oral QPC supper  . vitamin B-12  5,000 mcg Oral Daily   acetaminophen **OR** acetaminophen, bisacodyl, HYDROcodone-acetaminophen, ondansetron **OR** ondansetron (ZOFRAN) IV, traZODone  Assessment/ Plan:  [name] is a 72 y.o. white male with diabetes mellitus type II, hypertension, coronary artery disease, peripheral arterial disease, BPH, hyperlipidemia, congestive heart failure.  1. 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 Baseline creatinine of 2.8, GFR of 20.  Chronic kidney disease is thought to be secondary to diabetes however glomeronephritis is a possibility.  Acute renal failure from acute exacerbation of congestive heart failure  2. Hypertension and acute exacerbation of systolic congestive heart failure. Echo from 2/7 with EF of 35-40%.  - furosemide 40mg  IV q12 - Continue tamsulosin, metoprolol, imdur, hydralazine,   3. Anemia of chronic kidney disease: hemoglobin 8.2.  - Discussed EPO with patient.    LOS: 0 [name] 4/1/20193:11 PM

## 2017-07-17 NOTE — Care Management (Signed)
Patient presented with chest pain and respiratory difficulty. Room air.  Being treated for exac of CHF and borderline troponin. Stool for occult blood pending.  Hemoglobin is 8.2 and 8.6.  Patient declined need for any home health follow up at last discharge.   Patient was to follow with nephrology for stage 3 kidney disease rather than the heart failure clinic. Readmit with heart failure.  It is documented patient was instructed to hold his lasix.  Is not requiring supplemental oxygen

## 2017-07-17 NOTE — H&P (Signed)
[facility] [place] NAME: Thomas [name]    MR#:  [id number]  DATE OF BIRTH:  October 02, 1945  DATE OF ADMISSION:  07/16/2017  PRIMARY CARE PHYSICIAN: Birdie Sons, MD   REQUESTING/REFERRING PHYSICIAN:   CHIEF COMPLAINT:   Chief Complaint  Patient presents with  . Chest Pain    HISTORY OF PRESENT ILLNESS: Thomas [name]  is a 72 y.o. male with a known history of coronary artery disease, hypertension, hyperlipidemia, peripheral vascular disease, chronic kidney disease and other many comorbidities. Patient presented to emergency room for acute onset of chest tightness, shortness of breath and lower extremities edema going on for the past 2 days, gradually getting worse.  His symptoms are worse with prolonged standing and exertion.  Oxygen saturation was 96% on room air, the arrival to emergency room. Blood test done emergency room are remarkable for borderline elevated troponin level at 0.04 and potassium at 5.4.  Creatinine level is elevated at 3.4.  Chest x-ray, reviewed by myself, shows  bilateral basilar edema.  EKG is noted without any acute ST-T changes. Patient is admitted for further evaluation and treatment.   PAST MEDICAL HISTORY:   Past Medical History:  Diagnosis Date  . Allergy   . Arthritis   . CAD (coronary artery disease)   . Diabetes mellitus without complication ([place])   . Erosive esophagitis   . GERD (gastroesophageal reflux disease)   . Gouty arthropathy   . History of colonic polyps   . History of kidney stones   . History of MRSA infection   . Hyperkalemia   . Hyperlipidemia   . Hypertension   . Melanoma ([place])   . Myocardial infarction ([place]) 1986  . Peripheral vascular disease ([place])   . 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 Trigger finger of left hand   . Ureteral tumor     PAST SURGICAL HISTORY:  Past Surgical History:  Procedure Laterality Date  . CATARACT EXTRACTION    .  CORONARY ARTERY BYPASS GRAFT  1996  . CORONARY STENT INTERVENTION N/A 05/29/2017   Procedure: CORONARY STENT INTERVENTION;  Surgeon: Isaias Cowman, MD;  Location: [facility];  Service: Cardiovascular;  Laterality: N/A;  . EXCISION Ureteral Tumor     (benign) Dr. Quillian Quince  . EYE SURGERY Bilateral    Cataract Extraction with IOL  . LEFT HEART CATH AND CORONARY ANGIOGRAPHY N/A 10/18/2016   Procedure: Left Heart Cath and Coronary Angiography;  Surgeon: Isaias Cowman, MD;  Location: [facility];  Service: Cardiovascular;  Laterality: N/A;  . LEFT HEART CATH AND CORONARY ANGIOGRAPHY N/A 05/29/2017   Procedure: LEFT HEART CATH AND CORONARY ANGIOGRAPHY;  Surgeon: Isaias Cowman, MD;  Location: [facility];  Service: Cardiovascular;  Laterality: N/A;  . LOWER EXTREMITY ANGIOGRAPHY Right 10/31/2016   Procedure: Lower Extremity Angiography;  Surgeon: Algernon Huxley, MD;  Location: [facility];  Service: Cardiovascular;  Laterality: Right;  . LOWER EXTREMITY ANGIOGRAPHY Left 11/14/2016   Procedure: Lower Extremity Angiography;  Surgeon: Algernon Huxley, MD;  Location: [facility];  Service: Cardiovascular;  Laterality: Left;  . LOWER EXTREMITY INTERVENTION  11/14/2016   Procedure: Lower Extremity Intervention;  Surgeon: Algernon Huxley, MD;  Location: [facility];  Service: Cardiovascular;;  . RETINAL DETACHMENT SURGERY Right November 2107    SOCIAL HISTORY:  Social History   Tobacco Use  . Smoking status: Former  Smoker    Packs/day: 1.00    Years: 51.00    Pack years: 51.00    Types: Cigarettes    Last attempt to quit: 06/21/2017    Years since quitting: 0.0  . Smokeless tobacco: Never Used  . Tobacco comment: started smoking at age [age]. Smoked for 50 years  Substance Use Topics  . Alcohol use: No    Alcohol/week: 0.0 oz    Frequency: Never    FAMILY HISTORY:  Family History  Problem Relation Age of Onset  . Alzheimer's disease  Mother   . Alzheimer's disease Brother   . Lung cancer Paternal Grandfather     DRUG ALLERGIES:  Allergies  Allergen Reactions  . Ciprofloxacin Itching and Swelling    Facial swelling     REVIEW OF SYSTEMS:   CONSTITUTIONAL: No fever, but patient complains of fatigue  weakness.  EYES: No blurred or double vision.  EARS, NOSE, AND THROAT: No tinnitus or ear pain.  RESPIRATORY: Positive for shortness of breath with exertion; no cough, wheezing or hemoptysis.  CARDIOVASCULAR: Positive for chest pain, and lower extremities edema.  GASTROINTESTINAL: No nausea, vomiting, diarrhea or abdominal pain.  GENITOURINARY: No dysuria, hematuria.  ENDOCRINE: No polyuria, nocturia,  HEMATOLOGY: No bleeding SKIN: No rash or lesion. MUSCULOSKELETAL: No joint pain or arthritis.   NEUROLOGIC: No focal weakness.  PSYCHIATRY: No anxiety or depression.   MEDICATIONS AT HOME:  Prior to Admission medications   Medication Sig Start Date End Date Taking? Authorizing Provider  acetaminophen (TYLENOL) 500 MG tablet Take 500 mg by mouth every 8 (eight) hours as needed for mild pain or headache.   Yes [provider]  aspirin EC 81 MG tablet Take 81 mg by mouth daily.   Yes [provider]  atorvastatin (LIPITOR) 80 MG tablet TAKE ONE TABLET BY MOUTH EVERY DAY 02/21/17  Yes Birdie Sons, MD  buPROPion [facility] SR) 150 MG 12 hr tablet 1 tablet daily for 3 days, then 1 tablet twice daily. Stop smoking 14 days after starting medication Patient taking differently: Take 150 mg by mouth 2 (two) times daily. 1 tablet daily for 3 days, then 1 tablet twice daily. Stop smoking 14 days after starting medication 06/07/17 10/05/17 Yes Birdie Sons, MD  clopidogrel (PLAVIX) 75 MG tablet Take 1 tablet (75 mg total) by mouth daily. 05/30/17  Yes Gouru, Illene Silver, MD  Ferrous Sulfate (IRON) 325 (65 Fe) MG TABS Take 325 mg by mouth 2 (two) times daily.   Yes [provider]  furosemide (LASIX) 40  MG tablet Take 40 mg by mouth 2 (two) times daily as needed.   Yes [provider]  hydrALAZINE (APRESOLINE) 25 MG tablet Take 1 tablet (25 mg total) by mouth every 8 (eight) hours. 06/14/17  Yes Demetrios Loll, MD  isosorbide mononitrate (IMDUR) 60 MG 24 hr tablet Take 1 tablet (60 mg total) by mouth daily. 07/04/17  Yes Birdie Sons, MD  metoprolol succinate (TOPROL-XL) 50 MG 24 hr tablet Take 1 tablet (50 mg total) by mouth daily. Take with or immediately following a meal. 07/04/17  Yes Birdie Sons, MD  pantoprazole (PROTONIX) 40 MG tablet Take 1 tablet (40 mg total) by mouth daily. Patient taking differently: Take 40 mg by mouth 2 (two) times daily.  06/03/16  Yes Birdie Sons, MD  vitamin B-12 (CYANOCOBALAMIN) 1000 MCG tablet Take 5,000 mcg by mouth daily.   Yes [provider]  amLODipine (NORVASC) 5 MG tablet Take 1  tablet (5 mg total) by mouth daily. Patient not taking: Reported on 07/10/2017 05/31/17   Nicholes Mango, MD  b complex vitamins capsule Take 1 capsule by mouth daily.    [provider]  HYDROcodone-acetaminophen (NORCO/VICODIN) 5-325 MG tablet Take 1 tablet by mouth every 6 (six) hours as needed for moderate pain. Patient not taking: Reported on 07/10/2017 05/30/17   Nicholes Mango, MD  promethazine (PHENERGAN) 25 MG tablet Take 1 tablet (25 mg total) by mouth every 8 (eight) hours as needed for nausea or vomiting. Patient not taking: Reported on 07/16/2017 06/20/17   Birdie Sons, MD      PHYSICAL EXAMINATION:   VITAL SIGNS: Blood pressure 140/83, pulse 72, temperature 98 F (36.7 C), temperature source Oral, resp. rate 16, height 5\' 6"  (1.676 m), weight 71.4 kg (157 lb 6.4 oz), SpO2 100 %.  GENERAL:  72 y.o.-year-old patient lying in the bed, in mild respiratory distress.  EYES: Pupils equal, round, reactive to light and accommodation. No scleral icterus.  HEENT: Head atraumatic, normocephalic. Oropharynx and nasopharynx clear.  NECK:  Supple,  no jugular venous distention. No thyroid enlargement, no tenderness.  LUNGS: Reduced breath sounds bilaterally, no wheezing.  Bibasilar crackles are noted.  No use of accessory muscles of respiration.  CARDIOVASCULAR: S1, S2 normal. No S3/S4.  ABDOMEN: Soft, nontender, nondistended. Bowel sounds present. No organomegaly or mass.  EXTREMITIES: No pedal edema, cyanosis, or clubbing.  NEUROLOGIC: No focal weakness.  Gait not checked, due to patient's generalized weakness.  PSYCHIATRIC: The patient is alert and oriented x 3.  SKIN: No obvious rash, lesion, or ulcer.   LABORATORY PANEL:   CBC Recent Labs  Lab 07/10/17 1511 07/10/17 1946 07/16/17 2102  WBC 12.0*  --  11.5*  HGB 9.8* 8.2* 8.6*  HCT 30.2* 25.7* 27.7*  PLT 781*  --  538*  MCV 84.1  --  88.3  MCH 27.2  --  27.4  MCHC 32.3  --  31.0*  RDW 16.9*  --  21.9*  LYMPHSABS 1.1  --   --   MONOABS 0.9  --   --   EOSABS 0.3  --   --   BASOSABS 0.1  --   --    ------------------------------------------------------------------------------------------------------------------  Chemistries  Recent Labs  Lab 07/10/17 1511 07/16/17 2102  NA 132* 133*  K 4.8 5.4*  CL 99* 108  CO2 19* 18*  GLUCOSE 282* 232*  BUN 55* 51*  CREATININE 3.40* 2.97*  CALCIUM 9.8 8.4*  AST 24  --   ALT 21  --   ALKPHOS 123  --   BILITOT 0.4  --    ------------------------------------------------------------------------------------------------------------------ estimated creatinine clearance is 20 mL/min (A) (by C-G formula based on SCr of 2.97 mg/dL (H)). ------------------------------------------------------------------------------------------------------------------ No results for input(s): TSH, T4TOTAL, T3FREE, THYROIDAB in the last 72 hours.  Invalid input(s): FREET3   Coagulation profile No results for input(s): INR, PROTIME in the last 168  hours. ------------------------------------------------------------------------------------------------------------------- No results for input(s): DDIMER in the last 72 hours. -------------------------------------------------------------------------------------------------------------------  Cardiac Enzymes Recent Labs  Lab 07/10/17 1511 07/10/17 1818 07/16/17 2102  TROPONINI 0.05* 0.05* 0.04*   ------------------------------------------------------------------------------------------------------------------ Invalid input(s): POCBNP  ---------------------------------------------------------------------------------------------------------------  Urinalysis    Component Value Date/Time   COLORURINE YELLOW (A) 07/10/2017 1818   APPEARANCEUR CLEAR (A) 07/10/2017 1818   APPEARANCEUR Clear 04/01/2014 1715   LABSPEC 1.011 07/10/2017 1818   LABSPEC 1.008 04/01/2014 1715   PHURINE 6.0 07/10/2017 1818   GLUCOSEU 150 (A) 07/10/2017 1818   GLUCOSEU  50 mg/dL 04/01/2014 1715   HGBUR NEGATIVE 07/10/2017 1818   BILIRUBINUR NEGATIVE 07/10/2017 1818   BILIRUBINUR negative 07/03/2017 1157   BILIRUBINUR Negative 04/01/2014 [place] 07/10/2017 1818   PROTEINUR 100 (A) 07/10/2017 1818   UROBILINOGEN 0.2 07/03/2017 1157   NITRITE NEGATIVE 07/10/2017 1818   LEUKOCYTESUR NEGATIVE 07/10/2017 1818   LEUKOCYTESUR Negative 04/01/2014 1715     RADIOLOGY: Dg Chest 2 View  Result Date: 07/16/2017 CLINICAL DATA:  Chest pain.  Shortness of breath. EXAM: CHEST - 2 VIEW COMPARISON:  Radiograph of July 10, 2017. FINDINGS: Stable cardiomegaly. Status post coronary artery bypass graft. No pneumothorax or pleural effusion is noted. Mild interstitial densities are noted in both lung bases concerning for pulmonary edema. The visualized skeletal structures are unremarkable. IMPRESSION: Possible minimal bilateral basilar edema. Electronically Signed   By: Marijo Conception, M.D.   On: 07/16/2017  21:47    EKG: Orders placed or performed during the hospital encounter of 07/16/17  . ED EKG within 10 minutes  . ED EKG within 10 minutes    IMPRESSION AND PLAN:  1.  Acute respiratory distress, likely secondary to acute systolic CHF exacerbation.  We will start gentle diuresis with IV Lasix, monitoring closely the kidney function.  Strict I/Os monitoring.  Oxygen therapy.  Continue beta-blocker, but avoid ACE inhibitor due to hyperkalemia.  Cardiology is consulted for further evaluation and management. 2.  Acute systolic CHF exacerbation.  Most recent echocardiogram, within 2 months, showed ejection fraction around 35-40%.  We will continue medical treatment, as stated above under #1. 3.  Non-ST elevation MI.  Troponin level is borderline elevated at 0.04 likely related to demand ischemia, from CHF exacerbation.  We will continue to monitor patient on telemetry and follow the troponin level, to rule out ACS. 4.  Acute renal failure on CKD 3.  Creatinine is elevated at 3.4.  This is likely related to acute CHF exacerbation.  We will continue to monitor kidney function closely during the diuresis with Lasix.  Avoid nephrotoxic medications. 5.  Hypertension, stable, restart home medications.   All the records are reviewed and case discussed with ED provider. Management plans discussed with the patient, who is in agreement.  CODE STATUS:    Code Status Orders  (From admission, onward)        Start     Ordered   07/17/17 0100  Full code  Continuous     07/17/17 0059    Code Status History    Date Active Date Inactive Code Status Order ID Comments User Context   06/10/2017 1610 06/14/2017 1308 Full Code [id number]  Henreitta Leber, MD Inpatient   05/25/2017 0024 05/30/2017 1325 Full Code [id number]  Lance Coon, MD Inpatient   11/14/2016 0938 11/14/2016 1442 Full Code [id number]  Algernon Huxley, MD Inpatient   10/31/2016 1121 10/31/2016 1817 Full Code [id number]  Algernon Huxley, MD Inpatient    10/18/2016 1357 10/18/2016 1850 Full Code [id number]  Isaias Cowman, MD Inpatient       TOTAL TIME TAKING CARE OF THIS PATIENT: 45 minutes.    Amelia Jo M.D on 07/17/2017 at 3:45 AM  Between 7am to 6pm - Pager - [phone]  After 6pm go to [email or web address] - password [email or web address] [facility]  Office  [phone]  CC: Primary care physician; Birdie Sons, MD

## 2017-07-17 NOTE — Progress Notes (Signed)
Patient is admitted with the diagnosis of chest pain. Alert and oriented x 4. No respiratory distress noted.  The patient is oriented to  his room, staff and ascom. Patient voiced no concern. Will continue to monitor.

## 2017-07-17 NOTE — Progress Notes (Signed)
Inpatient Diabetes Program Recommendations  AACE/ADA: New Consensus Statement on Inpatient Glycemic Control (2015)  Target Ranges:  Prepandial:   less than 140 mg/dL      Peak postprandial:   less than 180 mg/dL (1-2 hours)      Critically ill patients:  140 - 180 mg/dL  Results for Thomas [name], BART (MRN [id number]) as of 07/17/2017 09:22  Ref. Range 07/17/2017 08:09  Glucose-Capillary Latest Ref Range: 65 - 99 mg/dL 121 (H)   Results for Thomas [name], KREJCI (MRN [id number]) as of 07/17/2017 09:22  Ref. Range 07/16/2017 21:02 07/17/2017 04:51  Glucose Latest Ref Range: 65 - 99 mg/dL 232 (H) 158 (H)   Results for Thomas [name], STROTHMAN (MRN [id number]) as of 07/17/2017 09:22  Ref. Range 01/31/2017 08:27 06/30/2017 08:16  Hemoglobin A1C Latest Ref Range: 4.8 - 5.6 % 7.6 7.6 (H)    Review of Glycemic Control  Diabetes history: DM2 Outpatient Diabetes medications: None Current orders for Inpatient glycemic control: None  Inpatient Diabetes Program Recommendations:  Correction ([facility]): While inpatient, please consider ordering CBGs with Novolog 0-9 units TID with meals and 0-5 units QHS. Outpatient DM control:  In reviewing chart, noted patient was taking Glipizide XL 10 mg daily as an outpatient. Patient was inpatient from 06/10/17 to 06/14/17 and Glipizide XL was discontinued (per discharge summary on 06/14/17). A1C 7.5% on 06/30/17. Patient likely needs oral DM medication prescribed at time of discharge for DM control.  Thanks, Barnie Alderman, RN, MSN, CDE Diabetes Coordinator Inpatient Diabetes Program [phone] (Team Pager from 8am to 5pm)

## 2017-07-18 LAB — BASIC METABOLIC PANEL
ANION GAP: 8 (ref 5–15)
BUN: 51 mg/dL — AB (ref 6–20)
CHLORIDE: 106 mmol/L (ref 101–111)
CO2: 21 mmol/L — ABNORMAL LOW (ref 22–32)
Calcium: 8.6 mg/dL — ABNORMAL LOW (ref 8.9–10.3)
Creatinine, Ser: 3.2 mg/dL — ABNORMAL HIGH (ref 0.61–1.24)
GFR calc Af Amer: 21 mL/min — ABNORMAL LOW (ref >60)
GFR calc non Af Amer: 18 mL/min — ABNORMAL LOW (ref >60)
Glucose, Bld: 256 mg/dL — ABNORMAL HIGH (ref 65–99)
Potassium: 5 mmol/L (ref 3.5–5.1)
Sodium: 135 mmol/L (ref 135–145)

## 2017-07-18 LAB — OCCULT BLOOD X 1 CARD TO LAB, STOOL: FECAL OCCULT BLD: NEGATIVE

## 2017-07-18 LAB — HEMOGLOBIN: HEMOGLOBIN: 8.6 g/dL — AB (ref 13.0–18.0)

## 2017-07-18 LAB — PSA: PROSTATIC SPECIFIC ANTIGEN: 0.85 ng/mL (ref 0.00–4.00)

## 2017-07-18 LAB — GLUCOSE, CAPILLARY: Glucose-Capillary: 171 mg/dL — ABNORMAL HIGH (ref 65–99)

## 2017-07-18 MED ORDER — IRON SUCROSE 20 MG/ML IV SOLN
200.0000 mg | Freq: Once | INTRAVENOUS | Status: AC
  Administered 2017-07-18: 200 mg via INTRAVENOUS
  Filled 2017-07-18: qty 10

## 2017-07-18 MED ORDER — FUROSEMIDE 40 MG PO TABS: 40.0000 mg | ORAL_TABLET | Freq: Two times a day (BID) | ORAL | Status: DC

## 2017-07-18 MED ORDER — BUPROPION HCL ER (SR) 150 MG PO TB12: 150.0000 mg | ORAL_TABLET | Freq: Two times a day (BID) | ORAL | Status: DC

## 2017-07-18 MED ORDER — FUROSEMIDE 40 MG PO TABS: 40.0000 mg | ORAL_TABLET | Freq: Two times a day (BID) | ORAL | 0 refills | Status: DC

## 2017-07-18 MED ORDER — TAMSULOSIN HCL 0.4 MG PO CAPS: 0.4000 mg | ORAL_CAPSULE | Freq: Every day | ORAL | 0 refills | Status: DC

## 2017-07-18 NOTE — Discharge Summary (Signed)
[facility] NAME: Thomas [name]    MR#:  [id number]  DATE OF BIRTH:  [birth year]  DATE OF ADMISSION:  07/16/2017 ADMITTING PHYSICIAN: Amelia Jo, MD  DATE OF DISCHARGE: 07/18/2017  3:26 PM  PRIMARY CARE PHYSICIAN: Birdie Sons, MD    ADMISSION DIAGNOSIS:  Shortness of breath [R06.02] Acute pulmonary edema ([place]) [J81.0] Chest pressure [R07.89] Heart failure, unspecified HF chronicity, unspecified heart failure type ([place]) [I50.9]  DISCHARGE DIAGNOSIS:  Active Problems:   Chest pain   SECONDARY DIAGNOSIS:   Past Medical History:  Diagnosis Date  . Allergy   . Arthritis   . CAD (coronary artery disease)   . Diabetes mellitus without complication ([place])   . Erosive esophagitis   . GERD (gastroesophageal reflux disease)   . Gouty arthropathy   . History of colonic polyps   . History of kidney stones   . History of MRSA infection   . Hyperkalemia   . Hyperlipidemia   . Hypertension   . Melanoma ([place])   . Myocardial infarction ([place]) 1986  . Peripheral vascular disease ([place])   . 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 Trigger finger of left hand   . Ureteral tumor     HOSPITAL COURSE:   1.  Acute on chronic systolic congestive heart failure.  Patient was diuresed with IV Lasix.  The patient will need to be on Lasix 40 mg twice daily standing dose.  The patient has failed his trial of as needed Lasix.  No Aldactone secondary to chronic kidney disease.  No ACE inhibitor at this point with the chronic kidney disease.  Patient on metoprolol and hydralazine.  Patient's respiratory status has improved.  Still has a little swelling of the lower extremity. 2.  Acute respiratory distress.  This has improved with diuresis.  Patient breathing comfortably on room air.  I like the patient flat and he was breathing comfortably. 3.  Elevated troponin this is demand ischemia with acute respiratory distress and  CHF. 4.  History of CAD on aspirin and Plavix and metoprolol 5.  Chronic kidney disease stage IV.  Creatinine will probably stabilize in the threes. Creatinine upon discharge 3.2.  Watch closely with diuresis. 6.  Iron deficiency anemia.  Patient was given IV iron prior to disposition.  On oral iron.  Must watch blood counts closely as outpatient since the patient is on Plavix and aspirin. 7.  BPH I started Flomax and his urination stream has been better even with just 1 pill. 8.  Essential hypertension on Toprol, hydralazine and Lasix. 9.  Type 2 diabetes mellitus.  I advised the patient that Glucophage is contraindicated with his creatinine being this high.  I advised that glipizide is not a good medication because it can cause prolonged hypoglycemia with chronic kidney disease.  Here in the hospital all of his sugars were ranging between 121 and 181.  I will have him follow-up as outpatient with Dr. Caryn Section.  Can consider low-dose Tradjenta as outpatient.  DISCHARGE CONDITIONS:   Satisfactory  CONSULTS OBTAINED:  Treatment Team:  Yolonda Kida, MD  DRUG ALLERGIES:   Allergies  Allergen Reactions  . Ciprofloxacin Itching and Swelling    Facial swelling     DISCHARGE MEDICATIONS:   Allergies as of 07/18/2017      Reactions   Ciprofloxacin Itching, Swelling   Facial swelling       Medication List  STOP taking these medications   amLODipine 5 MG tablet Commonly known as:  NORVASC   HYDROcodone-acetaminophen 5-325 MG tablet Commonly known as:  NORCO/VICODIN   promethazine 25 MG tablet Commonly known as:  PHENERGAN     TAKE these medications   acetaminophen 500 MG tablet Commonly known as:  TYLENOL Take 500 mg by mouth every 8 (eight) hours as needed for mild pain or headache.   aspirin EC 81 MG tablet Take 81 mg by mouth daily.   atorvastatin 80 MG tablet Commonly known as:  LIPITOR TAKE ONE TABLET BY MOUTH EVERY DAY   b complex vitamins capsule Take 1  capsule by mouth daily.   buPROPion 150 MG 12 hr tablet Commonly known as:  WELLBUTRIN SR Take 1 tablet (150 mg total) by mouth 2 (two) times daily. What changed:    how much to take  how to take this  when to take this  additional instructions   clopidogrel 75 MG tablet Commonly known as:  PLAVIX Take 1 tablet (75 mg total) by mouth daily.   furosemide 40 MG tablet Commonly known as:  LASIX Take 1 tablet (40 mg total) by mouth 2 (two) times daily. What changed:    when to take this  reasons to take this   hydrALAZINE 25 MG tablet Commonly known as:  APRESOLINE Take 1 tablet (25 mg total) by mouth every 8 (eight) hours.   Iron 325 (65 Fe) MG Tabs Take 325 mg by mouth 2 (two) times daily.   isosorbide mononitrate 60 MG 24 hr tablet Commonly known as:  IMDUR Take 1 tablet (60 mg total) by mouth daily.   metoprolol succinate 50 MG 24 hr tablet Commonly known as:  TOPROL-XL Take 1 tablet (50 mg total) by mouth daily. Take with or immediately following a meal.   pantoprazole 40 MG tablet Commonly known as:  PROTONIX Take 1 tablet (40 mg total) by mouth daily. What changed:  when to take this   tamsulosin 0.4 MG Caps capsule Commonly known as:  FLOMAX Take 1 capsule (0.4 mg total) by mouth daily after supper.   vitamin B-12 1000 MCG tablet Commonly known as:  CYANOCOBALAMIN Take 5,000 mcg by mouth daily.        DISCHARGE INSTRUCTIONS:   Follow-up PMD 6 days Follow-up CHF clinic Follow-up nephrology and cardiology as outpatient  If you experience worsening of your admission symptoms, develop shortness of breath, life threatening emergency, suicidal or homicidal thoughts you must seek medical attention immediately by calling 911 or calling your MD immediately  if symptoms less severe.  You Must read complete instructions/literature along with all the possible adverse reactions/side effects for all the Medicines you take and that have been prescribed to you.  Take any new Medicines after you have completely understood and accept all the possible adverse reactions/side effects.   Please note  You were cared for by a hospitalist during your hospital stay. If you have any questions about your discharge medications or the care you received while you were in the hospital after you are discharged, you can call the unit and asked to speak with the hospitalist on call if the hospitalist that took care of you is not available. Once you are discharged, your primary care physician will handle any further medical issues. Please note that NO REFILLS for any discharge medications will be authorized once you are discharged, as it is imperative that you return to your primary care physician (or establish a relationship  with a primary care physician if you do not have one) for your aftercare needs so that they can reassess your need for medications and monitor your lab values.    Today   CHIEF COMPLAINT:   Chief Complaint  Patient presents with  . Chest Pain    HISTORY OF PRESENT ILLNESS:  Thomas [name]  is a 72 y.o. male presented with chest pain and shortness of breath   VITAL SIGNS:  Blood pressure 118/71, pulse 64, temperature 98.2 F (36.8 C), resp. rate 18, height 5\' 6"  (1.676 m), weight 70.5 kg (155 lb 6.4 oz), SpO2 95 %.    PHYSICAL EXAMINATION:  GENERAL:  72 y.o.-year-old patient lying in the bed with no acute distress.  EYES: Pupils equal, round, reactive to light and accommodation. No scleral icterus. Extraocular muscles intact.  HEENT: Head atraumatic, normocephalic. Oropharynx and nasopharynx clear.  NECK:  Supple, no jugular venous distention. No thyroid enlargement, no tenderness.  LUNGS: Normal breath sounds bilaterally, no wheezing, rales,rhonchi or crepitation. No use of accessory muscles of respiration.  CARDIOVASCULAR: S1, S2 normal. No murmurs, rubs, or gallops.  ABDOMEN: Soft, non-tender, non-distended. Bowel sounds present. No  organomegaly or mass.  EXTREMITIES: Trace pedal edema, no cyanosis, or clubbing.  NEUROLOGIC: Cranial nerves II through XII are intact. Muscle strength 5/5 in all extremities. Sensation intact. Gait not checked.  PSYCHIATRIC: The patient is alert and oriented x 3.  SKIN: No obvious rash, lesion, or ulcer.   DATA REVIEW:   CBC Recent Labs  Lab 07/17/17 0451 07/18/17 1011  WBC 8.8  --   HGB 8.2* 8.6*  HCT 24.3*  --   PLT 468*  --     Chemistries  Recent Labs  Lab 07/18/17 1011  NA 135  K 5.0  CL 106  CO2 21*  GLUCOSE 256*  BUN 51*  CREATININE 3.20*  CALCIUM 8.6*    Cardiac Enzymes Recent Labs  Lab 07/17/17 0451  TROPONINI 0.03*      RADIOLOGY:  Dg Chest 2 View  Result Date: 07/16/2017 CLINICAL DATA:  Chest pain.  Shortness of breath. EXAM: CHEST - 2 VIEW COMPARISON:  Radiograph of July 10, 2017. FINDINGS: Stable cardiomegaly. Status post coronary artery bypass graft. No pneumothorax or pleural effusion is noted. Mild interstitial densities are noted in both lung bases concerning for pulmonary edema. The visualized skeletal structures are unremarkable. IMPRESSION: Possible minimal bilateral basilar edema. Electronically Signed   By: Marijo Conception, M.D.   On: 07/16/2017 21:47    Management plans discussed with the patient, family and they are in agreement.  CODE STATUS:     Code Status Orders  (From admission, onward)        Start     Ordered   07/17/17 0100  Full code  Continuous     07/17/17 0059    Code Status History    Date Active Date Inactive Code Status Order ID Comments User Context   06/10/2017 1610 06/14/2017 1308 Full Code [id number]  Henreitta Leber, MD Inpatient   05/25/2017 0024 05/30/2017 1325 Full Code [id number]  Lance Coon, MD Inpatient   11/14/2016 0938 11/14/2016 1442 Full Code [id number]  Algernon Huxley, MD Inpatient   10/31/2016 1121 10/31/2016 1817 Full Code [id number]  Algernon Huxley, MD Inpatient   10/18/2016 1357 10/18/2016 1850 Full Code  [id number]  Isaias Cowman, MD Inpatient      TOTAL TIME TAKING CARE OF THIS PATIENT: 35 minutes.    Majesta Leich[employer] M.D  on 07/18/2017 at 4:05 PM  Between 7am to 6pm - Pager - [phone]  After 6pm go to [email or web address] - password [email or web address]  [employer] Office  [phone]  CC: Primary care physician; Birdie Sons, MD

## 2017-07-18 NOTE — Progress Notes (Signed)
Patient may resume cardiac rehabilitation following discharge.

## 2017-07-18 NOTE — Progress Notes (Signed)
72 year old male patient with dx of acute on chronic systolic congestive heart failure, acute respiratory distress, elevated troponin with demand ischemia.  PMH significant for  CAD with known ischemic CM - last EF 35-40% - PCI and stent in February 2019 with DES to LAD, CABG 1996, HTN, chronic kidney disease stage IV, HLD, PVD, DM, GERD,  iron deficiency anemia, and BPH.  CHF Education / Re-education:   Rounded on patient.  Patient sitting up on side of bed watching TV.  Patient for discharge today.   ?? *Reviewed importance of and reason behind checking weight daily in the AM, after using the bathroom, but before getting dressed.?Patient reports that he has scales, has been weighing himself every day, and keeps a record of his daily weights. Praise provided for compliance.  Encouraged patient to?continue weighing himself again and explained the importance of doing so along with assessing his symptoms.?? ? Reviewed the following information with patient:  *Discussed when to call the Dr= weight gain of >2-3lb overnight of 5lb in a week,  *Discussed yellow zone= call MD: weight gain of >2-3lb overnight of 5lb in a week, increased swelling, increased SOB when lying down, chest discomfort, dizziness, increased fatigue *Red Zone= call 911: struggle to breath, fainting or near fainting, significant chest pain   *Reviewed low sodium diet-provided handout of recommended and not recommended foods.  Reviewed reading labels with patient. Discussed fluid intake with patient as well. Patient not currently on a fluid restriction, but advised no more than 8-8 ounces glass of fluids per day.?Patient has been following low sodium diet of less than 2000 mg.  Patient reported that his wife has even been surprised by his compliance with not eating salt/sodium.    *Instructed patient to take medications as prescribed for heart failure. Explained briefly why pt is on the medications (either make you feel better, live  longer or keep you out of the hospital) and discussed monitoring and side effects. Patient has been very compliant with his medications.    *Smoking Cessation - Patient is a former smoker.?Patient quit smoking about a month ago.   ? *Discussed the benefits of exercise. Patient has completed the orientation for Cardiac Rehab and will start classes in the near future.  Patient wants to come to Cardiac Rehab, as he feels he needs what this program has to offer.  Checked with Dr. Saralyn Pilar about patient resuming Cardiac Rehab as well as Dr. Leslye Peer. Both agreed with patient resuming Cardiac Rehab.  Dr. Saralyn Pilar has entered a note in [facility] for patient to resume Cardiac Rehab.    *[facility]- Explained the role of the [facility] and that Dr. Clayborn Bigness has recommended he be followed in the [facility] in addition to seeing Dr. Saralyn Pilar.  Explained to patient that the HF Clinic does not replace his PCP/cardiologist, but is an additional resource to help him manage his HF and to keep him out of the hospital. ?Patient encouraged to keep this appointment. Appointment in the HF Clinic is scheduled for for August 09, 2017 at 10:20 a.m.    The reason for the appointment being April 24 is due to patient's other follow-up appointments which include,  PCP appointment 07/24/2017 at 10 a.m. Dr. Saralyn Pilar 07/25/2017 at 3:30 p.m. Dr. Candiss Norse 07/31/2017 at 9:30 a.m. Then,  [facility] 08/09/2017 at 10:20 a.m.    Again,  the 5 Steps to Living Better with Heart Failure were reviewed.   ? Roanna Epley,  RN, BSN, [facility] Cardiovascular and Pulmonary Nurse Navigator   ?? ?

## 2017-07-18 NOTE — Care Management Important Message (Signed)
Important Message  Patient Details  Name: Thomas [name] MRN: [id number] Date of Birth: 30-Apr-1945   Medicare Important Message Given:  Yes Signed IM notice given    Katrina Stack, RN 07/18/2017, 11:13 AM

## 2017-07-18 NOTE — Progress Notes (Signed)
Thomas [name] to be D/C'd Home per MD order.  Discussed prescriptions and follow up appointments with the patient. Prescriptions given to patient, medication list explained in detail. Pt and daughter verbalized understanding.  Allergies as of 07/18/2017      Reactions   Ciprofloxacin Itching, Swelling   Facial swelling       Medication List    STOP taking these medications   amLODipine 5 MG tablet Commonly known as:  NORVASC   HYDROcodone-acetaminophen 5-325 MG tablet Commonly known as:  NORCO/VICODIN   promethazine 25 MG tablet Commonly known as:  PHENERGAN     TAKE these medications   acetaminophen 500 MG tablet Commonly known as:  TYLENOL Take 500 mg by mouth every 8 (eight) hours as needed for mild pain or headache.   aspirin EC 81 MG tablet Take 81 mg by mouth daily.   atorvastatin 80 MG tablet Commonly known as:  LIPITOR TAKE ONE TABLET BY MOUTH EVERY DAY   b complex vitamins capsule Take 1 capsule by mouth daily.   buPROPion 150 MG 12 hr tablet Commonly known as:  WELLBUTRIN SR Take 1 tablet (150 mg total) by mouth 2 (two) times daily. What changed:    how much to take  how to take this  when to take this  additional instructions   clopidogrel 75 MG tablet Commonly known as:  PLAVIX Take 1 tablet (75 mg total) by mouth daily.   furosemide 40 MG tablet Commonly known as:  LASIX Take 1 tablet (40 mg total) by mouth 2 (two) times daily. What changed:    when to take this  reasons to take this   hydrALAZINE 25 MG tablet Commonly known as:  APRESOLINE Take 1 tablet (25 mg total) by mouth every 8 (eight) hours.   Iron 325 (65 Fe) MG Tabs Take 325 mg by mouth 2 (two) times daily.   isosorbide mononitrate 60 MG 24 hr tablet Commonly known as:  IMDUR Take 1 tablet (60 mg total) by mouth daily.   metoprolol succinate 50 MG 24 hr tablet Commonly known as:  TOPROL-XL Take 1 tablet (50 mg total) by mouth daily. Take with or immediately following a  meal.   pantoprazole 40 MG tablet Commonly known as:  PROTONIX Take 1 tablet (40 mg total) by mouth daily. What changed:  when to take this   tamsulosin 0.4 MG Caps capsule Commonly known as:  FLOMAX Take 1 capsule (0.4 mg total) by mouth daily after supper.   vitamin B-12 1000 MCG tablet Commonly known as:  CYANOCOBALAMIN Take 5,000 mcg by mouth daily.       Vitals:   07/18/17 0858 07/18/17 1458  BP: 127/75 118/71  Pulse: 74 64  Resp: 18 18  Temp: 97.8 F (36.6 C) 98.2 F (36.8 C)  SpO2: 96% 95%    Tele box removed and returned.Skin clean, dry and intact without evidence of skin break down, no evidence of skin tears noted. IV catheter discontinued intact. Site without signs and symptoms of complications. Dressing and pressure applied. Pt denies pain at this time. No complaints noted.  An After Visit Summary was printed and given to the patient. Patient escorted via [place], and D/C home via private auto.  Thomas [name]

## 2017-07-18 NOTE — Progress Notes (Signed)
[facility]  ROUNDING NOTE   Subjective:   UOP 1450  Family at bedside.   Objective:  Vital signs in last 24 hours:  Temp:  [97.4 F (36.3 C)-97.9 F (36.6 C)] 97.8 F (36.6 C) (04/02 0858) Pulse Rate:  [63-78] 74 (04/02 0858) Resp:  [17-18] 18 (04/02 0858) BP: (122-142)/(69-80) 127/75 (04/02 0858) SpO2:  [96 %-99 %] 96 % (04/02 0858) Weight:  [70.5 kg (155 lb 6.4 oz)] 70.5 kg (155 lb 6.4 oz) (04/02 0431)  Weight change: 1.542 kg (3 lb 6.4 oz) Filed Weights   07/17/17 0104 07/17/17 1957 07/18/17 0431  Weight: 71.4 kg (157 lb 6.4 oz) 70.5 kg (155 lb 6.4 oz) 70.5 kg (155 lb 6.4 oz)    Intake/Output: I/O last 3 completed shifts: In: 120 [P.O.:120] Out: 2351 [Urine:2350; Stool:1]   Intake/Output this shift:  Total I/O In: 360 [P.O.:360] Out: -   Physical Exam: General: NAD,   Head: Normocephalic, atraumatic. Moist oral mucosal membranes  Eyes: Anicteric, PERRL  Neck: Supple, trachea midline  Lungs:  Bibasilar crackles  Heart: Regular rate and rhythm  Abdomen:  Soft, nontender,   Extremities: + peripheral edema.  Neurologic: Nonfocal, moving all four extremities  Skin: No lesions       Basic Metabolic Panel: Recent Labs  Lab 07/16/17 2102 07/17/17 0451 07/18/17 1011  NA 133* 135 135  K 5.4* 5.0 5.0  CL 108 108 106  CO2 18* 19* 21*  GLUCOSE 232* 158* 256*  BUN 51* 51* 51*  CREATININE 2.97* 2.99* 3.20*  CALCIUM 8.4* 8.3* 8.6*    Liver Function Tests: No results for input(s): AST, ALT, ALKPHOS, BILITOT, PROT, ALBUMIN in the last 168 hours. No results for input(s): LIPASE, AMYLASE in the last 168 hours. No results for input(s): AMMONIA in the last 168 hours.  CBC: Recent Labs  Lab 07/16/17 2102 07/17/17 0451 07/18/17 1011  WBC 11.5* 8.8  --   HGB 8.6* 8.2* 8.6*  HCT 27.7* 24.3*  --   MCV 88.3 87.0  --   PLT 538* 468*  --     Cardiac Enzymes: Recent Labs  Lab 07/16/17 2102 07/17/17 0451  TROPONINI 0.04* 0.03*     BNP: Invalid input(s): POCBNP  CBG: Recent Labs  Lab 07/17/17 0809 07/18/17 0732  GLUCAP 121* 171*    Microbiology: Results for orders placed or performed in visit on 07/03/17  Urine Culture     Status: None   Collection Time: 07/03/17 12:14 PM  Result Value Ref Range Status   Urine Culture, Routine Final report  Final   Organism ID, Bacteria Comment  Final    Comment: Mixed urogenital flora Less than 10,000 colonies/mL     Coagulation Studies: No results for input(s): LABPROT, INR in the last 72 hours.  Urinalysis: No results for input(s): COLORURINE, LABSPEC, PHURINE, GLUCOSEU, HGBUR, BILIRUBINUR, KETONESUR, PROTEINUR, UROBILINOGEN, NITRITE, LEUKOCYTESUR in the last 72 hours.  Invalid input(s): APPERANCEUR    Imaging: Dg Chest 2 View  Result Date: 07/16/2017 CLINICAL DATA:  Chest pain.  Shortness of breath. EXAM: CHEST - 2 VIEW COMPARISON:  Radiograph of July 10, 2017. FINDINGS: Stable cardiomegaly. Status post coronary artery bypass graft. No pneumothorax or pleural effusion is noted. Mild interstitial densities are noted in both lung bases concerning for pulmonary edema. The visualized skeletal structures are unremarkable. IMPRESSION: Possible minimal bilateral basilar edema. Electronically Signed   By: Marijo Conception, M.D.   On: 07/16/2017 21:47     Medications:    .  aspirin EC  81 mg Oral Daily  . atorvastatin  80 mg Oral Daily  . B-complex with vitamin C  1 tablet Oral Daily  . buPROPion  150 mg Oral BID  . clopidogrel  75 mg Oral Daily  . docusate sodium  100 mg Oral BID  . ferrous sulfate  325 mg Oral BID  . furosemide  40 mg Oral BID  . heparin  5,000 Units Subcutaneous Q8H  . hydrALAZINE  25 mg Oral Q8H  . isosorbide mononitrate  60 mg Oral Daily  . metoprolol succinate  50 mg Oral Daily  . pantoprazole  40 mg Oral Daily  . tamsulosin  0.4 mg Oral QPC supper  . vitamin B-12  5,000 mcg Oral Daily   acetaminophen **OR** acetaminophen,  bisacodyl, HYDROcodone-acetaminophen, ondansetron **OR** ondansetron (ZOFRAN) IV, traZODone  Assessment/ Plan:  [name] is a 72 y.o. white male with diabetes mellitus type II, hypertension, coronary artery disease, peripheral arterial disease, BPH, hyperlipidemia, congestive heart failure.  1. 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 Baseline creatinine of 2.8, GFR of 20.  Chronic kidney disease is thought to be secondary to diabetes however glomeronephritis is a possibility.  Acute renal failure from acute exacerbation of congestive heart failure  2. Hypertension and acute exacerbation of systolic congestive heart failure. Echo from 2/7 with EF of 35-40%.  - furosemide - change to PO  - Continue tamsulosin, metoprolol, imdur, hydralazine,   3. Anemia of chronic kidney disease:  - Discussed EPO with patient.    LOS: 1 Thomas [name] 4/2/20192:02 PM

## 2017-07-18 NOTE — Progress Notes (Signed)
Inpatient Diabetes Program Recommendations  AACE/ADA: New Consensus Statement on Inpatient Glycemic Control (2015)  Target Ranges:  Prepandial:   less than 140 mg/dL      Peak postprandial:   less than 180 mg/dL (1-2 hours)      Critically ill patients:  140 - 180 mg/dL   Results for Thomas [name], Thomas [name] (MRN [id number]) as of 07/18/2017 10:49  Ref. Range 07/18/2017 07:32  Glucose-Capillary Latest Ref Range: 65 - 99 mg/dL 171 (H)   Results for Thomas [name], Thomas [name] (MRN [id number]) as of 07/18/2017 10:49  Ref. Range 06/30/2017 08:16  Hemoglobin A1C Latest Ref Range: 4.8 - 5.6 % 7.6 (H)     Diabetes history: DM2  Outpatient Diabetes medications: None  Current orders for Inpatient glycemic control: None     MD- Please consider placing orders for Novolog Sensitive Correction Scale/ SSI (0-9 units) TID AC + HS   Outpatient DM control:  In reviewing chart, noted patient was taking Glipizide XL 10 mg daily as an outpatient. Patient was inpatient from 06/10/17 to 06/14/17 and Glipizide XL was discontinued (per discharge summary on 06/14/17). A1C 7.6% on 06/30/17. Patient likely needs oral DM medication prescribed at time of discharge for DM control.       --Will follow patient during hospitalization--  Wyn Quaker RN, MSN, CDE Diabetes Coordinator Inpatient Glycemic Control Team Team Pager: [phone] (8a-5p)

## 2017-07-19 ENCOUNTER — Telehealth: Payer: Self-pay

## 2017-07-19 NOTE — Telephone Encounter (Signed)
Transition Care Management Follow-Up Telephone Call   Date discharged and where: [facility] on 07/18/17  How have you been since you were released from the hospital? Doing much better since starting back on lasix. Denies chest pain, SOB, fever, n/v/d.  Any patient concerns? None.  Items Reviewed:   Meds: verified- unable to mark as reviewed due to [facility] being in pts chart.  Allergies: verified  Dietary Changes Reviewed: heart healthy   Functional Questionnaire:  Independent-I Dependent-D  ADLs:   Dressing- I    Eating- I   Maintaining continence- I   Transferring- I   Transportation- I   Meal Prep- I   Managing Meds- I   Confirmed importance and Date/Time of follow-up visits scheduled: 07/24/17 @ 10:00 AM   Confirmed with patient if condition worsens to call PCP or go to the Emergency Dept. Patient was given office number and encouraged to call back with questions or concerns: YES

## 2017-07-20 ENCOUNTER — Encounter: Payer: Self-pay | Admitting: *Deleted

## 2017-07-20 ENCOUNTER — Telehealth: Payer: Self-pay | Admitting: *Deleted

## 2017-07-20 DIAGNOSIS — Z955 Presence of coronary angioplasty implant and graft: Secondary | ICD-10-CM

## 2017-07-20 DIAGNOSIS — I214 Non-ST elevation (NSTEMI) myocardial infarction: Secondary | ICD-10-CM

## 2017-07-20 NOTE — Telephone Encounter (Signed)
Thomas [name] was discharged yesterday.  After discussion with doctor, patient, [place] and RN he is going to start on Monday next week.  Diane's Note: From Diane:  Dr. Clayborn Bigness rounded on patient while here in hospital.  Dr. Saralyn Pilar is patient's primary cardiologist.  I spoke with Dr. Saralyn Pilar and there is a note in [facility] that patient may return to Cardiac Rehab.  Patient wants to participate, as he says he needs this.  He will probably return next week, as he is being discharged today.  I am so proud of this patient, as he quit smoking a month ago, has been weighing himself every day, and has been limiting his sodium intake.

## 2017-07-24 ENCOUNTER — Encounter: Payer: Medicare Other | Admitting: *Deleted

## 2017-07-24 ENCOUNTER — Encounter: Payer: Self-pay | Admitting: Family Medicine

## 2017-07-24 ENCOUNTER — Ambulatory Visit (INDEPENDENT_AMBULATORY_CARE_PROVIDER_SITE_OTHER): Payer: Medicare Other | Admitting: Family Medicine

## 2017-07-24 VITALS — BP 124/64 | HR 87 | Temp 97.9°F | Resp 16 | Ht 66.0 in | Wt 154.0 lb

## 2017-07-24 DIAGNOSIS — I5023 Acute on chronic systolic (congestive) heart failure: Secondary | ICD-10-CM | POA: Diagnosis not present

## 2017-07-24 DIAGNOSIS — E1122 Type 2 diabetes mellitus with diabetic chronic kidney disease: Secondary | ICD-10-CM | POA: Diagnosis not present

## 2017-07-24 DIAGNOSIS — N184 Chronic kidney disease, stage 4 (severe): Secondary | ICD-10-CM

## 2017-07-24 DIAGNOSIS — D500 Iron deficiency anemia secondary to blood loss (chronic): Secondary | ICD-10-CM | POA: Diagnosis not present

## 2017-07-24 DIAGNOSIS — N183 Chronic kidney disease, stage 3 (moderate): Secondary | ICD-10-CM

## 2017-07-24 DIAGNOSIS — I214 Non-ST elevation (NSTEMI) myocardial infarction: Secondary | ICD-10-CM | POA: Diagnosis not present

## 2017-07-24 DIAGNOSIS — R195 Other fecal abnormalities: Secondary | ICD-10-CM

## 2017-07-24 DIAGNOSIS — Z955 Presence of coronary angioplasty implant and graft: Secondary | ICD-10-CM

## 2017-07-24 DIAGNOSIS — Z48812 Encounter for surgical aftercare following surgery on the circulatory system: Secondary | ICD-10-CM | POA: Diagnosis not present

## 2017-07-24 LAB — GLUCOSE, CAPILLARY
GLUCOSE-CAPILLARY: 319 mg/dL — AB (ref 65–99)
GLUCOSE-CAPILLARY: 272 mg/dL — AB (ref 65–99)
Glucose-Capillary: 215 mg/dL — ABNORMAL HIGH (ref 65–99)

## 2017-07-24 MED ORDER — GLIPIZIDE ER 2.5 MG PO TB24: 2.5000 mg | ORAL_TABLET | Freq: Every day | ORAL | 1 refills | Status: DC

## 2017-07-24 NOTE — Progress Notes (Signed)
Daily Session Note  Patient Details  Name: Thomas [name] MRN: [id number] Date of Birth: Aug 22, 1945 Referring Provider:     Cardiac Rehab from 06/29/2017 in [facility]  Referring Provider  [place]      Encounter Date: 07/24/2017  Check In: Session Check In - 07/24/17 0752      Check-In   Location  [facility]-Cardiac & Pulmonary Rehab    Staff Present  Heath Lark, RN, BSN, CCRP;Dowell Hoon Luan Pulling, MA, RCEP, CCRP, Exercise Physiologist;Kelly Amedeo Plenty, BS, ACSM CEP, Exercise Physiologist    Supervising physician immediately available to respond to emergencies  See telemetry face sheet for immediately available ER MD    Medication changes reported      No    Fall or balance concerns reported     No    Warm-up and Cool-down  Performed on first and last piece of equipment    Resistance Training Performed  Yes    VAD Patient?  No      Pain Assessment   Currently in Pain?  No/denies          Social History   Tobacco Use  Smoking Status Former Smoker  . Packs/day: 1.00  . Years: 51.00  . Pack years: 51.00  . Types: Cigarettes  . Last attempt to quit: 06/21/2017  . Years since quitting: 0.0  Smokeless Tobacco Never Used  Tobacco Comment   started smoking at age [age]. Smoked for 50 years    Goals Met:  Exercise tolerated well Personal goals reviewed No report of cardiac concerns or symptoms Strength training completed today  Goals Unmet:  Not Applicable  Comments: First full day of exercise!  Patient was oriented to gym and equipment including functions, settings, policies, and procedures.  Patient's individual exercise prescription and treatment plan were reviewed.  All starting workloads were established based on the results of the 6 minute walk test done at initial orientation visit.  The plan for exercise progression was also introduced and progression will be customized based on patient's performance and goals. Blood sugar at check in was 309.  Given  water during education, recheck 276.   Dr. Emily Filbert is Medical Director for [facility] and [facility].

## 2017-07-24 NOTE — Progress Notes (Signed)
Patient: Thomas [name] Male    DOB: [birth year]   72 y.o.   MRN: [id number] Visit Date: 07/24/2017  Today's Provider: Lelon Huh, MD   Chief Complaint  Patient presents with  . Hospitalization Follow-up   Subjective:    HPI   Follow up Hospitalization  Patient was admitted to [facility] on 07/16/2017 and discharged on 07/18/2017. He was treated for SOB, Chest Pressure, anemia, Acute Pulmonary Edema, Heart Failure, acute on chronic kidney disease. Treatment for this included; diuresed with IV Lasix. Patient placed on lasix 40 mg bid standing dose. Patient given IV iron and placed on iron due to iron deficiency and anemia. Will need to watch blood counts closely as outpatient.   He reports that hydralazine is making very fatigued and he wants to get off of it. He has follow up with cardiology tomorrow. He does report his breathing has greatly improved.   On 3/15 he had CBC showing hbg of 7.0 and ferritin of 17. He was started on oral iron and given iron infusion in the hospital. Hgb the day before  discharge was 8.2  He did see [facility] on 07/11/2017, he had positive hemoccult test and negative h. Pylori stool ag. He has follow up scheduled 4/23.   Creatinine at discharge was 3.20 and he reports he has follow up with Dr. Merita [place] on 07/31/2017.  He was previously on metformin which was discontinued when he developed worsening renal failure. He as also on glipizide prior to hospitalization but was discontinued due to potential for hypoglycemia in setting or renal failure. However he reports his blood sugar has been much high since this was discontinued, consistently in the upper 200s and sometimes in the 300s with no hypoglyemia.   ----------------------------------------------------------------     Allergies  Allergen Reactions  . Ciprofloxacin Itching and Swelling    Facial swelling      Current Outpatient Medications:  .  acetaminophen (TYLENOL) 500 MG tablet, Take 500 mg by  mouth every 8 (eight) hours as needed for mild pain or headache., Disp: , Rfl:  .  aspirin EC 81 MG tablet, Take 81 mg by mouth daily., Disp: , Rfl:  .  atorvastatin (LIPITOR) 80 MG tablet, TAKE ONE TABLET BY MOUTH EVERY DAY, Disp: 30 tablet, Rfl: 5 .  b complex vitamins capsule, Take 1 capsule by mouth daily., Disp: , Rfl:  .  buPROPion (WELLBUTRIN SR) 150 MG 12 hr tablet, Take 1 tablet (150 mg total) by mouth 2 (two) times daily., Disp: , Rfl:  .  clopidogrel (PLAVIX) 75 MG tablet, Take 1 tablet (75 mg total) by mouth daily., Disp: 30 tablet, Rfl: 1 .  Ferrous Sulfate (IRON) 325 (65 Fe) MG TABS, Take 325 mg by mouth 2 (two) times daily., Disp: , Rfl:  .  furosemide (LASIX) 40 MG tablet, Take 1 tablet (40 mg total) by mouth 2 (two) times daily., Disp: 60 tablet, Rfl: 0 .  hydrALAZINE (APRESOLINE) 25 MG tablet, Take 1 tablet (25 mg total) by mouth every 8 (eight) hours., Disp: 90 tablet, Rfl: 0 .  isosorbide mononitrate (IMDUR) 60 MG 24 hr tablet, Take 1 tablet (60 mg total) by mouth daily., Disp: 30 tablet, Rfl: 5 .  metoprolol succinate (TOPROL-XL) 50 MG 24 hr tablet, Take 1 tablet (50 mg total) by mouth daily. Take with or immediately following a meal., Disp: 30 tablet, Rfl: 5 .  pantoprazole (PROTONIX) 40 MG tablet, Take 1 tablet (40 mg total) by  mouth daily. (Patient taking differently: Take 40 mg by mouth 2 (two) times daily. ), Disp: 30 tablet, Rfl: 12 .  tamsulosin (FLOMAX) 0.4 MG CAPS capsule, Take 1 capsule (0.4 mg total) by mouth daily after supper., Disp: 30 capsule, Rfl: 0 .  vitamin B-12 (CYANOCOBALAMIN) 1000 MCG tablet, Take 5,000 mcg by mouth daily., Disp: , Rfl:   Review of Systems  Constitutional: Negative for appetite change, chills and fever.  Respiratory: Negative for chest tightness, shortness of breath and wheezing.   Cardiovascular: Negative for chest pain and palpitations.  Gastrointestinal: Negative for abdominal pain, nausea and vomiting.    Social History   Tobacco  Use  . Smoking status: Former Smoker    Packs/day: 1.00    Years: 51.00    Pack years: 51.00    Types: Cigarettes    Last attempt to quit: 06/21/2017    Years since quitting: 0.0  . Smokeless tobacco: Never Used  . Tobacco comment: started smoking at age [age]. Smoked for 50 years  Substance Use Topics  . Alcohol use: No    Alcohol/week: 0.0 oz    Frequency: Never   Objective:   BP 124/64 (BP Location: Right Arm, Patient Position: Sitting, Cuff Size: Normal)   Pulse 87   Temp 97.9 F (36.6 C) (Oral)   Resp 16   Ht 5\' 6"  (1.676 m)   Wt 154 lb (69.9 kg)   SpO2 96%   BMI 24.86 kg/m  Vitals:   07/24/17 0959  BP: 124/64  Pulse: 87  Resp: 16  Temp: 97.9 F (36.6 C)  TempSrc: Oral  SpO2: 96%  Weight: 154 lb (69.9 kg)  Height: 5\' 6"  (1.676 m)     Physical Exam   General Appearance:    Alert, cooperative, no distress  Eyes:    PERRL, conjunctiva/corneas clear, EOM's intact       Lungs:     Clear to auscultation bilaterally, respirations unlabored  Heart:    Regular rate and rhythm  Neurologic:   Awake, alert, oriented x 3. No apparent focal neurological           defect.           Assessment & Plan:     1. Acute on chronic systolic CHF (congestive heart failure), NYHA class 2 (HCC) Recent exacerbation likely multifactorial, partially exacerbated by anemia as below. Greatly improved after diuresis during recent hospitalization but worsening renal function as below.  He very much wants to get off hydralazine but discussed benefits of this medication in setting of CHF. He will need to discuss further with cardiology.   2. Type 2 diabetes mellitus with stage 3 chronic kidney disease, without long-term current use of insulin ([place]) Had no hypoglycemia on glipizide. Will start back on 2.5mg  per day and can take a second 2.5mg  tablet per day if he sees blood sugar over 300.    3. CKD (chronic kidney disease), stage IV ([place]) Has follow up scheduled with Dr. Merita Norton next week.     4. Iron deficiency anemia due to chronic blood loss Continue oral iron supplementation. Expect CBC to be checked by Dr. Merita Norton next week.   5. Occult blood positive stool Follow up GI as scheduled.        Lelon Huh, MD  [facility]

## 2017-07-25 DIAGNOSIS — I25810 Atherosclerosis of coronary artery bypass graft(s) without angina pectoris: Secondary | ICD-10-CM | POA: Diagnosis not present

## 2017-07-25 DIAGNOSIS — I5023 Acute on chronic systolic (congestive) heart failure: Secondary | ICD-10-CM | POA: Diagnosis not present

## 2017-07-25 DIAGNOSIS — R0602 Shortness of breath: Secondary | ICD-10-CM | POA: Diagnosis not present

## 2017-07-25 DIAGNOSIS — I214 Non-ST elevation (NSTEMI) myocardial infarction: Secondary | ICD-10-CM | POA: Diagnosis not present

## 2017-07-25 DIAGNOSIS — I10 Essential (primary) hypertension: Secondary | ICD-10-CM | POA: Diagnosis not present

## 2017-07-26 ENCOUNTER — Ambulatory Visit: Payer: Self-pay | Admitting: Family

## 2017-07-27 ENCOUNTER — Telehealth: Payer: Self-pay | Admitting: *Deleted

## 2017-07-27 MED ORDER — HYDRALAZINE HCL 25 MG PO TABS: 25.0000 mg | ORAL_TABLET | Freq: Two times a day (BID) | ORAL | 5 refills | Status: DC

## 2017-07-27 NOTE — Telephone Encounter (Signed)
Prescription has been sent to [facility]

## 2017-07-27 NOTE — Telephone Encounter (Signed)
Patient is requesting a refill for hydralazine 25 mg. Patient was prescribed this in hospital. Patient states he is almost out and wanted to know if Dr. Caryn Section would refill it. Patient states he is taking medication bid not every 8 hours and would like the prescription to have these directions. Please advise?

## 2017-07-28 NOTE — Telephone Encounter (Signed)
Advised patient as below.  

## 2017-07-31 ENCOUNTER — Ambulatory Visit: Payer: Self-pay | Admitting: Family Medicine

## 2017-07-31 DIAGNOSIS — E1129 Type 2 diabetes mellitus with other diabetic kidney complication: Secondary | ICD-10-CM | POA: Diagnosis not present

## 2017-07-31 DIAGNOSIS — N184 Chronic kidney disease, stage 4 (severe): Secondary | ICD-10-CM | POA: Diagnosis not present

## 2017-07-31 DIAGNOSIS — R600 Localized edema: Secondary | ICD-10-CM | POA: Diagnosis not present

## 2017-07-31 DIAGNOSIS — E875 Hyperkalemia: Secondary | ICD-10-CM | POA: Diagnosis not present

## 2017-07-31 DIAGNOSIS — R808 Other proteinuria: Secondary | ICD-10-CM | POA: Diagnosis not present

## 2017-08-02 ENCOUNTER — Encounter: Payer: Self-pay | Admitting: *Deleted

## 2017-08-02 ENCOUNTER — Telehealth: Payer: Self-pay | Admitting: *Deleted

## 2017-08-02 DIAGNOSIS — Z955 Presence of coronary angioplasty implant and graft: Secondary | ICD-10-CM

## 2017-08-02 DIAGNOSIS — I214 Non-ST elevation (NSTEMI) myocardial infarction: Secondary | ICD-10-CM

## 2017-08-02 NOTE — Progress Notes (Signed)
Cardiac Individual Treatment Plan  Patient Details  Name: Thomas [name] MRN: [id number] Date of Birth: 10-20-1945 Referring Provider:     Cardiac Rehab from 06/29/2017 in [facility] Cardiac and Pulmonary Rehab  Referring Provider  Paraschos      Initial Encounter Date:    Cardiac Rehab from 06/29/2017 in [facility] Cardiac and Pulmonary Rehab  Date  06/29/17  Referring Provider  Paraschos      Visit Diagnosis: NSTEMI (non-ST elevated myocardial infarction) [facility])  Status post coronary artery stent placement  Patient's Home Medications on Admission:  Current Outpatient Medications:  .  acetaminophen (TYLENOL) 500 MG tablet, Take 500 mg by mouth every 8 (eight) hours as needed for mild pain or headache., Disp: , Rfl:  .  aspirin EC 81 MG tablet, Take 81 mg by mouth daily., Disp: , Rfl:  .  atorvastatin (LIPITOR) 80 MG tablet, TAKE ONE TABLET BY MOUTH EVERY DAY, Disp: 30 tablet, Rfl: 5 .  b complex vitamins capsule, Take 1 capsule by mouth daily., Disp: , Rfl:  .  buPROPion (WELLBUTRIN SR) 150 MG 12 hr tablet, Take 1 tablet (150 mg total) by mouth 2 (two) times daily., Disp: , Rfl:  .  clopidogrel (PLAVIX) 75 MG tablet, Take 1 tablet (75 mg total) by mouth daily., Disp: 30 tablet, Rfl: 1 .  Ferrous Sulfate (IRON) 325 (65 Fe) MG TABS, Take 325 mg by mouth 2 (two) times daily., Disp: , Rfl:  .  furosemide (LASIX) 40 MG tablet, Take 1 tablet (40 mg total) by mouth 2 (two) times daily., Disp: 60 tablet, Rfl: 0 .  glipiZIDE (GLUCOTROL XL) 2.5 MG 24 hr tablet, Take 1 tablet (2.5 mg total) by mouth daily with breakfast. Take a second tablet as needed if your p.m. Blood sugar is over 250, Disp: 45 tablet, Rfl: 1 .  hydrALAZINE (APRESOLINE) 25 MG tablet, Take 1 tablet (25 mg total) by mouth every 12 (twelve) hours., Disp: 60 tablet, Rfl: 5 .  isosorbide mononitrate (IMDUR) 60 MG 24 hr tablet, Take 1 tablet (60 mg total) by mouth daily., Disp: 30 tablet, Rfl: 5 .  metoprolol succinate (TOPROL-XL) 50 MG 24  hr tablet, Take 1 tablet (50 mg total) by mouth daily. Take with or immediately following a meal., Disp: 30 tablet, Rfl: 5 .  pantoprazole (PROTONIX) 40 MG tablet, Take 1 tablet (40 mg total) by mouth daily. (Patient taking differently: Take 40 mg by mouth 2 (two) times daily. ), Disp: 30 tablet, Rfl: 12 .  tamsulosin (FLOMAX) 0.4 MG CAPS capsule, Take 1 capsule (0.4 mg total) by mouth daily after supper., Disp: 30 capsule, Rfl: 0 .  vitamin B-12 (CYANOCOBALAMIN) 1000 MCG tablet, Take 5,000 mcg by mouth daily., Disp: , Rfl:   Past Medical History: Past Medical History:  Diagnosis Date  . Allergy   . Arthritis   . CAD (coronary artery disease)   . Diabetes mellitus without complication ([place])   . Erosive esophagitis   . GERD (gastroesophageal reflux disease)   . Gouty arthropathy   . History of colonic polyps   . History of kidney stones   . History of MRSA infection   . Hyperkalemia   . Hyperlipidemia   . Hypertension   . Melanoma ([place])   . Myocardial infarction ([place]) 1986  . Peripheral vascular disease ([place])   . 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 Trigger finger of left hand   . Ureteral tumor     Tobacco  Use: Social History   Tobacco Use  Smoking Status Former Smoker  . Packs/day: 1.00  . Years: 51.00  . Pack years: 51.00  . Types: Cigarettes  . Last attempt to quit: 06/21/2017  . Years since quitting: 0.1  Smokeless Tobacco Never Used  Tobacco Comment   started smoking at age [age]. Smoked for 50 years    Labs: Recent Review Flowsheet Data    Labs for ITP Cardiac and Pulmonary Rehab Latest Ref Rng & Units 06/03/2016 08/10/2016 09/30/2016 01/31/2017 06/30/2017   Cholestrol 100 - 199 mg/dL 404(H) 200(H) 212(H) - 140   LDLCALC 0 - 99 mg/dL Comment 101(H) 112(H) - 70   HDL >39 mg/dL 36(L) 36(L) 36(L) - 47   Trlycerides 0 - 149 mg/dL 642(HH) 315(H) 322(H) - 115   Hemoglobin A1c 4.8 - 5.6 % 8.0 - 7.9 7.6 7.6(H)       Exercise Target Goals:     Exercise Program Goal: Individual exercise prescription set using results from initial 6 min walk test and THRR while considering  patient's activity barriers and safety.   Exercise Prescription Goal: Initial exercise prescription builds to 30-45 minutes a day of aerobic activity, 2-3 days per week.  Home exercise guidelines will be given to patient during program as part of exercise prescription that the participant will acknowledge.  Activity Barriers & Risk Stratification: Activity Barriers & Cardiac Risk Stratification - 06/29/17 1347      Activity Barriers & Cardiac Risk Stratification   Activity Barriers  Shortness of Breath    Cardiac Risk Stratification  Moderate       6 Minute Walk: 6 Minute Walk    Row Name 06/29/17 1424         6 Minute Walk   Distance  1070 feet     Walk Time  6 minutes     # of Rest Breaks  0     MPH  2     METS  2.4     RPE  13     Perceived Dyspnea   1     VO2 Peak  8.4     Symptoms  Yes (comment)     Comments  leg fatigue/shins "burn" 5/10     Resting HR  63 bpm     Resting BP  110/60     Resting Oxygen Saturation   98 %     Exercise Oxygen Saturation  during 6 min walk  97 %     Max Ex. HR  98 bpm     Max Ex. BP  134/66     2 Minute Post BP  120/76        Oxygen Initial Assessment:   Oxygen Re-Evaluation:   Oxygen Discharge (Final Oxygen Re-Evaluation):   Initial Exercise Prescription: Initial Exercise Prescription - 06/29/17 1400      Date of Initial Exercise RX and Referring Provider   Date  06/29/17    Referring Provider  Paraschos      Treadmill   MPH  1.5    Grade  0    Minutes  15    METs  2.15      Recumbant Bike   Level  1    RPM  60    Watts  9    Minutes  15    METs  2      NuStep   Level  1    SPM  80    Minutes  15  METs  2      Track   Laps  25    Minutes  15    METs  2.15      Prescription Details   Frequency (times per week)  3    Duration  Progress to 45 minutes of aerobic  exercise without signs/symptoms of physical distress      Intensity   THRR 40-80% of Max Heartrate  96-130    Ratings of Perceived Exertion  11-13    Perceived Dyspnea  0-4      Resistance Training   Training Prescription  Yes    Weight  3 lb    Reps  10-15       Perform Capillary Blood Glucose checks as needed.  Exercise Prescription Changes: Exercise Prescription Changes    Row Name 06/29/17 1300 08/02/17 1200           Response to Exercise   Blood Pressure (Admit)  110/60  112/70      Blood Pressure (Exercise)  134/66  160/70      Blood Pressure (Exit)  120/76  110/60      Heart Rate (Admit)  61 bpm  94 bpm      Heart Rate (Exercise)  98 bpm  129 bpm      Heart Rate (Exit)  73 bpm  113 bpm      Oxygen Saturation (Admit)  98 %  -      Oxygen Saturation (Exit)  97 %  -      Rating of Perceived Exertion (Exercise)  13  13      Symptoms  -  none      Comments  -  first full day of exercise      Duration  -  Progress to 45 minutes of aerobic exercise without signs/symptoms of physical distress      Intensity  -  THRR unchanged        Progression   Progression  -  Continue to progress workloads to maintain intensity without signs/symptoms of physical distress.      Average METs  -  1.8        Resistance Training   Training Prescription  -  Yes      Weight  -  3 lbs      Reps  -  10-15        Interval Training   Interval Training  -  No        Treadmill   MPH  -  1      Grade  -  0      Minutes  -  15      METs  -  1.77        NuStep   Level  -  2      Minutes  -  15      METs  -  1.8         Exercise Comments: Exercise Comments    Row Name 07/24/17 [phone]           Exercise Comments  First full day of exercise!  Patient was oriented to gym and equipment including functions, settings, policies, and procedures.  Patient's individual exercise prescription and treatment plan were reviewed.  All starting workloads were established based on the results of  the 6 minute walk test done at initial orientation visit.  The plan for exercise progression was also introduced and progression will be customized based on  patient's performance and goals.          Exercise Goals and Review: Exercise Goals    Row Name 06/29/17 1437             Exercise Goals   Increase Physical Activity  Yes       Intervention  Provide advice, education, support and counseling about physical activity/exercise needs.;Develop an individualized exercise prescription for aerobic and resistive training based on initial evaluation findings, risk stratification, comorbidities and participant's personal goals.       Expected Outcomes  Short Term: Attend rehab on a regular basis to increase amount of physical activity.;Long Term: Add in home exercise to make exercise part of routine and to increase amount of physical activity.;Long Term: Exercising regularly at least 3-5 days a week.       Increase Strength and Stamina  Yes       Intervention  Provide advice, education, support and counseling about physical activity/exercise needs.;Develop an individualized exercise prescription for aerobic and resistive training based on initial evaluation findings, risk stratification, comorbidities and participant's personal goals.       Expected Outcomes  Short Term: Increase workloads from initial exercise prescription for resistance, speed, and METs.;Short Term: Perform resistance training exercises routinely during rehab and add in resistance training at home;Long Term: Improve cardiorespiratory fitness, muscular endurance and strength as measured by increased METs and functional capacity (6MWT)       Able to understand and use rate of perceived exertion (RPE) scale  Yes       Intervention  Provide education and explanation on how to use RPE scale       Expected Outcomes  Short Term: Able to use RPE daily in rehab to express subjective intensity level;Long Term:  Able to use RPE to guide  intensity level when exercising independently       Able to understand and use Dyspnea scale  Yes       Intervention  Provide education and explanation on how to use Dyspnea scale       Expected Outcomes  Short Term: Able to use Dyspnea scale daily in rehab to express subjective sense of shortness of breath during exertion;Long Term: Able to use Dyspnea scale to guide intensity level when exercising independently       Knowledge and understanding of Target Heart Rate Range (THRR)  Yes       Intervention  Provide education and explanation of THRR including how the numbers were predicted and where they are located for reference       Expected Outcomes  Short Term: Able to state/look up THRR;Long Term: Able to use THRR to govern intensity when exercising independently;Short Term: Able to use daily as guideline for intensity in rehab       Able to check pulse independently  Yes       Intervention  Provide education and demonstration on how to check pulse in carotid and radial arteries.;Review the importance of being able to check your own pulse for safety during independent exercise       Expected Outcomes  Short Term: Able to explain why pulse checking is important during independent exercise;Long Term: Able to check pulse independently and accurately       Understanding of Exercise Prescription  Yes       Intervention  Provide education, explanation, and written materials on patient's individual exercise prescription       Expected Outcomes  Short Term: Able to explain program exercise prescription;Long  Term: Able to explain home exercise prescription to exercise independently          Exercise Goals Re-Evaluation : Exercise Goals Re-Evaluation    Row Name 07/24/17 0836             Exercise Goal Re-Evaluation   Exercise Goals Review  Increase Physical Activity;Able to understand and use rate of perceived exertion (RPE) scale;Knowledge and understanding of Target Heart Rate Range  (THRR);Understanding of Exercise Prescription       Comments  Reviewed RPE scale, THR and program prescription with pt today.  Pt voiced understanding and was given a copy of goals to take home.        Expected Outcomes  Short: Use RPE daily to regulate intensity.  Long: Follow program prescription in THR.          Discharge Exercise Prescription (Final Exercise Prescription Changes): Exercise Prescription Changes - 08/02/17 1200      Response to Exercise   Blood Pressure (Admit)  112/70    Blood Pressure (Exercise)  160/70    Blood Pressure (Exit)  110/60    Heart Rate (Admit)  94 bpm    Heart Rate (Exercise)  129 bpm    Heart Rate (Exit)  113 bpm    Rating of Perceived Exertion (Exercise)  13    Symptoms  none    Comments  first full day of exercise    Duration  Progress to 45 minutes of aerobic exercise without signs/symptoms of physical distress    Intensity  THRR unchanged      Progression   Progression  Continue to progress workloads to maintain intensity without signs/symptoms of physical distress.    Average METs  1.8      Resistance Training   Training Prescription  Yes    Weight  3 lbs    Reps  10-15      Interval Training   Interval Training  No      Treadmill   MPH  1    Grade  0    Minutes  15    METs  1.77      NuStep   Level  2    Minutes  15    METs  1.8       Nutrition:  Target Goals: Understanding of nutrition guidelines, daily intake of sodium <1585m, cholesterol <2037m calories 30% from fat and 7% or less from saturated fats, daily to have 5 or more servings of fruits and vegetables.  Biometrics: Pre Biometrics - 06/29/17 1417      Pre Biometrics   Height  5' 5.5" (1.664 m)    Weight  158 lb 1.6 oz (71.7 kg)    Waist Circumference  35 inches    Hip Circumference  36 inches    Waist to Hip Ratio  0.97 %    BMI (Calculated)  25.9    Single Leg Stand  2.6 seconds        Nutrition Therapy Plan and Nutrition Goals: Nutrition Therapy &  Goals - 06/29/17 1340      Intervention Plan   Intervention  Prescribe, educate and counsel regarding individualized specific dietary modifications aiming towards targeted core components such as weight, hypertension, lipid management, diabetes, heart failure and other comorbidities.;Nutrition handout(s) given to patient.    Expected Outcomes  Short Term Goal: Understand basic principles of dietary content, such as calories, fat, sodium, cholesterol and nutrients.;Short Term Goal: A plan has been developed with personal nutrition goals set  during dietitian appointment.;Long Term Goal: Adherence to prescribed nutrition plan.       Nutrition Assessments: Nutrition Assessments - 06/29/17 1341      MEDFICTS Scores   Pre Score  33       Nutrition Goals Re-Evaluation:   Nutrition Goals Discharge (Final Nutrition Goals Re-Evaluation):   Psychosocial: Target Goals: Acknowledge presence or absence of significant depression and/or stress, maximize coping skills, provide positive support system. Participant is able to verbalize types and ability to use techniques and skills needed for reducing stress and depression.   Initial Review & Psychosocial Screening: Initial Psych Review & Screening - 06/29/17 1341      Initial Review   Current issues with  Current Anxiety/Panic;Current Sleep Concerns wakes up almost hourly due to lasix      Family Dynamics   Good Support System?  Yes Wife, daughters, friends      Screening Interventions   Interventions  Encouraged to exercise;Provide feedback about the scores to participant;Program counselor consult;To provide support and resources with identified psychosocial needs    Expected Outcomes  Short Term goal: Utilizing psychosocial counselor, staff and physician to assist with identification of specific Stressors or current issues interfering with healing process. Setting desired goal for each stressor or current issue identified.;Long Term Goal:  Stressors or current issues are controlled or eliminated.;Short Term goal: Identification and review with participant of any Quality of Life or Depression concerns found by scoring the questionnaire.;Long Term goal: The participant improves quality of Life and PHQ9 Scores as seen by post scores and/or verbalization of changes       Quality of Life Scores:  Quality of Life - 06/29/17 1342      Quality of Life Scores   Health/Function Pre  23.63 %    Socioeconomic Pre  29.17 %    Psych/Spiritual Pre  29.14 %    Family Pre  30 %    GLOBAL Pre  26.77 %      Scores of 19 and below usually indicate a poorer quality of life in these areas.  A difference of  2-3 points is a clinically meaningful difference.  A difference of 2-3 points in the total score of the Quality of Life Index has been associated with significant improvement in overall quality of life, self-image, physical symptoms, and general health in studies assessing change in quality of life.  PHQ-9: Recent Review Flowsheet Data    Depression screen [facility] 2/9 07/24/2017 06/29/2017 06/20/2017 06/07/2017 06/03/2016   Decreased Interest 0 0 0 0 0   Down, Depressed, Hopeless 0 0 0 0 0   PHQ - 2 Score 0 0 0 0 0   Altered sleeping 0 3 0 0 -   Tired, decreased energy 1 2 0 1 -   Change in appetite 1 2 0 0 -   Feeling bad or failure about yourself  0 0 0 0 -   Trouble concentrating 0 0 0 0 -   Moving slowly or fidgety/restless 1 2 0 0 -   Suicidal thoughts 0 0 0 0 -   PHQ-9 Score 3 9 0 1 -   Difficult doing work/chores Not difficult at all Not difficult at all Not difficult at all Not difficult at all -     Interpretation of Total Score  Total Score Depression Severity:  1-4 = Minimal depression, 5-9 = Mild depression, 10-14 = Moderate depression, 15-19 = Moderately severe depression, 20-27 = Severe depression   Psychosocial Evaluation and Intervention:  Psychosocial Evaluation - 07/24/17 1019      Psychosocial Evaluation & Interventions    Comments  Note - Counselor reviewed the PHQ-9 depression inventory with Aki which was done before his most recent hospitalization.  His scores at that time were "9" indicated mild-moderate depression.  Today Daegan's score was a "3" indicating minimal depression - which he attributes to his inability to return to normal activities.  This program will be helpful in accomplishing this goal.        Psychosocial Re-Evaluation:   Psychosocial Discharge (Final Psychosocial Re-Evaluation):   Vocational Rehabilitation: Provide vocational rehab assistance to qualifying candidates.   Vocational Rehab Evaluation & Intervention: Vocational Rehab - 06/29/17 1346      Initial Vocational Rehab Evaluation & Intervention   Assessment shows need for Vocational Rehabilitation  No       Education: Education Goals: Education classes will be provided on a variety of topics geared toward better understanding of heart health and risk factor modification. Participant will state understanding/return demonstration of topics presented as noted by education test scores.  Learning Barriers/Preferences: Learning Barriers/Preferences - 06/29/17 1345      Learning Barriers/Preferences   Learning Barriers  Hearing    Learning Preferences  Audio;Verbal Instruction;Video       Education Topics:  AED/CPR: - Group verbal and written instruction with the use of models to demonstrate the basic use of the AED with the basic ABC's of resuscitation.   General Nutrition Guidelines/Fats and Fiber: -Group instruction provided by verbal, written material, models and posters to present the general guidelines for heart healthy nutrition. Gives an explanation and review of dietary fats and fiber.   Controlling Sodium/Reading Food Labels: -Group verbal and written material supporting the discussion of sodium use in heart healthy nutrition. Review and explanation with models, verbal and written materials for utilization of  the food label.   Exercise Physiology & General Exercise Guidelines: - Group verbal and written instruction with models to review the exercise physiology of the cardiovascular system and associated critical values. Provides general exercise guidelines with specific guidelines to those with heart or lung disease.    Aerobic Exercise & Resistance Training: - Gives group verbal and written instruction on the various components of exercise. Focuses on aerobic and resistive training programs and the benefits of this training and how to safely progress through these programs..   Flexibility, Balance, Mind/Body Relaxation: Provides group verbal/written instruction on the benefits of flexibility and balance training, including mind/body exercise modes such as yoga, pilates and tai chi.  Demonstration and skill practice provided.   Stress and Anxiety: - Provides group verbal and written instruction about the health risks of elevated stress and causes of high stress.  Discuss the correlation between heart/lung disease and anxiety and treatment options. Review healthy ways to manage with stress and anxiety.   Depression: - Provides group verbal and written instruction on the correlation between heart/lung disease and depressed mood, treatment options, and the stigmas associated with seeking treatment.   Anatomy & Physiology of the Heart: - Group verbal and written instruction and models provide basic cardiac anatomy and physiology, with the coronary electrical and arterial systems. Review of Valvular disease and Heart Failure   Cardiac Procedures: - Group verbal and written instruction to review commonly prescribed medications for heart disease. Reviews the medication, class of the drug, and side effects. Includes the steps to properly store meds and maintain the prescription regimen. (beta blockers and nitrates)   Cardiac Medications I: -  Group verbal and written instruction to review commonly  prescribed medications for heart disease. Reviews the medication, class of the drug, and side effects. Includes the steps to properly store meds and maintain the prescription regimen.   Cardiac Medications II: -Group verbal and written instruction to review commonly prescribed medications for heart disease. Reviews the medication, class of the drug, and side effects. (all other drug classes)    Go Sex-Intimacy & Heart Disease, Get SMART - Goal Setting: - Group verbal and written instruction through game format to discuss heart disease and the return to sexual intimacy. Provides group verbal and written material to discuss and apply goal setting through the application of the S.M.A.R.T. Method.   Other Matters of the Heart: - Provides group verbal, written materials and models to describe Stable Angina and Peripheral Artery. Includes description of the disease process and treatment options available to the cardiac patient.   Exercise & Equipment Safety: - Individual verbal instruction and demonstration of equipment use and safety with use of the equipment.   Cardiac Rehab from 07/24/2017 in [facility] Cardiac and Pulmonary Rehab  Date  06/29/17  Educator  [facility]  Instruction Review Code  1- Verbalizes Understanding      Infection Prevention: - Provides verbal and written material to individual with discussion of infection control including proper hand washing and proper equipment cleaning during exercise session.   Cardiac Rehab from 07/24/2017 in [facility] Cardiac and Pulmonary Rehab  Date  06/29/17  Educator  [facility]  Instruction Review Code  1- Verbalizes Understanding      Falls Prevention: - Provides verbal and written material to individual with discussion of falls prevention and safety.   Cardiac Rehab from 07/24/2017 in [facility] Cardiac and Pulmonary Rehab  Date  06/29/17  Educator  [facility]  Instruction Review Code  1- Verbalizes Understanding      Diabetes: - Individual verbal and written  instruction to review signs/symptoms of diabetes, desired ranges of glucose level fasting, after meals and with exercise. Acknowledge that pre and post exercise glucose checks will be done for 3 sessions at entry of program.   Cardiac Rehab from 07/24/2017 in [facility] Cardiac and Pulmonary Rehab  Date  06/29/17  Educator  [facility]  Instruction Review Code  1- Verbalizes Understanding      Know Your Numbers and Risk Factors: -Group verbal and written instruction about important numbers in your health.  Discussion of what are risk factors and how they play a role in the disease process.  Review of Cholesterol, Blood Pressure, Diabetes, and BMI and the role they play in your overall health.   Sleep Hygiene: -Provides group verbal and written instruction about how sleep can affect your health.  Define sleep hygiene, discuss sleep cycles and impact of sleep habits. Review good sleep hygiene tips.    Other: -Provides group and verbal instruction on various topics (see comments)   Knowledge Questionnaire Score: Knowledge Questionnaire Score - 06/29/17 1346      Knowledge Questionnaire Score   Pre Score  24/28 Correct answers reviewed with Fritz Pickerel       Core Components/Risk Factors/Patient Goals at Admission: Personal Goals and Risk Factors at Admission - 06/29/17 1338      Core Components/Risk Factors/Patient Goals on Admission    Weight Management  Yes;Weight Loss    Intervention  Weight Management: Develop a combined nutrition and exercise program designed to reach desired caloric intake, while maintaining appropriate intake of nutrient and fiber, sodium and fats, and appropriate energy expenditure  required for the weight goal.;Weight Management: Provide education and appropriate resources to help participant work on and attain dietary goals.;Weight Management/Obesity: Establish reasonable short term and long term weight goals.    Admit Weight  156 lb (70.8 kg)    Goal Weight: Short Term  152 lb  (68.9 kg)    Goal Weight: Long Term  150 lb (68 kg)    Expected Outcomes  Short Term: Continue to assess and modify interventions until short term weight is achieved;Long Term: Adherence to nutrition and physical activity/exercise program aimed toward attainment of established weight goal;Weight Loss: Understanding of general recommendations for a balanced deficit meal plan, which promotes 1-2 lb weight loss per week and includes a negative energy balance of [phone] kcal/d;Understanding recommendations for meals to include 15-35% energy as protein, 25-35% energy from fat, 35-60% energy from carbohydrates, less than 250m of dietary cholesterol, 20-35 gm of total fiber daily;Understanding of distribution of calorie intake throughout the day with the consumption of 4-5 meals/snacks    Diabetes  Yes    Intervention  Provide education about signs/symptoms and action to take for hypo/hyperglycemia.;Provide education about proper nutrition, including hydration, and aerobic/resistive exercise prescription along with prescribed medications to achieve blood glucose in normal ranges: Fasting glucose 65-99 mg/dL    Expected Outcomes  Short Term: Participant verbalizes understanding of the signs/symptoms and immediate care of hyper/hypoglycemia, proper foot care and importance of medication, aerobic/resistive exercise and nutrition plan for blood glucose control.;Long Term: Attainment of HbA1C < 7%.    Heart Failure  Yes    Intervention  Provide a combined exercise and nutrition program that is supplemented with education, support and counseling about heart failure. Directed toward relieving symptoms such as shortness of breath, decreased exercise tolerance, and extremity edema.    Expected Outcomes  Improve functional capacity of life;Short term: Attendance in program 2-3 days a week with increased exercise capacity. Reported lower sodium intake. Reported increased fruit and vegetable intake. Reports medication  compliance.;Short term: Daily weights obtained and reported for increase. Utilizing diuretic protocols set by physician.;Long term: Adoption of self-care skills and reduction of barriers for early signs and symptoms recognition and intervention leading to self-care maintenance.    Hypertension  Yes    Intervention  Provide education on lifestyle modifcations including regular physical activity/exercise, weight management, moderate sodium restriction and increased consumption of fresh fruit, vegetables, and low fat dairy, alcohol moderation, and smoking cessation.;Monitor prescription use compliance.    Expected Outcomes  Short Term: Continued assessment and intervention until BP is < 140/943mHG in hypertensive participants. < 130/8030mG in hypertensive participants with diabetes, heart failure or chronic kidney disease.;Long Term: Maintenance of blood pressure at goal levels.    Lipids  Yes    Intervention  Provide education and support for participant on nutrition & aerobic/resistive exercise along with prescribed medications to achieve LDL <55m60mDL >40mg42m Expected Outcomes  Short Term: Participant states understanding of desired cholesterol values and is compliant with medications prescribed. Participant is following exercise prescription and nutrition guidelines.;Long Term: Cholesterol controlled with medications as prescribed, with individualized exercise RX and with personalized nutrition plan. Value goals: LDL < 55mg,7m > 40 mg.       Core Components/Risk Factors/Patient Goals Review:    Core Components/Risk Factors/Patient Goals at Discharge (Final Review):    ITP Comments: ITP Comments    Row Name 06/29/17 1318 07/10/17 1629 07/12/17 0606 07/17/17 1608 07/20/17 1207   ITP Comments  Med Review completed.  Initial ITP created. Diagnosis can be found in [facility] 06/10/17  Makya presented to the ED today from Dr. Keturah Barre office.  He was hypotensive and anemic.  He has also had nausea and  diarehha over the past week.  We will continue to follow from afar until clearance recieved to start rehab.   30 Day review. Continue with ITP unless directed changes per Medical Director review.    New to program  Fain has been readmitted for heart failure and renal failure.  We will monitor his progress in hospital and wait for clearance for him to return.   Alim was discharged yesterday.  After discussion with doctor, patient, [place] and RN he is going to start on Monday next week.  From Diane:  Dr. Clayborn Bigness rounded on patient while here in hospital.  Dr. Saralyn Pilar is patient's primary cardiologist.  I spoke with Dr. Saralyn Pilar and there is a note in [facility] that patient may return to Cardiac Rehab.  Patient wants to participate, as he says he needs this.  He will probably return next week, as he is being discharged today.  I am so proud of this patient, as he quit smoking a month ago, has been weighing himself every day, and has been limiting his sodium intake.     [place] Name 08/02/17 1450           ITP Comments  Called to check on status of return to rehab.  He has been going to the gym on his own.  His insurance doesn't cover the program at 100%.  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  We will discharge him from the program at this time.   Discharge ITP created and sent to Dr. Sabra Heck.           Comments: Discharge ITP

## 2017-08-02 NOTE — Telephone Encounter (Signed)
Called to check on status of return to rehab.  He has been going to the gym on his own.  His insurance doesn't cover the program at 100%.  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  We will discharge him from the program at this time.

## 2017-08-02 NOTE — Progress Notes (Signed)
Discharge Progress Report  Patient Details  Name: Thomas [name] MRN: [id number] Date of Birth: [birth year] Referring Provider:     Cardiac Rehab from 06/29/2017 in [facility]  Referring Provider  Paraschos       Number of Visits: 3/36  Reason for Discharge:  Early Exit:  Personal, Lack of attendance and Insuarance Copays  Smoking History:  Social History   Tobacco Use  Smoking Status Former Smoker  . Packs/day: 1.00  . Years: 51.00  . Pack years: 51.00  . Types: Cigarettes  . Last attempt to quit: 06/21/2017  . Years since quitting: 0.1  Smokeless Tobacco Never Used  Tobacco Comment   started smoking at age [age]. Smoked for 50 years    Diagnosis:  NSTEMI (non-ST elevated myocardial infarction) ([place])  Status post coronary artery stent placement  ADL [facility]:   Initial Exercise Prescription: Initial Exercise Prescription - 06/29/17 1400      Date of Initial Exercise RX and Referring Provider   Date  06/29/17    Referring Provider  Paraschos      Treadmill   MPH  1.5    Grade  0    Minutes  15    METs  2.15      Recumbant Bike   Level  1    RPM  60    Watts  9    Minutes  15    METs  2      NuStep   Level  1    SPM  80    Minutes  15    METs  2      Track   Laps  25    Minutes  15    METs  2.15      Prescription Details   Frequency (times per week)  3    Duration  Progress to 45 minutes of aerobic exercise without signs/symptoms of physical distress      Intensity   THRR 40-80% of Max Heartrate  96-130    Ratings of Perceived Exertion  11-13    Perceived Dyspnea  0-4      Resistance Training   Training Prescription  Yes    Weight  3 lb    Reps  10-15       Discharge Exercise Prescription (Final Exercise Prescription Changes): Exercise Prescription Changes - 08/02/17 1200      Response to Exercise   Blood Pressure (Admit)  112/70    Blood Pressure (Exercise)  160/70    Blood Pressure (Exit)  110/60    Heart  Rate (Admit)  94 bpm    Heart Rate (Exercise)  129 bpm    Heart Rate (Exit)  113 bpm    Rating of Perceived Exertion (Exercise)  13    Symptoms  none    Comments  first full day of exercise    Duration  Progress to 45 minutes of aerobic exercise without signs/symptoms of physical distress    Intensity  THRR unchanged      Progression   Progression  Continue to progress workloads to maintain intensity without signs/symptoms of physical distress.    Average METs  1.8      Resistance Training   Training Prescription  Yes    Weight  3 lbs    Reps  10-15      Interval Training   Interval Training  No      Treadmill   MPH  1  Grade  0    Minutes  15    METs  1.77      NuStep   Level  2    Minutes  15    METs  1.8       Functional Capacity: 6 Minute Walk    Row Name 06/29/17 1424         6 Minute Walk   Distance  1070 feet     Walk Time  6 minutes     # of Rest Breaks  0     MPH  2     METS  2.4     RPE  13     Perceived Dyspnea   1     VO2 Peak  8.4     Symptoms  Yes (comment)     Comments  leg fatigue/shins "burn" 5/10     Resting HR  63 bpm     Resting BP  110/60     Resting Oxygen Saturation   98 %     Exercise Oxygen Saturation  during 6 min walk  97 %     Max Ex. HR  98 bpm     Max Ex. BP  134/66     2 Minute Post BP  120/76        Psychological, QOL, Others - Outcomes: PHQ 2/9: Depression screen [facility] 2/9 07/24/2017 06/29/2017 06/20/2017 06/07/2017 06/03/2016  Decreased Interest 0 0 0 0 0  Down, Depressed, Hopeless 0 0 0 0 0  PHQ - 2 Score 0 0 0 0 0  Altered sleeping 0 3 0 0 -  Tired, decreased energy 1 2 0 1 -  Change in appetite 1 2 0 0 -  Feeling bad or failure about yourself  0 0 0 0 -  Trouble concentrating 0 0 0 0 -  Moving slowly or fidgety/restless 1 2 0 0 -  Suicidal thoughts 0 0 0 0 -  PHQ-9 Score 3 9 0 1 -  Difficult doing work/chores Not difficult at all Not difficult at all Not difficult at all Not difficult at all -    Quality of  Life: Quality of Life - 06/29/17 1342      Quality of Life Scores   Health/Function Pre  23.63 %    Socioeconomic Pre  29.17 %    Psych/Spiritual Pre  29.14 %    Family Pre  30 %    GLOBAL Pre  26.77 %       Personal Goals: Goals established at orientation with interventions provided to work toward goal. Personal Goals and Risk Factors at Admission - 06/29/17 1338      Core Components/Risk Factors/Patient Goals on Admission    Weight Management  Yes;Weight Loss    Intervention  Weight Management: Develop a combined nutrition and exercise program designed to reach desired caloric intake, while maintaining appropriate intake of nutrient and fiber, sodium and fats, and appropriate energy expenditure required for the weight goal.;Weight Management: Provide education and appropriate resources to help participant work on and attain dietary goals.;Weight Management/Obesity: Establish reasonable short term and long term weight goals.    Admit Weight  156 lb (70.8 kg)    Goal Weight: Short Term  152 lb (68.9 kg)    Goal Weight: Long Term  150 lb (68 kg)    Expected Outcomes  Short Term: Continue to assess and modify interventions until short term weight is achieved;Long Term: Adherence to nutrition and physical activity/exercise program  aimed toward attainment of established weight goal;Weight Loss: Understanding of general recommendations for a balanced deficit meal plan, which promotes 1-2 lb weight loss per week and includes a negative energy balance of [phone] kcal/d;Understanding recommendations for meals to include 15-35% energy as protein, 25-35% energy from fat, 35-60% energy from carbohydrates, less than 200mg  of dietary cholesterol, 20-35 gm of total fiber daily;Understanding of distribution of calorie intake throughout the day with the consumption of 4-5 meals/snacks    Diabetes  Yes    Intervention  Provide education about signs/symptoms and action to take for hypo/hyperglycemia.;Provide  education about proper nutrition, including hydration, and aerobic/resistive exercise prescription along with prescribed medications to achieve blood glucose in normal ranges: Fasting glucose 65-99 mg/dL    Expected Outcomes  Short Term: Participant verbalizes understanding of the signs/symptoms and immediate care of hyper/hypoglycemia, proper foot care and importance of medication, aerobic/resistive exercise and nutrition plan for blood glucose control.;Long Term: Attainment of HbA1C < 7%.    Heart Failure  Yes    Intervention  Provide a combined exercise and nutrition program that is supplemented with education, support and counseling about heart failure. Directed toward relieving symptoms such as shortness of breath, decreased exercise tolerance, and extremity edema.    Expected Outcomes  Improve functional capacity of life;Short term: Attendance in program 2-3 days a week with increased exercise capacity. Reported lower sodium intake. Reported increased fruit and vegetable intake. Reports medication compliance.;Short term: Daily weights obtained and reported for increase. Utilizing diuretic protocols set by physician.;Long term: Adoption of self-care skills and reduction of barriers for early signs and symptoms recognition and intervention leading to self-care maintenance.    Hypertension  Yes    Intervention  Provide education on lifestyle modifcations including regular physical activity/exercise, weight management, moderate sodium restriction and increased consumption of fresh fruit, vegetables, and low fat dairy, alcohol moderation, and smoking cessation.;Monitor prescription use compliance.    Expected Outcomes  Short Term: Continued assessment and intervention until BP is < 140/57mm HG in hypertensive participants. < 130/38mm HG in hypertensive participants with diabetes, heart failure or chronic kidney disease.;Long Term: Maintenance of blood pressure at goal levels.    Lipids  Yes    Intervention   Provide education and support for participant on nutrition & aerobic/resistive exercise along with prescribed medications to achieve LDL 70mg , HDL >40mg .    Expected Outcomes  Short Term: Participant states understanding of desired cholesterol values and is compliant with medications prescribed. Participant is following exercise prescription and nutrition guidelines.;Long Term: Cholesterol controlled with medications as prescribed, with individualized exercise RX and with personalized nutrition plan. Value goals: LDL < 70mg , HDL > 40 mg.        Personal Goals Discharge:   Exercise Goals and Review: Exercise Goals    Row Name 06/29/17 1437             Exercise Goals   Increase Physical Activity  Yes       Intervention  Provide advice, education, support and counseling about physical activity/exercise needs.;Develop an individualized exercise prescription for aerobic and resistive training based on initial evaluation findings, risk stratification, comorbidities and participant's personal goals.       Expected Outcomes  Short Term: Attend rehab on a regular basis to increase amount of physical activity.;Long Term: Add in home exercise to make exercise part of routine and to increase amount of physical activity.;Long Term: Exercising regularly at least 3-5 days a week.       Increase Strength and  Stamina  Yes       Intervention  Provide advice, education, support and counseling about physical activity/exercise needs.;Develop an individualized exercise prescription for aerobic and resistive training based on initial evaluation findings, risk stratification, comorbidities and participant's personal goals.       Expected Outcomes  Short Term: Increase workloads from initial exercise prescription for resistance, speed, and METs.;Short Term: Perform resistance training exercises routinely during rehab and add in resistance training at home;Long Term: Improve cardiorespiratory fitness, muscular  endurance and strength as measured by increased METs and functional capacity (6MWT)       Able to understand and use rate of perceived exertion (RPE) scale  Yes       Intervention  Provide education and explanation on how to use RPE scale       Expected Outcomes  Short Term: Able to use RPE daily in rehab to express subjective intensity level;Long Term:  Able to use RPE to guide intensity level when exercising independently       Able to understand and use Dyspnea scale  Yes       Intervention  Provide education and explanation on how to use Dyspnea scale       Expected Outcomes  Short Term: Able to use Dyspnea scale daily in rehab to express subjective sense of shortness of breath during exertion;Long Term: Able to use Dyspnea scale to guide intensity level when exercising independently       Knowledge and understanding of Target Heart Rate Range (THRR)  Yes       Intervention  Provide education and explanation of THRR including how the numbers were predicted and where they are located for reference       Expected Outcomes  Short Term: Able to state/look up THRR;Long Term: Able to use THRR to govern intensity when exercising independently;Short Term: Able to use daily as guideline for intensity in rehab       Able to check pulse independently  Yes       Intervention  Provide education and demonstration on how to check pulse in carotid and radial arteries.;Review the importance of being able to check your own pulse for safety during independent exercise       Expected Outcomes  Short Term: Able to explain why pulse checking is important during independent exercise;Long Term: Able to check pulse independently and accurately       Understanding of Exercise Prescription  Yes       Intervention  Provide education, explanation, and written materials on patient's individual exercise prescription       Expected Outcomes  Short Term: Able to explain program exercise prescription;Long Term: Able to explain  home exercise prescription to exercise independently          Nutrition & Weight - Outcomes: Pre Biometrics - 06/29/17 1417      Pre Biometrics   Height  5' 5.5" (1.664 m)    Weight  158 lb 1.6 oz (71.7 kg)    Waist Circumference  35 inches    Hip Circumference  36 inches    Waist to Hip Ratio  0.97 %    BMI (Calculated)  25.9    Single Leg Stand  2.6 seconds        Nutrition: Nutrition Therapy & Goals - 06/29/17 1340      Intervention Plan   Intervention  Prescribe, educate and counsel regarding individualized specific dietary modifications aiming towards targeted core components such as weight, hypertension, lipid management, diabetes,  heart failure and other comorbidities.;Nutrition handout(s) given to patient.    Expected Outcomes  Short Term Goal: Understand basic principles of dietary content, such as calories, fat, sodium, cholesterol and nutrients.;Short Term Goal: A plan has been developed with personal nutrition goals set during dietitian appointment.;Long Term Goal: Adherence to prescribed nutrition plan.       Nutrition Discharge: Nutrition Assessments - 06/29/17 1341      MEDFICTS Scores   Pre Score  33       Education Questionnaire Score: Knowledge Questionnaire Score - 06/29/17 1346      Knowledge Questionnaire Score   Pre Score  24/28 Correct answers reviewed with Fritz Pickerel       Goals reviewed with patient; copy given to patient.

## 2017-08-09 ENCOUNTER — Ambulatory Visit: Payer: Self-pay | Admitting: Family

## 2017-08-12 MED ORDER — SODIUM CHLORIDE FLUSH 0.9 % IV SOLN
INTRAVENOUS | Status: AC
  Filled 2017-08-12: qty 10

## 2017-08-18 ENCOUNTER — Other Ambulatory Visit: Payer: Self-pay | Admitting: Family Medicine

## 2017-08-18 NOTE — Telephone Encounter (Signed)
Pharmacy requesting refills. Thanks!  

## 2017-08-21 ENCOUNTER — Other Ambulatory Visit: Payer: Self-pay | Admitting: Family Medicine

## 2017-08-21 MED ORDER — TAMSULOSIN HCL 0.4 MG PO CAPS: 0.4000 mg | ORAL_CAPSULE | Freq: Every day | ORAL | 5 refills | Status: DC

## 2017-08-21 NOTE — Telephone Encounter (Signed)
Pt needs refill on his tamsulosin 0.4 mg  He uses [facility]  [place]

## 2017-09-04 ENCOUNTER — Ambulatory Visit: Payer: Self-pay | Admitting: Family Medicine

## 2017-09-08 ENCOUNTER — Other Ambulatory Visit: Payer: Self-pay | Admitting: Family Medicine

## 2017-09-08 DIAGNOSIS — N183 Chronic kidney disease, stage 3 unspecified: Secondary | ICD-10-CM

## 2017-09-08 DIAGNOSIS — E1122 Type 2 diabetes mellitus with diabetic chronic kidney disease: Secondary | ICD-10-CM

## 2017-09-12 ENCOUNTER — Encounter: Payer: Self-pay | Admitting: Family Medicine

## 2017-09-12 ENCOUNTER — Ambulatory Visit (INDEPENDENT_AMBULATORY_CARE_PROVIDER_SITE_OTHER): Payer: Medicare Other | Admitting: Family Medicine

## 2017-09-12 VITALS — BP 160/80 | HR 75 | Temp 97.8°F | Resp 16 | Wt 163.0 lb

## 2017-09-12 DIAGNOSIS — N183 Chronic kidney disease, stage 3 (moderate): Secondary | ICD-10-CM

## 2017-09-12 DIAGNOSIS — M109 Gout, unspecified: Secondary | ICD-10-CM

## 2017-09-12 DIAGNOSIS — E1122 Type 2 diabetes mellitus with diabetic chronic kidney disease: Secondary | ICD-10-CM

## 2017-09-12 DIAGNOSIS — I10 Essential (primary) hypertension: Secondary | ICD-10-CM | POA: Diagnosis not present

## 2017-09-12 LAB — POCT GLYCOSYLATED HEMOGLOBIN (HGB A1C)
Est. average glucose Bld gHb Est-mCnc: 223
HEMOGLOBIN A1C: 9.4 % — AB (ref 4.0–5.6)

## 2017-09-12 MED ORDER — COLCHICINE 0.6 MG PO TABS: 0.6000 mg | ORAL_TABLET | Freq: Every day | ORAL | 1 refills | Status: DC | PRN

## 2017-09-12 MED ORDER — DICLOFENAC SODIUM 1 % TD GEL: 2.0000 g | Freq: Four times a day (QID) | TRANSDERMAL | 1 refills | Status: DC | PRN

## 2017-09-12 MED ORDER — GLIPIZIDE ER 2.5 MG PO TB24: 5.0000 mg | ORAL_TABLET | Freq: Every day | ORAL | 0 refills | Status: DC

## 2017-09-12 NOTE — Progress Notes (Signed)
Patient: Thomas [name] Male    DOB: 12-06-1945   72 y.o.   MRN: [id number] Visit Date: 09/12/2017  Today's Provider: Lelon Huh, MD   Chief Complaint  Patient presents with  . Diabetes  . Hypertension   Subjective:    HPI   Diabetes Mellitus Type II, Follow-up:   Lab Results  Component Value Date   HGBA1C 7.6 (H) 06/30/2017   HGBA1C 7.6 01/31/2017   HGBA1C 7.9 09/30/2016   Last seen for diabetes 7 weeks ago.  Management since then includes; started back on glipizide 2.5 mg qd and advised he can take a 2nd 2.5 mg tablet per day if he see's blood sugar ia over 300. At that time he had been taken off of metformin during recent hospitalization due to worsening renal functions. He has since had follow up with nephology and reports kidney functions are improving.  He reports good compliance with treatment. He is not having side effects.  Current symptoms include none and have been stable. Home blood sugar records: fasting range: low 200's  Episodes of hypoglycemia? no   Current Insulin Regimen: none Most Recent Eye Exam: 01/2017 Weight trend: increasing steadily Prior visit with dietician: no Current diet: well balanced Current exercise: walking  ------------------------------------------------------------------------   Hypertension, follow-up:  BP Readings from Last 3 Encounters:  09/12/17 (!) 160/80  07/24/17 124/64  07/18/17 118/71    He was last seen for hypertension 2 months ago.  BP at that visit was 132/70. Management since that visit includes; labs checked, no changes.He reports good compliance with treatment. He is not having side effects.  He is exercising. He is adherent to low salt diet.   Outside blood pressures are normal per patient reoprt. He is experiencing lower extremity edema.  Patient denies chest pain, chest pressure/discomfort, claudication, dyspnea, exertional chest pressure/discomfort, fatigue, irregular heart beat,  near-syncope, orthopnea, palpitations, paroxysmal nocturnal dyspnea, syncope and tachypnea.   Cardiovascular risk factors include diabetes mellitus, hypertension and male gender.  Use of agents associated with hypertension: NSAIDS.   ------------------------------------------------------------------------  He also reports that right great toe joint has been painful for the last week. Is tender. He used some of his wife's diclofenac gel which helped. He has been prescribed colchicine for gout in the past but doesn't have any now.   Allergies  Allergen Reactions  . Ciprofloxacin Itching and Swelling    Facial swelling      Current Outpatient Medications:  .  acetaminophen (TYLENOL) 500 MG tablet, Take 500 mg by mouth every 8 (eight) hours as needed for mild pain or headache., Disp: , Rfl:  .  aspirin EC 81 MG tablet, Take 81 mg by mouth daily., Disp: , Rfl:  .  atorvastatin (LIPITOR) 80 MG tablet, TAKE 1 TABLET BY MOUTH ONCE DAILY, Disp: 30 tablet, Rfl: 5 .  b complex vitamins capsule, Take 1 capsule by mouth daily., Disp: , Rfl:  .  buPROPion (WELLBUTRIN SR) 150 MG 12 hr tablet, Take 1 tablet (150 mg total) by mouth 2 (two) times daily., Disp: , Rfl:  .  clopidogrel (PLAVIX) 75 MG tablet, Take 1 tablet (75 mg total) by mouth daily., Disp: 30 tablet, Rfl: 1 .  Ferrous Sulfate (IRON) 325 (65 Fe) MG TABS, Take 325 mg by mouth 2 (two) times daily., Disp: , Rfl:  .  furosemide (LASIX) 40 MG tablet, Take 1 tablet (40 mg total) by mouth 2 (two) times daily., Disp: 60 tablet, Rfl: 0 .  glipiZIDE (GLUCOTROL XL) 2.5 MG 24 hr tablet, TAKE 1 TABLET BY MOUTH ONCE DAILY WITH BREAKFAST,MAY TAKE 2ND TABLET AS NEEDED IF YOUR PM BLOOD SUGAR IS OVER 250, Disp: 45 tablet, Rfl: 3 .  hydrALAZINE (APRESOLINE) 25 MG tablet, Take 1 tablet (25 mg total) by mouth every 12 (twelve) hours., Disp: 60 tablet, Rfl: 5 .  isosorbide mononitrate (IMDUR) 60 MG 24 hr tablet, Take 1 tablet (60 mg total) by mouth daily., Disp: 30  tablet, Rfl: 5 .  metoprolol succinate (TOPROL-XL) 50 MG 24 hr tablet, Take 1 tablet (50 mg total) by mouth daily. Take with or immediately following a meal., Disp: 30 tablet, Rfl: 5 .  pantoprazole (PROTONIX) 40 MG tablet, Take 1 tablet (40 mg total) by mouth daily. (Patient taking differently: Take 40 mg by mouth 2 (two) times daily. ), Disp: 30 tablet, Rfl: 12 .  tamsulosin (FLOMAX) 0.4 MG CAPS capsule, Take 1 capsule (0.4 mg total) by mouth daily after supper., Disp: 30 capsule, Rfl: 5 .  vitamin B-12 (CYANOCOBALAMIN) 1000 MCG tablet, Take 5,000 mcg by mouth daily., Disp: , Rfl:   Review of Systems  Constitutional: Negative for appetite change, chills and fever.  Respiratory: Negative for chest tightness, shortness of breath and wheezing.   Cardiovascular: Positive for leg swelling (in ankles). Negative for chest pain and palpitations.  Gastrointestinal: Negative for abdominal pain, nausea and vomiting.    Social History   Tobacco Use  . Smoking status: Former Smoker    Packs/day: 1.00    Years: 51.00    Pack years: 51.00    Types: Cigarettes    Last attempt to quit: 06/21/2017    Years since quitting: 0.2  . Smokeless tobacco: Never Used  . Tobacco comment: started smoking at age [age]. Smoked for 50 years  Substance Use Topics  . Alcohol use: Yes    Alcohol/week: 0.0 oz    Frequency: Never    Comment: 1 every 6 months   Objective:   BP (!) 160/80 (BP Location: Right Arm, Cuff Size: Normal)   Pulse 75   Temp 97.8 F (36.6 C) (Oral)   Resp 16   Wt 163 lb (73.9 kg)   SpO2 97% Comment: room air  BMI 26.31 kg/m  Vitals:   09/12/17 0940 09/12/17 0947  BP: (!) 160/84 (!) 160/80  Pulse: 75   Resp: 16   Temp: 97.8 F (36.6 C)   TempSrc: Oral   SpO2: 97%   Weight: 163 lb (73.9 kg)      Physical Exam   General appearance: alert, well developed, well nourished, cooperative and in no distress Head: Normocephalic, without obvious abnormality, atraumatic Respiratory:  Respirations even and unlabored, normal respiratory rate Extremities: No gross deformities Skin: Skin color, texture, turgor normal. No rashes seen  Psych: Appropriate mood and affect. MS:  Slight tender and swollen right great MTP joint c.w gout.   Results for orders placed or performed in visit on 09/12/17  POCT HgB A1C  Result Value Ref Range   Hemoglobin A1C 9.4 (A) 4.0 - 5.6 %   HbA1c, POC (prediabetic range)  5.7 - 6.4 %   HbA1c, POC (controlled diabetic range)  0.0 - 7.0 %   Est. average glucose Bld gHb Est-mCnc 223         Assessment & Plan:     1. Type 2 diabetes mellitus with stage 3 chronic kidney disease, without long-term current use of insulin ([place]) Off metformin due to poor kidney functions. Will  increase  glipiZIDE (GLUCOTROL XL) to 2.5 MG 24 hr tablet; Take 2 tablets (5 mg total) by mouth daily with breakfast.  Dispense: 1 tablet; Refill: 0 Had 30 tablets dispensed on 5-24 so weill send new prescription for 5mg  to [facility] next week.   2. Essential (primary) hypertension Uncontrolled. Avoid sodium in diet continue current meds. Follow up nephroogy as scheduled.   3. Gout involving toe of right foot, unspecified cause, unspecified chronicity  - colchicine 0.6 MG tablet; Take 1 tablet (0.6 mg total) by mouth daily as needed (gout).  Dispense: 30 tablet; Refill: 1 - diclofenac sodium (VOLTAREN) 1 % GEL; Apply 2 g topically 4 (four) times daily as needed.  Dispense: 25 g; Refill: 1   Return in about 3 months (around 12/13/2017).       Lelon Huh, MD  [facility]

## 2017-09-12 NOTE — Patient Instructions (Signed)
   Increase glipizide to 5mg  a day. You can take 2 of the 2.5 mg tablets a day until your current bottle runs out

## 2017-09-14 ENCOUNTER — Telehealth (INDEPENDENT_AMBULATORY_CARE_PROVIDER_SITE_OTHER): Payer: Self-pay

## 2017-09-14 NOTE — Telephone Encounter (Signed)
Patient called to see if he can cancel the upcoming appointment on September 19, 2017? He states that he has been doing well with his legs, he has been in and out of the hospital over the last month and has a lot of other things going on health-wise right now.  Do you see where it is pertinent for the patient to keep this appointment with the ultrasound or not? If so, he says that he will keep it and come on in.

## 2017-09-14 NOTE — Telephone Encounter (Signed)
Called the patient back to let him know that he can reschedule his June appointment. He said that he would do that. I just transferred the call to [place].

## 2017-09-19 ENCOUNTER — Encounter (INDEPENDENT_AMBULATORY_CARE_PROVIDER_SITE_OTHER): Payer: Self-pay

## 2017-09-19 ENCOUNTER — Ambulatory Visit (INDEPENDENT_AMBULATORY_CARE_PROVIDER_SITE_OTHER): Payer: Self-pay | Admitting: Vascular Surgery

## 2017-09-20 ENCOUNTER — Other Ambulatory Visit: Payer: Self-pay | Admitting: Family Medicine

## 2017-09-20 DIAGNOSIS — N183 Chronic kidney disease, stage 3 unspecified: Secondary | ICD-10-CM

## 2017-09-20 DIAGNOSIS — E1122 Type 2 diabetes mellitus with diabetic chronic kidney disease: Secondary | ICD-10-CM

## 2017-09-20 MED ORDER — GLIPIZIDE ER 5 MG PO TB24: 5.0000 mg | ORAL_TABLET | Freq: Every day | ORAL | 5 refills | Status: DC

## 2017-09-28 DIAGNOSIS — R808 Other proteinuria: Secondary | ICD-10-CM | POA: Diagnosis not present

## 2017-09-28 DIAGNOSIS — N184 Chronic kidney disease, stage 4 (severe): Secondary | ICD-10-CM | POA: Diagnosis not present

## 2017-09-28 DIAGNOSIS — E875 Hyperkalemia: Secondary | ICD-10-CM | POA: Diagnosis not present

## 2017-09-28 DIAGNOSIS — R600 Localized edema: Secondary | ICD-10-CM | POA: Diagnosis not present

## 2017-09-28 DIAGNOSIS — E1129 Type 2 diabetes mellitus with other diabetic kidney complication: Secondary | ICD-10-CM | POA: Diagnosis not present

## 2017-10-02 DIAGNOSIS — E875 Hyperkalemia: Secondary | ICD-10-CM | POA: Diagnosis not present

## 2017-10-02 DIAGNOSIS — R808 Other proteinuria: Secondary | ICD-10-CM | POA: Diagnosis not present

## 2017-10-02 DIAGNOSIS — N184 Chronic kidney disease, stage 4 (severe): Secondary | ICD-10-CM | POA: Diagnosis not present

## 2017-10-02 DIAGNOSIS — E1129 Type 2 diabetes mellitus with other diabetic kidney complication: Secondary | ICD-10-CM | POA: Diagnosis not present

## 2017-10-02 DIAGNOSIS — R600 Localized edema: Secondary | ICD-10-CM | POA: Diagnosis not present

## 2017-10-27 ENCOUNTER — Other Ambulatory Visit: Payer: Self-pay

## 2017-10-27 NOTE — Patient Outreach (Signed)
[facility]) Care Management  10/27/2017  Thomas [name] 09-26-1945 [id number]   Medication adherence call to [name] left a message for patient to call back patient is due on Glipizide 5 mg.[name] is showing past due under [facility].  [facility] Direct Dial [phone]  Fax [phone] [name]

## 2017-11-17 ENCOUNTER — Other Ambulatory Visit (INDEPENDENT_AMBULATORY_CARE_PROVIDER_SITE_OTHER): Payer: Self-pay | Admitting: Vascular Surgery

## 2017-11-17 ENCOUNTER — Ambulatory Visit (INDEPENDENT_AMBULATORY_CARE_PROVIDER_SITE_OTHER): Payer: Medicare Other

## 2017-11-17 ENCOUNTER — Encounter (INDEPENDENT_AMBULATORY_CARE_PROVIDER_SITE_OTHER): Payer: Self-pay | Admitting: Vascular Surgery

## 2017-11-17 ENCOUNTER — Ambulatory Visit (INDEPENDENT_AMBULATORY_CARE_PROVIDER_SITE_OTHER): Payer: Medicare Other | Admitting: Vascular Surgery

## 2017-11-17 VITALS — BP 178/83 | HR 63 | Resp 16 | Ht 66.0 in | Wt 164.0 lb

## 2017-11-17 DIAGNOSIS — N184 Chronic kidney disease, stage 4 (severe): Secondary | ICD-10-CM

## 2017-11-17 DIAGNOSIS — I739 Peripheral vascular disease, unspecified: Secondary | ICD-10-CM | POA: Diagnosis not present

## 2017-11-17 NOTE — Progress Notes (Signed)
Subjective:    Patient ID: Thomas [name], male    DOB: [birth year], 72 y.o.   MRN: [id number] Chief Complaint  Patient presents with  . Follow-up    ref Candiss Norse for Vein Mapping   The patient was last seen on 04/01/2007 and evaluation of peripheral artery disease.  The patient presents today at the request of Dr. Candiss Norse for upper extremity vein mapping.  The patient has been recently diagnosed with chronic renal disease stage IV.  The patient notes that he will be seeing Dr. Candiss Norse in the very near future and states that his renal function has gotten "better".  The patient denies any claudication-like symptoms, rest pain or ulcer formation to the bilateral lower extremity.  The patient denies any bilateral lower extremity edema.  The patient denies any uremic symptoms.  The patient underwent bilateral upper extremity vein mapping which was notable for venous anatomy too small for creation of anything but a left brachial basilic AV fistula.  The patient understands that this will require a two-stage surgery.  He also understands that the fistula will need at least 6 weeks to mature.  At this time, the patient does not want to move forward and schedule surgery until he follows up with Dr. Candiss Norse.  The patient feels that he will not need to have dialysis and does not want to undergo surgery if he does not need dialysis.  Once the patient follows up with Dr. Candiss Norse he will call our office and let us know what decision is made.  Patient denies any fever, nausea vomiting.  Review of Systems  Constitutional: Negative.   HENT: Negative.   Eyes: Negative.   Respiratory: Negative.   Cardiovascular: Negative.   Gastrointestinal: Negative.   Endocrine: Negative.   Genitourinary:       Chronic kidney disease stage IV  Musculoskeletal: Negative.   Skin: Negative.   Allergic/Immunologic: Negative.   Neurological: Negative.   Hematological: Negative.   Psychiatric/Behavioral: Negative.       Objective:   Physical Exam  Constitutional: He is oriented to person, place, and time. He appears well-developed and well-nourished. No distress.  HENT:  Head: Normocephalic and atraumatic.  Right Ear: External ear normal.  Left Ear: External ear normal.  Eyes: Pupils are equal, round, and reactive to light. Conjunctivae and EOM are normal.  Neck: Normal range of motion.  Cardiovascular: Normal rate, regular rhythm, normal heart sounds and intact distal pulses.  Pulses:      Radial pulses are 2+ on the right side, and 2+ on the left side.  Pulmonary/Chest: Effort normal and breath sounds normal.  Musculoskeletal: Normal range of motion. He exhibits no edema.  Neurological: He is alert and oriented to person, place, and time.  Skin: Skin is warm and dry. He is not diaphoretic.  Psychiatric: He has a normal mood and affect. His behavior is normal. Judgment and thought content normal.  Vitals reviewed.  BP (!) 178/83 (BP Location: Right Arm)   Pulse 63   Resp 16   Ht 5\' 6"  (1.676 m)   Wt 164 lb (74.4 kg)   BMI 26.47 kg/m   Past Medical History:  Diagnosis Date  . Allergy   . Arthritis   . CAD (coronary artery disease)   . Diabetes mellitus without complication ([place])   . Erosive esophagitis   . GERD (gastroesophageal reflux disease)   . Gouty arthropathy   . History of colonic polyps   . History of kidney stones   .  History of MRSA infection   . Hyperkalemia   . Hyperlipidemia   . Hypertension   . Melanoma ([place])   . Myocardial infarction ([place]) 1986  . Peripheral vascular disease ([place])   . 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 Trigger finger of left hand   . Ureteral tumor    Social History   Socioeconomic History  . Marital status: Married    Spouse name: Not on file  . Number of children: 2  . Years of education: Not on file  . Highest education level: Some college, no degree  Occupational History  . Occupation: Retired  Scientific laboratory technician  . Financial  resource strain: Not hard at all  . Food insecurity:    Worry: Never true    Inability: Never true  . Transportation needs:    Medical: No    Non-medical: No  Tobacco Use  . Smoking status: Former Smoker    Packs/day: 1.00    Years: 51.00    Pack years: 51.00    Types: Cigarettes    Last attempt to quit: 06/21/2017    Years since quitting: 0.4  . Smokeless tobacco: Never Used  . Tobacco comment: started smoking at age [age]. Smoked for 50 years  Substance and Sexual Activity  . Alcohol use: Yes    Alcohol/week: 0.0 oz    Frequency: Never    Comment: 1 every 6 months  . Drug use: No  . Sexual activity: Not on file  Lifestyle  . Physical activity:    Days per week: Not on file    Minutes per session: Not on file  . Stress: Not at all  Relationships  . Social connections:    Talks on phone: Not on file    Gets together: Not on file    Attends religious service: Not on file    Active member of club or organization: Not on file    Attends meetings of clubs or organizations: Not on file    Relationship status: Not on file  . Intimate partner violence:    Fear of current or ex partner: Not on file    Emotionally abused: Not on file    Physically abused: Not on file    Forced sexual activity: Not on file  Other Topics Concern  . Not on file  Social History Narrative  . Not on file   Past Surgical History:  Procedure Laterality Date  . CATARACT EXTRACTION    . CORONARY ARTERY BYPASS GRAFT  1996  . CORONARY STENT INTERVENTION N/A 05/29/2017   Procedure: CORONARY STENT INTERVENTION;  Surgeon: Isaias Cowman, MD;  Location: [facility];  Service: Cardiovascular;  Laterality: N/A;  . EXCISION Ureteral Tumor     (benign) Dr. Quillian Quince  . EYE SURGERY Bilateral    Cataract Extraction with IOL  . LEFT HEART CATH AND CORONARY ANGIOGRAPHY N/A 10/18/2016   Procedure: Left Heart Cath and Coronary Angiography;  Surgeon: Isaias Cowman, MD;  Location: [facility];  Service: Cardiovascular;  Laterality: N/A;  . LEFT HEART CATH AND CORONARY ANGIOGRAPHY N/A 05/29/2017   Procedure: LEFT HEART CATH AND CORONARY ANGIOGRAPHY;  Surgeon: Isaias Cowman, MD;  Location: [facility];  Service: Cardiovascular;  Laterality: N/A;  . LOWER EXTREMITY ANGIOGRAPHY Right 10/31/2016   Procedure: Lower Extremity Angiography;  Surgeon: Algernon Huxley, MD;  Location: [facility];  Service: Cardiovascular;  Laterality: Right;  . LOWER EXTREMITY ANGIOGRAPHY  Left 11/14/2016   Procedure: Lower Extremity Angiography;  Surgeon: Algernon Huxley, MD;  Location: [facility];  Service: Cardiovascular;  Laterality: Left;  . LOWER EXTREMITY INTERVENTION  11/14/2016   Procedure: Lower Extremity Intervention;  Surgeon: Algernon Huxley, MD;  Location: [facility];  Service: Cardiovascular;;  . RETINAL DETACHMENT SURGERY Right November 2107   Family History  Problem Relation Age of Onset  . Alzheimer's disease Mother   . Alzheimer's disease Brother   . Lung cancer Paternal Grandfather    Allergies  Allergen Reactions  . Ciprofloxacin Itching and Swelling    Facial swelling       Assessment & Plan:  The patient was last seen on 04/01/2007 and evaluation of peripheral artery disease.  The patient presents today at the request of Dr. Candiss Norse for upper extremity vein mapping.  The patient has been recently diagnosed with chronic renal disease stage IV.  The patient notes that he will be seeing Dr. Candiss Norse in the very near future and states that his renal function has gotten "better".  The patient denies any claudication-like symptoms, rest pain or ulcer formation to the bilateral lower extremity.  The patient denies any bilateral lower extremity edema.  The patient denies any uremic symptoms.  The patient underwent bilateral upper extremity vein mapping which was notable for venous anatomy too small for creation of anything but a left brachial basilic AV fistula.   The patient understands that this will require a two-stage surgery.  He also understands that the fistula will need at least 6 weeks to mature.  At this time, the patient does not want to move forward and schedule surgery until he follows up with Dr. Candiss Norse.  The patient feels that he will not need to have dialysis and does not want to undergo surgery if he does not need dialysis.  Once the patient follows up with Dr. Candiss Norse he will call our office and let us know what decision is made.  Patient denies any fever, nausea vomiting.  1. CKD (chronic kidney disease), stage IV (H[facility]) - New The patient's upper extremity anatomy is amenable to creation of a left brachiobasilic AV fistula. The patient understands that this is a two-stage procedure The patient understands that he will need 6 weeks for the fistula to mature At this time, the patient would like to meet with Dr. Candiss Norse.  He states that he has an upcoming appointment with him.  He notes that his kidney function has improved and does not want to move forward with surgery unless he knows for sure he will need dialysis. The patient will call our office if Dr. Candiss Norse and him decide he will need dialysis to make an appointment for surgery.  2. PAD (peripheral artery disease) ([place]) - Stable Patient presents asymptomatically Physical exam was unremarkable This is followed on a yearly basis  Current Outpatient Medications on File Prior to Visit  Medication Sig Dispense Refill  . acetaminophen (TYLENOL) 500 MG tablet Take 500 mg by mouth every 8 (eight) hours as needed for mild pain or headache.    Marland Kitchen aspirin EC 81 MG tablet Take 81 mg by mouth daily.    Marland Kitchen atorvastatin (LIPITOR) 80 MG tablet TAKE 1 TABLET BY MOUTH ONCE DAILY 30 tablet 5  . b complex vitamins capsule Take 1 capsule by mouth daily.    . clopidogrel (PLAVIX) 75 MG tablet Take 1 tablet (75 mg total) by mouth daily. 30 tablet 1  . furosemide (  LASIX) 40 MG tablet Take 1 tablet (40 mg total) by  mouth 2 (two) times daily. 60 tablet 0  . glipiZIDE (GLUCOTROL XL) 5 MG 24 hr tablet Take 1 tablet (5 mg total) by mouth daily with breakfast. 30 tablet 5  . isosorbide mononitrate (IMDUR) 60 MG 24 hr tablet Take 1 tablet (60 mg total) by mouth daily. 30 tablet 5  . metoprolol succinate (TOPROL-XL) 50 MG 24 hr tablet Take 1 tablet (50 mg total) by mouth daily. Take with or immediately following a meal. 30 tablet 5  . pantoprazole (PROTONIX) 40 MG tablet Take 1 tablet (40 mg total) by mouth daily. (Patient taking differently: Take 40 mg by mouth 2 (two) times daily. ) 30 tablet 12  . sodium bicarbonate 650 MG tablet Take 650 mg by mouth 2 (two) times daily.    . tamsulosin (FLOMAX) 0.4 MG CAPS capsule Take 1 capsule (0.4 mg total) by mouth daily after supper. 30 capsule 5  . vitamin B-12 (CYANOCOBALAMIN) 1000 MCG tablet Take 5,000 mcg by mouth daily.    Marland Kitchen buPROPion (WELLBUTRIN SR) 150 MG 12 hr tablet Take 1 tablet (150 mg total) by mouth 2 (two) times daily. (Patient not taking: Reported on 11/17/2017)    . colchicine 0.6 MG tablet Take 1 tablet (0.6 mg total) by mouth daily as needed (gout). (Patient not taking: Reported on 11/17/2017) 30 tablet 1  . diclofenac sodium (VOLTAREN) 1 % GEL Apply 2 g topically 4 (four) times daily as needed. (Patient not taking: Reported on 11/17/2017) 25 g 1  . Ferrous Sulfate (IRON) 325 (65 Fe) MG TABS Take 325 mg by mouth 2 (two) times daily.    . hydrALAZINE (APRESOLINE) 25 MG tablet Take 1 tablet (25 mg total) by mouth every 12 (twelve) hours. (Patient not taking: Reported on 11/17/2017) 60 tablet 5   No current facility-administered medications on file prior to visit.    There are no Patient Instructions on file for this visit. No follow-ups on file.  Aaren Atallah A Calista Crain, PA-C

## 2017-11-21 ENCOUNTER — Encounter (INDEPENDENT_AMBULATORY_CARE_PROVIDER_SITE_OTHER): Payer: Self-pay

## 2017-11-27 ENCOUNTER — Other Ambulatory Visit (INDEPENDENT_AMBULATORY_CARE_PROVIDER_SITE_OTHER): Payer: Self-pay | Admitting: Nurse Practitioner

## 2017-11-27 DIAGNOSIS — N184 Chronic kidney disease, stage 4 (severe): Secondary | ICD-10-CM | POA: Diagnosis not present

## 2017-11-27 DIAGNOSIS — E1129 Type 2 diabetes mellitus with other diabetic kidney complication: Secondary | ICD-10-CM | POA: Diagnosis not present

## 2017-11-27 DIAGNOSIS — E875 Hyperkalemia: Secondary | ICD-10-CM | POA: Diagnosis not present

## 2017-11-27 DIAGNOSIS — R808 Other proteinuria: Secondary | ICD-10-CM | POA: Diagnosis not present

## 2017-11-27 DIAGNOSIS — R600 Localized edema: Secondary | ICD-10-CM | POA: Diagnosis not present

## 2017-11-30 ENCOUNTER — Encounter
Admission: RE | Admit: 2017-11-30 | Discharge: 2017-11-30 | Disposition: A | Discharge status: Home / Self Care | Payer: Medicare Other | Source: Ambulatory Visit | Attending: Vascular Surgery | Admitting: Vascular Surgery

## 2017-11-30 ENCOUNTER — Encounter: Payer: Self-pay | Admitting: *Deleted

## 2017-11-30 ENCOUNTER — Other Ambulatory Visit: Payer: Self-pay

## 2017-11-30 ENCOUNTER — Telehealth: Payer: Self-pay | Admitting: Family Medicine

## 2017-11-30 DIAGNOSIS — N189 Chronic kidney disease, unspecified: Secondary | ICD-10-CM | POA: Diagnosis not present

## 2017-11-30 DIAGNOSIS — Z01810 Encounter for preprocedural cardiovascular examination: Secondary | ICD-10-CM | POA: Insufficient documentation

## 2017-11-30 DIAGNOSIS — Z01812 Encounter for preprocedural laboratory examination: Secondary | ICD-10-CM | POA: Diagnosis not present

## 2017-11-30 LAB — CBC WITH DIFFERENTIAL/PLATELET
Basophils Absolute: 0.1 10*3/uL (ref 0–0.1)
Basophils Relative: 1 %
EOS PCT: 7 %
Eosinophils Absolute: 0.8 10*3/uL — ABNORMAL HIGH (ref 0–0.7)
HEMATOCRIT: 30.1 % — AB (ref 40.0–52.0)
Hemoglobin: 10.1 g/dL — ABNORMAL LOW (ref 13.0–18.0)
LYMPHS ABS: 1.1 10*3/uL (ref 1.0–3.6)
LYMPHS PCT: 10 %
MCH: 29.3 pg (ref 26.0–34.0)
MCHC: 33.5 g/dL (ref 32.0–36.0)
MCV: 87.3 fL (ref 80.0–100.0)
MONO ABS: 0.8 10*3/uL (ref 0.2–1.0)
MONOS PCT: 7 %
NEUTROS ABS: 8.5 10*3/uL — AB (ref 1.4–6.5)
Neutrophils Relative %: 75 %
PLATELETS: 369 10*3/uL (ref 150–440)
RBC: 3.45 MIL/uL — ABNORMAL LOW (ref 4.40–5.90)
RDW: 16.4 % — AB (ref 11.5–14.5)
WBC: 11.3 10*3/uL — ABNORMAL HIGH (ref 3.8–10.6)

## 2017-11-30 LAB — BASIC METABOLIC PANEL
ANION GAP: 10 (ref 5–15)
BUN: 42 mg/dL — AB (ref 8–23)
CO2: 23 mmol/L (ref 22–32)
Calcium: 8.8 mg/dL — ABNORMAL LOW (ref 8.9–10.3)
Chloride: 103 mmol/L (ref 98–111)
Creatinine, Ser: 3.82 mg/dL — ABNORMAL HIGH (ref 0.61–1.24)
GFR calc Af Amer: 17 mL/min — ABNORMAL LOW (ref >60)
GFR calc non Af Amer: 14 mL/min — ABNORMAL LOW (ref >60)
GLUCOSE: 354 mg/dL — AB (ref 70–99)
POTASSIUM: 4.5 mmol/L (ref 3.5–5.1)
Sodium: 136 mmol/L (ref 135–145)

## 2017-11-30 LAB — TYPE AND SCREEN
ABO/RH(D): A NEG
Antibody Screen: NEGATIVE

## 2017-11-30 LAB — APTT: APTT: 38 s — AB (ref 24–36)

## 2017-11-30 LAB — PROTIME-INR
INR: 1.02
Prothrombin Time: 13.3 seconds (ref 11.4–15.2)

## 2017-11-30 NOTE — Pre-Procedure Instructions (Signed)
AS INSTRUCTED BY DR P CARROLL, REQUEST FOR CARDIAC CLEARANCE/EKG CALLED AND FAXED TO [facility] AT DR PARASCHOS. FYI TO DR Lucky Cowboy

## 2017-11-30 NOTE — Telephone Encounter (Signed)
Santiago Glad with pre admit at [facility] is faxing you a medical clearance for pt.  His Blood glucose was 354 today at his pre admit visit.  He is having surgery on the 22nd with Dr. Lucky Cowboy  CB# is [phone] pre admit testing  Thanks teri

## 2017-11-30 NOTE — Patient Instructions (Signed)
Your procedure is scheduled on: 12/07/17 Thurs Report to Same Day Surgery 2nd floor medical mall [facility] Entrance-take elevator on left to 2nd floor.  Check in with surgery information desk.) To find out your arrival time please call [phone] between 1PM - 3PM on 12/06/17 Wed  Remember: Instructions that are not followed completely may result in serious medical risk, up to and including death, or upon the discretion of your surgeon and anesthesiologist your surgery may need to be rescheduled.    _x___ 1. Do not eat food after midnight the night before your procedure. You may drink clear liquids up to 2 hours before you are scheduled to arrive at the hospital for your procedure.  Do not drink clear liquids within 2 hours of your scheduled arrival to the hospital.  Clear liquids include  --Water or Apple juice without pulp  --Clear carbohydrate beverage such as ClearFast or Gatorade  --Black Coffee or Clear Tea (No milk, no creamers, do not add anything to                  the coffee or Tea Type 1 and type 2 diabetics should only drink water.   ____Ensure clear carbohydrate drink on the way to the hospital for bariatric patients  ____Ensure clear carbohydrate drink 3 hours before surgery for Dr Dwyane Luo patients if physician instructed.   No gum chewing or hard candies.     __x__ 2. No Alcohol for 24 hours before or after surgery.   __x__3. No Smoking or e-cigarettes for 24 prior to surgery.  Do not use any chewable tobacco products for at least 6 hour prior to surgery   ____  4. Bring all medications with you on the day of surgery if instructed.    __x__ 5. Notify your doctor if there is any change in your medical condition     (cold, fever, infections).    x___6. On the morning of surgery brush your teeth with toothpaste and water.  You may rinse your mouth with mouth wash if you wish.  Do not swallow any toothpaste or mouthwash.   Do not wear jewelry, make-up, hairpins,  clips or nail polish.  Do not wear lotions, powders, or perfumes. You may wear deodorant.  Do not shave 48 hours prior to surgery. Men may shave face and neck.  Do not bring valuables to the hospital.    [facility] is not responsible for any belongings or valuables.               Contacts, dentures or bridgework may not be worn into surgery.  Leave your suitcase in the car. After surgery it may be brought to your room.  For patients admitted to the hospital, discharge time is determined by your                       treatment team.  _  Patients discharged the day of surgery will not be allowed to drive home.  You will need someone to drive you home and stay with you the night of your procedure.    Please read over the following fact sheets that you were given:   [facility] Preparing for Surgery and or MRSA Information   _x___ Take anti-hypertensive listed below, cardiac, seizure, asthma,     anti-reflux and psychiatric medicines. These include:  1. isosorbide mononitrate (IMDUR) 60 MG 24 hr tablet  2.buPROPion (WELLBUTRIN SR) 150 MG 12 hr tablet  3.metoprolol  succinate (TOPROL-XL) 50 MG 24 hr tablet  4.pantoprazole (PROTONIX) 40 MG tablet  5.  6.  ____Fleets enema or Magnesium Citrate as directed.   _x___ Use CHG Soap or sage wipes as directed on instruction sheet   ____ Use inhalers on the day of surgery and bring to hospital day of surgery  ____ Stop Metformin and Janumet 2 days prior to surgery.    ____ Take 1/2 of usual insulin dose the night before surgery and none on the morning     surgery.   _x___ Follow recommendations from Cardiologist, Pulmonologist or PCP regarding          stopping Aspirin, Coumadin, Plavix ,Eliquis, Effient, or Pradaxa, and Pletal.  Stop Plavix 5 days before surgery if OK with MD  X____Stop Anti-inflammatories such as Advil, Aleve, Ibuprofen, Motrin, Naproxen, Naprosyn, Goodies powders or aspirin products. OK to take Tylenol and                           Celebrex.   _x___ Stop supplements until after surgery.  But may continue Vitamin D, Vitamin B,       and multivitamin.   ____ Bring C-Pap to the hospital.

## 2017-11-30 NOTE — Pre-Procedure Instructions (Signed)
Medical clearance requested, Dr Caryn Section and Dr Lucky Cowboy notified by phone and fax.

## 2017-12-01 ENCOUNTER — Telehealth: Payer: Self-pay | Admitting: Family Medicine

## 2017-12-01 NOTE — Telephone Encounter (Signed)
Received medical clearance form  from preadmit testing, placed form in providers box. Thanks CC

## 2017-12-04 DIAGNOSIS — N185 Chronic kidney disease, stage 5: Secondary | ICD-10-CM | POA: Diagnosis not present

## 2017-12-04 DIAGNOSIS — R809 Proteinuria, unspecified: Secondary | ICD-10-CM | POA: Diagnosis not present

## 2017-12-04 DIAGNOSIS — I10 Essential (primary) hypertension: Secondary | ICD-10-CM | POA: Diagnosis not present

## 2017-12-04 DIAGNOSIS — E1129 Type 2 diabetes mellitus with other diabetic kidney complication: Secondary | ICD-10-CM | POA: Diagnosis not present

## 2017-12-04 DIAGNOSIS — R600 Localized edema: Secondary | ICD-10-CM | POA: Diagnosis not present

## 2017-12-04 NOTE — Telephone Encounter (Signed)
LMOVM for pt to return call 

## 2017-12-04 NOTE — Telephone Encounter (Signed)
Patient needs to increase glipizide to 10mg  every morning, #30. rf x 0. Can take an extra 5mg  today to make 10mg  total. Come in on Wednesday to check blood sugar.

## 2017-12-04 NOTE — Telephone Encounter (Signed)
Form received and placed on Dr. Maralyn Sago desk.

## 2017-12-04 NOTE — Pre-Procedure Instructions (Signed)
CLEARED BY DR PARASCHOS LOW RISK ON CHART

## 2017-12-05 MED ORDER — GLIPIZIDE ER 10 MG PO TB24: 10.0000 mg | ORAL_TABLET | Freq: Every day | ORAL | 0 refills | Status: DC

## 2017-12-05 NOTE — Telephone Encounter (Signed)
Patient was advised. Rx sent to pharmacy.  

## 2017-12-05 NOTE — Telephone Encounter (Signed)
Clearance was completed. I don't know if it has been faxed.

## 2017-12-05 NOTE — Telephone Encounter (Signed)
Please advise 

## 2017-12-05 NOTE — Pre-Procedure Instructions (Signed)
[place] , ON CHART

## 2017-12-05 NOTE — Telephone Encounter (Signed)
Sherry with anesthesia is requesting call back for an update on the clearance form because pt is scheduled for surgery on 12/07/17. Judeen Hammans is asking if pt is going to need an OV with Dr. Caryn Section or if Dr. Caryn Section plans on signing form without an OV. Please advise. Thanks TNP

## 2017-12-06 MED ORDER — CEFAZOLIN SODIUM-DEXTROSE 1-4 GM/50ML-% IV SOLN
1.0000 g | Freq: Once | INTRAVENOUS | Status: AC
  Administered 2017-12-07: 1 g via INTRAVENOUS

## 2017-12-06 NOTE — Telephone Encounter (Signed)
Per Chrys Racer clearance was faxed yesterday morning.

## 2017-12-07 ENCOUNTER — Ambulatory Visit: Payer: Medicare Other | Admitting: Anesthesiology

## 2017-12-07 ENCOUNTER — Ambulatory Visit
Admission: RE | Admit: 2017-12-07 | Discharge: 2017-12-07 | Disposition: A | Discharge status: Home / Self Care | Payer: Medicare Other | Source: Ambulatory Visit | Attending: Vascular Surgery | Admitting: Vascular Surgery

## 2017-12-07 ENCOUNTER — Other Ambulatory Visit: Payer: Self-pay

## 2017-12-07 ENCOUNTER — Encounter
Admission: RE | Disposition: A | Discharge status: Home / Self Care | Payer: Self-pay | Source: Ambulatory Visit | Attending: Vascular Surgery

## 2017-12-07 DIAGNOSIS — N184 Chronic kidney disease, stage 4 (severe): Secondary | ICD-10-CM

## 2017-12-07 DIAGNOSIS — K219 Gastro-esophageal reflux disease without esophagitis: Secondary | ICD-10-CM | POA: Diagnosis not present

## 2017-12-07 DIAGNOSIS — I509 Heart failure, unspecified: Secondary | ICD-10-CM | POA: Insufficient documentation

## 2017-12-07 DIAGNOSIS — Z7902 Long term (current) use of antithrombotics/antiplatelets: Secondary | ICD-10-CM | POA: Diagnosis not present

## 2017-12-07 DIAGNOSIS — Z85820 Personal history of malignant melanoma of skin: Secondary | ICD-10-CM | POA: Diagnosis not present

## 2017-12-07 DIAGNOSIS — I2510 Atherosclerotic heart disease of native coronary artery without angina pectoris: Secondary | ICD-10-CM | POA: Insufficient documentation

## 2017-12-07 DIAGNOSIS — Z7984 Long term (current) use of oral hypoglycemic drugs: Secondary | ICD-10-CM | POA: Insufficient documentation

## 2017-12-07 DIAGNOSIS — Z87891 Personal history of nicotine dependence: Secondary | ICD-10-CM | POA: Diagnosis not present

## 2017-12-07 DIAGNOSIS — E785 Hyperlipidemia, unspecified: Secondary | ICD-10-CM | POA: Diagnosis not present

## 2017-12-07 DIAGNOSIS — Z7982 Long term (current) use of aspirin: Secondary | ICD-10-CM | POA: Insufficient documentation

## 2017-12-07 DIAGNOSIS — Z8614 Personal history of Methicillin resistant Staphylococcus aureus infection: Secondary | ICD-10-CM | POA: Insufficient documentation

## 2017-12-07 DIAGNOSIS — I130 Hypertensive heart and chronic kidney disease with heart failure and stage 1 through stage 4 chronic kidney disease, or unspecified chronic kidney disease: Secondary | ICD-10-CM | POA: Diagnosis not present

## 2017-12-07 DIAGNOSIS — Z7951 Long term (current) use of inhaled steroids: Secondary | ICD-10-CM | POA: Diagnosis not present

## 2017-12-07 DIAGNOSIS — E1122 Type 2 diabetes mellitus with diabetic chronic kidney disease: Secondary | ICD-10-CM | POA: Diagnosis not present

## 2017-12-07 DIAGNOSIS — J439 Emphysema, unspecified: Secondary | ICD-10-CM | POA: Diagnosis not present

## 2017-12-07 DIAGNOSIS — Z79899 Other long term (current) drug therapy: Secondary | ICD-10-CM | POA: Diagnosis not present

## 2017-12-07 DIAGNOSIS — Z85828 Personal history of other malignant neoplasm of skin: Secondary | ICD-10-CM | POA: Diagnosis not present

## 2017-12-07 DIAGNOSIS — E1151 Type 2 diabetes mellitus with diabetic peripheral angiopathy without gangrene: Secondary | ICD-10-CM | POA: Diagnosis not present

## 2017-12-07 DIAGNOSIS — I5023 Acute on chronic systolic (congestive) heart failure: Secondary | ICD-10-CM | POA: Diagnosis not present

## 2017-12-07 HISTORY — PX: AV FISTULA PLACEMENT: SHX1204

## 2017-12-07 HISTORY — DX: Chronic kidney disease, unspecified: N18.9

## 2017-12-07 HISTORY — DX: Dyspnea, unspecified: R06.00

## 2017-12-07 LAB — GLUCOSE, CAPILLARY
Glucose-Capillary: 190 mg/dL — ABNORMAL HIGH (ref 70–99)
Glucose-Capillary: 182 mg/dL — ABNORMAL HIGH (ref 70–99)

## 2017-12-07 SURGERY — ARTERIOVENOUS (AV) FISTULA CREATION
Anesthesia: General | Site: Arm Upper | Laterality: Left | Wound class: Clean

## 2017-12-07 MED ORDER — PAPAVERINE HCL 30 MG/ML IJ SOLN
INTRAMUSCULAR | Status: AC
  Filled 2017-12-07: qty 2

## 2017-12-07 MED ORDER — DEXAMETHASONE SODIUM PHOSPHATE 10 MG/ML IJ SOLN
INTRAMUSCULAR | Status: DC | PRN
  Administered 2017-12-07: 10 mg via INTRAVENOUS

## 2017-12-07 MED ORDER — ONDANSETRON HCL 4 MG/2ML IJ SOLN
INTRAMUSCULAR | Status: DC | PRN
  Administered 2017-12-07: 4 mg via INTRAVENOUS

## 2017-12-07 MED ORDER — ESMOLOL HCL 100 MG/10ML IV SOLN
INTRAVENOUS | Status: DC | PRN
  Administered 2017-12-07: 10 mg via INTRAVENOUS

## 2017-12-07 MED ORDER — EVICEL 2 ML EX KIT
PACK | CUTANEOUS | Status: DC | PRN
  Administered 2017-12-07: 2 mL

## 2017-12-07 MED ORDER — EVICEL 2 ML EX KIT
PACK | CUTANEOUS | Status: AC
  Filled 2017-12-07: qty 1

## 2017-12-07 MED ORDER — HEPARIN SODIUM (PORCINE) 5000 UNIT/ML IJ SOLN
INTRAMUSCULAR | Status: AC
  Filled 2017-12-07: qty 1

## 2017-12-07 MED ORDER — HEPARIN SODIUM (PORCINE) 1000 UNIT/ML IJ SOLN
INTRAMUSCULAR | Status: DC | PRN
  Administered 2017-12-07: 3000 [IU] via INTRAVENOUS

## 2017-12-07 MED ORDER — SEVOFLURANE IN SOLN
RESPIRATORY_TRACT | Status: AC
  Filled 2017-12-07: qty 250

## 2017-12-07 MED ORDER — CLOPIDOGREL BISULFATE 75 MG PO TABS: 150.0000 mg | ORAL_TABLET | Freq: Once | ORAL | Status: DC

## 2017-12-07 MED ORDER — EPHEDRINE SULFATE 50 MG/ML IJ SOLN
INTRAMUSCULAR | Status: DC | PRN
  Administered 2017-12-07 (×2): 5 mg via INTRAVENOUS
  Administered 2017-12-07: 10 mg via INTRAVENOUS

## 2017-12-07 MED ORDER — ESMOLOL HCL 100 MG/10ML IV SOLN
INTRAVENOUS | Status: AC
  Filled 2017-12-07: qty 10

## 2017-12-07 MED ORDER — PROPOFOL 10 MG/ML IV BOLUS
INTRAVENOUS | Status: AC
  Filled 2017-12-07: qty 20

## 2017-12-07 MED ORDER — LIDOCAINE HCL (CARDIAC) PF 100 MG/5ML IV SOSY
PREFILLED_SYRINGE | INTRAVENOUS | Status: DC | PRN
  Administered 2017-12-07: 80 mg via INTRAVENOUS

## 2017-12-07 MED ORDER — FENTANYL CITRATE (PF) 100 MCG/2ML IJ SOLN: 25.0000 ug | INTRAMUSCULAR | Status: DC | PRN

## 2017-12-07 MED ORDER — PROPOFOL 10 MG/ML IV BOLUS
INTRAVENOUS | Status: DC | PRN
  Administered 2017-12-07: 50 mg via INTRAVENOUS

## 2017-12-07 MED ORDER — SUGAMMADEX SODIUM 200 MG/2ML IV SOLN
INTRAVENOUS | Status: DC | PRN
  Administered 2017-12-07: 145.2 mg via INTRAVENOUS

## 2017-12-07 MED ORDER — CLOPIDOGREL BISULFATE 75 MG PO TABS
150.0000 mg | ORAL_TABLET | Freq: Once | ORAL | Status: AC
  Administered 2017-12-07: 150 mg via ORAL
  Filled 2017-12-07: qty 2

## 2017-12-07 MED ORDER — FENTANYL CITRATE (PF) 100 MCG/2ML IJ SOLN
INTRAMUSCULAR | Status: AC
  Filled 2017-12-07: qty 2

## 2017-12-07 MED ORDER — SODIUM CHLORIDE 0.9 % IV SOLN
INTRAVENOUS | Status: DC
  Administered 2017-12-07: 14:00:00 via INTRAVENOUS

## 2017-12-07 MED ORDER — ONDANSETRON HCL 4 MG/2ML IJ SOLN
INTRAMUSCULAR | Status: AC
  Filled 2017-12-07: qty 2

## 2017-12-07 MED ORDER — ROCURONIUM BROMIDE 100 MG/10ML IV SOLN
INTRAVENOUS | Status: DC | PRN
  Administered 2017-12-07: 40 mg via INTRAVENOUS

## 2017-12-07 MED ORDER — FENTANYL CITRATE (PF) 100 MCG/2ML IJ SOLN
INTRAMUSCULAR | Status: DC | PRN
  Administered 2017-12-07 (×2): 25 ug via INTRAVENOUS
  Administered 2017-12-07: 50 ug via INTRAVENOUS

## 2017-12-07 MED ORDER — TRAMADOL HCL 50 MG PO TABS: 50.0000 mg | ORAL_TABLET | Freq: Four times a day (QID) | ORAL | 0 refills | Status: DC | PRN

## 2017-12-07 MED ORDER — MIDAZOLAM HCL 2 MG/2ML IJ SOLN
INTRAMUSCULAR | Status: AC
  Filled 2017-12-07: qty 2

## 2017-12-07 MED ORDER — BUPIVACAINE-EPINEPHRINE (PF) 0.5% -1:200000 IJ SOLN
INTRAMUSCULAR | Status: DC | PRN
  Administered 2017-12-07: 10 mL

## 2017-12-07 MED ORDER — BUPIVACAINE-EPINEPHRINE (PF) 0.5% -1:200000 IJ SOLN
INTRAMUSCULAR | Status: AC
  Filled 2017-12-07: qty 30

## 2017-12-07 MED ORDER — CEFAZOLIN SODIUM-DEXTROSE 1-4 GM/50ML-% IV SOLN
INTRAVENOUS | Status: AC
  Filled 2017-12-07: qty 50

## 2017-12-07 MED ORDER — ONDANSETRON HCL 4 MG/2ML IJ SOLN
4.0000 mg | Freq: Once | INTRAMUSCULAR | Status: AC
  Administered 2017-12-07: 4 mg via INTRAVENOUS

## 2017-12-07 SURGICAL SUPPLY — 51 items
BAG DECANTER FOR FLEXI CONT (MISCELLANEOUS) ×3 IMPLANT
BLADE SURG SZ11 CARB STEEL (BLADE) ×3 IMPLANT
BOOT SUTURE AID YELLOW STND (SUTURE) ×3 IMPLANT
BRUSH SCRUB EZ  4% CHG (MISCELLANEOUS) ×2
BRUSH SCRUB EZ 4% CHG (MISCELLANEOUS) ×1 IMPLANT
CANISTER SUCT 1200ML W/VALVE (MISCELLANEOUS) ×3 IMPLANT
CHLORAPREP W/TINT 26ML (MISCELLANEOUS) ×6 IMPLANT
CLIP SPRNG 6MM S-JAW DBL (CLIP) ×3
DERMABOND ADVANCED (GAUZE/BANDAGES/DRESSINGS) ×2
DERMABOND ADVANCED .7 DNX12 (GAUZE/BANDAGES/DRESSINGS) ×1 IMPLANT
ELECT CAUTERY BLADE 6.4 (BLADE) ×3 IMPLANT
ELECT REM PT RETURN 9FT ADLT (ELECTROSURGICAL) ×3
ELECTRODE REM PT RTRN 9FT ADLT (ELECTROSURGICAL) ×1 IMPLANT
GEL ULTRASOUND 20GR AQUASONIC (MISCELLANEOUS) IMPLANT
GLOVE BIO SURGEON STRL SZ7 (GLOVE) ×6 IMPLANT
GLOVE INDICATOR 7.5 STRL GRN (GLOVE) ×3 IMPLANT
GOWN STRL REUS W/ TWL LRG LVL3 (GOWN DISPOSABLE) ×1 IMPLANT
GOWN STRL REUS W/ TWL XL LVL3 (GOWN DISPOSABLE) ×2 IMPLANT
GOWN STRL REUS W/TWL LRG LVL3 (GOWN DISPOSABLE) ×2
GOWN STRL REUS W/TWL XL LVL3 (GOWN DISPOSABLE) ×4
HEMOSTAT SURGICEL 2X3 (HEMOSTASIS) ×3 IMPLANT
IV NS 500ML (IV SOLUTION) ×2
IV NS 500ML BAXH (IV SOLUTION) ×1 IMPLANT
KIT TURNOVER KIT A (KITS) ×3 IMPLANT
LABEL OR SOLS (LABEL) ×3 IMPLANT
LOOP RED MAXI  1X406MM (MISCELLANEOUS) ×2
LOOP VESSEL MAXI 1X406 RED (MISCELLANEOUS) ×1 IMPLANT
LOOP VESSEL MINI 0.8X406 BLUE (MISCELLANEOUS) ×1 IMPLANT
LOOPS BLUE MINI 0.8X406MM (MISCELLANEOUS) ×2
NEEDLE FILTER BLUNT 18X 1/2SAF (NEEDLE) ×2
NEEDLE FILTER BLUNT 18X1 1/2 (NEEDLE) ×1 IMPLANT
NEEDLE HYPO 30X.5 LL (NEEDLE) IMPLANT
NS IRRIG 500ML POUR BTL (IV SOLUTION) ×3 IMPLANT
PACK EXTREMITY ARMC (MISCELLANEOUS) ×3 IMPLANT
PAD PREP 24X41 OB/GYN DISP (PERSONAL CARE ITEMS) ×3 IMPLANT
SOLUTION CELL SAVER (CLIP) ×1 IMPLANT
STOCKINETTE STRL 4IN 9604848 (GAUZE/BANDAGES/DRESSINGS) ×3 IMPLANT
SUT MNCRL AB 4-0 PS2 18 (SUTURE) ×3 IMPLANT
SUT PROLENE 6 0 BV (SUTURE) ×9 IMPLANT
SUT SILK 2 0 (SUTURE) ×2
SUT SILK 2-0 18XBRD TIE 12 (SUTURE) ×1 IMPLANT
SUT SILK 3 0 (SUTURE) ×2
SUT SILK 3-0 18XBRD TIE 12 (SUTURE) ×1 IMPLANT
SUT SILK 4 0 (SUTURE) ×2
SUT SILK 4-0 18XBRD TIE 12 (SUTURE) ×1 IMPLANT
SUT VIC AB 3-0 SH 27 (SUTURE) ×2
SUT VIC AB 3-0 SH 27X BRD (SUTURE) ×1 IMPLANT
SYR 20CC LL (SYRINGE) ×3 IMPLANT
SYR 3ML LL SCALE MARK (SYRINGE) ×3 IMPLANT
SYR TB 1ML 27GX1/2 LL (SYRINGE) IMPLANT
TOWEL OR 17X26 4PK STRL BLUE (TOWEL DISPOSABLE) IMPLANT

## 2017-12-07 NOTE — Anesthesia Post-op Follow-up Note (Signed)
Anesthesia QCDR form completed.        

## 2017-12-07 NOTE — Transfer of Care (Signed)
Immediate Anesthesia Transfer of Care Note  Patient: Thomas [name]  Procedure(s) Performed: ARTERIOVENOUS (AV) FISTULA CREATION ( BRACHIOBASILIC STAGE ) (Left Arm Upper)  Patient Location: PACU  Anesthesia Type:General  Level of Consciousness: sedated  Airway & Oxygen Therapy: Patient Spontanous Breathing and Patient connected to face mask oxygen  Post-op Assessment: Report given to RN and Post -op Vital signs reviewed and stable  Post vital signs: Reviewed and stable  Last Vitals:  Vitals Value Taken Time  BP 155/74 12/07/2017  4:12 PM  Temp    Pulse 75 12/07/2017  4:13 PM  Resp 14 12/07/2017  4:13 PM  SpO2 100 % 12/07/2017  4:13 PM  Vitals shown include unvalidated device data.  Last Pain:  Vitals:   12/07/17 1317  TempSrc: Temporal  PainSc: 0-No pain         Complications: No apparent anesthesia complications

## 2017-12-07 NOTE — Op Note (Signed)
[facility]   OPERATIVE NOTE   PROCEDURE: Left brachiocephalic arteriovenous fistula placement  PRE-OPERATIVE DIAGNOSIS: 1.  CKD stage 4 2. PAD 3. CAD  POST-OPERATIVE DIAGNOSIS: 1. Same as above  SURGEON: Leotis Pain, MD  ASSISTANT(S): none  ANESTHESIA: general  ESTIMATED BLOOD LOSS: 10 cc  FINDING(S): Adequate cephalic vein for fistula creation  SPECIMEN(S):  none  INDICATIONS:   Thomas [name] is a 72 y.o. male who presents with renal failure in need of pemanent dialysis acces.  The patient is scheduled for left arm AVF placement.  The patient is aware the risks include but are not limited to: bleeding, infection, steal syndrome, nerve damage, ischemic monomelic neuropathy, failure to mature, and need for additional procedures.  The patient is aware of the risks of the procedure and elects to proceed forward.  DESCRIPTION: After full informed written consent was obtained from the patient, the patient was brought back to the operating room and placed supine upon the operating table.  Prior to induction, the patient received IV antibiotics.   After obtaining adequate anesthesia, the patient was then prepped and draped in the standard fashion for a left arm access procedure.  I made a curvilinear incision at the level of the antecubital fossa and dissected through the subcutaneous tissue and fascia to gain exposure of the brachial artery.  This was noted to be patent and adequate in size for fistula creation.  This was dissected out proximally and distally and prepared for control with vessel loops .  I then dissected out the cephalic vein.  Although vein mapping had suggested this would not be of adequate size, this was actually a nice soft vessel.  This was noted to be patent and adequate in size for fistula creation.  I then gave the patient 3000 units of intravenous heparin.  The vein was marked for orientation and the distal segment of the vein was ligated  with a  2-0 silk, and the vein was transected.  I then instilled the heparinized saline into the vein and clamped it.  At this point, I reset my exposure of the brachial artery and pulled up control on the vessel loops.  I made an arteriotomy with a #11 blade, and then I extended the arteriotomy with a Potts scissor.  I injected heparinized saline proximal and distal to this arteriotomy.  The vein was then sewn to the artery in an end-to-side configuration with a running stitch of 6-0 Prolene.  Prior to completing this anastomosis, I allowed the vein and artery to backbleed.  There was no evidence of clot from any vessels.  I completed the anastomosis in the usual fashion and then released all vessel loops and clamps.  There was a palpable  thrill in the venous outflow, and there was a palpable pulse in the artery distal to the anastomosis.  At this point, I irrigated out the surgical wound.  Surgicel was placed. There was no further active bleeding.  The subcutaneous tissue was reapproximated with a running stitch of 3-0 Vicryl.  The skin was then closed with a 4-0 Monocryl suture.  The skin was then cleaned, dried, and reinforced with Dermabond.  The patient tolerated this procedure well and was taken to the recovery room in stable condition  COMPLICATIONS: None  CONDITION: Stable   Leotis Pain    12/07/2017, 4:34 PM  This note was created with Dragon Medical transcription system. Any errors in dictation are purely unintentional.

## 2017-12-07 NOTE — Anesthesia Procedure Notes (Signed)
Procedure Name: Intubation Date/Time: 12/07/2017 3:00 PM Performed by: Nelda Marseille, CRNA Pre-anesthesia Checklist: Patient identified, Patient being monitored, Timeout performed, Emergency Drugs available and Suction available Patient Re-evaluated:Patient Re-evaluated prior to induction Oxygen Delivery Method: Circle System Utilized Preoxygenation: Pre-oxygenation with 100% oxygen Induction Type: IV induction Ventilation: Mask ventilation without difficulty Laryngoscope Size: Mac and 3 Grade View: Grade I Tube type: Oral Tube size: 7.0 mm Number of attempts: 1 Airway Equipment and Method: Stylet Placement Confirmation: ETT inserted through vocal cords under direct vision,  positive ETCO2 and breath sounds checked- equal and bilateral Secured at: 21 cm Tube secured with: Tape Dental Injury: Teeth and Oropharynx as per pre-operative assessment

## 2017-12-07 NOTE — H&P (Signed)
[facility] History & Physical Update  The patient was interviewed and re-examined.  The patient's previous History and Physical has been reviewed and is unchanged.  There is no change in the plan of care. We plan to proceed with the scheduled procedure.  Leotis Pain, MD  12/07/2017, 1:45 PM

## 2017-12-07 NOTE — Anesthesia Preprocedure Evaluation (Addendum)
Anesthesia Evaluation  Patient identified by MRN, date of birth, ID band Patient awake    Reviewed: Allergy & Precautions, H&P , NPO status , Patient's Chart, lab work & pertinent test results  Airway Mallampati: III  TM Distance: >3 FB Neck ROM: full    Dental  (+) Upper Dentures, Lower Dentures   Pulmonary neg pulmonary ROS, shortness of breath (SOB improved since stents placed in February), neg COPD, neg recent URI, former smoker (quit April 2019),    breath sounds clear to auscultation       Cardiovascular hypertension, (-) angina+ CAD, + Past MI, + Cardiac Stents (February 2019), + Peripheral Vascular Disease and +CHF  negative cardio ROS   Rhythm:Regular Rate:Normal     Neuro/Psych negative neurological ROS  negative psych ROS   GI/Hepatic negative GI ROS, Neg liver ROS, PUD, GERD  ,  Endo/Other  negative endocrine ROSdiabetes, Type 2  Renal/GU Renal disease     Musculoskeletal  (+) Arthritis ,   Abdominal   Peds  Hematology negative hematology ROS (+)   Anesthesia Other Findings Past Medical History: No date: Allergy No date: Arthritis No date: CAD (coronary artery disease) No date: Chronic kidney disease No date: Diabetes mellitus without complication (HCC) No date: Dyspnea No date: Erosive esophagitis No date: GERD (gastroesophageal reflux disease) No date: Gouty arthropathy No date: History of colonic polyps No date: History of kidney stones No date: History of MRSA infection No date: Hyperkalemia No date: Hyperlipidemia No date: Hypertension No date: Melanoma ([place]) 1986: Myocardial infarction ([place]) No date: Peripheral vascular disease ([place]) No 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 Quillian Quince No date: EYE SURGERY; Bilateral     Comment:  Cataract Extraction with IOL 10/18/2016: LEFT HEART CATH AND CORONARY ANGIOGRAPHY; N/A     Comment:  Procedure: Left Heart Cath and Coronary Angiography;                Surgeon: Isaias Cowman, MD;  Location: [facility];  Service: Cardiovascular;  Laterality:               N/A; 05/29/2017: LEFT HEART CATH AND CORONARY ANGIOGRAPHY; N/A     Comment:  Procedure: LEFT HEART CATH AND CORONARY ANGIOGRAPHY;                Surgeon: Isaias Cowman, MD;  Location: [facility];  Service: Cardiovascular;  Laterality:               N/A; 10/31/2016: LOWER EXTREMITY ANGIOGRAPHY; Right     Comment:  Procedure: Lower Extremity Angiography;  Surgeon: Algernon Huxley, MD;  Location: [place]  [facility];  Service:               Cardiovascular;  Laterality: Right; 11/14/2016: LOWER EXTREMITY ANGIOGRAPHY; Left     Comment:  Procedure: Lower Extremity Angiography;  Surgeon: Algernon Huxley, MD;  Location: [facility] [facility];  Service:               Cardiovascular;  Laterality: Left; 11/14/2016: LOWER EXTREMITY INTERVENTION     Comment:  Procedure: Lower Extremity Intervention;  Surgeon: Algernon Huxley, MD;  Location: [place] [facility];  Service:               Cardiovascular;; November 2107: RETINAL DETACHMENT SURGERY; Right  BMI    Body Mass Index:  25.82 kg/m      Reproductive/Obstetrics negative OB ROS                            Anesthesia Physical Anesthesia Plan  ASA: III  Anesthesia Plan: General   Post-op Pain Management:    Induction:  Intravenous  PONV Risk Score and Plan: Ondansetron and Dexamethasone  Airway Management Planned: Oral ETT  Additional Equipment:   Intra-op Plan:   Post-operative Plan: Extubation in OR  Informed Consent: I have reviewed the patients History and Physical, chart, labs and discussed the procedure including the risks, benefits and alternatives for the proposed anesthesia with the patient or authorized representative who has indicated his/her understanding and acceptance.   Dental Advisory Given  Plan Discussed with: Anesthesiologist, CRNA and Surgeon  Anesthesia Plan Comments: (Pt had cardiac stent placed February 2019.  Followed by Dr. Saralyn Pilar with Cardiology.  Pt was seen in PAT clinic and family states that he was told to hold his Plavix for 5 days prior to surgery, which they did.  PAT RN states that she instructed patient and family to discuss with Cardiologist the plan for holding his anticoagulation.  I contacted Dr. Saralyn Pilar to ensure he was aware of this plan to hold Plavix.  Dr. Saralyn Pilar was not aware of this plan and would not have advised holding Plavix, but since Plavix had already been held for 5 days to go ahead with the AVF procedure today.  Plan is to re-start pt's Plavix post op in PACU.   KLF)       Anesthesia Quick Evaluation

## 2017-12-07 NOTE — Discharge Instructions (Signed)

## 2017-12-07 NOTE — OR Nursing (Signed)
Dr Ola Spurr reviewed labs from [place] no new orders for any labs prior to procedure.

## 2017-12-08 ENCOUNTER — Encounter: Payer: Self-pay | Admitting: Vascular Surgery

## 2017-12-11 NOTE — Addendum Note (Signed)
Addendum  created 12/11/17 1836 by Durenda Hurt, MD   Sign clinical note

## 2017-12-11 NOTE — Anesthesia Postprocedure Evaluation (Addendum)
Anesthesia Post Note  Patient: Thomas [name]  Procedure(s) Performed: ARTERIOVENOUS (AV) FISTULA CREATION ( BRACHIOBASILIC STAGE ) (Left Arm Upper)  Patient location during evaluation: PACU Anesthesia Type: General Level of consciousness: awake and alert Pain management: pain level controlled Vital Signs Assessment: post-procedure vital signs reviewed and stable Respiratory status: spontaneous breathing, nonlabored ventilation and respiratory function stable Cardiovascular status: blood pressure returned to baseline and stable Postop Assessment: no apparent nausea or vomiting Anesthetic complications: no     Last Vitals:  Vitals:   12/07/17 1706 12/07/17 1723  BP: (!) 162/71 (!) 145/65  Pulse: 76 65  Resp: 16 16  Temp: (!) 36.3 C (!) 36.1 C  SpO2: 97% 96%    Last Pain:  Vitals:   12/08/17 0838  TempSrc:   PainSc: 0-No pain                 Durenda Hurt

## 2017-12-13 ENCOUNTER — Ambulatory Visit (INDEPENDENT_AMBULATORY_CARE_PROVIDER_SITE_OTHER): Payer: Medicare Other | Admitting: Family Medicine

## 2017-12-13 ENCOUNTER — Encounter: Payer: Self-pay | Admitting: Family Medicine

## 2017-12-13 VITALS — BP 150/73 | HR 60 | Temp 98.5°F | Resp 18 | Wt 161.0 lb

## 2017-12-13 DIAGNOSIS — N183 Chronic kidney disease, stage 3 (moderate): Secondary | ICD-10-CM

## 2017-12-13 DIAGNOSIS — E1122 Type 2 diabetes mellitus with diabetic chronic kidney disease: Secondary | ICD-10-CM | POA: Diagnosis not present

## 2017-12-13 DIAGNOSIS — I10 Essential (primary) hypertension: Secondary | ICD-10-CM

## 2017-12-13 LAB — POCT GLYCOSYLATED HEMOGLOBIN (HGB A1C)
Est. average glucose Bld gHb Est-mCnc: 240
Hemoglobin A1C: 10.9 % — AB (ref 4.0–5.6)

## 2017-12-13 MED ORDER — DULAGLUTIDE 0.75 MG/0.5ML ~~LOC~~ SOAJ: SUBCUTANEOUS | 0 refills | Status: DC

## 2017-12-13 NOTE — Progress Notes (Signed)
Patient: Thomas [name] Male    DOB: [birth year]   72 y.o.   MRN: [id number] Visit Date: 12/13/2017  Today's Provider: Lelon Huh, MD   Chief Complaint  Patient presents with  . Diabetes  . Hypertension  . Gout   Subjective:    HPI  Diabetes Mellitus Type II, Follow-up:   Lab Results  Component Value Date   HGBA1C 9.4 (A) 09/12/2017   HGBA1C 7.6 (H) 06/30/2017   HGBA1C 7.6 01/31/2017    Last seen for diabetes 3 months ago.  Changes since that visit includes changing Glipizide to 10mg  every morning . He reports good compliance with treatment. He is not having side effects.  Current symptoms include none and have been stable. Home blood sugar records: fasting range: 180-240  Episodes of hypoglycemia? no   Current Insulin Regimen: none Most Recent Eye Exam: 01/2017 Weight trend: decreasing steadily Prior visit with dietician: no Current diet: in general, a "healthy" diet   Current exercise: none  Pertinent Labs:    Component Value Date/Time   CHOL 140 06/30/2017 0816   TRIG 115 06/30/2017 0816   HDL 47 06/30/2017 0816   LDLCALC 70 06/30/2017 0816   CREATININE 3.82 (H) 11/30/2017 1037   CREATININE 1.13 04/03/2014 1535    Wt Readings from Last 3 Encounters:  12/13/17 161 lb (73 kg)  12/07/17 160 lb (72.6 kg)  11/30/17 165 lb (74.8 kg)    ------------------------------------------------------------------------  Hypertension, follow-up:  BP Readings from Last 3 Encounters:  12/13/17 (!) 150/73  12/07/17 (!) 145/65  11/30/17 (!) 155/74    He was last seen for hypertension 3 months ago.  BP at that visit was 160/84. Management since that visit includes advising patient to avoid sodium in his diet.  Has since seen Dr. Candiss Norse and started on amlodipine 2.5mg  last week, and was taken off of hydralazine due to side effects.  He reports good compliance with treatment.      Weight trend: decreasing steadily Wt Readings from Last 3 Encounters:    12/13/17 161 lb (73 kg)  12/07/17 160 lb (72.6 kg)  11/30/17 165 lb (74.8 kg)    Current diet: in general, a "healthy" diet    ------------------------------------------------------------------------ Follow up of Gout: Patient was last seen for this problem 3 months ago. Changes made during that visit includes adding Colchicine and Diclofenac. Patient reports this problem is stable. Patient is not currently taking any medications for Gout.     Allergies  Allergen Reactions  . Ciprofloxacin Itching and Swelling    Facial swelling   . Hydralazine Nausea And Vomiting     Current Outpatient Medications:  .  acetaminophen (TYLENOL) 500 MG tablet, Take 500 mg by mouth every 8 (eight) hours as needed for mild pain or headache., Disp: , Rfl:  .  amLODipine (NORVASC) 2.5 MG tablet, Take 1 tablet by mouth daily., Disp: , Rfl:  .  aspirin EC 81 MG tablet, Take 81 mg by mouth daily., Disp: , Rfl:  .  atorvastatin (LIPITOR) 80 MG tablet, TAKE 1 TABLET BY MOUTH ONCE DAILY (Patient taking differently: daily at 6 PM. ), Disp: 30 tablet, Rfl: 5 .  b complex vitamins capsule, Take 1 capsule by mouth daily., Disp: , Rfl:  .  buPROPion (WELLBUTRIN SR) 150 MG 12 hr tablet, Take 1 tablet (150 mg total) by mouth 2 (two) times daily., Disp: , Rfl:  .  clopidogrel (PLAVIX) 75 MG tablet, Take 1 tablet (75 mg  total) by mouth daily., Disp: 30 tablet, Rfl: 1 .  furosemide (LASIX) 40 MG tablet, Take 1 tablet (40 mg total) by mouth 2 (two) times daily., Disp: 60 tablet, Rfl: 0 .  glipiZIDE (GLIPIZIDE XL) 10 MG 24 hr tablet, Take 1 tablet (10 mg total) by mouth daily with breakfast., Disp: 30 tablet, Rfl: 0 .  isosorbide mononitrate (IMDUR) 60 MG 24 hr tablet, Take 1 tablet (60 mg total) by mouth daily., Disp: 30 tablet, Rfl: 5 .  metoprolol succinate (TOPROL-XL) 50 MG 24 hr tablet, Take 1 tablet (50 mg total) by mouth daily. Take with or immediately following a meal., Disp: 30 tablet, Rfl: 5 .  pantoprazole  (PROTONIX) 40 MG tablet, Take 1 tablet (40 mg total) by mouth daily. (Patient taking differently: Take 40 mg by mouth 2 (two) times daily. ), Disp: 30 tablet, Rfl: 12 .  sodium bicarbonate 650 MG tablet, Take 650 mg by mouth 2 (two) times daily., Disp: , Rfl:  .  tamsulosin (FLOMAX) 0.4 MG CAPS capsule, Take 1 capsule (0.4 mg total) by mouth daily after supper., Disp: 30 capsule, Rfl: 5 .  traMADol (ULTRAM) 50 MG tablet, Take 1 tablet (50 mg total) by mouth every 6 (six) hours as needed., Disp: 20 tablet, Rfl: 0 .  vitamin B-12 (CYANOCOBALAMIN) 1000 MCG tablet, Take 5,000 mcg by mouth daily., Disp: , Rfl:  .  colchicine 0.6 MG tablet, Take 1 tablet (0.6 mg total) by mouth daily as needed (gout). (Patient not taking: Reported on 11/17/2017), Disp: 30 tablet, Rfl: 1 .  diclofenac sodium (VOLTAREN) 1 % GEL, Apply 2 g topically 4 (four) times daily as needed. (Patient not taking: Reported on 11/17/2017), Disp: 25 g, Rfl: 1 .  Ferrous Sulfate (IRON) 325 (65 Fe) MG TABS, Take 325 mg by mouth 2 (two) times daily., Disp: , Rfl:  .  hydrALAZINE (APRESOLINE) 25 MG tablet, Take 1 tablet (25 mg total) by mouth every 12 (twelve) hours. (Patient not taking: Reported on 11/17/2017), Disp: 60 tablet, Rfl: 5  Review of Systems  Constitutional: Negative for appetite change, chills and fever.  Respiratory: Negative for chest tightness, shortness of breath and wheezing.   Cardiovascular: Negative for chest pain and palpitations.  Gastrointestinal: Negative for abdominal pain, nausea and vomiting.  Musculoskeletal: Positive for arthralgias.  Skin: Positive for color change and wound.  Neurological: Positive for numbness (on scalp).    Social History   Tobacco Use  . Smoking status: Former Smoker    Packs/day: 1.00    Years: 51.00    Pack years: 51.00    Types: Cigarettes    Last attempt to quit: 06/21/2017    Years since quitting: 0.4  . Smokeless tobacco: Never Used  . Tobacco comment: started smoking at age  [age]. Smoked for 50 years  Substance Use Topics  . Alcohol use: Yes    Alcohol/week: 0.0 standard drinks    Frequency: Never    Comment: 1 every 6 months   Objective:   BP (!) 150/73 (BP Location: Right Arm, Patient Position: Sitting, Cuff Size: Large)   Pulse 60   Temp 98.5 F (36.9 C) (Oral)   Resp 18   Wt 161 lb (73 kg)   SpO2 96% Comment: room air  BMI 25.99 kg/m  Vitals:   12/13/17 0954  BP: (!) 150/73  Pulse: 60  Resp: 18  Temp: 98.5 F (36.9 C)  TempSrc: Oral  SpO2: 96%  Weight: 161 lb (73 kg)  Physical Exam  General appearance: alert, well developed, well nourished, cooperative and in no distress Head: Normocephalic, without obvious abnormality, atraumatic Respiratory: Respirations even and unlabored, normal respiratory rate Extremities: No gross deformities Skin: Skin color, texture, turgor normal. No rashes seen  Psych: Appropriate mood and affect. Neurologic: Mental status: Alert, oriented to person, place, and time, thought content appropriate.   Results for orders placed or performed in visit on 12/13/17  POCT HgB A1C  Result Value Ref Range   Hemoglobin A1C 10.9 (A) 4.0 - 5.6 %   Est. average glucose Bld gHb Est-mCnc 240        Assessment & Plan:     1. Type 2 diabetes mellitus with stage 3 chronic kidney disease, without long-term current use of insulin (HCC) Uncontrolled, add- Dulaglutide (TRULICITY) 9.16 BW/4.6KZ SOPN; Inject 0.31ml once a week  Dispense: 6 pen; Refill: 0 First dose given in office today.  Follow up 6 weeks.   2. Essential (primary) hypertension Tolerating change to amlodipine well. Follow up nephrology as scheduled.        Lelon Huh, MD  [facility]

## 2017-12-13 NOTE — Patient Instructions (Signed)
   Take an OTC fiber supplement such as Fiber-Con once a day with a full glass of water.

## 2017-12-21 ENCOUNTER — Other Ambulatory Visit: Payer: Self-pay | Admitting: Family Medicine

## 2017-12-21 NOTE — Telephone Encounter (Signed)
Patient needs refills on on lancets and strips for Contour [employer] Meter sent to [facility]

## 2017-12-22 ENCOUNTER — Other Ambulatory Visit: Payer: Self-pay | Admitting: Family Medicine

## 2017-12-22 MED ORDER — GLUCOSE BLOOD VI STRP: ORAL_STRIP | 4 refills | Status: AC

## 2017-12-22 NOTE — Telephone Encounter (Signed)
Please review. Thanks!  

## 2017-12-22 NOTE — Telephone Encounter (Signed)
States she is requesting an RX for a OneTouch Meter in order for the insurance to cover the strips and lancets as well.

## 2017-12-23 MED ORDER — ONETOUCH ULTRA 2 W/DEVICE KIT: PACK | 0 refills | Status: AC

## 2017-12-23 MED ORDER — BLOOD GLUCOSE MONITOR KIT: PACK | 0 refills | Status: DC

## 2018-01-08 ENCOUNTER — Other Ambulatory Visit: Payer: Self-pay | Admitting: Family Medicine

## 2018-01-08 ENCOUNTER — Other Ambulatory Visit (INDEPENDENT_AMBULATORY_CARE_PROVIDER_SITE_OTHER): Payer: Self-pay | Admitting: Vascular Surgery

## 2018-01-08 ENCOUNTER — Other Ambulatory Visit: Payer: Self-pay

## 2018-01-08 DIAGNOSIS — R808 Other proteinuria: Secondary | ICD-10-CM | POA: Diagnosis not present

## 2018-01-08 DIAGNOSIS — N185 Chronic kidney disease, stage 5: Secondary | ICD-10-CM | POA: Diagnosis not present

## 2018-01-08 DIAGNOSIS — R600 Localized edema: Secondary | ICD-10-CM | POA: Diagnosis not present

## 2018-01-08 DIAGNOSIS — E875 Hyperkalemia: Secondary | ICD-10-CM | POA: Diagnosis not present

## 2018-01-08 DIAGNOSIS — E1129 Type 2 diabetes mellitus with other diabetic kidney complication: Secondary | ICD-10-CM | POA: Diagnosis not present

## 2018-01-08 NOTE — Patient Outreach (Signed)
[facility]) [name]  01/08/2018  Thomas [name] [birth year] [id number]   Medication Adherence call to Thomas [name] left a message for patient to call back patient is due  Glipizide Er 10 mg.   patients telephone number is disconnected under [facility]. Thomas [name] is showing past due under [place].   Thomas [name] Direct Dial [phone]  Fax [phone] [email or web address]

## 2018-01-09 ENCOUNTER — Other Ambulatory Visit: Payer: Self-pay | Admitting: Family Medicine

## 2018-01-09 NOTE — Telephone Encounter (Signed)
Patient needs to get future refills through his primary care provider, or make an appointment to be seen in our office.

## 2018-01-15 DIAGNOSIS — E1129 Type 2 diabetes mellitus with other diabetic kidney complication: Secondary | ICD-10-CM | POA: Diagnosis not present

## 2018-01-15 DIAGNOSIS — R809 Proteinuria, unspecified: Secondary | ICD-10-CM | POA: Diagnosis not present

## 2018-01-15 DIAGNOSIS — R600 Localized edema: Secondary | ICD-10-CM | POA: Diagnosis not present

## 2018-01-15 DIAGNOSIS — N2581 Secondary hyperparathyroidism of renal origin: Secondary | ICD-10-CM | POA: Diagnosis not present

## 2018-01-15 DIAGNOSIS — N185 Chronic kidney disease, stage 5: Secondary | ICD-10-CM | POA: Diagnosis not present

## 2018-01-16 ENCOUNTER — Encounter (INDEPENDENT_AMBULATORY_CARE_PROVIDER_SITE_OTHER): Payer: Self-pay | Admitting: Vascular Surgery

## 2018-01-16 ENCOUNTER — Ambulatory Visit (INDEPENDENT_AMBULATORY_CARE_PROVIDER_SITE_OTHER): Payer: Medicare Other | Admitting: Vascular Surgery

## 2018-01-16 ENCOUNTER — Other Ambulatory Visit: Payer: Self-pay | Admitting: Family Medicine

## 2018-01-16 VITALS — BP 157/76 | HR 81 | Resp 16 | Ht 66.0 in | Wt 164.6 lb

## 2018-01-16 DIAGNOSIS — I739 Peripheral vascular disease, unspecified: Secondary | ICD-10-CM

## 2018-01-16 DIAGNOSIS — I770 Arteriovenous fistula, acquired: Secondary | ICD-10-CM

## 2018-01-16 DIAGNOSIS — Z9889 Other specified postprocedural states: Secondary | ICD-10-CM

## 2018-01-16 DIAGNOSIS — I10 Essential (primary) hypertension: Secondary | ICD-10-CM

## 2018-01-16 DIAGNOSIS — N184 Chronic kidney disease, stage 4 (severe): Secondary | ICD-10-CM

## 2018-01-16 DIAGNOSIS — I129 Hypertensive chronic kidney disease with stage 1 through stage 4 chronic kidney disease, or unspecified chronic kidney disease: Secondary | ICD-10-CM

## 2018-01-16 NOTE — Assessment & Plan Note (Signed)
Status post intervention to both lower extremities in the past.  Legs currently doing well.  Continue current medical regimen and recheck in 6 months

## 2018-01-16 NOTE — Assessment & Plan Note (Signed)
Likely an underlying cause of his renal disease and blood pressure control important in reducing the progression of atherosclerotic disease. On appropriate oral medications.

## 2018-01-16 NOTE — Progress Notes (Signed)
Patient ID: Thomas [name], male   DOB: November 29, 1945, 72 y.o.   MRN: [id number]  Chief Complaint  Patient presents with  . Follow-up    [facility] 6week follow up    HPI Thomas [name] is a 72 y.o. male.  Patient returns about 6 weeks after left brachiocephalic AV fistula creation for chronic kidney disease nearing dialysis dependence.  He is stage IV/V CKD but has not yet started dialysis.  He actually said he saw his nephrologist yesterday and his numbers were a little bit better.  His GFR was 15.  He feels well.  His leg swelling is better.  His AV fistula has healed well and has an excellent thrill today.   Past Medical History:  Diagnosis Date  . Allergy   . Arthritis   . CAD (coronary artery disease)   . Chronic kidney disease   . Diabetes mellitus without complication ([place])   . Dyspnea   . Erosive esophagitis   . GERD (gastroesophageal reflux disease)   . Gouty arthropathy   . History of colonic polyps   . History of kidney stones   . History of MRSA infection   . Hyperkalemia   . Hyperlipidemia   . Hypertension   . Melanoma ([place])   . Myocardial infarction ([place]) 1986  . Peripheral vascular disease ([place])   . 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 Trigger finger of left hand   . Ureteral tumor     Past Surgical History:  Procedure Laterality Date  . AV FISTULA PLACEMENT Left 12/07/2017   Procedure: ARTERIOVENOUS (AV) FISTULA CREATION ( BRACHIOBASILIC STAGE );  Surgeon: Algernon Huxley, MD;  Location: [facility] ORS;  Service: Vascular;  Laterality: Left;  . CATARACT EXTRACTION    . CORONARY ARTERY BYPASS GRAFT  1996  . CORONARY STENT INTERVENTION N/A 05/29/2017   Procedure: CORONARY STENT INTERVENTION;  Surgeon: Isaias Cowman, MD;  Location: [place] CV LAB;  Service: Cardiovascular;  Laterality: N/A;  . EXCISION Ureteral Tumor     (benign) Dr. Quillian Quince  . EYE SURGERY Bilateral    Cataract Extraction with IOL  . LEFT HEART CATH AND CORONARY  ANGIOGRAPHY N/A 10/18/2016   Procedure: Left Heart Cath and Coronary Angiography;  Surgeon: Isaias Cowman, MD;  Location: [place] CV LAB;  Service: Cardiovascular;  Laterality: N/A;  . LEFT HEART CATH AND CORONARY ANGIOGRAPHY N/A 05/29/2017   Procedure: LEFT HEART CATH AND CORONARY ANGIOGRAPHY;  Surgeon: Isaias Cowman, MD;  Location: [place] CV LAB;  Service: Cardiovascular;  Laterality: N/A;  . LOWER EXTREMITY ANGIOGRAPHY Right 10/31/2016   Procedure: Lower Extremity Angiography;  Surgeon: Algernon Huxley, MD;  Location: [place] CV LAB;  Service: Cardiovascular;  Laterality: Right;  . LOWER EXTREMITY ANGIOGRAPHY Left 11/14/2016   Procedure: Lower Extremity Angiography;  Surgeon: Algernon Huxley, MD;  Location: [place] CV LAB;  Service: Cardiovascular;  Laterality: Left;  . LOWER EXTREMITY INTERVENTION  11/14/2016   Procedure: Lower Extremity Intervention;  Surgeon: Algernon Huxley, MD;  Location: [place] CV LAB;  Service: Cardiovascular;;  . RETINAL DETACHMENT SURGERY Right November 2107      Allergies  Allergen Reactions  . Ciprofloxacin Itching and Swelling    Facial swelling   . Hydralazine Nausea And Vomiting    Current Outpatient Medications  Medication Sig Dispense Refill  . acetaminophen (TYLENOL) 500 MG tablet Take 500 mg by mouth every 8 (eight) hours as needed for mild pain  or headache.    . amLODipine (NORVASC) 2.5 MG tablet Take 1 tablet by mouth daily.    . aspirin EC 81 MG tablet Take 81 mg by mouth daily.    . atorvastatin (LIPITOR) 80 MG tablet TAKE 1 TABLET BY MOUTH ONCE DAILY (Patient taking differently: daily at 6 PM. ) 30 tablet 5  . b complex vitamins capsule Take 1 capsule by mouth daily.    . Blood Glucose Monitoring Suppl (ONE TOUCH ULTRA 2) w/Device KIT Use to check gluocose daily for type 2 diabetes 1 each 0  . buPROPion (WELLBUTRIN SR) 150 MG 12 hr tablet Take 1 tablet (150 mg total) by mouth 2 (two) times daily.    . clopidogrel  (PLAVIX) 75 MG tablet TAKE 1 TABLET BY MOUTH ONCE DAILY 30 tablet 2  . Dulaglutide (TRULICITY) 0.75 MG/0.5ML SOPN Inject 0.5ml once a week 6 pen 0  . Ferrous Sulfate (IRON) 325 (65 Fe) MG TABS Take 325 mg by mouth 2 (two) times daily.    . furosemide (LASIX) 40 MG tablet Take 1 tablet (40 mg total) by mouth 2 (two) times daily. 60 tablet 0  . glipiZIDE (GLUCOTROL XL) 10 MG 24 hr tablet TAKE 1 TABLET BY MOUTH ONCE DAILY WITH BREAKFAST 30 tablet 12  . glucose blood test strip Contour [employer] test strips and lancets Use to check blood sugar daily for type 2 diabetes, E11.9. 100 each 4  . isosorbide mononitrate (IMDUR) 60 MG 24 hr tablet TAKE 1 TABLET BY MOUTH ONCE DAILY. 30 tablet 5  . metoprolol succinate (TOPROL-XL) 50 MG 24 hr tablet TAKE 1 TABLET BY MOUTH ONCE DAILY WITH OR IMMEDIATELY FOLLOWING A MEAL. 30 tablet 5  . pantoprazole (PROTONIX) 40 MG tablet Take 1 tablet (40 mg total) by mouth daily. (Patient taking differently: Take 40 mg by mouth 2 (two) times daily. ) 30 tablet 12  . sodium bicarbonate 650 MG tablet Take 650 mg by mouth 2 (two) times daily.    . tamsulosin (FLOMAX) 0.4 MG CAPS capsule Take 1 capsule (0.4 mg total) by mouth daily after supper. 30 capsule 5  . traMADol (ULTRAM) 50 MG tablet Take 1 tablet (50 mg total) by mouth every 6 (six) hours as needed. 20 tablet 0  . vitamin B-12 (CYANOCOBALAMIN) 1000 MCG tablet Take 5,000 mcg by mouth daily.    . colchicine 0.6 MG tablet Take 1 tablet (0.6 mg total) by mouth daily as needed (gout). (Patient not taking: Reported on 11/17/2017) 30 tablet 1  . diclofenac sodium (VOLTAREN) 1 % GEL Apply 2 g topically 4 (four) times daily as needed. (Patient not taking: Reported on 11/17/2017) 25 g 1   No current facility-administered medications for this visit.         Physical Exam BP (!) 157/76 (BP Location: Right Arm)   Pulse 81   Resp 16   Ht 5' 6" (1.676 m)   Wt 164 lb 9.6 oz (74.7 kg)   BMI 26.57 kg/m  Gen:  WD/WN, NAD Skin:  incision C/D/I. Excellent thrill in left brachiocephalic AVF     Assessment/Plan:  Essential (primary) hypertension Likely an underlying cause of his renal disease and blood pressure control important in reducing the progression of atherosclerotic disease. On appropriate oral medications.   PAD (peripheral artery disease) (HCC) Status post intervention to both lower extremities in the past.  Legs currently doing well.  Continue current medical regimen and recheck in 6 months  CKD (chronic kidney disease), stage IV (HCC)   Fistula is healing well.  He is not currently requiring dialysis, but when he does, his fistula can be used for dialysis. Plan to check a duplex in 6 months.      Leotis Pain 01/16/2018, 9:45 AM   This note was created with Dragon medical transcription system.  Any errors from dictation are unintentional.

## 2018-01-16 NOTE — Assessment & Plan Note (Signed)
Fistula is healing well.  He is not currently requiring dialysis, but when he does, his fistula can be used for dialysis. Plan to check a duplex in 6 months.

## 2018-01-24 ENCOUNTER — Ambulatory Visit (INDEPENDENT_AMBULATORY_CARE_PROVIDER_SITE_OTHER): Payer: Medicare Other | Admitting: Family Medicine

## 2018-01-24 ENCOUNTER — Encounter: Payer: Self-pay | Admitting: Family Medicine

## 2018-01-24 VITALS — BP 164/62 | HR 79 | Temp 97.9°F | Resp 16 | Wt 165.0 lb

## 2018-01-24 DIAGNOSIS — E1122 Type 2 diabetes mellitus with diabetic chronic kidney disease: Secondary | ICD-10-CM | POA: Diagnosis not present

## 2018-01-24 DIAGNOSIS — N183 Chronic kidney disease, stage 3 (moderate): Secondary | ICD-10-CM | POA: Diagnosis not present

## 2018-01-24 LAB — POCT GLYCOSYLATED HEMOGLOBIN (HGB A1C)
Est. average glucose Bld gHb Est-mCnc: 220
Hemoglobin A1C: 9.3 % — AB (ref 4.0–5.6)

## 2018-01-24 MED ORDER — DULAGLUTIDE 0.75 MG/0.5ML ~~LOC~~ SOAJ: 0.5000 mL | SUBCUTANEOUS | 6 refills | Status: DC

## 2018-01-24 NOTE — Progress Notes (Signed)
Patient: Thomas [name] Male    DOB: Feb 12, 1946   72 y.o.   MRN: [id number] Visit Date: 01/24/2018  Today's Provider: Lelon Huh, MD   Chief Complaint  Patient presents with  . Diabetes   Subjective:    HPI  Diabetes Mellitus Type II, Follow-up:   Lab Results  Component Value Date   HGBA1C 10.9 (A) 12/13/2017   HGBA1C 9.4 (A) 09/12/2017   HGBA1C 7.6 (H) 06/30/2017    Last seen for diabetes 6 weeks ago.  Management since then includes adding Trulicity. He reports good compliance with treatment. He is not having side effects.  Current symptoms include none and have been stable. Home blood sugar records: fasting range: 117-121  Episodes of hypoglycemia? no   Current Insulin Regimen: none Most Recent Eye Exam: 1 year ago Weight trend: stable Prior visit with dietician: no Current diet: well balanced Current exercise: walking  Pertinent Labs:    Component Value Date/Time   CHOL 140 06/30/2017 0816   TRIG 115 06/30/2017 0816   HDL 47 06/30/2017 0816   LDLCALC 70 06/30/2017 0816   CREATININE 3.82 (H) 11/30/2017 1037   CREATININE 1.13 04/03/2014 1535    Wt Readings from Last 3 Encounters:  01/24/18 165 lb (74.8 kg)  01/16/18 164 lb 9.6 oz (74.7 kg)  12/13/17 161 lb (73 kg)    He states he recently saw his nephrologist and kidney functions have improved. States he feels better, has more energy, and is less short of breath since last visit. No swelling. No GI changes.  ------------------------------------------------------------------------     Allergies  Allergen Reactions  . Ciprofloxacin Itching and Swelling    Facial swelling   . Hydralazine Nausea And Vomiting     Current Outpatient Medications:  .  acetaminophen (TYLENOL) 500 MG tablet, Take 500 mg by mouth every 8 (eight) hours as needed for mild pain or headache., Disp: , Rfl:  .  amLODipine (NORVASC) 2.5 MG tablet, Take 1 tablet by mouth daily., Disp: , Rfl:  .  aspirin EC 81 MG  tablet, Take 81 mg by mouth daily., Disp: , Rfl:  .  atorvastatin (LIPITOR) 80 MG tablet, TAKE 1 TABLET BY MOUTH ONCE DAILY (Patient taking differently: daily at 6 PM. ), Disp: 30 tablet, Rfl: 5 .  b complex vitamins capsule, Take 1 capsule by mouth daily., Disp: , Rfl:  .  Blood Glucose Monitoring Suppl (ONE TOUCH ULTRA 2) w/Device KIT, Use to check gluocose daily for type 2 diabetes, Disp: 1 each, Rfl: 0 .  buPROPion (WELLBUTRIN SR) 150 MG 12 hr tablet, Take 1 tablet (150 mg total) by mouth 2 (two) times daily., Disp: , Rfl:  .  clopidogrel (PLAVIX) 75 MG tablet, TAKE 1 TABLET BY MOUTH ONCE DAILY, Disp: 30 tablet, Rfl: 2 .  colchicine 0.6 MG tablet, Take 1 tablet (0.6 mg total) by mouth daily as needed (gout)., Disp: 30 tablet, Rfl: 1 .  diclofenac sodium (VOLTAREN) 1 % GEL, Apply 2 g topically 4 (four) times daily as needed., Disp: 25 g, Rfl: 1 .  Dulaglutide (TRULICITY) 9.32 TF/5.7DU SOPN, Inject 0.26m once a week, Disp: 6 pen, Rfl: 0 .  Ferrous Sulfate (IRON) 325 (65 Fe) MG TABS, Take 325 mg by mouth 2 (two) times daily., Disp: , Rfl:  .  furosemide (LASIX) 40 MG tablet, Take 1 tablet (40 mg total) by mouth 2 (two) times daily., Disp: 60 tablet, Rfl: 0 .  glipiZIDE (GLUCOTROL XL) 10 MG  24 hr tablet, TAKE 1 TABLET BY MOUTH ONCE DAILY WITH BREAKFAST, Disp: 30 tablet, Rfl: 12 .  glucose blood test strip, Contour [employer] test strips and lancets Use to check blood sugar daily for type 2 diabetes, E11.9., Disp: 100 each, Rfl: 4 .  isosorbide mononitrate (IMDUR) 60 MG 24 hr tablet, TAKE 1 TABLET BY MOUTH ONCE DAILY., Disp: 30 tablet, Rfl: 5 .  metoprolol succinate (TOPROL-XL) 50 MG 24 hr tablet, TAKE 1 TABLET BY MOUTH ONCE DAILY WITH OR IMMEDIATELY FOLLOWING A MEAL., Disp: 30 tablet, Rfl: 5 .  pantoprazole (PROTONIX) 40 MG tablet, Take 1 tablet (40 mg total) by mouth daily. (Patient taking differently: Take 40 mg by mouth 2 (two) times daily. ), Disp: 30 tablet, Rfl: 12 .  sodium bicarbonate 650 MG  tablet, Take 650 mg by mouth 2 (two) times daily., Disp: , Rfl:  .  tamsulosin (FLOMAX) 0.4 MG CAPS capsule, TAKE 1 CAPSULE BY MOUTH ONCE DAILY AFTERSUPPER, Disp: 30 capsule, Rfl: 5 .  traMADol (ULTRAM) 50 MG tablet, Take 1 tablet (50 mg total) by mouth every 6 (six) hours as needed., Disp: 20 tablet, Rfl: 0 .  vitamin B-12 (CYANOCOBALAMIN) 1000 MCG tablet, Take 5,000 mcg by mouth daily., Disp: , Rfl:   Review of Systems  Constitutional: Negative for appetite change, chills and fever.  Respiratory: Negative for chest tightness, shortness of breath and wheezing.   Cardiovascular: Negative for chest pain and palpitations.  Gastrointestinal: Negative for abdominal pain, nausea and vomiting.    Social History   Tobacco Use  . Smoking status: Former Smoker    Packs/day: 1.00    Years: 51.00    Pack years: 51.00    Types: Cigarettes    Last attempt to quit: 06/21/2017    Years since quitting: 0.5  . Smokeless tobacco: Never Used  . Tobacco comment: started smoking at age [age]. Smoked for 50 years  Substance Use Topics  . Alcohol use: Yes    Alcohol/week: 0.0 standard drinks    Frequency: Never    Comment: 1 every 6 months   Objective:   BP (!) 164/62 (BP Location: Right Arm, Cuff Size: Normal)   Pulse 79   Temp 97.9 F (36.6 C) (Oral)   Resp 16   Wt 165 lb (74.8 kg)   SpO2 96% Comment: room air  BMI 26.63 kg/m  Vitals:   01/24/18 0841 01/24/18 0845  BP: (!) 164/81 (!) 164/62  Pulse: 79   Resp: 16   Temp: 97.9 F (36.6 C)   TempSrc: Oral   SpO2: 96%   Weight: 165 lb (74.8 kg)      Physical Exam  General appearance: alert, well developed, well nourished, cooperative and in no distress Head: Normocephalic, without obvious abnormality, atraumatic Respiratory: Respirations even and unlabored, normal respiratory rate Extremities: No gross deformities Skin: Skin color, texture, turgor normal. No rashes seen  Psych: Appropriate mood and affect. Neurologic: Mental status:  Alert, oriented to person, place, and time, thought content appropriate.   Results for orders placed or performed in visit on 01/24/18  POCT HgB A1C  Result Value Ref Range   Hemoglobin A1C 9.3 (A) 4.0 - 5.6 %   HbA1c POC (<> result, manual entry)     HbA1c, POC (prediabetic range)     HbA1c, POC (controlled diabetic range)     Est. average glucose Bld gHb Est-mCnc 220        Assessment & Plan:     1. Type 2 diabetes  mellitus with stage 3 chronic kidney disease, without long-term current use of insulin ([place]) Doing well with initiation of Trulicity. Continue current dose. He is to start checking sugars in the evenings and let me know if he has any hypoglycemia. Consider reducing glipizide if a1c gets close to goal.  - POCT HgB A1C - Dulaglutide (TRULICITY) 5.64 PP/2.9JJ SOPN; Inject 0.5 mLs as directed once a week.  Dispense: 4 pen; Refill: 6  Follow up in 3 months      Lelon Huh, MD  [facility]

## 2018-02-16 ENCOUNTER — Telehealth: Payer: Self-pay | Admitting: Family Medicine

## 2018-02-16 NOTE — Telephone Encounter (Signed)
That's fine. I think we have one sample pen.

## 2018-02-16 NOTE — Telephone Encounter (Signed)
Patient is asking for a sample of Trulicity 1.82/88. Ml to last him through November.

## 2018-02-16 NOTE — Telephone Encounter (Signed)
Patient's wife called back check on pen, so I told that we could give him a sample box.  cbe

## 2018-02-19 ENCOUNTER — Ambulatory Visit
Admission: RE | Admit: 2018-02-19 | Discharge: 2018-02-19 | Disposition: A | Discharge status: Home / Self Care | Payer: Medicare Other | Source: Ambulatory Visit | Attending: Family Medicine | Admitting: Family Medicine

## 2018-02-19 ENCOUNTER — Ambulatory Visit (INDEPENDENT_AMBULATORY_CARE_PROVIDER_SITE_OTHER): Payer: Medicare Other | Admitting: Family Medicine

## 2018-02-19 ENCOUNTER — Encounter: Payer: Self-pay | Admitting: Family Medicine

## 2018-02-19 ENCOUNTER — Telehealth: Payer: Self-pay

## 2018-02-19 VITALS — BP 144/70 | HR 98 | Temp 98.0°F | Resp 20 | Wt 168.0 lb

## 2018-02-19 DIAGNOSIS — R000 Tachycardia, unspecified: Secondary | ICD-10-CM | POA: Diagnosis not present

## 2018-02-19 DIAGNOSIS — R05 Cough: Secondary | ICD-10-CM | POA: Diagnosis not present

## 2018-02-19 DIAGNOSIS — J439 Emphysema, unspecified: Secondary | ICD-10-CM

## 2018-02-19 DIAGNOSIS — R109 Unspecified abdominal pain: Secondary | ICD-10-CM

## 2018-02-19 DIAGNOSIS — R0609 Other forms of dyspnea: Secondary | ICD-10-CM | POA: Diagnosis not present

## 2018-02-19 DIAGNOSIS — R140 Abdominal distension (gaseous): Secondary | ICD-10-CM | POA: Diagnosis not present

## 2018-02-19 MED ORDER — DOXYCYCLINE HYCLATE 100 MG PO TABS: 100.0000 mg | ORAL_TABLET | Freq: Two times a day (BID) | ORAL | 0 refills | Status: DC

## 2018-02-19 NOTE — Patient Instructions (Signed)
.   Please go to the lab draw station in [address] on the second floor of [facility] to the [facility] on [address] for chest and abdominal  Xray

## 2018-02-19 NOTE — Telephone Encounter (Signed)
-----   Message from Birdie Sons, MD sent at 02/19/2018  3:53 PM EST ----- xrays show of bronchitis and a little bit fluid in lungs. Start taking doxycyline 100mg  twice a day for 10 days. Take an extra furosemide every other day, starting today. Stay off of the Trulicity for now. Follow up 7-10 days. Most of lab results should be back tomorrow.

## 2018-02-19 NOTE — Progress Notes (Signed)
Patient: Thomas [name] Male    DOB: 1945-06-05   72 y.o.   MRN: [id number] Visit Date: 02/19/2018  Today's Provider: Lelon Huh, MD   Chief Complaint  Patient presents with  . Shortness of Breath  . Medication Problem   Subjective:    Shortness of Breath  This is a new problem. Associated symptoms include abdominal pain, leg swelling and sputum production. Pertinent negatives include no chest pain, fever, headaches, leg pain, vomiting or wheezing. The symptoms are aggravated by any activity and lying flat. The patient has no known risk factors for DVT/PE.  He states dyspnea feel similar to sx he had prior to hospitalization for CHF in March. However he has also been having abdominal discomfort and bloating getting progressively worse for the last 3-4 weeks. He denies constipation but is taking fiber daily. He is concerned that sx may be adverse effect of Trulicity which he started in August. Denies chest pain or palpitation. Had had swelling off and on, but no more than usual the last few days. States he is taking furosemide consistently.   Wt Readings from Last 3 Encounters:  02/19/18 168 lb (76.2 kg)  01/24/18 165 lb (74.8 kg)  01/16/18 164 lb 9.6 oz (74.7 kg)        Allergies  Allergen Reactions  . Ciprofloxacin Itching and Swelling    Facial swelling   . Hydralazine Nausea And Vomiting     Current Outpatient Medications:  .  acetaminophen (TYLENOL) 500 MG tablet, Take 500 mg by mouth every 8 (eight) hours as needed for mild pain or headache., Disp: , Rfl:  .  amLODipine (NORVASC) 2.5 MG tablet, Take 1 tablet by mouth daily., Disp: , Rfl:  .  aspirin EC 81 MG tablet, Take 81 mg by mouth daily., Disp: , Rfl:  .  atorvastatin (LIPITOR) 80 MG tablet, TAKE 1 TABLET BY MOUTH ONCE DAILY (Patient taking differently: daily at 6 PM. ), Disp: 30 tablet, Rfl: 5 .  b complex vitamins capsule, Take 1 capsule by mouth daily., Disp: , Rfl:  .  Blood Glucose Monitoring Suppl  (ONE TOUCH ULTRA 2) w/Device KIT, Use to check gluocose daily for type 2 diabetes, Disp: 1 each, Rfl: 0 .  buPROPion (WELLBUTRIN SR) 150 MG 12 hr tablet, Take 1 tablet (150 mg total) by mouth 2 (two) times daily., Disp: , Rfl:  .  clopidogrel (PLAVIX) 75 MG tablet, TAKE 1 TABLET BY MOUTH ONCE DAILY, Disp: 30 tablet, Rfl: 2 .  colchicine 0.6 MG tablet, Take 1 tablet (0.6 mg total) by mouth daily as needed (gout)., Disp: 30 tablet, Rfl: 1 .  diclofenac sodium (VOLTAREN) 1 % GEL, Apply 2 g topically 4 (four) times daily as needed., Disp: 25 g, Rfl: 1 .  Dulaglutide (TRULICITY) 0.26 VZ/8.5YI SOPN, Inject 0.5 mLs as directed once a week., Disp: 4 pen, Rfl: 6 .  furosemide (LASIX) 40 MG tablet, Take 1 tablet (40 mg total) by mouth 2 (two) times daily., Disp: 60 tablet, Rfl: 0 .  glipiZIDE (GLUCOTROL XL) 10 MG 24 hr tablet, TAKE 1 TABLET BY MOUTH ONCE DAILY WITH BREAKFAST, Disp: 30 tablet, Rfl: 12 .  glucose blood test strip, Contour Ascensia test strips and lancets Use to check blood sugar daily for type 2 diabetes, E11.9., Disp: 100 each, Rfl: 4 .  isosorbide mononitrate (IMDUR) 60 MG 24 hr tablet, TAKE 1 TABLET BY MOUTH ONCE DAILY., Disp: 30 tablet, Rfl: 5 .  metoprolol succinate (  TOPROL-XL) 50 MG 24 hr tablet, TAKE 1 TABLET BY MOUTH ONCE DAILY WITH OR IMMEDIATELY FOLLOWING A MEAL., Disp: 30 tablet, Rfl: 5 .  pantoprazole (PROTONIX) 40 MG tablet, Take 1 tablet (40 mg total) by mouth daily. (Patient taking differently: Take 40 mg by mouth 2 (two) times daily. ), Disp: 30 tablet, Rfl: 12 .  sodium bicarbonate 650 MG tablet, Take 650 mg by mouth 2 (two) times daily., Disp: , Rfl:  .  tamsulosin (FLOMAX) 0.4 MG CAPS capsule, TAKE 1 CAPSULE BY MOUTH ONCE DAILY AFTERSUPPER, Disp: 30 capsule, Rfl: 5 .  traMADol (ULTRAM) 50 MG tablet, Take 1 tablet (50 mg total) by mouth every 6 (six) hours as needed., Disp: 20 tablet, Rfl: 0 .  vitamin B-12 (CYANOCOBALAMIN) 1000 MCG tablet, Take 5,000 mcg by mouth daily., Disp:  , Rfl:  .  Ferrous Sulfate (IRON) 325 (65 Fe) MG TABS, Take 325 mg by mouth 2 (two) times daily., Disp: , Rfl:   Review of Systems  Constitutional: Positive for appetite change. Negative for activity change, chills, diaphoresis, fatigue, fever and unexpected weight change.  Respiratory: Positive for cough, sputum production and shortness of breath. Negative for apnea, choking, chest tightness, wheezing and stridor.   Cardiovascular: Positive for leg swelling. Negative for chest pain and palpitations.  Gastrointestinal: Positive for abdominal distention, abdominal pain and diarrhea. Negative for anal bleeding, blood in stool, constipation, nausea, rectal pain and vomiting.  Musculoskeletal: Negative.   Neurological: Negative for dizziness, light-headedness and headaches.    Social History   Tobacco Use  . Smoking status: Former Smoker    Packs/day: 1.00    Years: 51.00    Pack years: 51.00    Types: Cigarettes    Last attempt to quit: 06/21/2017    Years since quitting: 0.6  . Smokeless tobacco: Never Used  . Tobacco comment: started smoking at age [age]. Smoked for 50 years  Substance Use Topics  . Alcohol use: Yes    Alcohol/week: 0.0 standard drinks    Frequency: Never    Comment: 1 every 6 months   Objective:   BP (!) 144/70 (BP Location: Right Arm, Patient Position: Sitting, Cuff Size: Large)   Pulse 98   Temp 98 F (36.7 C) (Oral)   Resp 20   Wt 168 lb (76.2 kg)   SpO2 96%   BMI 27.12 kg/m  Vitals:   02/19/18 0955  BP: (!) 144/70  Pulse: 98  Resp: 20  Temp: 98 F (36.7 C)  TempSrc: Oral  SpO2: 96%  Weight: 168 lb (76.2 kg)     Physical Exam  General Appearance:    Alert, cooperative, no distress  Eyes:    PERRL, conjunctiva/corneas clear, EOM's intact       Lungs:     Clear to auscultation bilaterally, respirations unlabored  Heart:    Mildly tachycardic with frequent premature beats. , 1+ bipedal edema.   Abdomen:   bowel sounds present and normal in all  4 quadrants, soft, round. Mildly distended. Slight tenderness across upper abdomen.    EKG:    Assessment & Plan:     1. Pulmonary emphysema, unspecified emphysema type ([place])  - EKG 12-Lead  2. Tachycardia  - TSH - Magnesium  3. Dyspnea on exertion  - Comprehensive metabolic panel - CBC - Brain natriuretic peptide - DG Chest 2 View; Future  4. Abdominal pain, unspecified abdominal location  - DG Abd 2 Views; Future       Lelon Huh, MD  [facility]

## 2018-02-19 NOTE — Telephone Encounter (Signed)
Pt advised.  Rx sent to [facility].  Apt made for 11/11 at 8:40  Thanks,   -Mickel Baas

## 2018-02-20 ENCOUNTER — Telehealth: Payer: Self-pay | Admitting: Family Medicine

## 2018-02-20 ENCOUNTER — Other Ambulatory Visit: Payer: Self-pay | Admitting: Family Medicine

## 2018-02-20 LAB — COMPREHENSIVE METABOLIC PANEL
A/G RATIO: 1.3 (ref 1.2–2.2)
ALBUMIN: 3.5 g/dL (ref 3.5–4.8)
ALK PHOS: 137 IU/L — AB (ref 39–117)
ALT: 12 IU/L (ref 0–44)
AST: 14 IU/L (ref 0–40)
BILIRUBIN TOTAL: 0.3 mg/dL (ref 0.0–1.2)
BUN/Creatinine Ratio: 11 (ref 10–24)
BUN: 45 mg/dL — AB (ref 8–27)
CHLORIDE: 99 mmol/L (ref 96–106)
CO2: 17 mmol/L — ABNORMAL LOW (ref 20–29)
Calcium: 8.8 mg/dL (ref 8.6–10.2)
Creatinine, Ser: 4.13 mg/dL — ABNORMAL HIGH (ref 0.76–1.27)
GFR calc non Af Amer: 13 mL/min/{1.73_m2} — ABNORMAL LOW (ref >59)
GFR, EST AFRICAN AMERICAN: 15 mL/min/{1.73_m2} — AB (ref >59)
GLUCOSE: 141 mg/dL — AB (ref 65–99)
Globulin, Total: 2.6 g/dL (ref 1.5–4.5)
POTASSIUM: 4.8 mmol/L (ref 3.5–5.2)
Sodium: 136 mmol/L (ref 134–144)
Total Protein: 6.1 g/dL (ref 6.0–8.5)

## 2018-02-20 LAB — CBC
HEMATOCRIT: 28.2 % — AB (ref 37.5–51.0)
Hemoglobin: 9.6 g/dL — ABNORMAL LOW (ref 13.0–17.7)
MCH: 28.8 pg (ref 26.6–33.0)
MCHC: 34 g/dL (ref 31.5–35.7)
MCV: 85 fL (ref 79–97)
Platelets: 400 10*3/uL (ref 150–450)
RBC: 3.33 x10E6/uL — ABNORMAL LOW (ref 4.14–5.80)
RDW: 13.6 % (ref 12.3–15.4)
WBC: 10.5 10*3/uL (ref 3.4–10.8)

## 2018-02-20 LAB — MAGNESIUM: MAGNESIUM: 1.6 mg/dL (ref 1.6–2.3)

## 2018-02-20 LAB — TSH: TSH: 2.62 u[IU]/mL (ref 0.450–4.500)

## 2018-02-20 LAB — BRAIN NATRIURETIC PEPTIDE: BNP: 2611.7 pg/mL — ABNORMAL HIGH (ref 0.0–100.0)

## 2018-02-20 MED ORDER — AMOXICILLIN-POT CLAVULANATE 875-125 MG PO TABS: 1.0000 | ORAL_TABLET | Freq: Two times a day (BID) | ORAL | 0 refills | Status: DC

## 2018-02-20 NOTE — Telephone Encounter (Signed)
Spoke with Mrs. [place].  She states they are not able to afford Doxycycline at this time.  Cash pay is $17.  Is there a different antibiotic you can prescribe?   Thanks,   -Mickel Baas

## 2018-02-20 NOTE — Telephone Encounter (Signed)
Pt's wife advised of results.    Thanks,   -Mickel Baas

## 2018-02-20 NOTE — Telephone Encounter (Signed)
Pt's wife called regarding antibiotic that was called in for pt from yesterday's visit.  Insurance will not cover it.  Wanting a call back before the pharmacy closes today. Pt needing medication for bronchitis.  Also checking on his lab results.  Please advise.  Thanks, [employer]

## 2018-02-20 NOTE — Telephone Encounter (Signed)
doxycycline comes as doxycycline hyclate or doxycycline monohydrate. please call pharmacy and see if they have a less expensive formulation of doxycycline.

## 2018-02-20 NOTE — Telephone Encounter (Signed)
That's fine, can change to Augmentin 875bid. Please send prescription and cancel doxycycline. Advise patient of change.

## 2018-02-20 NOTE — Telephone Encounter (Signed)
Pt's wife Vaughan Basta is requesting call back to go over the results that pt was given yesterday. Please advise. Thanks TNP

## 2018-02-20 NOTE — Telephone Encounter (Signed)
I spoke with the pharmacist at [facility].  He states $17 is the cheapest price.  He did mention that Augmentin 1 BID for 10 days will only be about $8 for the pt.  Would it be okay to change to Augmentin?  Thanks,   -Mickel Baas

## 2018-02-20 NOTE — Telephone Encounter (Signed)
Mrs. Ryland advised.    Thanks,   -Mickel Baas

## 2018-02-21 ENCOUNTER — Other Ambulatory Visit: Payer: Self-pay

## 2018-02-21 ENCOUNTER — Encounter: Payer: Self-pay | Admitting: Emergency Medicine

## 2018-02-21 ENCOUNTER — Telehealth: Payer: Self-pay

## 2018-02-21 ENCOUNTER — Emergency Department: Payer: Medicare Other

## 2018-02-21 ENCOUNTER — Emergency Department
Admission: EM | Admit: 2018-02-21 | Discharge: 2018-02-21 | Disposition: A | Discharge status: Home / Self Care | Payer: Medicare Other | Source: Home / Self Care | Attending: Emergency Medicine | Admitting: Emergency Medicine

## 2018-02-21 DIAGNOSIS — I5043 Acute on chronic combined systolic (congestive) and diastolic (congestive) heart failure: Secondary | ICD-10-CM | POA: Diagnosis present

## 2018-02-21 DIAGNOSIS — K219 Gastro-esophageal reflux disease without esophagitis: Secondary | ICD-10-CM | POA: Diagnosis present

## 2018-02-21 DIAGNOSIS — M1990 Unspecified osteoarthritis, unspecified site: Secondary | ICD-10-CM | POA: Diagnosis not present

## 2018-02-21 DIAGNOSIS — R0602 Shortness of breath: Secondary | ICD-10-CM

## 2018-02-21 DIAGNOSIS — Z87891 Personal history of nicotine dependence: Secondary | ICD-10-CM | POA: Diagnosis not present

## 2018-02-21 DIAGNOSIS — I248 Other forms of acute ischemic heart disease: Secondary | ICD-10-CM | POA: Diagnosis not present

## 2018-02-21 DIAGNOSIS — Z820 Family history of epilepsy and other diseases of the nervous system: Secondary | ICD-10-CM

## 2018-02-21 DIAGNOSIS — Z9849 Cataract extraction status, unspecified eye: Secondary | ICD-10-CM

## 2018-02-21 DIAGNOSIS — I452 Bifascicular block: Secondary | ICD-10-CM | POA: Diagnosis not present

## 2018-02-21 DIAGNOSIS — Z801 Family history of malignant neoplasm of trachea, bronchus and lung: Secondary | ICD-10-CM | POA: Diagnosis not present

## 2018-02-21 DIAGNOSIS — I252 Old myocardial infarction: Secondary | ICD-10-CM

## 2018-02-21 DIAGNOSIS — I5023 Acute on chronic systolic (congestive) heart failure: Secondary | ICD-10-CM | POA: Insufficient documentation

## 2018-02-21 DIAGNOSIS — I2510 Atherosclerotic heart disease of native coronary artery without angina pectoris: Secondary | ICD-10-CM | POA: Diagnosis present

## 2018-02-21 DIAGNOSIS — Z87442 Personal history of urinary calculi: Secondary | ICD-10-CM

## 2018-02-21 DIAGNOSIS — I5022 Chronic systolic (congestive) heart failure: Secondary | ICD-10-CM | POA: Diagnosis not present

## 2018-02-21 DIAGNOSIS — N184 Chronic kidney disease, stage 4 (severe): Secondary | ICD-10-CM

## 2018-02-21 DIAGNOSIS — Z85828 Personal history of other malignant neoplasm of skin: Secondary | ICD-10-CM

## 2018-02-21 DIAGNOSIS — Z7982 Long term (current) use of aspirin: Secondary | ICD-10-CM | POA: Insufficient documentation

## 2018-02-21 DIAGNOSIS — Z8614 Personal history of Methicillin resistant Staphylococcus aureus infection: Secondary | ICD-10-CM

## 2018-02-21 DIAGNOSIS — M109 Gout, unspecified: Secondary | ICD-10-CM | POA: Diagnosis not present

## 2018-02-21 DIAGNOSIS — J9601 Acute respiratory failure with hypoxia: Secondary | ICD-10-CM | POA: Diagnosis not present

## 2018-02-21 DIAGNOSIS — Z86010 Personal history of colonic polyps: Secondary | ICD-10-CM

## 2018-02-21 DIAGNOSIS — Z888 Allergy status to other drugs, medicaments and biological substances status: Secondary | ICD-10-CM | POA: Diagnosis not present

## 2018-02-21 DIAGNOSIS — I130 Hypertensive heart and chronic kidney disease with heart failure and stage 1 through stage 4 chronic kidney disease, or unspecified chronic kidney disease: Secondary | ICD-10-CM

## 2018-02-21 DIAGNOSIS — Z961 Presence of intraocular lens: Secondary | ICD-10-CM | POA: Diagnosis present

## 2018-02-21 DIAGNOSIS — Z881 Allergy status to other antibiotic agents status: Secondary | ICD-10-CM | POA: Diagnosis not present

## 2018-02-21 DIAGNOSIS — N186 End stage renal disease: Secondary | ICD-10-CM | POA: Diagnosis not present

## 2018-02-21 DIAGNOSIS — E1122 Type 2 diabetes mellitus with diabetic chronic kidney disease: Secondary | ICD-10-CM | POA: Insufficient documentation

## 2018-02-21 DIAGNOSIS — Z951 Presence of aortocoronary bypass graft: Secondary | ICD-10-CM

## 2018-02-21 DIAGNOSIS — Z79899 Other long term (current) drug therapy: Secondary | ICD-10-CM | POA: Insufficient documentation

## 2018-02-21 DIAGNOSIS — Z794 Long term (current) use of insulin: Secondary | ICD-10-CM

## 2018-02-21 DIAGNOSIS — I132 Hypertensive heart and chronic kidney disease with heart failure and with stage 5 chronic kidney disease, or end stage renal disease: Secondary | ICD-10-CM | POA: Diagnosis not present

## 2018-02-21 DIAGNOSIS — Z7902 Long term (current) use of antithrombotics/antiplatelets: Secondary | ICD-10-CM

## 2018-02-21 DIAGNOSIS — E1151 Type 2 diabetes mellitus with diabetic peripheral angiopathy without gangrene: Secondary | ICD-10-CM | POA: Diagnosis not present

## 2018-02-21 DIAGNOSIS — J810 Acute pulmonary edema: Secondary | ICD-10-CM | POA: Diagnosis not present

## 2018-02-21 DIAGNOSIS — N185 Chronic kidney disease, stage 5: Secondary | ICD-10-CM | POA: Diagnosis not present

## 2018-02-21 DIAGNOSIS — R600 Localized edema: Secondary | ICD-10-CM | POA: Diagnosis not present

## 2018-02-21 DIAGNOSIS — R7989 Other specified abnormal findings of blood chemistry: Secondary | ICD-10-CM | POA: Diagnosis not present

## 2018-02-21 DIAGNOSIS — I509 Heart failure, unspecified: Secondary | ICD-10-CM | POA: Diagnosis not present

## 2018-02-21 LAB — CBC
HEMATOCRIT: 30.1 % — AB (ref 39.0–52.0)
HEMOGLOBIN: 9.5 g/dL — AB (ref 13.0–17.0)
MCH: 28.7 pg (ref 26.0–34.0)
MCHC: 31.6 g/dL (ref 30.0–36.0)
MCV: 90.9 fL (ref 80.0–100.0)
Platelets: 421 10*3/uL — ABNORMAL HIGH (ref 150–400)
RBC: 3.31 MIL/uL — ABNORMAL LOW (ref 4.22–5.81)
RDW: 14.5 % (ref 11.5–15.5)
WBC: 10.5 10*3/uL (ref 4.0–10.5)
nRBC: 0 % (ref 0.0–0.2)

## 2018-02-21 LAB — BASIC METABOLIC PANEL
Anion gap: 11 (ref 5–15)
BUN: 50 mg/dL — AB (ref 8–23)
CHLORIDE: 105 mmol/L (ref 98–111)
CO2: 21 mmol/L — AB (ref 22–32)
CREATININE: 4.11 mg/dL — AB (ref 0.61–1.24)
Calcium: 8.5 mg/dL — ABNORMAL LOW (ref 8.9–10.3)
GFR calc Af Amer: 15 mL/min — ABNORMAL LOW (ref >60)
GFR calc non Af Amer: 13 mL/min — ABNORMAL LOW (ref >60)
Glucose, Bld: 185 mg/dL — ABNORMAL HIGH (ref 70–99)
Potassium: 4.5 mmol/L (ref 3.5–5.1)
Sodium: 137 mmol/L (ref 135–145)

## 2018-02-21 LAB — TROPONIN I: Troponin I: 0.06 ng/mL (ref <0.03)

## 2018-02-21 LAB — BRAIN NATRIURETIC PEPTIDE: B Natriuretic Peptide: 2237 pg/mL — ABNORMAL HIGH (ref 0.0–100.0)

## 2018-02-21 NOTE — ED Notes (Addendum)
Note entered in error per this RN.

## 2018-02-21 NOTE — Discharge Instructions (Addendum)
As we discussed please follow-up with Dr. Candiss Norse within the next several days.  They will be calling you to make this appointment, if you do not hear from them by tomorrow at lunchtime please call their office at the number provided.  Also given your abdominal discomfort please call the number provided for Dr. Gustavo Lah to arrange a follow-up appointment to discuss any further treatment that may or may not be necessary.  Return to the emergency department for any worsening shortness of breath development of any chest pain, or any other symptom personally concerning to yourself.

## 2018-02-21 NOTE — ED Provider Notes (Signed)
[facility] Emergency Department Provider Note  Time seen: 3:28 PM  I have reviewed the triage vital signs and the nursing notes.   HISTORY  Chief Complaint Shortness of Breath    HPI Thomas [name] is a 72 y.o. male with a past medical history of CAD, CKD stage IV, diabetes, gastric reflux, hypertension, hyperlipidemia, presents to the emergency department for shortness of breath.  According to the patient and daughter for the past 2 to 3 weeks patient has had progressive worsening shortness of breath.  Went to their primary care doctor 2 days ago had blood work performed and were called back today saying the GFR had decreased.  Patient is already had an AV fistula placed in left upper extremity but has not yet needed to start dialysis.  Follows up with Dr. Candiss Norse of nephrology.  Patient also states mild abdominal discomfort when coughing or trying to take deep breaths but this is also been ongoing for several weeks.  Patient does relate several of these complaints to starting a new medication Trulicity 2 months ago.  Although admits that the symptoms began 5 or 6 weeks after starting the medication.  Patient denies any vomiting or nausea.  Still producing normal amounts of urine.  Minimal lower extremity edema.  Continues to take furosemide.   Past Medical History:  Diagnosis Date  . Allergy   . Arthritis   . CAD (coronary artery disease)   . Chronic kidney disease   . Diabetes mellitus without complication ([place])   . Dyspnea   . Erosive esophagitis   . GERD (gastroesophageal reflux disease)   . Gouty arthropathy   . History of colonic polyps   . History of kidney stones   . History of MRSA infection   . Hyperkalemia   . Hyperlipidemia   . Hypertension   . Melanoma ([place])   . Myocardial infarction ([place]) 1986  . Peripheral vascular disease ([place])   . 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 Trigger finger of left hand   . Ureteral tumor      Patient Active Problem List   Diagnosis Date Noted  . PAD (peripheral artery disease) ([place]) 11/17/2017  . Chest pain 07/17/2017  . Emphysema lung ([place]) 07/17/2017  . Acute on chronic systolic CHF (congestive heart failure), NYHA class 2 ([place]) 06/22/2017  . CKD (chronic kidney disease), stage IV ([place]) 06/20/2017  . Systolic CHF ([place]) 56/31/4970  . Acute on chronic renal failure ([place]) 05/24/2017  . CAD (coronary artery disease) of artery bypass graft 10/25/2016  . Abnormal ankle brachial index (ABI) 10/13/2016  . Intermittent claudication ([place]) 09/30/2016  . PVC (premature ventricular contraction) 09/30/2016  . Hyperlipidemia 09/30/2016  . Aortic atherosclerosis ([place]) 06/17/2016  . Duodenitis 11/24/2014  . Erosive esophagitis 11/24/2014  . Personal history of methicillin resistant Staphylococcus aureus 11/24/2014  . High potassium 11/24/2014  . Melanoma of skin ([place]) 11/24/2014  . Acquired trigger finger 11/24/2014  . 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 DM (diabetes mellitus), type 2 with renal complications ([place]) 26/37/8588  . History of smoking 30 or more pack years 08/26/2009  . Allergic rhinitis 08/06/2006  . 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 History of colon polyps 05/09/2003  . 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 Essential (primary) hypertension 06/09/1995    Past Surgical History:  Procedure Laterality Date  . AV FISTULA PLACEMENT Left 12/07/2017   Procedure: ARTERIOVENOUS (AV)  FISTULA CREATION ( BRACHIOBASILIC STAGE );  Surgeon: Algernon Huxley, MD;  Location: [facility] ORS;  Service: Vascular;  Laterality: Left;  . CATARACT EXTRACTION    . CORONARY ARTERY BYPASS GRAFT  1996  . CORONARY STENT INTERVENTION N/A 05/29/2017   Procedure: CORONARY STENT INTERVENTION;  Surgeon: Isaias Cowman, MD;  Location: [place] CV LAB;  Service: Cardiovascular;  Laterality: N/A;  . EXCISION Ureteral Tumor     (benign)  Dr. Quillian Quince  . EYE SURGERY Bilateral    Cataract Extraction with IOL  . LEFT HEART CATH AND CORONARY ANGIOGRAPHY N/A 10/18/2016   Procedure: Left Heart Cath and Coronary Angiography;  Surgeon: Isaias Cowman, MD;  Location: [place] [facility] LAB;  Service: Cardiovascular;  Laterality: N/A;  . LEFT HEART CATH AND CORONARY ANGIOGRAPHY N/A 05/29/2017   Procedure: LEFT HEART CATH AND CORONARY ANGIOGRAPHY;  Surgeon: Isaias Cowman, MD;  Location: [place] [facility] LAB;  Service: Cardiovascular;  Laterality: N/A;  . LOWER EXTREMITY ANGIOGRAPHY Right 10/31/2016   Procedure: Lower Extremity Angiography;  Surgeon: Algernon Huxley, MD;  Location: [place] CV LAB;  Service: Cardiovascular;  Laterality: Right;  . LOWER EXTREMITY ANGIOGRAPHY Left 11/14/2016   Procedure: Lower Extremity Angiography;  Surgeon: Algernon Huxley, MD;  Location: [place] CV LAB;  Service: Cardiovascular;  Laterality: Left;  . LOWER EXTREMITY INTERVENTION  11/14/2016   Procedure: Lower Extremity Intervention;  Surgeon: Algernon Huxley, MD;  Location: [place] [facility] LAB;  Service: Cardiovascular;;  . RETINAL DETACHMENT SURGERY Right November 2107    Prior to Admission medications   Medication Sig Start Date End Date Taking? Authorizing Provider  acetaminophen (TYLENOL) 500 MG tablet Take 500 mg by mouth every 8 (eight) hours as needed for mild pain or headache.    [provider]  amLODipine (NORVASC) 2.5 MG tablet Take 1 tablet by mouth daily. 12/04/17   [provider]  amoxicillin-clavulanate (AUGMENTIN) 875-125 MG tablet Take 1 tablet by mouth 2 (two) times daily for 10 days. 02/20/18 03/02/18  Birdie Sons, MD  aspirin EC 81 MG tablet Take 81 mg by mouth daily.    [provider]  atorvastatin (LIPITOR) 80 MG tablet TAKE 1 TABLET BY MOUTH ONCE DAILY Patient taking differently: daily at 6 PM.  08/19/17   Birdie Sons, MD  b complex vitamins capsule Take 1 capsule by mouth daily.     [provider]  Blood Glucose Monitoring Suppl (ONE TOUCH ULTRA 2) w/Device KIT Use to check gluocose daily for type 2 diabetes 12/23/17   Birdie Sons, MD  buPROPion [facility] SR) 150 MG 12 hr tablet Take 1 tablet (150 mg total) by mouth 2 (two) times daily. 07/18/17   Loletha Grayer, MD  clopidogrel (PLAVIX) 75 MG tablet TAKE 1 TABLET BY MOUTH ONCE DAILY 01/09/18   Algernon Huxley, MD  colchicine 0.6 MG tablet Take 1 tablet (0.6 mg total) by mouth daily as needed (gout). 09/12/17   Birdie Sons, MD  diclofenac sodium (VOLTAREN) 1 % GEL Apply 2 g topically 4 (four) times daily as needed. 09/12/17   Birdie Sons, MD  doxycycline (VIBRA-TABS) 100 MG tablet Take 1 tablet (100 mg total) by mouth 2 (two) times daily. 02/19/18   Birdie Sons, MD  Dulaglutide (TRULICITY) 7.74 JO/8.7OM SOPN Inject 0.5 mLs as directed once a week. 01/24/18   Birdie Sons, MD  Ferrous Sulfate (IRON) 325 (65 Fe) MG TABS Take 325 mg by mouth 2 (two) times daily.  [provider]  furosemide (LASIX) 40 MG tablet Take 1 tablet (40 mg total) by mouth 2 (two) times daily. 07/18/17   Leslye Peer, Richard, MD  glipiZIDE (GLUCOTROL XL) 10 MG 24 hr tablet TAKE 1 TABLET BY MOUTH ONCE DAILY WITH BREAKFAST 01/09/18   Birdie Sons, MD  glucose blood test strip Contour Ascensia test strips and lancets Use to check blood sugar daily for type 2 diabetes, E11.9. 12/22/17   Birdie Sons, MD  isosorbide mononitrate (IMDUR) 60 MG 24 hr tablet TAKE 1 TABLET BY MOUTH ONCE DAILY. 01/08/18   Birdie Sons, MD  metoprolol succinate (TOPROL-XL) 50 MG 24 hr tablet TAKE 1 TABLET BY MOUTH ONCE DAILY WITH OR IMMEDIATELY FOLLOWING A MEAL. 01/08/18   Birdie Sons, MD  pantoprazole (PROTONIX) 40 MG tablet Take 1 tablet (40 mg total) by mouth daily. Patient taking differently: Take 40 mg by mouth 2 (two) times daily.  06/03/16   Birdie Sons, MD  sodium bicarbonate 650 MG tablet Take 650 mg by mouth 2 (two) times daily.     [provider]  tamsulosin (FLOMAX) 0.4 MG CAPS capsule TAKE 1 CAPSULE BY MOUTH ONCE DAILY AFTERSUPPER 01/16/18   Birdie Sons, MD  traMADol (ULTRAM) 50 MG tablet Take 1 tablet (50 mg total) by mouth every 6 (six) hours as needed. 12/07/17   Algernon Huxley, MD  vitamin B-12 (CYANOCOBALAMIN) 1000 MCG tablet Take 5,000 mcg by mouth daily.    [provider]    Allergies  Allergen Reactions  . Ciprofloxacin Itching and Swelling    Facial swelling   . Hydralazine Nausea And Vomiting    Family History  Problem Relation Age of Onset  . Alzheimer's disease Mother   . Alzheimer's disease Brother   . Lung cancer Paternal Grandfather     Social History Social History   Tobacco Use  . Smoking status: Former Smoker    Packs/day: 1.00    Years: 51.00    Pack years: 51.00    Types: Cigarettes    Last attempt to quit: 06/21/2017    Years since quitting: 0.6  . Smokeless tobacco: Never Used  . Tobacco comment: started smoking at age [age]. Smoked for 50 years  Substance Use Topics  . Alcohol use: Yes    Alcohol/week: 0.0 standard drinks    Frequency: Never    Comment: 1 every 6 months  . Drug use: No    Review of Systems Constitutional: Negative for fever. Cardiovascular: Negative for chest pain. Respiratory: Mild shortness of breath, worse with exertion Gastrointestinal: Negative for abdominal pain, vomiting and diarrhea. Genitourinary: Negative for urinary compaints Musculoskeletal: Minimal lower extremity edema Skin: Negative for skin complaints  Neurological: Negative for headache All other ROS negative  ____________________________________________   PHYSICAL EXAM:  Constitutional: Alert and oriented. Well appearing and in no distress. Eyes: Normal exam ENT   Head: Normocephalic and atraumatic.   Mouth/Throat: Mucous membranes are moist. Cardiovascular: Normal rate, regular rhythm. No murmur Respiratory: Normal respiratory effort without  tachypnea nor retractions. Breath sounds are clear  Gastrointestinal: Soft and nontender. No distention.  Musculoskeletal: Nontender with normal range of motion in all extremities.  Mild pedal edema equal bilaterally.  No calf tenderness. Neurologic:  Normal speech and language. No gross focal neurologic deficits  Skin:  Skin is warm, dry and intact.  Psychiatric: Mood and affect are normal.   ____________________________________________    EKG  EKG reviewed and interpreted by myself shows a sinus  rhythm at 96 bpm with a widened QRS, normal axis, largely normal intervals nonspecific ST changes.  Morphology most consistent with bifascicular block.  ____________________________________________    RADIOLOGY  Chest x-ray shows cardiomegaly with mild vascular congestion, largely stable from prior exam.  ____________________________________________   INITIAL IMPRESSION / ASSESSMENT AND PLAN / ED COURSE  Pertinent labs & imaging results that were available during my care of the patient were reviewed by me and considered in my medical decision making (see chart for details).  Patient presents to the emergency department for shortness of breath over the past 2 to 3 weeks progressively worsening.  Patient has stage IV kidney disease currently sees Dr. Candiss Norse of nephrology.  Differential would include CHF, pulmonary edema, fluid overload, pneumonia, ACS, pneumothorax.  Labs have resulted showing an elevated BNP of 2200 however compared to 8 months ago this is also stable.  GFR has decreased approximately one-point over the past 2 months, largely stable as well.  Troponin mildly elevated 0.06, history of a slightly elevated troponin at baseline.  Labs otherwise largely at baseline.  We will discuss the patient with Dr. Candiss Norse of nephrology for further recommendations.  I discussed the patient with Dr. Candiss Norse of nephrology.  He states patient appears to be stable, renal function appears stable.  Does  not recommend increasing the Lasix at this time.  He will have the patient follow-up within the next 1 week in his office.  Patient agreeable to this plan of care.  Patient also states he has been having some mild abdominal fullness, seems somewhat relieved by belching over the past 3 or 4 weeks, improves after he eats.  This would be highly suggestive of a gastric or peptic ulcer.  I discussed the patient follow-up with GI medicine.  Patient has followed up with Dr. Gustavo Lah in the past including 3 years ago when he had an endoscopy.  He is already on Protonix and will follow up with Dr. Gustavo Lah.  I discussed the use of over-the-counter Maalox if needed for discomfort.  Patient agreeable to plan of care. ____________________________________________   FINAL CLINICAL IMPRESSION(S) / ED DIAGNOSES  Dyspnea    Harvest Dark, MD 02/21/18 [phone]

## 2018-02-21 NOTE — Telephone Encounter (Signed)
I spoke with Mrs. Mayorga about the lab results.  She states Mr. Hy is not feeling well today and has worsening some.  She says she is going to take him to the ER to have him evaluated.    Thanks,   -Mickel Baas

## 2018-02-21 NOTE — ED Notes (Signed)
Pt presents to ED with c/c of shortness of breath and chest pain. See Triage note for details. Pt Dx with renal failure and has new fistula placed in L arm. At time of in room assesment, Pt appears to be breathing easily and presents O2 sats of 97-98% on RA during interview. Pt states he is convinced that current renal Sx began when he was placed on Trulicity. Dr Kerman Passey at bedside and aware of Pts stated correlation between trulicity and current condition.

## 2018-02-21 NOTE — ED Triage Notes (Addendum)
Pt in via POV with complaints of ongoing shortness of breath x approximately two weeks, seeing PCP on Monday, receiving call back today to be seen in ED due to decreased kidney function; GFR down 2 points and BNP elevated.  Pt with stage 4 kidney disease, fistula to left arm, has not yet begun dialysis.  Vitals WDL, NAD noted at this time.

## 2018-02-21 NOTE — Telephone Encounter (Signed)
-----   Message from Birdie Sons, MD sent at 02/21/2018  7:46 AM EST ----- BNP is elevated suggestive or heart failure. Continue taking furosemide twice every day Kidney functions are worse. Need to reduce Augmentin to one tablet every OTHER day due to poor kidney functions.  Stay off of Trulicity for now. Follow up on Friday.

## 2018-02-22 NOTE — Telephone Encounter (Signed)
I called and spoke with patient and his daughter. Patient states his breathing has slightly improved since being seen in the ER. I advised patient that the Augmentin that was prescribed Wednesday for twice a day should only be taken every other day since his kidney functions were so low. Patient and his daughter stated that the ER doctor reviewed his labs and told them that his kidney functions were low, but stable for him (considering his CKD). Patient says that the ER doctor left it up to him wether he wanted to continue to take the Augmentin as prescribed or not. Patient already had a follow up appointment scheduled with [place] on Monday 02/26/2018 at  8:40am. Is this time ok?

## 2018-02-22 NOTE — Telephone Encounter (Signed)
Patient was seen in ER yesterday for shortness of breath. Please check with patient to see how he is today. Was prescribed Augmentin BID on Wednesday for bronchitis, but he can only take it every other day since his kidney functions were so low.  Need to schedule follow up o.v. About 1 week

## 2018-02-24 ENCOUNTER — Emergency Department: Payer: Medicare Other

## 2018-02-24 ENCOUNTER — Encounter: Payer: Self-pay | Admitting: Emergency Medicine

## 2018-02-24 ENCOUNTER — Other Ambulatory Visit: Payer: Self-pay

## 2018-02-24 ENCOUNTER — Inpatient Hospital Stay
Admission: EM | Admit: 2018-02-24 | Discharge: 2018-02-25 | DRG: 291 | Disposition: A | Discharge status: Home / Self Care | Payer: Medicare Other | Attending: Family Medicine | Admitting: Family Medicine

## 2018-02-24 DIAGNOSIS — Z85828 Personal history of other malignant neoplasm of skin: Secondary | ICD-10-CM | POA: Diagnosis not present

## 2018-02-24 DIAGNOSIS — K219 Gastro-esophageal reflux disease without esophagitis: Secondary | ICD-10-CM | POA: Diagnosis present

## 2018-02-24 DIAGNOSIS — J9601 Acute respiratory failure with hypoxia: Secondary | ICD-10-CM | POA: Diagnosis present

## 2018-02-24 DIAGNOSIS — I132 Hypertensive heart and chronic kidney disease with heart failure and with stage 5 chronic kidney disease, or end stage renal disease: Secondary | ICD-10-CM | POA: Diagnosis present

## 2018-02-24 DIAGNOSIS — N189 Chronic kidney disease, unspecified: Secondary | ICD-10-CM

## 2018-02-24 DIAGNOSIS — E1122 Type 2 diabetes mellitus with diabetic chronic kidney disease: Secondary | ICD-10-CM | POA: Diagnosis present

## 2018-02-24 DIAGNOSIS — Z9849 Cataract extraction status, unspecified eye: Secondary | ICD-10-CM | POA: Diagnosis not present

## 2018-02-24 DIAGNOSIS — I5043 Acute on chronic combined systolic (congestive) and diastolic (congestive) heart failure: Secondary | ICD-10-CM | POA: Diagnosis present

## 2018-02-24 DIAGNOSIS — N185 Chronic kidney disease, stage 5: Secondary | ICD-10-CM | POA: Diagnosis present

## 2018-02-24 DIAGNOSIS — Z951 Presence of aortocoronary bypass graft: Secondary | ICD-10-CM | POA: Diagnosis not present

## 2018-02-24 DIAGNOSIS — Z87891 Personal history of nicotine dependence: Secondary | ICD-10-CM | POA: Diagnosis not present

## 2018-02-24 DIAGNOSIS — I248 Other forms of acute ischemic heart disease: Secondary | ICD-10-CM | POA: Diagnosis present

## 2018-02-24 DIAGNOSIS — Z794 Long term (current) use of insulin: Secondary | ICD-10-CM | POA: Diagnosis not present

## 2018-02-24 DIAGNOSIS — R778 Other specified abnormalities of plasma proteins: Secondary | ICD-10-CM

## 2018-02-24 DIAGNOSIS — M109 Gout, unspecified: Secondary | ICD-10-CM | POA: Diagnosis present

## 2018-02-24 DIAGNOSIS — E1151 Type 2 diabetes mellitus with diabetic peripheral angiopathy without gangrene: Secondary | ICD-10-CM | POA: Diagnosis present

## 2018-02-24 DIAGNOSIS — I252 Old myocardial infarction: Secondary | ICD-10-CM | POA: Diagnosis not present

## 2018-02-24 DIAGNOSIS — Z820 Family history of epilepsy and other diseases of the nervous system: Secondary | ICD-10-CM | POA: Diagnosis not present

## 2018-02-24 DIAGNOSIS — R7989 Other specified abnormal findings of blood chemistry: Secondary | ICD-10-CM

## 2018-02-24 DIAGNOSIS — Z801 Family history of malignant neoplasm of trachea, bronchus and lung: Secondary | ICD-10-CM | POA: Diagnosis not present

## 2018-02-24 DIAGNOSIS — Z8614 Personal history of Methicillin resistant Staphylococcus aureus infection: Secondary | ICD-10-CM | POA: Diagnosis not present

## 2018-02-24 DIAGNOSIS — I2510 Atherosclerotic heart disease of native coronary artery without angina pectoris: Secondary | ICD-10-CM | POA: Diagnosis present

## 2018-02-24 DIAGNOSIS — Z86010 Personal history of colonic polyps: Secondary | ICD-10-CM | POA: Diagnosis not present

## 2018-02-24 DIAGNOSIS — R0602 Shortness of breath: Secondary | ICD-10-CM

## 2018-02-24 DIAGNOSIS — Z881 Allergy status to other antibiotic agents status: Secondary | ICD-10-CM | POA: Diagnosis not present

## 2018-02-24 DIAGNOSIS — M1990 Unspecified osteoarthritis, unspecified site: Secondary | ICD-10-CM | POA: Diagnosis present

## 2018-02-24 DIAGNOSIS — I509 Heart failure, unspecified: Secondary | ICD-10-CM

## 2018-02-24 DIAGNOSIS — Z87442 Personal history of urinary calculi: Secondary | ICD-10-CM | POA: Diagnosis not present

## 2018-02-24 DIAGNOSIS — J9600 Acute respiratory failure, unspecified whether with hypoxia or hypercapnia: Secondary | ICD-10-CM | POA: Diagnosis present

## 2018-02-24 DIAGNOSIS — Z888 Allergy status to other drugs, medicaments and biological substances status: Secondary | ICD-10-CM | POA: Diagnosis not present

## 2018-02-24 LAB — CBC WITH DIFFERENTIAL/PLATELET
ABS IMMATURE GRANULOCYTES: 0.04 10*3/uL (ref 0.00–0.07)
Basophils Absolute: 0.1 10*3/uL (ref 0.0–0.1)
Basophils Relative: 1 %
Eosinophils Absolute: 0.7 10*3/uL — ABNORMAL HIGH (ref 0.0–0.5)
Eosinophils Relative: 6 %
HCT: 31.3 % — ABNORMAL LOW (ref 39.0–52.0)
HEMOGLOBIN: 9.6 g/dL — AB (ref 13.0–17.0)
Immature Granulocytes: 0 %
LYMPHS ABS: 1.7 10*3/uL (ref 0.7–4.0)
LYMPHS PCT: 17 %
MCH: 28.5 pg (ref 26.0–34.0)
MCHC: 30.7 g/dL (ref 30.0–36.0)
MCV: 92.9 fL (ref 80.0–100.0)
Monocytes Absolute: 1 10*3/uL (ref 0.1–1.0)
Monocytes Relative: 10 %
NEUTROS ABS: 6.8 10*3/uL (ref 1.7–7.7)
Neutrophils Relative %: 66 %
Platelets: 452 10*3/uL — ABNORMAL HIGH (ref 150–400)
RBC: 3.37 MIL/uL — AB (ref 4.22–5.81)
RDW: 14.5 % (ref 11.5–15.5)
WBC: 10.3 10*3/uL (ref 4.0–10.5)
nRBC: 0 % (ref 0.0–0.2)

## 2018-02-24 LAB — GLUCOSE, CAPILLARY
GLUCOSE-CAPILLARY: 143 mg/dL — AB (ref 70–99)
GLUCOSE-CAPILLARY: 191 mg/dL — AB (ref 70–99)
Glucose-Capillary: 189 mg/dL — ABNORMAL HIGH (ref 70–99)
Glucose-Capillary: 129 mg/dL — ABNORMAL HIGH (ref 70–99)

## 2018-02-24 LAB — BASIC METABOLIC PANEL
ANION GAP: 9 (ref 5–15)
BUN: 52 mg/dL — ABNORMAL HIGH (ref 8–23)
CHLORIDE: 108 mmol/L (ref 98–111)
CO2: 21 mmol/L — AB (ref 22–32)
CREATININE: 4.01 mg/dL — AB (ref 0.61–1.24)
Calcium: 8.4 mg/dL — ABNORMAL LOW (ref 8.9–10.3)
GFR calc Af Amer: 16 mL/min — ABNORMAL LOW (ref >60)
GFR calc non Af Amer: 13 mL/min — ABNORMAL LOW (ref >60)
GLUCOSE: 178 mg/dL — AB (ref 70–99)
Potassium: 5 mmol/L (ref 3.5–5.1)
Sodium: 138 mmol/L (ref 135–145)

## 2018-02-24 LAB — COMPREHENSIVE METABOLIC PANEL
ALK PHOS: 117 U/L (ref 38–126)
ALT: 13 U/L (ref 0–44)
AST: 15 U/L (ref 15–41)
Albumin: 2.8 g/dL — ABNORMAL LOW (ref 3.5–5.0)
Anion gap: 10 (ref 5–15)
BILIRUBIN TOTAL: 0.5 mg/dL (ref 0.3–1.2)
BUN: 50 mg/dL — AB (ref 8–23)
CALCIUM: 8.4 mg/dL — AB (ref 8.9–10.3)
CO2: 23 mmol/L (ref 22–32)
CREATININE: 4.01 mg/dL — AB (ref 0.61–1.24)
Chloride: 104 mmol/L (ref 98–111)
GFR calc Af Amer: 16 mL/min — ABNORMAL LOW (ref >60)
GFR calc non Af Amer: 13 mL/min — ABNORMAL LOW (ref >60)
GLUCOSE: 232 mg/dL — AB (ref 70–99)
POTASSIUM: 5 mmol/L (ref 3.5–5.1)
Sodium: 137 mmol/L (ref 135–145)
TOTAL PROTEIN: 6.5 g/dL (ref 6.5–8.1)

## 2018-02-24 LAB — TROPONIN I: Troponin I: 0.1 ng/mL (ref <0.03)

## 2018-02-24 LAB — CBC
HEMATOCRIT: 30.2 % — AB (ref 39.0–52.0)
Hemoglobin: 9.5 g/dL — ABNORMAL LOW (ref 13.0–17.0)
MCH: 28.3 pg (ref 26.0–34.0)
MCHC: 31.5 g/dL (ref 30.0–36.0)
MCV: 89.9 fL (ref 80.0–100.0)
NRBC: 0 % (ref 0.0–0.2)
Platelets: 418 10*3/uL — ABNORMAL HIGH (ref 150–400)
RBC: 3.36 MIL/uL — AB (ref 4.22–5.81)
RDW: 14.4 % (ref 11.5–15.5)
WBC: 11.5 10*3/uL — ABNORMAL HIGH (ref 4.0–10.5)

## 2018-02-24 LAB — PROTIME-INR
INR: 1.09
PROTHROMBIN TIME: 14 s (ref 11.4–15.2)

## 2018-02-24 LAB — BRAIN NATRIURETIC PEPTIDE: B Natriuretic Peptide: 3410 pg/mL — ABNORMAL HIGH (ref 0.0–100.0)

## 2018-02-24 LAB — LACTIC ACID, PLASMA: Lactic Acid, Venous: 1.4 mmol/L (ref 0.5–1.9)

## 2018-02-24 MED ORDER — BUPROPION HCL ER (SR) 150 MG PO TB12: 150.0000 mg | ORAL_TABLET | Freq: Two times a day (BID) | ORAL | Status: DC

## 2018-02-24 MED ORDER — ONDANSETRON HCL 4 MG PO TABS: 4.0000 mg | ORAL_TABLET | Freq: Four times a day (QID) | ORAL | Status: DC | PRN

## 2018-02-24 MED ORDER — HEPARIN SODIUM (PORCINE) 5000 UNIT/ML IJ SOLN
5000.0000 [IU] | Freq: Three times a day (TID) | INTRAMUSCULAR | Status: DC
  Administered 2018-02-24 – 2018-02-25 (×4): 5000 [IU] via SUBCUTANEOUS
  Filled 2018-02-24 (×4): qty 1

## 2018-02-24 MED ORDER — TRAZODONE HCL 50 MG PO TABS
25.0000 mg | ORAL_TABLET | Freq: Every evening | ORAL | Status: DC | PRN
  Administered 2018-02-24: 25 mg via ORAL
  Filled 2018-02-24: qty 1

## 2018-02-24 MED ORDER — AMLODIPINE BESYLATE 5 MG PO TABS
2.5000 mg | ORAL_TABLET | Freq: Every day | ORAL | Status: DC
  Administered 2018-02-24 – 2018-02-25 (×2): 2.5 mg via ORAL
  Filled 2018-02-24 (×2): qty 1

## 2018-02-24 MED ORDER — FUROSEMIDE 10 MG/ML IJ SOLN
80.0000 mg | Freq: Once | INTRAMUSCULAR | Status: AC
  Administered 2018-02-24: 80 mg via INTRAVENOUS
  Filled 2018-02-24: qty 8

## 2018-02-24 MED ORDER — VITAMIN B-12 1000 MCG PO TABS: 5000.0000 ug | ORAL_TABLET | Freq: Every day | ORAL | Status: DC

## 2018-02-24 MED ORDER — ASPIRIN EC 81 MG PO TBEC
81.0000 mg | DELAYED_RELEASE_TABLET | Freq: Every day | ORAL | Status: DC
  Administered 2018-02-24 – 2018-02-25 (×2): 81 mg via ORAL
  Filled 2018-02-24 (×2): qty 1

## 2018-02-24 MED ORDER — METOPROLOL SUCCINATE ER 50 MG PO TB24
50.0000 mg | ORAL_TABLET | Freq: Every day | ORAL | Status: DC
  Administered 2018-02-24 – 2018-02-25 (×2): 50 mg via ORAL
  Filled 2018-02-24 (×2): qty 1

## 2018-02-24 MED ORDER — DOCUSATE SODIUM 100 MG PO CAPS
100.0000 mg | ORAL_CAPSULE | Freq: Two times a day (BID) | ORAL | Status: DC
  Administered 2018-02-24 – 2018-02-25 (×3): 100 mg via ORAL
  Filled 2018-02-24 (×3): qty 1

## 2018-02-24 MED ORDER — PANTOPRAZOLE SODIUM 40 MG PO TBEC
40.0000 mg | DELAYED_RELEASE_TABLET | Freq: Two times a day (BID) | ORAL | Status: DC
  Administered 2018-02-24 – 2018-02-25 (×3): 40 mg via ORAL
  Filled 2018-02-24 (×3): qty 1

## 2018-02-24 MED ORDER — HYDROCODONE-ACETAMINOPHEN 5-325 MG PO TABS: 1.0000 | ORAL_TABLET | ORAL | Status: DC | PRN

## 2018-02-24 MED ORDER — FERROUS SULFATE 325 (65 FE) MG PO TABS
325.0000 mg | ORAL_TABLET | Freq: Two times a day (BID) | ORAL | Status: DC
  Administered 2018-02-24 – 2018-02-25 (×3): 325 mg via ORAL
  Filled 2018-02-24 (×3): qty 1

## 2018-02-24 MED ORDER — FUROSEMIDE 10 MG/ML IJ SOLN
60.0000 mg | Freq: Two times a day (BID) | INTRAMUSCULAR | Status: DC
  Administered 2018-02-24 – 2018-02-25 (×3): 60 mg via INTRAVENOUS
  Filled 2018-02-24 (×3): qty 8

## 2018-02-24 MED ORDER — BISACODYL 5 MG PO TBEC: 5.0000 mg | DELAYED_RELEASE_TABLET | Freq: Every day | ORAL | Status: DC | PRN

## 2018-02-24 MED ORDER — SODIUM BICARBONATE 650 MG PO TABS
650.0000 mg | ORAL_TABLET | Freq: Two times a day (BID) | ORAL | Status: DC
  Administered 2018-02-24 – 2018-02-25 (×3): 650 mg via ORAL
  Filled 2018-02-24 (×4): qty 1

## 2018-02-24 MED ORDER — ACETAMINOPHEN 650 MG RE SUPP: 650.0000 mg | Freq: Four times a day (QID) | RECTAL | Status: DC | PRN

## 2018-02-24 MED ORDER — ATORVASTATIN CALCIUM 20 MG PO TABS
80.0000 mg | ORAL_TABLET | Freq: Every day | ORAL | Status: DC
  Administered 2018-02-24: 80 mg via ORAL
  Filled 2018-02-24: qty 4

## 2018-02-24 MED ORDER — B COMPLEX-C PO TABS
1.0000 | ORAL_TABLET | Freq: Every day | ORAL | Status: DC
  Administered 2018-02-24 – 2018-02-25 (×2): 1 via ORAL
  Filled 2018-02-24 (×2): qty 1

## 2018-02-24 MED ORDER — HYDROCORTISONE 1 % EX CREA
1.0000 "application " | TOPICAL_CREAM | Freq: Three times a day (TID) | CUTANEOUS | Status: DC | PRN
  Administered 2018-02-24: 1 via TOPICAL
  Filled 2018-02-24: qty 28

## 2018-02-24 MED ORDER — ACETAMINOPHEN 325 MG PO TABS: 650.0000 mg | ORAL_TABLET | Freq: Four times a day (QID) | ORAL | Status: DC | PRN

## 2018-02-24 MED ORDER — INSULIN ASPART 100 UNIT/ML ~~LOC~~ SOLN: 0.0000 [IU] | Freq: Every day | SUBCUTANEOUS | Status: DC

## 2018-02-24 MED ORDER — ONDANSETRON HCL 4 MG/2ML IJ SOLN
4.0000 mg | Freq: Four times a day (QID) | INTRAMUSCULAR | Status: DC | PRN
  Administered 2018-02-24: 4 mg via INTRAVENOUS
  Filled 2018-02-24: qty 2

## 2018-02-24 MED ORDER — INSULIN ASPART 100 UNIT/ML ~~LOC~~ SOLN
0.0000 [IU] | Freq: Three times a day (TID) | SUBCUTANEOUS | Status: DC
  Administered 2018-02-24 (×2): 1 [IU] via SUBCUTANEOUS
  Administered 2018-02-24: 2 [IU] via SUBCUTANEOUS
  Filled 2018-02-24 (×3): qty 1

## 2018-02-24 MED ORDER — ISOSORBIDE MONONITRATE ER 30 MG PO TB24
60.0000 mg | ORAL_TABLET | Freq: Every day | ORAL | Status: DC
  Administered 2018-02-24 – 2018-02-25 (×2): 60 mg via ORAL
  Filled 2018-02-24 (×2): qty 2

## 2018-02-24 MED ORDER — TRAMADOL HCL 50 MG PO TABS: 50.0000 mg | ORAL_TABLET | Freq: Two times a day (BID) | ORAL | Status: DC | PRN

## 2018-02-24 MED ORDER — TAMSULOSIN HCL 0.4 MG PO CAPS
0.4000 mg | ORAL_CAPSULE | Freq: Every day | ORAL | Status: DC
  Administered 2018-02-24: 0.4 mg via ORAL
  Filled 2018-02-24 (×2): qty 1

## 2018-02-24 MED ORDER — PROMETHAZINE HCL 25 MG PO TABS
12.5000 mg | ORAL_TABLET | Freq: Four times a day (QID) | ORAL | Status: DC | PRN
  Filled 2018-02-24: qty 1

## 2018-02-24 MED ORDER — CLOPIDOGREL BISULFATE 75 MG PO TABS
75.0000 mg | ORAL_TABLET | Freq: Every day | ORAL | Status: DC
  Administered 2018-02-24 – 2018-02-25 (×2): 75 mg via ORAL
  Filled 2018-02-24 (×2): qty 1

## 2018-02-24 NOTE — ED Notes (Signed)
EKG completed and given to EDP in person.

## 2018-02-24 NOTE — ED Provider Notes (Signed)
[facility] Emergency Department Provider Note  ____________________________________________   First MD Initiated Contact with Patient 02/24/18 0231     (approximate)  I have reviewed the triage vital signs and the nursing notes.   HISTORY  Chief Complaint Shortness of Breath    HPI KALADIN NOSEWORTHY is a 72 y.o. male with medical history as listed below who sees Dr. Candiss Norse for nephrology and Dr. Saralyn Pilar for cardiology.  He presents by private vehicle with his family for evaluation of gradually worsening shortness of breath for the last several days.  He was seen in this emergency department about 3 days ago for similar symptoms.  The case was discussed with Dr. Candiss Norse by the ED attending and the decision was made to treat him as an outpatient with Lasix and continue diuresis.  He has a dialysis fistula in his left upper extremity but is not yet on dialysis but they are trying to decide when to start; reportedly the target GFR is 9 before they will begin dialysis.  The patient reports that in spite of taking 40 mg of Lasix twice a day for the last 3 days he is gradually gotten worse.  He is extremely short of breath with any amount of exertion and as of tonight, lying flat also causes him to be increasingly short of breath.  He feels like he cannot take a deep breath and his abdomen is starting to hurt as well and he says that he feels full and distended.  He is not having any pain.  He denies fever/chills, chest pain, nausea, vomiting.  He reports decreased urination but still does urinate and is not having any dysuria.  He and his family describe the symptoms as severe.  Past Medical History:  Diagnosis Date  . Allergy   . Arthritis   . CAD (coronary artery disease)   . Chronic kidney disease   . Diabetes mellitus without complication ([place])   . Dyspnea   . Erosive esophagitis   . GERD (gastroesophageal reflux disease)   . Gouty arthropathy   . History of  colonic polyps   . History of kidney stones   . History of MRSA infection   . Hyperkalemia   . Hyperlipidemia   . Hypertension   . Melanoma ([place])   . Myocardial infarction ([place]) 1986  . Peripheral vascular disease ([place])   . 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 Trigger finger of left hand   . Ureteral tumor     Patient Active Problem List   Diagnosis Date Noted  . Acute respiratory failure ([place]) 02/24/2018  . PAD (peripheral artery disease) ([place]) 11/17/2017  . Chest pain 07/17/2017  . Emphysema lung ([place]) 07/17/2017  . Acute on chronic systolic CHF (congestive heart failure), NYHA class 2 ([place]) 06/22/2017  . CKD (chronic kidney disease), stage IV ([place]) 06/20/2017  . Systolic CHF ([place]) 25/63/8937  . Acute on chronic renal failure ([place]) 05/24/2017  . CAD (coronary artery disease) of artery bypass graft 10/25/2016  . Abnormal ankle brachial index (ABI) 10/13/2016  . Intermittent claudication ([place]) 09/30/2016  . PVC (premature ventricular contraction) 09/30/2016  . Hyperlipidemia 09/30/2016  . Aortic atherosclerosis ([place]) 06/17/2016  . Duodenitis 11/24/2014  . Erosive esophagitis 11/24/2014  . Personal history of methicillin resistant Staphylococcus aureus 11/24/2014  . High potassium 11/24/2014  . Melanoma of skin ([place]) 11/24/2014  . Acquired trigger finger 11/24/2014  . 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  DM (diabetes mellitus), type 2 with renal complications ([place]) 62/37/6283  . History of smoking 30 or more pack years 08/26/2009  . Allergic rhinitis 08/06/2006  . 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 History of colon polyps 05/09/2003  . 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 Essential (primary) hypertension 06/09/1995    Past Surgical History:  Procedure Laterality Date  . AV FISTULA PLACEMENT Left 12/07/2017   Procedure: ARTERIOVENOUS (AV) FISTULA CREATION ( BRACHIOBASILIC STAGE );  Surgeon:  Algernon Huxley, MD;  Location: [facility] ORS;  Service: Vascular;  Laterality: Left;  . CATARACT EXTRACTION    . CORONARY ARTERY BYPASS GRAFT  1996  . CORONARY STENT INTERVENTION N/A 05/29/2017   Procedure: CORONARY STENT INTERVENTION;  Surgeon: Isaias Cowman, MD;  Location: [place] [facility];  Service: Cardiovascular;  Laterality: N/A;  . EXCISION Ureteral Tumor     (benign) Dr. Quillian Quince  . EYE SURGERY Bilateral    Cataract Extraction with IOL  . LEFT HEART CATH AND CORONARY ANGIOGRAPHY N/A 10/18/2016   Procedure: Left Heart Cath and Coronary Angiography;  Surgeon: Isaias Cowman, MD;  Location: [place] [facility];  Service: Cardiovascular;  Laterality: N/A;  . LEFT HEART CATH AND CORONARY ANGIOGRAPHY N/A 05/29/2017   Procedure: LEFT HEART CATH AND CORONARY ANGIOGRAPHY;  Surgeon: Isaias Cowman, MD;  Location: [place] [facility];  Service: Cardiovascular;  Laterality: N/A;  . LOWER EXTREMITY ANGIOGRAPHY Right 10/31/2016   Procedure: Lower Extremity Angiography;  Surgeon: Algernon Huxley, MD;  Location: [place] [facility];  Service: Cardiovascular;  Laterality: Right;  . LOWER EXTREMITY ANGIOGRAPHY Left 11/14/2016   Procedure: Lower Extremity Angiography;  Surgeon: Algernon Huxley, MD;  Location: [place] [facility];  Service: Cardiovascular;  Laterality: Left;  . LOWER EXTREMITY INTERVENTION  11/14/2016   Procedure: Lower Extremity Intervention;  Surgeon: Algernon Huxley, MD;  Location: [place] [facility];  Service: Cardiovascular;;  . RETINAL DETACHMENT SURGERY Right November 2107    Prior to Admission medications   Medication Sig Start Date End Date Taking? Authorizing Provider  acetaminophen (TYLENOL) 500 MG tablet Take 500 mg by mouth every 8 (eight) hours as needed for mild pain or headache.   Yes [provider]  amLODipine (NORVASC) 2.5 MG tablet Take 1 tablet by mouth daily. 12/04/17  Yes [provider]  amoxicillin-clavulanate (AUGMENTIN) 875-125 MG tablet  Take 1 tablet by mouth 2 (two) times daily for 10 days. 02/20/18 03/02/18 Yes Birdie Sons, MD  aspirin EC 81 MG tablet Take 81 mg by mouth daily.   Yes [provider]  atorvastatin (LIPITOR) 80 MG tablet TAKE 1 TABLET BY MOUTH ONCE DAILY Patient taking differently: daily at 6 PM.  08/19/17  Yes Fisher, Kirstie Peri, MD  clopidogrel (PLAVIX) 75 MG tablet TAKE 1 TABLET BY MOUTH ONCE DAILY 01/09/18  Yes Dew, Erskine Squibb, MD  furosemide (LASIX) 40 MG tablet Take 1 tablet (40 mg total) by mouth 2 (two) times daily. 07/18/17  Yes Wieting, Richard, MD  glipiZIDE (GLUCOTROL XL) 10 MG 24 hr tablet TAKE 1 TABLET BY MOUTH ONCE DAILY WITH BREAKFAST 01/09/18  Yes Birdie Sons, MD  isosorbide mononitrate (IMDUR) 60 MG 24 hr tablet TAKE 1 TABLET BY MOUTH ONCE DAILY. 01/08/18  Yes Birdie Sons, MD  metoprolol succinate (TOPROL-XL) 50 MG 24 hr tablet TAKE 1 TABLET BY MOUTH ONCE DAILY WITH OR IMMEDIATELY FOLLOWING A MEAL. 01/08/18  Yes Birdie Sons, MD  pantoprazole (  PROTONIX) 40 MG tablet Take 1 tablet (40 mg total) by mouth daily. Patient taking differently: Take 40 mg by mouth 2 (two) times daily.  06/03/16  Yes Birdie Sons, MD  sodium bicarbonate 650 MG tablet Take 650 mg by mouth 2 (two) times daily.   Yes [provider]  tamsulosin (FLOMAX) 0.4 MG CAPS capsule TAKE 1 CAPSULE BY MOUTH ONCE DAILY AFTERSUPPER 01/16/18  Yes Birdie Sons, MD  traMADol (ULTRAM) 50 MG tablet Take 1 tablet (50 mg total) by mouth every 6 (six) hours as needed. 12/07/17  Yes Algernon Huxley, MD  b complex vitamins capsule Take 1 capsule by mouth daily.    [provider]  Blood Glucose Monitoring Suppl (ONE TOUCH ULTRA 2) w/Device KIT Use to check gluocose daily for type 2 diabetes 12/23/17   Birdie Sons, MD  buPROPion [facility] SR) 150 MG 12 hr tablet Take 1 tablet (150 mg total) by mouth 2 (two) times daily. Patient not taking: Reported on 02/24/2018 07/18/17   Loletha Grayer, MD  colchicine 0.6 MG  tablet Take 1 tablet (0.6 mg total) by mouth daily as needed (gout). Patient not taking: Reported on 02/24/2018 09/12/17   Birdie Sons, MD  diclofenac sodium (VOLTAREN) 1 % GEL Apply 2 g topically 4 (four) times daily as needed. Patient not taking: Reported on 02/24/2018 09/12/17   Birdie Sons, MD  doxycycline (VIBRA-TABS) 100 MG tablet Take 1 tablet (100 mg total) by mouth 2 (two) times daily. Patient not taking: Reported on 02/24/2018 02/19/18   Birdie Sons, MD  Dulaglutide (TRULICITY) 9.32 IZ/1.2WP SOPN Inject 0.5 mLs as directed once a week. Patient not taking: Reported on 02/24/2018 01/24/18   Birdie Sons, MD  Ferrous Sulfate (IRON) 325 (65 Fe) MG TABS Take 325 mg by mouth 2 (two) times daily.    [provider]  glucose blood test strip Contour [employer]ia test strips and lancets Use to check blood sugar daily for type 2 diabetes, E11.9. 12/22/17   Birdie Sons, MD  vitamin B-12 (CYANOCOBALAMIN) 1000 MCG tablet Take 5,000 mcg by mouth daily.    [provider]    Allergies Ciprofloxacin and Hydralazine  Family History  Problem Relation Age of Onset  . Alzheimer's disease Mother   . Alzheimer's disease Brother   . Lung cancer Paternal Grandfather     Social History Social History   Tobacco Use  . Smoking status: Former Smoker    Packs/day: 1.00    Years: 51.00    Pack years: 51.00    Types: Cigarettes    Last attempt to quit: 06/21/2017    Years since quitting: 0.6  . Smokeless tobacco: Never Used  . Tobacco comment: started smoking at age [age]. Smoked for 50 years  Substance Use Topics  . Alcohol use: Yes    Alcohol/week: 0.0 standard drinks    Frequency: Never    Comment: 1 every 6 months  . Drug use: No    Review of Systems Constitutional: No fever/chills Eyes: No visual changes. ENT: No sore throat. Cardiovascular: Denies chest pain. Respiratory: Shortness of breath as described above Gastrointestinal: No abdominal pain.  No  nausea, no vomiting.  No diarrhea.  No constipation. Genitourinary: Negative for dysuria. Musculoskeletal: Negative for neck pain.  Negative for back pain. Integumentary: Negative for rash. Neurological: Negative for headaches, focal weakness or numbness.   ____________________________________________   PHYSICAL EXAM:  VITAL SIGNS: ED Triage Vitals  Enc Vitals Group  BP 02/24/18 0159 (!) 148/73     Pulse Rate 02/24/18 0159 91     Resp 02/24/18 0159 (!) 24     Temp 02/24/18 0159 (!) 97.5 F (36.4 C)     Temp Source 02/24/18 0159 Oral     SpO2 02/24/18 0159 99 %     Weight 02/24/18 0200 75.3 kg (166 lb)     Height 02/24/18 0200 1.676 m (5' 6" )     Head Circumference --      Peak Flow --      Pain Score 02/24/18 0200 6     Pain Loc --      Pain Edu? --      Excl. in [place]? --     Constitutional: Alert and oriented.  Mild to moderate shortness of breath. Eyes: Conjunctivae are normal.  Head: Atraumatic. Nose: No congestion/rhinnorhea. Mouth/Throat: Mucous membranes are moist. Neck: No stridor.  No meningeal signs.   Cardiovascular: Normal rate, regular rhythm. Good peripheral circulation. Grossly normal heart sounds. Respiratory: Increased respiratory effort with some intercostal muscle retractions.  Coarse breath sounds in the bases.  No wheezing. Gastrointestinal: Soft and nontender. No distention.  Musculoskeletal: Trace pitting edema in bilateral lower extremities but nothing substantial. No gross deformities of extremities. Neurologic:  Normal speech and language. No gross focal neurologic deficits are appreciated.  Skin:  Skin is warm, dry and intact. No rash noted. Psychiatric: Mood and affect are normal. Speech and behavior are normal.  ____________________________________________   LABS (all labs ordered are listed, but only abnormal results are displayed)  Labs Reviewed  COMPREHENSIVE METABOLIC PANEL - Abnormal; Notable for the following components:       Result Value   Glucose, Bld 232 (*)    BUN 50 (*)    Creatinine, Ser 4.01 (*)    Calcium 8.4 (*)    Albumin 2.8 (*)    GFR calc non Af Amer 13 (*)    GFR calc Af Amer 16 (*)    All other components within normal limits  BRAIN NATRIURETIC PEPTIDE - Abnormal; Notable for the following components:   B Natriuretic Peptide 3,410.0 (*)    All other components within normal limits  TROPONIN I - Abnormal; Notable for the following components:   Troponin I 0.10 (*)    All other components within normal limits  CBC WITH DIFFERENTIAL/PLATELET - Abnormal; Notable for the following components:   RBC 3.37 (*)    Hemoglobin 9.6 (*)    HCT 31.3 (*)    Platelets 452 (*)    Eosinophils Absolute 0.7 (*)    All other components within normal limits  LACTIC ACID, PLASMA  PROTIME-INR   ____________________________________________  EKG  ED ECG REPORT I, Hinda Kehr, the attending physician, personally viewed and interpreted this ECG.  Date: 02/24/2018 EKG Time: 2:07 AM Rate: 92 Rhythm: normal sinus rhythm with occasional PVC QRS Axis: normal Intervals: Right bundle branch block and left posterior fascicular block ST/T Wave abnormalities: Non-specific ST segment / T-wave changes, but no evidence of acute ischemia. Narrative Interpretation: no evidence of acute ischemia   ____________________________________________  RADIOLOGY I, Hinda Kehr, personally viewed and evaluated these images (plain radiographs) as part of my medical decision making, as well as reviewing the written report by the radiologist.  ED MD interpretation: Pulmonary vascular congestion  Official radiology report(s): Dg Chest 2 View  Result Date: 02/24/2018 CLINICAL DATA:  Acute onset of shortness of breath. EXAM: CHEST - 2 VIEW COMPARISON:  Chest radiograph  performed 02/21/2018 FINDINGS: The lungs are well-aerated. Vascular congestion is noted. Mild peribronchial thickening is noted. There is no evidence of pleural  effusion or pneumothorax. The heart is borderline enlarged. No acute osseous abnormalities are seen. IMPRESSION: Vascular congestion and borderline cardiomegaly. Mild peribronchial thickening noted. Electronically Signed   By: Garald Balding M.D.   On: 02/24/2018 02:38    ____________________________________________   PROCEDURES  Critical Care performed: No   Procedure(s) performed:   Procedures   ____________________________________________   INITIAL IMPRESSION / ASSESSMENT AND PLAN / ED COURSE  As part of my medical decision making, I reviewed the following data within the [facility] notes reviewed and incorporated, Labs reviewed , EKG interpreted , Radiograph reviewed , Discussed with admitting physician  and Notes from prior ED visits    Differential diagnosis includes, but is not limited to, CHF exacerbation, worsening kidney failure, pneumonia, PE, ACS.  The patient's signs and symptoms are most consistent with acute CHF exacerbation in the setting of known end-stage kidney disease.  His creatinine is essentially at baseline but his symptoms have gotten steadily worse over the last few days.  His BNP has gone up from about 2000 to about 3400 and he has pulmonary vascular congestion on chest x-ray.  His troponin is 0.1 and it was 0.06 three days ago.  I suspect this is reflective of his renal function and respiratory effort rather than a primary cardiac issue.  Given that he has failed outpatient treatment and that he may need to start on dialysis (although not emergently), I am giving him Lasix 80 mg IV and I discussed the case with the hospitalist who will admit and consult nephrology and cardiology in the morning.  If he decompensates I will start him on BiPAP but I do not believe he needs BiPAP at this time.  The patient and family agree with the plan.  He was not hypoxemic but I have put him on 2 L of oxygen by nasal cannula for comfort.      ____________________________________________  FINAL CLINICAL IMPRESSION(S) / ED DIAGNOSES  Final diagnoses:  Acute on chronic congestive heart failure, unspecified heart failure type (HCC)  Shortness of breath  Chronic kidney disease, unspecified CKD stage  Elevated troponin I level     MEDICATIONS GIVEN DURING THIS VISIT:  Medications  furosemide (LASIX) injection 80 mg (80 mg Intravenous Given 02/24/18 0356)     ED Discharge Orders    None       Note:  This document was prepared using Dragon voice recognition software and may include unintentional dictation errors.    Hinda Kehr, MD 02/24/18 [id number]

## 2018-02-24 NOTE — ED Notes (Signed)
Pt transported to Rm [id number]

## 2018-02-24 NOTE — ED Notes (Signed)
Pt leaving for x-ray.  ?

## 2018-02-24 NOTE — H&P (Signed)
[place] [facility] [place] NAME: Thomas [name]    MR#:  [id number]  DATE OF BIRTH:  [birth year]  DATE OF ADMISSION:  02/24/2018  PRIMARY CARE PHYSICIAN: Birdie Sons, MD   REQUESTING/REFERRING PHYSICIAN:   CHIEF COMPLAINT:   Chief Complaint  Patient presents with  . Shortness of Breath    HISTORY OF PRESENT ILLNESS: Thomas [name]  is a 72 y.o. male with a known history of CKD stage IV, CAD, diabetes type 2 and other comorbidities. Patient presented to emergency room for worsening shortness of breath going on for the past 3 to 4 days, gradually getting worse.  He has been compliant with his medications, including Lasix p.o. 40 mg twice a day, but his urine output has been poor in the past 3 days.  Patient follows with nephrology as outpatient and he had fistula started on his left upper extremity with plans for starting hemodialysis soon. No reports of fever, chills, cough, chest pain, palpitations. Blood test done emergency room are notable for creatinine of 4.01, troponin 0.1, BNP over 3000. Chest x-ray shows pulmonary vascular congestion. Patient is admitted for further evaluation and treatment.  PAST MEDICAL HISTORY:   Past Medical History:  Diagnosis Date  . Allergy   . Arthritis   . CAD (coronary artery disease)   . Chronic kidney disease   . Diabetes mellitus without complication ([place])   . Dyspnea   . Erosive esophagitis   . GERD (gastroesophageal reflux disease)   . Gouty arthropathy   . History of colonic polyps   . History of kidney stones   . History of MRSA infection   . Hyperkalemia   . Hyperlipidemia   . Hypertension   . Melanoma ([place])   . Myocardial infarction ([place]) 1986  . Peripheral vascular disease ([place])   . 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 Trigger finger of left hand   . Ureteral tumor     PAST SURGICAL HISTORY:  Past Surgical History:  Procedure Laterality Date  . AV  FISTULA PLACEMENT Left 12/07/2017   Procedure: ARTERIOVENOUS (AV) FISTULA CREATION ( BRACHIOBASILIC STAGE );  Surgeon: Algernon Huxley, MD;  Location: [facility]S;  Service: Vascular;  Laterality: Left;  . CATARACT EXTRACTION    . CORONARY ARTERY BYPASS GRAFT  1996  . CORONARY STENT INTERVENTION N/A 05/29/2017   Procedure: CORONARY STENT INTERVENTION;  Surgeon: Isaias Cowman, MD;  Location: [facility];  Service: Cardiovascular;  Laterality: N/A;  . EXCISION Ureteral Tumor     (benign) Dr. Quillian Quince  . EYE SURGERY Bilateral    Cataract Extraction with IOL  . LEFT HEART CATH AND CORONARY ANGIOGRAPHY N/A 10/18/2016   Procedure: Left Heart Cath and Coronary Angiography;  Surgeon: Isaias Cowman, MD;  Location: [facility];  Service: Cardiovascular;  Laterality: N/A;  . LEFT HEART CATH AND CORONARY ANGIOGRAPHY N/A 05/29/2017   Procedure: LEFT HEART CATH AND CORONARY ANGIOGRAPHY;  Surgeon: Isaias Cowman, MD;  Location: [place] [facility];  Service: Cardiovascular;  Laterality: N/A;  . LOWER EXTREMITY ANGIOGRAPHY Right 10/31/2016   Procedure: Lower Extremity Angiography;  Surgeon: Algernon Huxley, MD;  Location: [facility];  Service: Cardiovascular;  Laterality: Right;  . LOWER EXTREMITY ANGIOGRAPHY Left 11/14/2016   Procedure: Lower Extremity Angiography;  Surgeon: Algernon Huxley, MD;  Location: [facility];  Service: Cardiovascular;  Laterality: Left;  . LOWER EXTREMITY INTERVENTION  11/14/2016   Procedure: Lower Extremity Intervention;  Surgeon: Algernon Huxley, MD;  Location: [facility];  Service: Cardiovascular;;  . RETINAL DETACHMENT SURGERY Right November 2107    SOCIAL HISTORY:  Social History   Tobacco Use  . Smoking status: Former Smoker    Packs/day: 1.00    Years: 51.00    Pack years: 51.00    Types: Cigarettes    Last attempt to quit: 06/21/2017    Years since quitting: 0.6  . Smokeless tobacco: Never Used  . Tobacco comment: started  smoking at age [age]. Smoked for 50 years  Substance Use Topics  . Alcohol use: Yes    Alcohol/week: 0.0 standard drinks    Frequency: Never    Comment: 1 every 6 months    FAMILY HISTORY:  Family History  Problem Relation Age of Onset  . Alzheimer's disease Mother   . Alzheimer's disease Brother   . Lung cancer Paternal Grandfather     DRUG ALLERGIES:  Allergies  Allergen Reactions  . Ciprofloxacin Itching and Swelling    Facial swelling   . Hydralazine Nausea And Vomiting    REVIEW OF SYSTEMS:   CONSTITUTIONAL: No fever, fatigue or weakness.  EYES: No changes in vision.  EARS, NOSE, AND THROAT: No tinnitus or ear pain.  RESPIRATORY: Positive for shortness of breath no cough, wheezing or hemoptysis.  CARDIOVASCULAR: Positive for lower extremities edema.  No chest pain.  GASTROINTESTINAL: No nausea, vomiting, diarrhea or abdominal pain.  GENITOURINARY: No dysuria, hematuria.  ENDOCRINE: No polyuria, nocturia. HEMATOLOGY: No bleeding. SKIN: No rash or lesion. MUSCULOSKELETAL: No joint pain at this time.   NEUROLOGIC: No focal weakness.  PSYCHIATRY: No anxiety or depression.   MEDICATIONS AT HOME:  Prior to Admission medications   Medication Sig Start Date End Date Taking? Authorizing Provider  acetaminophen (TYLENOL) 500 MG tablet Take 500 mg by mouth every 8 (eight) hours as needed for mild pain or headache.   Yes [provider]  amLODipine (NORVASC) 2.5 MG tablet Take 1 tablet by mouth daily. 12/04/17  Yes [provider]  amoxicillin-clavulanate (AUGMENTIN) 875-125 MG tablet Take 1 tablet by mouth 2 (two) times daily for 10 days. 02/20/18 03/02/18 Yes Birdie Sons, MD  aspirin EC 81 MG tablet Take 81 mg by mouth daily.   Yes [provider]  atorvastatin (LIPITOR) 80 MG tablet TAKE 1 TABLET BY MOUTH ONCE DAILY Patient taking differently: daily at 6 PM.  08/19/17  Yes Fisher, Kirstie Peri, MD  clopidogrel (PLAVIX) 75 MG tablet TAKE 1 TABLET BY  MOUTH ONCE DAILY 01/09/18  Yes Dew, Erskine Squibb, MD  furosemide (LASIX) 40 MG tablet Take 1 tablet (40 mg total) by mouth 2 (two) times daily. 07/18/17  Yes Wieting, Richard, MD  glipiZIDE (GLUCOTROL XL) 10 MG 24 hr tablet TAKE 1 TABLET BY MOUTH ONCE DAILY WITH BREAKFAST 01/09/18  Yes Birdie Sons, MD  isosorbide mononitrate (IMDUR) 60 MG 24 hr tablet TAKE 1 TABLET BY MOUTH ONCE DAILY. 01/08/18  Yes Birdie Sons, MD  metoprolol succinate (TOPROL-XL) 50 MG 24 hr tablet TAKE 1 TABLET BY MOUTH ONCE DAILY WITH OR IMMEDIATELY FOLLOWING A MEAL. 01/08/18  Yes Birdie Sons, MD  pantoprazole (PROTONIX) 40 MG tablet Take 1 tablet (40 mg total) by mouth daily. Patient taking differently: Take 40 mg by mouth 2 (two) times daily.  06/03/16  Yes Birdie Sons, MD  sodium bicarbonate 650 MG tablet Take 650 mg by mouth 2 (  two) times daily.   Yes [provider]  tamsulosin (FLOMAX) 0.4 MG CAPS capsule TAKE 1 CAPSULE BY MOUTH ONCE DAILY AFTERSUPPER 01/16/18  Yes Birdie Sons, MD  traMADol (ULTRAM) 50 MG tablet Take 1 tablet (50 mg total) by mouth every 6 (six) hours as needed. 12/07/17  Yes Algernon Huxley, MD  b complex vitamins capsule Take 1 capsule by mouth daily.    [provider]  Blood Glucose Monitoring Suppl (ONE TOUCH ULTRA 2) w/Device KIT Use to check gluocose daily for type 2 diabetes 12/23/17   Birdie Sons, MD  buPROPion [facility] SR) 150 MG 12 hr tablet Take 1 tablet (150 mg total) by mouth 2 (two) times daily. Patient not taking: Reported on 02/24/2018 07/18/17   Loletha Grayer, MD  colchicine 0.6 MG tablet Take 1 tablet (0.6 mg total) by mouth daily as needed (gout). Patient not taking: Reported on 02/24/2018 09/12/17   Birdie Sons, MD  diclofenac sodium (VOLTAREN) 1 % GEL Apply 2 g topically 4 (four) times daily as needed. Patient not taking: Reported on 02/24/2018 09/12/17   Birdie Sons, MD  doxycycline (VIBRA-TABS) 100 MG tablet Take 1 tablet (100 mg total) by  mouth 2 (two) times daily. Patient not taking: Reported on 02/24/2018 02/19/18   Birdie Sons, MD  Dulaglutide (TRULICITY) 0.35 KK/9.3GH SOPN Inject 0.5 mLs as directed once a week. Patient not taking: Reported on 02/24/2018 01/24/18   Birdie Sons, MD  Ferrous Sulfate (IRON) 325 (65 Fe) MG TABS Take 325 mg by mouth 2 (two) times daily.    [provider]  glucose blood test strip Contour [employer] test strips and lancets Use to check blood sugar daily for type 2 diabetes, E11.9. 12/22/17   Birdie Sons, MD  vitamin B-12 (CYANOCOBALAMIN) 1000 MCG tablet Take 5,000 mcg by mouth daily.    [provider]      PHYSICAL EXAMINATION:   VITAL SIGNS: Blood pressure (!) 144/85, pulse 84, temperature (!) 97.5 F (36.4 C), temperature source Oral, resp. rate (!) 22, height 5' 6"  (1.676 m), weight 75.3 kg, SpO2 97 %.  GENERAL:  72 y.o.-year-old patient lying in the bed with moderate respiratory distress.  He is not able to lay down in the bed, due to shortness of breath.  He is sitting up, leaning forward. EYES: Pupils equal, round, reactive to light and accommodation. No scleral icterus. Extraocular muscles intact.  HEENT: Head atraumatic, normocephalic. Oropharynx and nasopharynx clear.  NECK:  Supple, no jugular venous distention. No thyroid enlargement, no tenderness.  LUNGS: Reduced breath sounds bilaterally, no wheezing, rales,rhonchi or crepitation. No use of accessory muscles of respiration.  CARDIOVASCULAR: S1, S2 normal. No S3/S4.  ABDOMEN: Soft, nontender, nondistended. Bowel sounds present. No organomegaly or mass.  EXTREMITIES: No pedal edema, cyanosis, or clubbing.  NEUROLOGIC: No focal weakness. PSYCHIATRIC: The patient is alert and oriented x 3.  SKIN: No obvious rash, lesion, or ulcer.   LABORATORY PANEL:   CBC Recent Labs  Lab 02/19/18 1040 02/21/18 1350 02/24/18 0159  WBC 10.5 10.5 10.3  HGB 9.6* 9.5* 9.6*  HCT 28.2* 30.1* 31.3*  PLT 400 421* 452*   MCV 85 90.9 92.9  MCH 28.8 28.7 28.5  MCHC 34.0 31.6 30.7  RDW 13.6 14.5 14.5  LYMPHSABS  --   --  1.7  MONOABS  --   --  1.0  EOSABS  --   --  0.7*  BASOSABS  --   --  0.1   ------------------------------------------------------------------------------------------------------------------  Chemistries  Recent Labs  Lab 02/19/18 1040 02/21/18 1350 02/24/18 0159  NA 136 137 137  K 4.8 4.5 5.0  CL 99 105 104  CO2 17* 21* 23  GLUCOSE 141* 185* 232*  BUN 45* 50* 50*  CREATININE 4.13* 4.11* 4.01*  CALCIUM 8.8 8.5* 8.4*  MG 1.6  --   --   AST 14  --  15  ALT 12  --  13  ALKPHOS 137*  --  117  BILITOT 0.3  --  0.5   ------------------------------------------------------------------------------------------------------------------ estimated creatinine clearance is 14.6 mL/min (A) (by C-G formula based on SCr of 4.01 mg/dL (H)). ------------------------------------------------------------------------------------------------------------------ No results for input(s): TSH, T4TOTAL, T3FREE, THYROIDAB in the last 72 hours.  Invalid input(s): FREET3   Coagulation profile Recent Labs  Lab 02/24/18 0159  INR 1.09   ------------------------------------------------------------------------------------------------------------------- No results for input(s): DDIMER in the last 72 hours. -------------------------------------------------------------------------------------------------------------------  Cardiac Enzymes Recent Labs  Lab 02/21/18 1350 02/24/18 0159  TROPONINI 0.06* 0.10*   ------------------------------------------------------------------------------------------------------------------ Invalid input(s): POCBNP  ---------------------------------------------------------------------------------------------------------------  Urinalysis    Component Value Date/Time   COLORURINE YELLOW (A) 07/10/2017 1818   APPEARANCEUR CLEAR (A) 07/10/2017 1818   APPEARANCEUR  Clear 04/01/2014 1715   LABSPEC 1.011 07/10/2017 1818   LABSPEC 1.008 04/01/2014 1715   PHURINE 6.0 07/10/2017 1818   GLUCOSEU 150 (A) 07/10/2017 1818   GLUCOSEU 50 mg/dL 04/01/2014 1715   HGBUR NEGATIVE 07/10/2017 1818   BILIRUBINUR NEGATIVE 07/10/2017 1818   BILIRUBINUR negative 07/03/2017 1157   BILIRUBINUR Negative 04/01/2014 [place] 07/10/2017 1818   PROTEINUR 100 (A) 07/10/2017 1818   UROBILINOGEN 0.2 07/03/2017 1157   NITRITE NEGATIVE 07/10/2017 1818   LEUKOCYTESUR NEGATIVE 07/10/2017 1818   LEUKOCYTESUR Negative 04/01/2014 1715     RADIOLOGY: Dg Chest 2 View  Result Date: 02/24/2018 CLINICAL DATA:  Acute onset of shortness of breath. EXAM: CHEST - 2 VIEW COMPARISON:  Chest radiograph performed 02/21/2018 FINDINGS: The lungs are well-aerated. Vascular congestion is noted. Mild peribronchial thickening is noted. There is no evidence of pleural effusion or pneumothorax. The heart is borderline enlarged. No acute osseous abnormalities are seen. IMPRESSION: Vascular congestion and borderline cardiomegaly. Mild peribronchial thickening noted. Electronically Signed   By: Garald Balding M.D.   On: 02/24/2018 02:38    EKG: Orders placed or performed during the hospital encounter of 02/24/18  . ED EKG  . ED EKG    IMPRESSION AND PLAN:  1.  Acute respiratory failure with hypoxia, secondary to acute pulmonary edema.  We will start treatment with IV Lasix and oxygen.  Patient might need hemodialysis, if not able to make urine and kidney function worsening.  Nephrology is consulted for further evaluation and treatment. 2.  Acute pulmonary edema.  See treatment as above under #1. 3.  CKD 4.  Nephrology is consulted for further evaluation and treatment. 4.  Elevated troponin level, likely secondary to demand ischemia from acute pulmonary edema and kidney failure.  We will continue to monitor patient on telemetry and follow troponin levels to rule out ACS. 5.  Diabetes  type 2.  Will monitor blood sugars before meals and at bedtime and treat with insulin during the hospital stay.  All the records are reviewed and case discussed with ED provider. Management plans discussed with the patient, family and they are in agreement.  CODE STATUS: Full Code Status History    Date Active Date Inactive Code Status Order ID Comments User Context  07/17/2017 0059 07/18/2017 1826 Full Code [id number]  Amelia Jo, MD Inpatient   06/10/2017 1610 06/14/2017 1308 Full Code [id number]  Henreitta Leber, MD Inpatient   05/25/2017 0024 05/30/2017 1325 Full Code [id number]  Lance Coon, MD Inpatient   11/14/2016 [phone] 11/14/2016 1442 Full Code [id number]  Algernon Huxley, MD Inpatient   10/31/2016 1121 10/31/2016 1817 Full Code [id number]  Algernon Huxley, MD Inpatient   10/18/2016 1357 10/18/2016 1850 Full Code [id number]  Isaias Cowman, MD Inpatient       TOTAL TIME TAKING CARE OF THIS PATIENT: 50 minutes.    Amelia Jo M.D on 02/24/2018 at 4:19 AM  Between 7am to 6pm - Pager - [phone]  After 6pm go to [email or web address] - password [email or web address] [facility]  [phone]  CC: Primary care physician; Birdie Sons, MD

## 2018-02-24 NOTE — Plan of Care (Signed)
  Problem: Education: Goal: Knowledge of General Education information will improve Description: Including pain rating scale, medication(s)/side effects and non-pharmacologic comfort measures Outcome: Progressing   Problem: Activity: Goal: Risk for activity intolerance will decrease Outcome: Progressing   Problem: Nutrition: Goal: Adequate nutrition will be maintained Outcome: Progressing   

## 2018-02-24 NOTE — ED Notes (Signed)
Report from georgie,rn.  

## 2018-02-24 NOTE — ED Notes (Signed)
Critical troponin of 0.10 called from lab.dr. forbach notified, no new orders received.

## 2018-02-24 NOTE — ED Triage Notes (Signed)
Pt presents to the ER from home with complaints of shortness of breath reports seen for the same 3 days ago reports shortness of breath has increased, pt has HX CHF, fistula to left arm. PT talks in complete sentences no respiratory distress noted

## 2018-02-24 NOTE — Progress Notes (Signed)
[facility], [place] 02/24/18  Subjective:   Patient known to our practice from outpatient and is followed for chronic kidney disease.  He states that for the past 2 weeks he has been sick and has had some worsening shortness of breath and lower extremity edema.  He was evaluated in the emergency room 3 days ago.  Today he presents for worsening shortness of breath and leg edema.  He is requiring oxygen supplementation by nasal cannula.  He is being treated with IV furosemide.  Objective:  Vital signs in last 24 hours:  Temp:  [97.5 F (36.4 C)-97.9 F (36.6 C)] 97.9 F (36.6 C) (11/09 0756) Pulse Rate:  [82-93] 90 (11/09 0756) Resp:  [11-24] 20 (11/09 0501) BP: (129-148)/(67-87) 139/75 (11/09 0756) SpO2:  [95 %-100 %] 100 % (11/09 0756) Weight:  [75.3 kg-80.1 kg] 80.1 kg (11/09 0501)  Weight change:  Filed Weights   02/24/18 0200 02/24/18 0501  Weight: 75.3 kg 80.1 kg    Intake/Output:    Intake/Output Summary (Last 24 hours) at 02/24/2018 1023 Last data filed at 02/24/2018 1021 Gross per 24 hour  Intake 240 ml  Output 500 ml  Net -260 ml     Physical Exam: General:  No acute distress, laying in the bed  HEENT  anicteric, moist oral mucous membranes  Neck  supple  Pulm/lungs  mild diffuse bibasilar crackles  CVS/Heart  regular rhythm  Abdomen:   Soft, nontender  Extremities:  2+ pitting edema bilaterally  Neurologic:  Alert, oriented  Skin:  No acute rashes  Access:  Left upper extremity AV fistula       Basic Metabolic Panel:  Recent Labs  Lab 02/19/18 1040 02/21/18 1350 02/24/18 0159 02/24/18 0603  NA 136 137 137 138  K 4.8 4.5 5.0 5.0  CL 99 105 104 108  CO2 17* 21* 23 21*  GLUCOSE 141* 185* 232* 178*  BUN 45* 50* 50* 52*  CREATININE 4.13* 4.11* 4.01* 4.01*  CALCIUM 8.8 8.5* 8.4* 8.4*  MG 1.6  --   --   --      CBC: Recent Labs  Lab 02/19/18 1040 02/21/18 1350 02/24/18 0159 02/24/18 0603  WBC 10.5 10.5 10.3  11.5*  NEUTROABS  --   --  6.8  --   HGB 9.6* 9.5* 9.6* 9.5*  HCT 28.2* 30.1* 31.3* 30.2*  MCV 85 90.9 92.9 89.9  PLT 400 421* 452* 418*     No results found for: HEPBSAG, HEPBSAB, HEPBIGM    Microbiology:  No results found for this or any previous visit (from the past 240 hour(s)).  Coagulation Studies: Recent Labs    02/24/18 0159  LABPROT 14.0  INR 1.09    Urinalysis: No results for input(s): COLORURINE, LABSPEC, PHURINE, GLUCOSEU, HGBUR, BILIRUBINUR, KETONESUR, PROTEINUR, UROBILINOGEN, NITRITE, LEUKOCYTESUR in the last 72 hours.  Invalid input(s): APPERANCEUR    Imaging: Dg Chest 2 View  Result Date: 02/24/2018 CLINICAL DATA:  Acute onset of shortness of breath. EXAM: CHEST - 2 VIEW COMPARISON:  Chest radiograph performed 02/21/2018 FINDINGS: The lungs are well-aerated. Vascular congestion is noted. Mild peribronchial thickening is noted. There is no evidence of pleural effusion or pneumothorax. The heart is borderline enlarged. No acute osseous abnormalities are seen. IMPRESSION: Vascular congestion and borderline cardiomegaly. Mild peribronchial thickening noted. Electronically Signed   By: Garald Balding M.D.   On: 02/24/2018 02:38     Medications:    . amLODipine  2.5 mg Oral Daily  .  aspirin EC  81 mg Oral Daily  . atorvastatin  80 mg Oral Daily  . B-complex with vitamin C  1 tablet Oral Daily  . clopidogrel  75 mg Oral Daily  . docusate sodium  100 mg Oral BID  . ferrous sulfate  325 mg Oral BID  . furosemide  60 mg Intravenous BID  . heparin  5,000 Units Subcutaneous Q8H  . insulin aspart  0-5 Units Subcutaneous QHS  . insulin aspart  0-9 Units Subcutaneous TID WC  . isosorbide mononitrate  60 mg Oral Daily  . metoprolol succinate  50 mg Oral Daily  . pantoprazole  40 mg Oral BID  . sodium bicarbonate  650 mg Oral BID  . tamsulosin  0.4 mg Oral Daily   acetaminophen **OR** acetaminophen, bisacodyl, HYDROcodone-acetaminophen, ondansetron **OR**  ondansetron (ZOFRAN) IV, traMADol, traZODone  Assessment/ Plan:  72 y.o. Caucasian male with advanced chronic kidney disease stage V, diabetes with nephropathy, severe coronary disease, CABG 1996, peripheral arterial disease, non-STEMI February 3007, chronic systolic congestive heart failure  1.  Chronic kidney disease stage V.  Patient has mild uremic symptoms in terms of poor appetite and he has developed some volume overload.  Currently he is being treated with IV furosemide with good response.  Discussed with patient and family regarding timing of dialysis.  Patient wants to see the response to IV Lasix . we will monitor closely.  There is no acute indication for dialysis at present  2.  Lower extremity edema Diuresis with IV Lasix  3.  Diabetes type 2 with chronic kidney disease, insulin-dependent Hemoglobin A1c 9.3% from January 24, 2018  4.  Chronic systolic congestive heart failure Lower extremity edema IV Lasix No ACE inhibitor or ARB due to advanced CKD     LOS: 0 Thomas [name] 11/9/201910:23 AM  [place], [place]  Note: This note was prepared with Dragon dictation. Any transcription errors are unintentional

## 2018-02-24 NOTE — ED Notes (Signed)
Pt placed on oxygen 2lpm via [place] for comfort.

## 2018-02-24 NOTE — ED Notes (Signed)
md at bedside

## 2018-02-25 LAB — GLUCOSE, CAPILLARY: Glucose-Capillary: 117 mg/dL — ABNORMAL HIGH (ref 70–99)

## 2018-02-25 LAB — BRAIN NATRIURETIC PEPTIDE: B Natriuretic Peptide: 2048 pg/mL — ABNORMAL HIGH (ref 0.0–100.0)

## 2018-02-25 LAB — MAGNESIUM: Magnesium: 2.1 mg/dL (ref 1.7–2.4)

## 2018-02-25 MED ORDER — TORSEMIDE 20 MG PO TABS: 40.0000 mg | ORAL_TABLET | Freq: Two times a day (BID) | ORAL | 0 refills | Status: DC

## 2018-02-25 MED ORDER — TORSEMIDE 20 MG PO TABS: 40.0000 mg | ORAL_TABLET | Freq: Two times a day (BID) | ORAL | Status: DC

## 2018-02-25 NOTE — Progress Notes (Signed)
[facility], [place] 02/25/18  Subjective:   Patient feels a lot better this morning.  Appetite is better.  States he ate a good breakfast.  Able to sleep at night and was able to lay flat without getting short of breath.  Lower extremity edema has improved but not resolved  Objective:  Vital signs in last 24 hours:  Temp:  [97.4 F (36.3 C)-98 F (36.7 C)] 97.4 F (36.3 C) (11/10 0815) Pulse Rate:  [82-93] 93 (11/10 0815) Resp:  [16-19] 16 (11/10 0815) BP: (132-143)/(73-79) 143/79 (11/10 0815) SpO2:  [95 %-100 %] 95 % (11/10 0815) Weight:  [75.5 kg-76.4 kg] 75.5 kg (11/10 0611)  Weight change: 1.089 kg Filed Weights   02/24/18 0501 02/24/18 1045 02/25/18 0611  Weight: 80.1 kg 76.4 kg 75.5 kg    Intake/Output:    Intake/Output Summary (Last 24 hours) at 02/25/2018 1037 Last data filed at 02/25/2018 0809 Gross per 24 hour  Intake 360 ml  Output 975 ml  Net -615 ml     Physical Exam: General:  No acute distress, laying in the bed  HEENT  anicteric, moist oral mucous membranes  Neck  supple  Pulm/lungs  decreased breath sounds, no crackles  CVS/Heart  regular rhythm with ectopic beats  Abdomen:   Soft, nontender  Extremities:  + pitting edema bilaterally over the ankles  Neurologic:  Alert, oriented  Skin:  No acute rashes  Access:  Left upper extremity AV fistula       Basic Metabolic Panel:  Recent Labs  Lab 02/19/18 1040 02/21/18 1350 02/24/18 0159 02/24/18 0603 02/25/18 0924  NA 136 137 137 138  --   K 4.8 4.5 5.0 5.0  --   CL 99 105 104 108  --   CO2 17* 21* 23 21*  --   GLUCOSE 141* 185* 232* 178*  --   BUN 45* 50* 50* 52*  --   CREATININE 4.13* 4.11* 4.01* 4.01*  --   CALCIUM 8.8 8.5* 8.4* 8.4*  --   MG 1.6  --   --   --  2.1     CBC: Recent Labs  Lab 02/19/18 1040 02/21/18 1350 02/24/18 0159 02/24/18 0603  WBC 10.5 10.5 10.3 11.5*  NEUTROABS  --   --  6.8  --   HGB 9.6* 9.5* 9.6* 9.5*  HCT 28.2* 30.1*  31.3* 30.2*  MCV 85 90.9 92.9 89.9  PLT 400 421* 452* 418*     No results found for: HEPBSAG, HEPBSAB, HEPBIGM    Microbiology:  No results found for this or any previous visit (from the past 240 hour(s)).  Coagulation Studies: Recent Labs    02/24/18 0159  LABPROT 14.0  INR 1.09    Urinalysis: No results for input(s): COLORURINE, LABSPEC, PHURINE, GLUCOSEU, HGBUR, BILIRUBINUR, KETONESUR, PROTEINUR, UROBILINOGEN, NITRITE, LEUKOCYTESUR in the last 72 hours.  Invalid input(s): APPERANCEUR    Imaging: Dg Chest 2 View  Result Date: 02/24/2018 CLINICAL DATA:  Acute onset of shortness of breath. EXAM: CHEST - 2 VIEW COMPARISON:  Chest radiograph performed 02/21/2018 FINDINGS: The lungs are well-aerated. Vascular congestion is noted. Mild peribronchial thickening is noted. There is no evidence of pleural effusion or pneumothorax. The heart is borderline enlarged. No acute osseous abnormalities are seen. IMPRESSION: Vascular congestion and borderline cardiomegaly. Mild peribronchial thickening noted. Electronically Signed   By: Garald Balding M.D.   On: 02/24/2018 02:38     Medications:    . amLODipine  2.5 mg  Oral Daily  . aspirin EC  81 mg Oral Daily  . atorvastatin  80 mg Oral Daily  . B-complex with vitamin C  1 tablet Oral Daily  . clopidogrel  75 mg Oral Daily  . docusate sodium  100 mg Oral BID  . ferrous sulfate  325 mg Oral BID  . furosemide  60 mg Intravenous BID  . heparin  5,000 Units Subcutaneous Q8H  . insulin aspart  0-5 Units Subcutaneous QHS  . insulin aspart  0-9 Units Subcutaneous TID WC  . isosorbide mononitrate  60 mg Oral Daily  . metoprolol succinate  50 mg Oral Daily  . pantoprazole  40 mg Oral BID  . sodium bicarbonate  650 mg Oral BID  . tamsulosin  0.4 mg Oral Daily  . torsemide  40 mg Oral BID   acetaminophen **OR** acetaminophen, bisacodyl, HYDROcodone-acetaminophen, hydrocortisone cream, promethazine, traMADol, traZODone  Assessment/  Plan:  72 y.o. Caucasian male with advanced chronic kidney disease stage V, diabetes with nephropathy, severe coronary disease, CABG 1996, peripheral arterial disease, non-STEMI February 0086, chronic systolic congestive heart failure  1.  Chronic kidney disease stage V.  Patient has mild uremic symptoms -Clinically improved today  -Expected to be discharged.  We will follow-up on Wednesday in the office  2.  Lower extremity edema with acute pulmonary edema Diuresis with IV Lasix. Change to oral torsemide 40 mg twice a day Monitor daily weight and blood pressure at home-instructed patient to do that Today's weight is back to outpatient weight    3.  Diabetes type 2 with chronic kidney disease, insulin-dependent Hemoglobin A1c 9.3% from January 24, 2018  4.  Chronic systolic congestive heart failure Lower extremity edema Improved with IV Lasix No ACE inhibitor or ARB due to advanced CKD     LOS: [place] 11/10/201910:37 AM  [place], [place]  Note: This note was prepared with Dragon dictation. Any transcription errors are unintentional

## 2018-02-25 NOTE — Progress Notes (Signed)
Patient discharging home, IV removed. Instructions and prescriptions given to patient and wife, verbalized understanding. Wife transporting patient home.

## 2018-02-25 NOTE — Discharge Summary (Signed)
Thomas [name] at [place] NAME: Thomas [name]    MR#:  [id number]  DATE OF BIRTH:  1946-02-03  DATE OF ADMISSION:  02/24/2018 ADMITTING PHYSICIAN: Amelia Jo, MD  DATE OF DISCHARGE: No discharge date for patient encounter.  PRIMARY CARE PHYSICIAN: Birdie Sons, MD    ADMISSION DIAGNOSIS:  Shortness of breath [R06.02] Elevated troponin I level [R79.89] Chronic kidney disease, unspecified CKD stage [N18.9] Acute on chronic congestive heart failure, unspecified heart failure type (Thomas [name]) [I50.9]  DISCHARGE DIAGNOSIS:  Active Problems:   Acute respiratory failure (Thomas [name])   SECONDARY DIAGNOSIS:   Past Medical History:  Diagnosis Date  . Allergy   . Arthritis   . CAD (coronary artery disease)   . Chronic kidney disease   . Diabetes mellitus without complication (Thomas [name])   . Dyspnea   . Erosive esophagitis   . GERD (gastroesophageal reflux disease)   . Gouty arthropathy   . History of colonic polyps   . History of kidney stones   . History of MRSA infection   . Hyperkalemia   . Hyperlipidemia   . Hypertension   . Melanoma (Thomas [name])   . Myocardial infarction (Thomas [name]) 1986  . Peripheral vascular disease (Thomas [name])   . 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 Trigger finger of left hand   . Ureteral tumor     HOSPITAL COURSE:  *Acute respiratory failure with hypoxia Resolved Due to acute on chronic diastolic congestive heart failure from fluid overload/worsening chronic kidney disease stage V Successfully weaned off oxygen, nephrology did see patient while in house-recommended torsemide 40 mg p.o. twice daily going forward with plans for discharge to home with outpatient continued follow-up  *Acute on chronic congestive heart failure exacerbation Secondary to diastolic dysfunction, acute fluid overload state from worsening chronic kidney disease stage V  *Chronic kidney disease stage V Nephrology consulted while in  house-see above  *Acute elevated troponins Secondary to severe renal insufficiency  * Chronic Diabetes type 2 Stable on current regiment  DISCHARGE CONDITIONS:   stable  CONSULTS OBTAINED:  Treatment Team:  Murlean Iba, MD  DRUG ALLERGIES:   Allergies  Allergen Reactions  . Ciprofloxacin Itching and Swelling    Facial swelling   . Hydralazine Nausea And Vomiting    DISCHARGE MEDICATIONS:   Allergies as of 02/25/2018      Reactions   Ciprofloxacin Itching, Swelling   Facial swelling    Hydralazine Nausea And Vomiting      Medication List    STOP taking these medications   furosemide 40 MG tablet Commonly known as:  LASIX     TAKE these medications   acetaminophen 500 MG tablet Commonly known as:  TYLENOL Take 500 mg by mouth every 8 (eight) hours as needed for mild pain or headache.   amLODipine 2.5 MG tablet Commonly known as:  NORVASC Take 1 tablet by mouth daily.   amoxicillin-clavulanate 875-125 MG tablet Commonly known as:  AUGMENTIN Take 1 tablet by mouth 2 (two) times daily for 10 days.   aspirin EC 81 MG tablet Take 81 mg by mouth daily.   atorvastatin 80 MG tablet Commonly known as:  LIPITOR TAKE 1 TABLET BY MOUTH ONCE DAILY What changed:    how much to take  how to take this  when to take this   b complex vitamins capsule Take 1 capsule by mouth daily.   buPROPion 150 MG 12 hr tablet Commonly  known as:  WELLBUTRIN SR Take 1 tablet (150 mg total) by mouth 2 (two) times daily.   clopidogrel 75 MG tablet Commonly known as:  PLAVIX TAKE 1 TABLET BY MOUTH ONCE DAILY   colchicine 0.6 MG tablet Take 1 tablet (0.6 mg total) by mouth daily as needed (gout).   diclofenac sodium 1 % Gel Commonly known as:  VOLTAREN Apply 2 g topically 4 (four) times daily as needed.   doxycycline 100 MG tablet Commonly known as:  VIBRA-TABS Take 1 tablet (100 mg total) by mouth 2 (two) times daily.   Dulaglutide 0.75 MG/0.5ML Sopn Inject 0.5  mLs as directed once a week.   glipiZIDE 10 MG 24 hr tablet Commonly known as:  GLUCOTROL XL TAKE 1 TABLET BY MOUTH ONCE DAILY WITH BREAKFAST   glucose blood test strip Contour Ascensia test strips and lancets Use to check blood sugar daily for type 2 diabetes, E11.9.   Iron 325 (65 Fe) MG Tabs Take 325 mg by mouth 2 (two) times daily.   isosorbide mononitrate 60 MG 24 hr tablet Commonly known as:  IMDUR TAKE 1 TABLET BY MOUTH ONCE DAILY.   metoprolol succinate 50 MG 24 hr tablet Commonly known as:  TOPROL-XL TAKE 1 TABLET BY MOUTH ONCE DAILY WITH OR IMMEDIATELY FOLLOWING A MEAL.   ONE TOUCH ULTRA 2 w/Device Kit Use to check gluocose daily for type 2 diabetes   pantoprazole 40 MG tablet Commonly known as:  PROTONIX Take 1 tablet (40 mg total) by mouth daily. What changed:  when to take this   sodium bicarbonate 650 MG tablet Take 650 mg by mouth 2 (two) times daily.   tamsulosin 0.4 MG Caps capsule Commonly known as:  FLOMAX TAKE 1 CAPSULE BY MOUTH ONCE DAILY AFTERSUPPER   torsemide 20 MG tablet Commonly known as:  DEMADEX Take 2 tablets (40 mg total) by mouth 2 (two) times daily.   traMADol 50 MG tablet Commonly known as:  ULTRAM Take 1 tablet (50 mg total) by mouth every 6 (six) hours as needed.   vitamin B-12 1000 MCG tablet Commonly known as:  CYANOCOBALAMIN Take 5,000 mcg by mouth daily.        DISCHARGE INSTRUCTIONS:  If you experience worsening of your admission symptoms, develop shortness of breath, life threatening emergency, suicidal or homicidal thoughts you must seek medical attention immediately by calling 911 or calling your MD immediately  if symptoms less severe.  You Must read complete instructions/literature along with all the possible adverse reactions/side effects for all the Medicines you take and that have been prescribed to you. Take any new Medicines after you have completely understood and accept all the possible adverse reactions/side  effects.   Please note  You were cared for by a hospitalist during your hospital stay. If you have any questions about your discharge medications or the care you received while you were in the hospital after you are discharged, you can call the unit and asked to speak with the hospitalist on call if the hospitalist that took care of you is not available. Once you are discharged, your primary care physician will handle any further medical issues. Please note that NO REFILLS for any discharge medications will be authorized once you are discharged, as it is imperative that you return to your primary care physician (or establish a relationship with a primary care physician if you do not have one) for your aftercare needs so that they can reassess your need for medications and monitor  your lab values.    Today   CHIEF COMPLAINT:   Chief Complaint  Patient presents with  . Shortness of Breath    HISTORY OF PRESENT ILLNESS:  72 y.o. male with a known history of CKD stage IV, CAD, diabetes type 2 and other comorbidities. Patient presented to emergency room for worsening shortness of breath going on for the past 3 to 4 days, gradually getting worse.  He has been compliant with his medications, including Lasix p.o. 40 mg twice a day, but his urine output has been poor in the past 3 days.  Patient follows with nephrology as outpatient and he had fistula started on his left upper extremity with plans for starting hemodialysis soon. No reports of fever, chills, cough, chest pain, palpitations. Blood test done emergency room are notable for creatinine of 4.01, troponin 0.1, BNP over 3000. Chest x-ray shows pulmonary vascular congestion. Patient is admitted for further evaluation and treatment. VITAL SIGNS:  Blood pressure (!) 143/79, pulse 93, temperature (!) 97.4 F (36.3 C), temperature source Oral, resp. rate 16, height 5' 6"  (1.676 m), weight 75.5 kg, SpO2 95 %.  I/O:    Intake/Output Summary  (Last 24 hours) at 02/25/2018 1023 Last data filed at 02/25/2018 0809 Gross per 24 hour  Intake 360 ml  Output 975 ml  Net -615 ml    PHYSICAL EXAMINATION:  GENERAL:  72 y.o.-year-old patient lying in the bed with no acute distress.  EYES: Pupils equal, round, reactive to light and accommodation. No scleral icterus. Extraocular muscles intact.  HEENT: Head atraumatic, normocephalic. Oropharynx and nasopharynx clear.  NECK:  Supple, no jugular venous distention. No thyroid enlargement, no tenderness.  LUNGS: Normal breath sounds bilaterally, no wheezing, rales,rhonchi or crepitation. No use of accessory muscles of respiration.  CARDIOVASCULAR: S1, S2 normal. No murmurs, rubs, or gallops.  ABDOMEN: Soft, non-tender, non-distended. Bowel sounds present. No organomegaly or mass.  EXTREMITIES: No pedal edema, cyanosis, or clubbing.  NEUROLOGIC: Cranial nerves II through XII are intact. Muscle strength 5/5 in all extremities. Sensation intact. Gait not checked.  PSYCHIATRIC: The patient is alert and oriented x 3.  SKIN: No obvious rash, lesion, or ulcer.   DATA REVIEW:   CBC Recent Labs  Lab 02/24/18 0603  WBC 11.5*  HGB 9.5*  HCT 30.2*  PLT 418*    Chemistries  Recent Labs  Lab 02/24/18 0159 02/24/18 0603 02/25/18 0924  NA 137 138  --   K 5.0 5.0  --   CL 104 108  --   CO2 23 21*  --   GLUCOSE 232* 178*  --   BUN 50* 52*  --   CREATININE 4.01* 4.01*  --   CALCIUM 8.4* 8.4*  --   MG  --   --  2.1  AST 15  --   --   ALT 13  --   --   ALKPHOS 117  --   --   BILITOT 0.5  --   --     Cardiac Enzymes Recent Labs  Lab 02/24/18 0159  TROPONINI 0.10*    Microbiology Results  Results for orders placed or performed in visit on 07/03/17  Urine Culture     Status: None   Collection Time: 07/03/17 12:14 PM  Result Value Ref Range Status   Urine Culture, Routine Final report  Final   Organism ID, Bacteria Comment  Final    Comment: Mixed urogenital flora Less than  10,000 colonies/mL     RADIOLOGY:  Dg Chest 2 View  Result Date: 02/24/2018 CLINICAL DATA:  Acute onset of shortness of breath. EXAM: CHEST - 2 VIEW COMPARISON:  Chest radiograph performed 02/21/2018 FINDINGS: The lungs are well-aerated. Vascular congestion is noted. Mild peribronchial thickening is noted. There is no evidence of pleural effusion or pneumothorax. The heart is borderline enlarged. No acute osseous abnormalities are seen. IMPRESSION: Vascular congestion and borderline cardiomegaly. Mild peribronchial thickening noted. Electronically Signed   By: Garald Balding M.D.   On: 02/24/2018 02:38    EKG:   Orders placed or performed during the hospital encounter of 02/24/18  . ED EKG  . ED EKG      Management plans discussed with the patient, family and they are in agreement.  CODE STATUS:     Code Status Orders  (From admission, onward)         Start     Ordered   02/24/18 0450  Full code  Continuous     02/24/18 0450        Code Status History    Date Active Date Inactive Code Status Order ID Comments User Context   07/17/2017 0059 07/18/2017 1826 Full Code [id number]  Amelia Jo, MD Inpatient   06/10/2017 1610 06/14/2017 1308 Full Code [id number]  Henreitta Leber, MD Inpatient   05/25/2017 0024 05/30/2017 1325 Full Code [id number]  Lance Coon, MD Inpatient   11/14/2016 0938 11/14/2016 1442 Full Code [id number]  Algernon Huxley, MD Inpatient   10/31/2016 1121 10/31/2016 1817 Full Code [id number]  Algernon Huxley, MD Inpatient   10/18/2016 1357 10/18/2016 1850 Full Code [id number]  Isaias Cowman, MD Inpatient      TOTAL TIME TAKING CARE OF THIS PATIENT: 40 minutes.    Avel Peace Italo Banton M.D on 02/25/2018 at 10:23 AM  Between 7am to 6pm - Pager - [phone]  After 6pm go to [email or web address] - password [email or web address] [facility]  Office  [phone]  CC: Primary care physician; Birdie Sons, MD   Note: This dictation was prepared with Dragon dictation  along with smaller phrase technology. Any transcriptional errors that result from this process are unintentional.

## 2018-02-25 NOTE — Progress Notes (Signed)
CCMD called in regards to patient had a QT greater than 500, and a run of vtach this am. Dr Jerelyn Charles paged.

## 2018-02-26 ENCOUNTER — Ambulatory Visit (INDEPENDENT_AMBULATORY_CARE_PROVIDER_SITE_OTHER): Payer: Medicare Other | Admitting: Family Medicine

## 2018-02-26 ENCOUNTER — Encounter: Payer: Self-pay | Admitting: Family Medicine

## 2018-02-26 VITALS — BP 126/64 | HR 82 | Temp 97.7°F | Resp 16 | Wt 173.0 lb

## 2018-02-26 DIAGNOSIS — N183 Chronic kidney disease, stage 3 (moderate): Secondary | ICD-10-CM

## 2018-02-26 DIAGNOSIS — E1122 Type 2 diabetes mellitus with diabetic chronic kidney disease: Secondary | ICD-10-CM

## 2018-02-26 DIAGNOSIS — I5023 Acute on chronic systolic (congestive) heart failure: Secondary | ICD-10-CM | POA: Diagnosis not present

## 2018-02-26 DIAGNOSIS — N184 Chronic kidney disease, stage 4 (severe): Secondary | ICD-10-CM | POA: Diagnosis not present

## 2018-02-26 MED ORDER — INSULIN DEGLUDEC 100 UNIT/ML ~~LOC~~ SOPN: 8.0000 [IU] | PEN_INJECTOR | Freq: Every day | SUBCUTANEOUS | 0 refills | Status: DC

## 2018-02-26 NOTE — Progress Notes (Signed)
Patient: Thomas [name] Male    DOB: 1945/09/16   72 y.o.   MRN: [id number] Visit Date: 02/26/2018  Today's Provider: Lelon Huh, MD   Chief Complaint  Patient presents with  . Hospitalization Follow-up    ER visit 02/24/2018 Acute respiratory failure, due to acute on chronic CHF from fluid overload.  . Congestive Heart Failure  . Bronchitis   Subjective:    Congestive Heart Failure  Presents for follow-up visit. Associated symptoms include fatigue and shortness of breath. Pertinent negatives include no chest pain, palpitations or unexpected weight change. The symptoms have been improving.  Breathing Problem  He complains of cough, difficulty breathing, shortness of breath and wheezing. The cough is non-productive. Associated symptoms include headaches and malaise/fatigue. Pertinent negatives include no appetite change, chest pain or fever. His past medical history is significant for bronchitis. Past medical history comments: Congestive Heart Failure.  He was hospitalized over the weekend and discharged yesterday for CHF exacerbation. He was seen by Dr. Candiss Norse. At discharge his diuretic was changed to torsemide 63m BID and is to follow up on 02/28/2018. CHF exacerbation was felt to be due to fluid overload for worsening CKD. He states his breathing has greatly improved. He was having a lot of abdominal bloating and belching which he attributed to Trulicity that was initiated in August, which has now been discontinues for the last week. He states GI sx are greatly improved. His home sugars have been in the low to mid 200s.    Wt Readings from Last 3 Encounters:  02/26/18 173 lb (78.5 kg)  02/25/18 166 lb 8 oz (75.5 kg)  02/21/18 168 lb (76.2 kg)   Wt Readings from Last 5 Encounters:  02/26/18 173 lb (78.5 kg)  02/25/18 166 lb 8 oz (75.5 kg)  02/21/18 168 lb (76.2 kg)  02/19/18 168 lb (76.2 kg)  01/24/18 165 lb (74.8 kg)        Allergies  Allergen Reactions  .  Ciprofloxacin Itching and Swelling    Facial swelling   . Hydralazine Nausea And Vomiting     Current Outpatient Medications:  .  acetaminophen (TYLENOL) 500 MG tablet, Take 500 mg by mouth every 8 (eight) hours as needed for mild pain or headache., Disp: , Rfl:  .  amLODipine (NORVASC) 2.5 MG tablet, Take 1 tablet by mouth daily., Disp: , Rfl:  .  aspirin EC 81 MG tablet, Take 81 mg by mouth daily., Disp: , Rfl:  .  atorvastatin (LIPITOR) 80 MG tablet, TAKE 1 TABLET BY MOUTH ONCE DAILY (Patient taking differently: daily at 6 PM. ), Disp: 30 tablet, Rfl: 5 .  Blood Glucose Monitoring Suppl (ONE TOUCH ULTRA 2) w/Device KIT, Use to check gluocose daily for type 2 diabetes, Disp: 1 each, Rfl: 0 .  clopidogrel (PLAVIX) 75 MG tablet, TAKE 1 TABLET BY MOUTH ONCE DAILY, Disp: 30 tablet, Rfl: 2 .  glipiZIDE (GLUCOTROL XL) 10 MG 24 hr tablet, TAKE 1 TABLET BY MOUTH ONCE DAILY WITH BREAKFAST, Disp: 30 tablet, Rfl: 12 .  glucose blood test strip, Contour Ascensia test strips and lancets Use to check blood sugar daily for type 2 diabetes, E11.9., Disp: 100 each, Rfl: 4 .  isosorbide mononitrate (IMDUR) 60 MG 24 hr tablet, TAKE 1 TABLET BY MOUTH ONCE DAILY., Disp: 30 tablet, Rfl: 5 .  metoprolol succinate (TOPROL-XL) 50 MG 24 hr tablet, TAKE 1 TABLET BY MOUTH ONCE DAILY WITH OR IMMEDIATELY FOLLOWING A MEAL., Disp: 30  tablet, Rfl: 5 .  pantoprazole (PROTONIX) 40 MG tablet, Take 1 tablet (40 mg total) by mouth daily. (Patient taking differently: Take 40 mg by mouth 2 (two) times daily. ), Disp: 30 tablet, Rfl: 12 .  sodium bicarbonate 650 MG tablet, Take 650 mg by mouth 2 (two) times daily., Disp: , Rfl:  .  tamsulosin (FLOMAX) 0.4 MG CAPS capsule, TAKE 1 CAPSULE BY MOUTH ONCE DAILY AFTERSUPPER, Disp: 30 capsule, Rfl: 5 .  torsemide (DEMADEX) 20 MG tablet, Take 2 tablets (40 mg total) by mouth 2 (two) times daily., Disp: 120 tablet, Rfl: 0 .  traMADol (ULTRAM) 50 MG tablet, Take 1 tablet (50 mg total) by mouth  every 6 (six) hours as needed., Disp: 20 tablet, Rfl: 0 .  colchicine 0.6 MG tablet, Take 1 tablet (0.6 mg total) by mouth daily as needed (gout). (Patient not taking: Reported on 02/26/2018), Disp: 30 tablet, Rfl: 1   Review of Systems  Constitutional: Positive for fatigue and malaise/fatigue. Negative for activity change, appetite change, chills, diaphoresis, fever and unexpected weight change.  Respiratory: Positive for cough, shortness of breath and wheezing. Negative for apnea, choking, chest tightness and stridor.   Cardiovascular: Positive for leg swelling. Negative for chest pain and palpitations.  Gastrointestinal: Negative.   Neurological: Positive for headaches. Negative for dizziness and light-headedness.    Social History   Tobacco Use  . Smoking status: Former Smoker    Packs/day: 1.00    Years: 51.00    Pack years: 51.00    Types: Cigarettes    Last attempt to quit: 06/21/2017    Years since quitting: 0.6  . Smokeless tobacco: Never Used  . Tobacco comment: started smoking at age 5-25. Smoked for 50 years  Substance Use Topics  . Alcohol use: Yes    Alcohol/week: 0.0 standard drinks    Frequency: Never    Comment: 1 every 6 months   Objective:   BP 126/64 (BP Location: Right Arm, Patient Position: Sitting, Cuff Size: Normal)   Pulse 82   Temp 97.7 F (36.5 C) (Oral)   Resp 16   Wt 173 lb (78.5 kg)   SpO2 98%   BMI 27.92 kg/m  Vitals:   02/26/18 0839  BP: 126/64  Pulse: 82  Resp: 16  Temp: 97.7 F (36.5 C)  TempSrc: Oral  SpO2: 98%  Weight: 173 lb (78.5 kg)     Physical Exam   General Appearance:    Alert, cooperative, no distress  Eyes:    PERRL, conjunctiva/corneas clear, EOM's intact       Lungs:     Clear to auscultation bilaterally, respirations unlabored  Heart:    Regular rate and rhythm  Neurologic:   Awake, alert, oriented x 3. No apparent focal neurological           defect.   Ext:  2+ bipedal edema.        Assessment & Plan:      1. Acute on chronic systolic congestive heart failure (HCC) Much better with change to high dose torsemide at recent hospitalization. There was som peribronchial thickening on initial XR, but is not having cough or fever. Can stay off antibiotic as long as he is not coughing.   2. CKD (chronic kidney disease), stage IV ([place]) To follow up Dr. Candiss Norse on 02/28/2018 as scheduled.   3. Type 2 diabetes mellitus with stage 3 chronic kidney disease, without long-term current use of insulin (HCC) Intolerant to [place] due to GI side  effects. Now off of medication over a week. Will start low dose [place]. Is to check sugar daily and bring copy of blood sugar log in one month.  - insulin degludec (TRESIBA FLEXTOUCH) 100 UNIT/ML SOPN FlexTouch Pen; Inject 0.08 mLs (8 Units total) into the skin daily. Increase by 2 units a day if fasting glucose is over 200  Dispense: 6 mL; Refill: 0       Lelon Huh, MD  [facility]

## 2018-02-26 NOTE — Patient Instructions (Addendum)
   You are due for a follow up with your cardiologist. Please call Dr. Anitra Lauth office to schedule a follow up appointment.    Start back on amoxicillin-clavulanate one tablet every other day IF you start to develop a cough of fever   Start Tresiba at 8 units every day. If you fasting glucose if over 200, increase dose by 2 units a day

## 2018-02-28 DIAGNOSIS — E1129 Type 2 diabetes mellitus with other diabetic kidney complication: Secondary | ICD-10-CM | POA: Diagnosis not present

## 2018-02-28 DIAGNOSIS — N185 Chronic kidney disease, stage 5: Secondary | ICD-10-CM | POA: Diagnosis not present

## 2018-02-28 DIAGNOSIS — R809 Proteinuria, unspecified: Secondary | ICD-10-CM | POA: Diagnosis not present

## 2018-02-28 DIAGNOSIS — R600 Localized edema: Secondary | ICD-10-CM | POA: Diagnosis not present

## 2018-03-01 ENCOUNTER — Telehealth: Payer: Self-pay | Admitting: Family Medicine

## 2018-03-01 NOTE — Telephone Encounter (Signed)
Those are pretty good readings. OK to stop the glipizide for now and reduce dose of the [place] to 6 units a day. Let me know if fasting sugars get back above 120.   It will probably go up over the next few weeks since he has stopped the Trulicity.

## 2018-03-01 NOTE — Telephone Encounter (Signed)
Pt's daughter states his blood sugar had been low the past few day.  States yesterday it was 30 (they didn't give him glipizide) and today it is 83 (holding glipizide again today).  She is concerned why it is running low.    Requesting call back to see if it is the new injection mediation that he was put on and is wanting to know what to do.

## 2018-03-01 NOTE — Telephone Encounter (Signed)
Patients daughter Lexine Baton advised and verbally voiced understanding.

## 2018-03-08 ENCOUNTER — Other Ambulatory Visit: Payer: Self-pay

## 2018-03-08 ENCOUNTER — Emergency Department: Payer: Medicare Other

## 2018-03-08 ENCOUNTER — Inpatient Hospital Stay
Admission: EM | Admit: 2018-03-08 | Discharge: 2018-03-13 | DRG: 673 | Disposition: A | Discharge status: Home / Self Care | Payer: Medicare Other | Attending: Internal Medicine | Admitting: Internal Medicine

## 2018-03-08 DIAGNOSIS — E1165 Type 2 diabetes mellitus with hyperglycemia: Secondary | ICD-10-CM | POA: Diagnosis present

## 2018-03-08 DIAGNOSIS — I445 Left posterior fascicular block: Secondary | ICD-10-CM | POA: Diagnosis not present

## 2018-03-08 DIAGNOSIS — R0602 Shortness of breath: Secondary | ICD-10-CM

## 2018-03-08 DIAGNOSIS — Z801 Family history of malignant neoplasm of trachea, bronchus and lung: Secondary | ICD-10-CM

## 2018-03-08 DIAGNOSIS — I132 Hypertensive heart and chronic kidney disease with heart failure and with stage 5 chronic kidney disease, or end stage renal disease: Secondary | ICD-10-CM | POA: Diagnosis present

## 2018-03-08 DIAGNOSIS — I5023 Acute on chronic systolic (congestive) heart failure: Secondary | ICD-10-CM | POA: Diagnosis not present

## 2018-03-08 DIAGNOSIS — Z86018 Personal history of other benign neoplasm: Secondary | ICD-10-CM

## 2018-03-08 DIAGNOSIS — Z951 Presence of aortocoronary bypass graft: Secondary | ICD-10-CM

## 2018-03-08 DIAGNOSIS — K2210 Ulcer of esophagus without bleeding: Secondary | ICD-10-CM | POA: Diagnosis not present

## 2018-03-08 DIAGNOSIS — T82510A Breakdown (mechanical) of surgically created arteriovenous fistula, initial encounter: Secondary | ICD-10-CM | POA: Diagnosis present

## 2018-03-08 DIAGNOSIS — E1151 Type 2 diabetes mellitus with diabetic peripheral angiopathy without gangrene: Secondary | ICD-10-CM | POA: Diagnosis present

## 2018-03-08 DIAGNOSIS — I2510 Atherosclerotic heart disease of native coronary artery without angina pectoris: Secondary | ICD-10-CM | POA: Diagnosis present

## 2018-03-08 DIAGNOSIS — R778 Other specified abnormalities of plasma proteins: Secondary | ICD-10-CM

## 2018-03-08 DIAGNOSIS — R1110 Vomiting, unspecified: Secondary | ICD-10-CM

## 2018-03-08 DIAGNOSIS — Z7982 Long term (current) use of aspirin: Secondary | ICD-10-CM

## 2018-03-08 DIAGNOSIS — M109 Gout, unspecified: Secondary | ICD-10-CM | POA: Diagnosis not present

## 2018-03-08 DIAGNOSIS — Z87891 Personal history of nicotine dependence: Secondary | ICD-10-CM

## 2018-03-08 DIAGNOSIS — T82858A Stenosis of vascular prosthetic devices, implants and grafts, initial encounter: Secondary | ICD-10-CM | POA: Diagnosis not present

## 2018-03-08 DIAGNOSIS — R7989 Other specified abnormal findings of blood chemistry: Secondary | ICD-10-CM

## 2018-03-08 DIAGNOSIS — I509 Heart failure, unspecified: Secondary | ICD-10-CM

## 2018-03-08 DIAGNOSIS — Z7902 Long term (current) use of antithrombotics/antiplatelets: Secondary | ICD-10-CM

## 2018-03-08 DIAGNOSIS — Z961 Presence of intraocular lens: Secondary | ICD-10-CM | POA: Diagnosis present

## 2018-03-08 DIAGNOSIS — J439 Emphysema, unspecified: Secondary | ICD-10-CM | POA: Diagnosis not present

## 2018-03-08 DIAGNOSIS — E785 Hyperlipidemia, unspecified: Secondary | ICD-10-CM | POA: Diagnosis present

## 2018-03-08 DIAGNOSIS — R188 Other ascites: Secondary | ICD-10-CM | POA: Diagnosis present

## 2018-03-08 DIAGNOSIS — I25798 Atherosclerosis of other coronary artery bypass graft(s) with other forms of angina pectoris: Secondary | ICD-10-CM | POA: Diagnosis not present

## 2018-03-08 DIAGNOSIS — Y712 Prosthetic and other implants, materials and accessory cardiovascular devices associated with adverse incidents: Secondary | ICD-10-CM | POA: Diagnosis present

## 2018-03-08 DIAGNOSIS — I25810 Atherosclerosis of coronary artery bypass graft(s) without angina pectoris: Secondary | ICD-10-CM | POA: Diagnosis not present

## 2018-03-08 DIAGNOSIS — I700 Atherosclerosis of aorta: Secondary | ICD-10-CM | POA: Diagnosis not present

## 2018-03-08 DIAGNOSIS — I120 Hypertensive chronic kidney disease with stage 5 chronic kidney disease or end stage renal disease: Secondary | ICD-10-CM | POA: Diagnosis not present

## 2018-03-08 DIAGNOSIS — E1122 Type 2 diabetes mellitus with diabetic chronic kidney disease: Secondary | ICD-10-CM | POA: Diagnosis not present

## 2018-03-08 DIAGNOSIS — Z85820 Personal history of malignant melanoma of skin: Secondary | ICD-10-CM

## 2018-03-08 DIAGNOSIS — Z452 Encounter for adjustment and management of vascular access device: Secondary | ICD-10-CM

## 2018-03-08 DIAGNOSIS — N185 Chronic kidney disease, stage 5: Secondary | ICD-10-CM | POA: Diagnosis not present

## 2018-03-08 DIAGNOSIS — N179 Acute kidney failure, unspecified: Secondary | ICD-10-CM | POA: Diagnosis not present

## 2018-03-08 DIAGNOSIS — Z881 Allergy status to other antibiotic agents status: Secondary | ICD-10-CM

## 2018-03-08 DIAGNOSIS — N186 End stage renal disease: Secondary | ICD-10-CM | POA: Diagnosis not present

## 2018-03-08 DIAGNOSIS — I252 Old myocardial infarction: Secondary | ICD-10-CM | POA: Diagnosis not present

## 2018-03-08 DIAGNOSIS — Z794 Long term (current) use of insulin: Secondary | ICD-10-CM

## 2018-03-08 DIAGNOSIS — K439 Ventral hernia without obstruction or gangrene: Secondary | ICD-10-CM | POA: Diagnosis present

## 2018-03-08 DIAGNOSIS — N189 Chronic kidney disease, unspecified: Secondary | ICD-10-CM | POA: Diagnosis not present

## 2018-03-08 DIAGNOSIS — E119 Type 2 diabetes mellitus without complications: Secondary | ICD-10-CM | POA: Diagnosis not present

## 2018-03-08 DIAGNOSIS — Z820 Family history of epilepsy and other diseases of the nervous system: Secondary | ICD-10-CM

## 2018-03-08 DIAGNOSIS — R140 Abdominal distension (gaseous): Secondary | ICD-10-CM | POA: Diagnosis not present

## 2018-03-08 DIAGNOSIS — J811 Chronic pulmonary edema: Secondary | ICD-10-CM | POA: Diagnosis not present

## 2018-03-08 DIAGNOSIS — Z955 Presence of coronary angioplasty implant and graft: Secondary | ICD-10-CM

## 2018-03-08 DIAGNOSIS — N2581 Secondary hyperparathyroidism of renal origin: Secondary | ICD-10-CM | POA: Diagnosis present

## 2018-03-08 DIAGNOSIS — Z9841 Cataract extraction status, right eye: Secondary | ICD-10-CM

## 2018-03-08 DIAGNOSIS — I429 Cardiomyopathy, unspecified: Secondary | ICD-10-CM | POA: Diagnosis present

## 2018-03-08 DIAGNOSIS — K210 Gastro-esophageal reflux disease with esophagitis: Secondary | ICD-10-CM | POA: Diagnosis present

## 2018-03-08 DIAGNOSIS — R809 Proteinuria, unspecified: Secondary | ICD-10-CM | POA: Diagnosis not present

## 2018-03-08 DIAGNOSIS — Z87442 Personal history of urinary calculi: Secondary | ICD-10-CM

## 2018-03-08 DIAGNOSIS — Z992 Dependence on renal dialysis: Secondary | ICD-10-CM | POA: Diagnosis not present

## 2018-03-08 DIAGNOSIS — Z888 Allergy status to other drugs, medicaments and biological substances status: Secondary | ICD-10-CM

## 2018-03-08 DIAGNOSIS — I517 Cardiomegaly: Secondary | ICD-10-CM | POA: Diagnosis not present

## 2018-03-08 DIAGNOSIS — Z86010 Personal history of colonic polyps: Secondary | ICD-10-CM

## 2018-03-08 DIAGNOSIS — D631 Anemia in chronic kidney disease: Secondary | ICD-10-CM | POA: Diagnosis not present

## 2018-03-08 DIAGNOSIS — R109 Unspecified abdominal pain: Secondary | ICD-10-CM | POA: Diagnosis not present

## 2018-03-08 DIAGNOSIS — Z79899 Other long term (current) drug therapy: Secondary | ICD-10-CM

## 2018-03-08 DIAGNOSIS — J309 Allergic rhinitis, unspecified: Secondary | ICD-10-CM | POA: Diagnosis present

## 2018-03-08 DIAGNOSIS — Z8719 Personal history of other diseases of the digestive system: Secondary | ICD-10-CM

## 2018-03-08 DIAGNOSIS — N184 Chronic kidney disease, stage 4 (severe): Secondary | ICD-10-CM | POA: Diagnosis not present

## 2018-03-08 DIAGNOSIS — Z9842 Cataract extraction status, left eye: Secondary | ICD-10-CM

## 2018-03-08 DIAGNOSIS — I110 Hypertensive heart disease with heart failure: Secondary | ICD-10-CM | POA: Diagnosis not present

## 2018-03-08 DIAGNOSIS — N183 Chronic kidney disease, stage 3 (moderate): Secondary | ICD-10-CM

## 2018-03-08 DIAGNOSIS — Z8614 Personal history of Methicillin resistant Staphylococcus aureus infection: Secondary | ICD-10-CM

## 2018-03-08 DIAGNOSIS — G4700 Insomnia, unspecified: Secondary | ICD-10-CM | POA: Diagnosis present

## 2018-03-08 DIAGNOSIS — T82898A Other specified complication of vascular prosthetic devices, implants and grafts, initial encounter: Secondary | ICD-10-CM | POA: Diagnosis not present

## 2018-03-08 NOTE — ED Triage Notes (Signed)
Pt arrives to ED via POV from home with c/o [facility] and CP x1 day. Pt's daughter reports 2-4lb weight gain since this morning. Pt reports productive cough and wheezing. Pt is stage IV kidney failure and h/x of CHF. Pt does not use home O2.

## 2018-03-08 NOTE — ED Notes (Signed)
Patient transported to X-ray 

## 2018-03-09 ENCOUNTER — Inpatient Hospital Stay: Payer: Medicare Other

## 2018-03-09 ENCOUNTER — Other Ambulatory Visit: Payer: Self-pay

## 2018-03-09 ENCOUNTER — Inpatient Hospital Stay
Admit: 2018-03-09 | Discharge: 2018-03-09 | Disposition: A | Discharge status: Home / Self Care | Payer: Medicare Other | Attending: Internal Medicine | Admitting: Internal Medicine

## 2018-03-09 ENCOUNTER — Encounter: Payer: Self-pay | Admitting: Internal Medicine

## 2018-03-09 DIAGNOSIS — R188 Other ascites: Secondary | ICD-10-CM | POA: Diagnosis present

## 2018-03-09 DIAGNOSIS — I5023 Acute on chronic systolic (congestive) heart failure: Secondary | ICD-10-CM | POA: Diagnosis present

## 2018-03-09 DIAGNOSIS — J439 Emphysema, unspecified: Secondary | ICD-10-CM | POA: Diagnosis present

## 2018-03-09 DIAGNOSIS — R0602 Shortness of breath: Secondary | ICD-10-CM | POA: Diagnosis present

## 2018-03-09 DIAGNOSIS — N179 Acute kidney failure, unspecified: Secondary | ICD-10-CM | POA: Diagnosis present

## 2018-03-09 DIAGNOSIS — Y712 Prosthetic and other implants, materials and accessory cardiovascular devices associated with adverse incidents: Secondary | ICD-10-CM | POA: Diagnosis present

## 2018-03-09 DIAGNOSIS — E785 Hyperlipidemia, unspecified: Secondary | ICD-10-CM | POA: Diagnosis present

## 2018-03-09 DIAGNOSIS — Z992 Dependence on renal dialysis: Secondary | ICD-10-CM | POA: Diagnosis not present

## 2018-03-09 DIAGNOSIS — M109 Gout, unspecified: Secondary | ICD-10-CM | POA: Diagnosis present

## 2018-03-09 DIAGNOSIS — T82858A Stenosis of vascular prosthetic devices, implants and grafts, initial encounter: Secondary | ICD-10-CM | POA: Diagnosis not present

## 2018-03-09 DIAGNOSIS — E1122 Type 2 diabetes mellitus with diabetic chronic kidney disease: Secondary | ICD-10-CM | POA: Diagnosis present

## 2018-03-09 DIAGNOSIS — I132 Hypertensive heart and chronic kidney disease with heart failure and with stage 5 chronic kidney disease, or end stage renal disease: Secondary | ICD-10-CM | POA: Diagnosis present

## 2018-03-09 DIAGNOSIS — T82898A Other specified complication of vascular prosthetic devices, implants and grafts, initial encounter: Secondary | ICD-10-CM | POA: Diagnosis not present

## 2018-03-09 DIAGNOSIS — D631 Anemia in chronic kidney disease: Secondary | ICD-10-CM | POA: Diagnosis present

## 2018-03-09 DIAGNOSIS — E1165 Type 2 diabetes mellitus with hyperglycemia: Secondary | ICD-10-CM | POA: Diagnosis present

## 2018-03-09 DIAGNOSIS — I252 Old myocardial infarction: Secondary | ICD-10-CM | POA: Diagnosis not present

## 2018-03-09 DIAGNOSIS — I700 Atherosclerosis of aorta: Secondary | ICD-10-CM | POA: Diagnosis present

## 2018-03-09 DIAGNOSIS — E1151 Type 2 diabetes mellitus with diabetic peripheral angiopathy without gangrene: Secondary | ICD-10-CM | POA: Diagnosis present

## 2018-03-09 DIAGNOSIS — I445 Left posterior fascicular block: Secondary | ICD-10-CM | POA: Diagnosis present

## 2018-03-09 DIAGNOSIS — K2210 Ulcer of esophagus without bleeding: Secondary | ICD-10-CM | POA: Diagnosis present

## 2018-03-09 DIAGNOSIS — I25810 Atherosclerosis of coronary artery bypass graft(s) without angina pectoris: Secondary | ICD-10-CM | POA: Diagnosis present

## 2018-03-09 DIAGNOSIS — N185 Chronic kidney disease, stage 5: Secondary | ICD-10-CM | POA: Diagnosis not present

## 2018-03-09 DIAGNOSIS — T82510A Breakdown (mechanical) of surgically created arteriovenous fistula, initial encounter: Secondary | ICD-10-CM | POA: Diagnosis present

## 2018-03-09 DIAGNOSIS — N186 End stage renal disease: Secondary | ICD-10-CM | POA: Diagnosis present

## 2018-03-09 DIAGNOSIS — K210 Gastro-esophageal reflux disease with esophagitis: Secondary | ICD-10-CM | POA: Diagnosis present

## 2018-03-09 DIAGNOSIS — N2581 Secondary hyperparathyroidism of renal origin: Secondary | ICD-10-CM | POA: Diagnosis present

## 2018-03-09 DIAGNOSIS — I2510 Atherosclerotic heart disease of native coronary artery without angina pectoris: Secondary | ICD-10-CM | POA: Diagnosis present

## 2018-03-09 DIAGNOSIS — K439 Ventral hernia without obstruction or gangrene: Secondary | ICD-10-CM | POA: Diagnosis present

## 2018-03-09 DIAGNOSIS — I429 Cardiomyopathy, unspecified: Secondary | ICD-10-CM | POA: Diagnosis present

## 2018-03-09 DIAGNOSIS — J309 Allergic rhinitis, unspecified: Secondary | ICD-10-CM | POA: Diagnosis present

## 2018-03-09 LAB — CBC
HEMATOCRIT: 28.7 % — AB (ref 39.0–52.0)
Hemoglobin: 9 g/dL — ABNORMAL LOW (ref 13.0–17.0)
MCH: 28 pg (ref 26.0–34.0)
MCHC: 31.4 g/dL (ref 30.0–36.0)
MCV: 89.1 fL (ref 80.0–100.0)
NRBC: 0 % (ref 0.0–0.2)
PLATELETS: 365 10*3/uL (ref 150–400)
RBC: 3.22 MIL/uL — AB (ref 4.22–5.81)
RDW: 14.6 % (ref 11.5–15.5)
WBC: 8.3 10*3/uL (ref 4.0–10.5)

## 2018-03-09 LAB — HEPATIC FUNCTION PANEL
ALBUMIN: 2.9 g/dL — AB (ref 3.5–5.0)
ALK PHOS: 98 U/L (ref 38–126)
ALT: 15 U/L (ref 0–44)
AST: 16 U/L (ref 15–41)
Bilirubin, Direct: <0.1 mg/dL (ref 0.0–0.2)
TOTAL PROTEIN: 6.2 g/dL — AB (ref 6.5–8.1)
Total Bilirubin: 0.4 mg/dL (ref 0.3–1.2)

## 2018-03-09 LAB — CREATININE, URINE, RANDOM: CREATININE, URINE: 42 mg/dL

## 2018-03-09 LAB — BASIC METABOLIC PANEL
ANION GAP: 7 (ref 5–15)
BUN: 66 mg/dL — ABNORMAL HIGH (ref 8–23)
CO2: 24 mmol/L (ref 22–32)
CREATININE: 4.91 mg/dL — AB (ref 0.61–1.24)
Calcium: 8.2 mg/dL — ABNORMAL LOW (ref 8.9–10.3)
Chloride: 104 mmol/L (ref 98–111)
GFR calc non Af Amer: 11 mL/min — ABNORMAL LOW (ref >60)
GFR, EST AFRICAN AMERICAN: 12 mL/min — AB (ref >60)
Glucose, Bld: 224 mg/dL — ABNORMAL HIGH (ref 70–99)
Potassium: 4.6 mmol/L (ref 3.5–5.1)
SODIUM: 135 mmol/L (ref 135–145)

## 2018-03-09 LAB — GLUCOSE, CAPILLARY
GLUCOSE-CAPILLARY: 134 mg/dL — AB (ref 70–99)
GLUCOSE-CAPILLARY: 145 mg/dL — AB (ref 70–99)
GLUCOSE-CAPILLARY: 146 mg/dL — AB (ref 70–99)
Glucose-Capillary: 94 mg/dL (ref 70–99)

## 2018-03-09 LAB — PROTEIN, URINE, RANDOM: TOTAL PROTEIN, URINE: 583 mg/dL

## 2018-03-09 LAB — BRAIN NATRIURETIC PEPTIDE: B NATRIURETIC PEPTIDE 5: 2611 pg/mL — AB (ref 0.0–100.0)

## 2018-03-09 LAB — TROPONIN I
Troponin I: 0.08 ng/mL (ref <0.03)
Troponin I: 0.08 ng/mL (ref <0.03)
Troponin I: 0.09 ng/mL (ref <0.03)

## 2018-03-09 LAB — PHOSPHORUS: PHOSPHORUS: 6 mg/dL — AB (ref 2.5–4.6)

## 2018-03-09 LAB — NA AND K (SODIUM & POTASSIUM), RAND UR
Potassium Urine: 23 mmol/L
Sodium, Ur: 96 mmol/L

## 2018-03-09 LAB — PREALBUMIN: Prealbumin: 22.6 mg/dL (ref 18–38)

## 2018-03-09 LAB — MAGNESIUM: MAGNESIUM: 2.2 mg/dL (ref 1.7–2.4)

## 2018-03-09 MED ORDER — SENNOSIDES-DOCUSATE SODIUM 8.6-50 MG PO TABS: 1.0000 | ORAL_TABLET | Freq: Every evening | ORAL | Status: DC | PRN

## 2018-03-09 MED ORDER — BUMETANIDE 0.25 MG/ML IJ SOLN
2.0000 mg | Freq: Two times a day (BID) | INTRAMUSCULAR | Status: AC
  Administered 2018-03-09 (×2): 2 mg via INTRAVENOUS
  Filled 2018-03-09 (×2): qty 8

## 2018-03-09 MED ORDER — ACETAMINOPHEN 325 MG PO TABS
650.0000 mg | ORAL_TABLET | ORAL | Status: DC | PRN
  Administered 2018-03-09 – 2018-03-12 (×4): 650 mg via ORAL
  Filled 2018-03-09 (×4): qty 2

## 2018-03-09 MED ORDER — ISOSORBIDE MONONITRATE ER 60 MG PO TB24
60.0000 mg | ORAL_TABLET | Freq: Every day | ORAL | Status: DC
  Administered 2018-03-09 – 2018-03-13 (×5): 60 mg via ORAL
  Filled 2018-03-09 (×5): qty 1

## 2018-03-09 MED ORDER — METOPROLOL SUCCINATE ER 50 MG PO TB24
50.0000 mg | ORAL_TABLET | Freq: Every day | ORAL | Status: DC
  Administered 2018-03-09 – 2018-03-13 (×5): 50 mg via ORAL
  Filled 2018-03-09 (×5): qty 1

## 2018-03-09 MED ORDER — BUMETANIDE 0.25 MG/ML IJ SOLN
1.0000 mg | Freq: Once | INTRAMUSCULAR | Status: AC
  Administered 2018-03-09: 1 mg via INTRAVENOUS
  Filled 2018-03-09: qty 4

## 2018-03-09 MED ORDER — SODIUM CHLORIDE 0.9% FLUSH: 3.0000 mL | INTRAVENOUS | Status: DC | PRN

## 2018-03-09 MED ORDER — HEPARIN SODIUM (PORCINE) 5000 UNIT/ML IJ SOLN
5000.0000 [IU] | Freq: Three times a day (TID) | INTRAMUSCULAR | Status: DC
  Administered 2018-03-09 – 2018-03-13 (×11): 5000 [IU] via SUBCUTANEOUS
  Filled 2018-03-09 (×11): qty 1

## 2018-03-09 MED ORDER — AMLODIPINE BESYLATE 5 MG PO TABS
2.5000 mg | ORAL_TABLET | Freq: Every day | ORAL | Status: DC
  Administered 2018-03-09 – 2018-03-11 (×3): 2.5 mg via ORAL
  Filled 2018-03-09 (×4): qty 1

## 2018-03-09 MED ORDER — CALCIUM ACETATE (PHOS BINDER) 667 MG PO CAPS
667.0000 mg | ORAL_CAPSULE | Freq: Three times a day (TID) | ORAL | Status: DC
  Administered 2018-03-09 – 2018-03-13 (×9): 667 mg via ORAL
  Filled 2018-03-09 (×10): qty 1

## 2018-03-09 MED ORDER — SODIUM BICARBONATE 650 MG PO TABS
650.0000 mg | ORAL_TABLET | Freq: Two times a day (BID) | ORAL | Status: DC
  Administered 2018-03-09 – 2018-03-13 (×7): 650 mg via ORAL
  Filled 2018-03-09 (×7): qty 1

## 2018-03-09 MED ORDER — INSULIN ASPART 100 UNIT/ML ~~LOC~~ SOLN
2.0000 [IU] | Freq: Three times a day (TID) | SUBCUTANEOUS | Status: DC
  Administered 2018-03-09 – 2018-03-13 (×9): 2 [IU] via SUBCUTANEOUS
  Filled 2018-03-09 (×9): qty 1

## 2018-03-09 MED ORDER — CLOPIDOGREL BISULFATE 75 MG PO TABS
75.0000 mg | ORAL_TABLET | Freq: Every day | ORAL | Status: DC
  Administered 2018-03-09 – 2018-03-13 (×5): 75 mg via ORAL
  Filled 2018-03-09 (×5): qty 1

## 2018-03-09 MED ORDER — ASPIRIN 81 MG PO CHEW
81.0000 mg | CHEWABLE_TABLET | Freq: Every day | ORAL | Status: DC
  Administered 2018-03-09: 81 mg via ORAL
  Filled 2018-03-09: qty 1

## 2018-03-09 MED ORDER — INSULIN DEGLUDEC 100 UNIT/ML ~~LOC~~ SOPN: 6.0000 [IU] | PEN_INJECTOR | Freq: Every day | SUBCUTANEOUS | Status: DC

## 2018-03-09 MED ORDER — TORSEMIDE 20 MG PO TABS
40.0000 mg | ORAL_TABLET | Freq: Two times a day (BID) | ORAL | Status: DC
  Administered 2018-03-10 – 2018-03-13 (×5): 40 mg via ORAL
  Filled 2018-03-09 (×5): qty 2

## 2018-03-09 MED ORDER — INSULIN ASPART 100 UNIT/ML ~~LOC~~ SOLN: 0.0000 [IU] | Freq: Every day | SUBCUTANEOUS | Status: DC

## 2018-03-09 MED ORDER — IOPAMIDOL (ISOVUE-300) INJECTION 61%
15.0000 mL | INTRAVENOUS | Status: AC
  Administered 2018-03-09 (×2): 15 mL via ORAL

## 2018-03-09 MED ORDER — NITROGLYCERIN 0.4 MG SL SUBL: 0.4000 mg | SUBLINGUAL_TABLET | SUBLINGUAL | Status: DC | PRN

## 2018-03-09 MED ORDER — MORPHINE SULFATE (PF) 2 MG/ML IV SOLN
2.0000 mg | INTRAVENOUS | Status: DC | PRN
  Administered 2018-03-10 – 2018-03-13 (×8): 2 mg via INTRAVENOUS
  Filled 2018-03-09 (×8): qty 1

## 2018-03-09 MED ORDER — ONDANSETRON HCL 4 MG/2ML IJ SOLN
4.0000 mg | Freq: Four times a day (QID) | INTRAMUSCULAR | Status: DC | PRN
  Administered 2018-03-11 – 2018-03-13 (×2): 4 mg via INTRAVENOUS
  Filled 2018-03-09 (×2): qty 2

## 2018-03-09 MED ORDER — SODIUM CHLORIDE 0.9% FLUSH
3.0000 mL | Freq: Two times a day (BID) | INTRAVENOUS | Status: DC
  Administered 2018-03-09 – 2018-03-13 (×10): 3 mL via INTRAVENOUS

## 2018-03-09 MED ORDER — SODIUM CHLORIDE 0.9 % IV SOLN: 250.0000 mL | INTRAVENOUS | Status: DC | PRN

## 2018-03-09 MED ORDER — BISACODYL 5 MG PO TBEC: 5.0000 mg | DELAYED_RELEASE_TABLET | Freq: Every day | ORAL | Status: DC | PRN

## 2018-03-09 MED ORDER — TRAZODONE HCL 100 MG PO TABS
100.0000 mg | ORAL_TABLET | Freq: Every day | ORAL | Status: DC
  Administered 2018-03-09 – 2018-03-12 (×4): 100 mg via ORAL
  Filled 2018-03-09 (×4): qty 1

## 2018-03-09 MED ORDER — TAMSULOSIN HCL 0.4 MG PO CAPS
0.4000 mg | ORAL_CAPSULE | Freq: Every day | ORAL | Status: DC
  Administered 2018-03-09 – 2018-03-12 (×4): 0.4 mg via ORAL
  Filled 2018-03-09 (×4): qty 1

## 2018-03-09 MED ORDER — INSULIN ASPART 100 UNIT/ML ~~LOC~~ SOLN
0.0000 [IU] | Freq: Three times a day (TID) | SUBCUTANEOUS | Status: DC
  Administered 2018-03-09 – 2018-03-10 (×3): 2 [IU] via SUBCUTANEOUS
  Administered 2018-03-10: 3 [IU] via SUBCUTANEOUS
  Administered 2018-03-11: 2 [IU] via SUBCUTANEOUS
  Administered 2018-03-11: 3 [IU] via SUBCUTANEOUS
  Administered 2018-03-12 – 2018-03-13 (×3): 2 [IU] via SUBCUTANEOUS
  Filled 2018-03-09 (×9): qty 1

## 2018-03-09 MED ORDER — PANTOPRAZOLE SODIUM 40 MG PO TBEC
40.0000 mg | DELAYED_RELEASE_TABLET | Freq: Every day | ORAL | Status: DC
  Administered 2018-03-09 – 2018-03-13 (×4): 40 mg via ORAL
  Filled 2018-03-09 (×5): qty 1

## 2018-03-09 MED ORDER — INSULIN GLARGINE 100 UNIT/ML ~~LOC~~ SOLN
4.0000 [IU] | Freq: Every day | SUBCUTANEOUS | Status: DC
  Administered 2018-03-09 – 2018-03-13 (×4): 4 [IU] via SUBCUTANEOUS
  Filled 2018-03-09 (×5): qty 0.04

## 2018-03-09 MED ORDER — ATORVASTATIN CALCIUM 20 MG PO TABS
80.0000 mg | ORAL_TABLET | Freq: Every day | ORAL | Status: DC
  Administered 2018-03-09 – 2018-03-12 (×4): 80 mg via ORAL
  Filled 2018-03-09 (×4): qty 4

## 2018-03-09 NOTE — ED Notes (Signed)
Date and time results received: 03/09/18 1:01 AM  Test: Troponin Critical Value: Trop: 0.08  Name of Provider Notified: Dr Beather Arbour  Orders Received? Or Actions Taken?: Acknowledged

## 2018-03-09 NOTE — Progress Notes (Signed)
[place] [facility] [place] NAME: Thomas [name]    MR#:  [id number]  DATE OF BIRTH:  11-Mar-1946  SUBJECTIVE:   Patient presented to the hospital secondary to shortness of breath and volume overload.  Renal function has progressively gotten worse as an outpatient and patient to be initiated on hemodialysis.  Patient's family is at bedside.  Patient complaining of significant shortness of breath on minimal exertion with worsening lower extremity edema.  He also complains of some vague abdominal pain.  REVIEW OF SYSTEMS:    Review of Systems  Constitutional: Negative for chills and fever.  HENT: Negative for congestion and tinnitus.   Eyes: Negative for blurred vision and double vision.  Respiratory: Positive for shortness of breath. Negative for cough and wheezing.   Cardiovascular: Positive for leg swelling and PND. Negative for chest pain and orthopnea.  Gastrointestinal: Positive for abdominal pain. Negative for diarrhea, nausea and vomiting.  Genitourinary: Negative for dysuria and hematuria.  Neurological: Negative for dizziness, sensory change and focal weakness.  All other systems reviewed and are negative.   Nutrition: Renal/Carb modified Tolerating Diet: yes Tolerating PT: Await Eval.  DRUG ALLERGIES:   Allergies  Allergen Reactions  . Ciprofloxacin Itching and Swelling    Facial swelling   . Trulicity [Dulaglutide]     Bloating  . Hydralazine Nausea And Vomiting    VITALS:  Blood pressure (!) 143/75, pulse 86, temperature (!) 97.4 F (36.3 C), temperature source Oral, resp. rate (!) 24, height 5\' 6"  (1.676 m), weight 75.3 kg, SpO2 100 %.  PHYSICAL EXAMINATION:   Physical Exam  GENERAL:  72 y.o.-year-old patient lying in bed in no acute distress.  EYES: Pupils equal, round, reactive to light and accommodation. No scleral icterus. Extraocular muscles intact.  HEENT: Head atraumatic, normocephalic. Oropharynx and  nasopharynx clear.  NECK:  Supple, no jugular venous distention. No thyroid enlargement, no tenderness.  LUNGS: Normal breath sounds bilaterally, no wheezing, bibasiar rales, rhonchi. No use of accessory muscles of respiration.  CARDIOVASCULAR: S1, S2 normal. No murmurs, rubs, or gallops.  ABDOMEN: Soft, nontender, nondistended. + mid abdomen scare with ventral hernia Bowel sounds present. No organomegaly or mass.  EXTREMITIES: No cyanosis, clubbing, + 2 edema from ankles to knees b/l.  NEUROLOGIC: Cranial nerves II through XII are intact. No focal Motor or sensory deficits b/l.  Globally weak PSYCHIATRIC: The patient is alert and oriented x 3.  SKIN: No obvious rash, lesion, or ulcer.   Left upper extremity AV fistula with good bruit and thrill.  LABORATORY PANEL:   CBC Recent Labs  Lab 03/09/18 0012  WBC 8.3  HGB 9.0*  HCT 28.7*  PLT 365   ------------------------------------------------------------------------------------------------------------------  Chemistries  Recent Labs  Lab 03/09/18 0012 03/09/18 0243  NA 135  --   K 4.6  --   CL 104  --   CO2 24  --   GLUCOSE 224*  --   BUN 66*  --   CREATININE 4.91*  --   CALCIUM 8.2*  --   MG  --  2.2  AST  --  16  ALT  --  15  ALKPHOS  --  98  BILITOT  --  0.4   ------------------------------------------------------------------------------------------------------------------  Cardiac Enzymes Recent Labs  Lab 03/09/18 0605  TROPONINI 0.09*   ------------------------------------------------------------------------------------------------------------------  RADIOLOGY:  Ct Abdomen Pelvis Wo Contrast  Result Date: 03/09/2018 CLINICAL DATA:  Abdominal distension. EXAM: CT ABDOMEN AND PELVIS WITHOUT CONTRAST  TECHNIQUE: Multidetector CT imaging of the abdomen and pelvis was performed following the standard protocol without IV contrast. COMPARISON:  CT scan of July 10, 2017. FINDINGS: Lower chest: Mild right pleural  effusion is noted. Hepatobiliary: No focal liver abnormality is seen. No gallstones, gallbladder wall thickening, or biliary dilatation. Pancreas: Unremarkable. No pancreatic ductal dilatation or surrounding inflammatory changes. Spleen: Normal in size without focal abnormality. Adrenals/Urinary Tract: Adrenal glands are unremarkable. Stable left renal cyst is noted. No hydronephrosis or renal obstruction is noted. No renal or ureteral calculi are noted. Urinary bladder is unremarkable. Stomach/Bowel: Stomach is within normal limits. Appendix appears normal. No evidence of bowel wall thickening, distention, or inflammatory changes. Vascular/Lymphatic: Aortic atherosclerosis. No enlarged abdominal or pelvic lymph nodes. Reproductive: Stable mild prostatic enlargement is noted. Other: Multiple fat containing ventral hernias are noted in the periumbilical and supraumbilical regions. Musculoskeletal: No acute or significant osseous findings. IMPRESSION: Mild right pleural effusion. Multiple fat containing ventral hernias are noted in the periumbilical and supraumbilical regions. Stable mild prostatic enlargement. No acute abnormality seen in the abdomen or pelvis. Aortic Atherosclerosis (ICD10-I70.0). Electronically Signed   By: Marijo Conception, M.D.   On: 03/09/2018 13:58   Dg Chest 2 View  Result Date: 03/08/2018 CLINICAL DATA:  72 y/o M; 1 day of shortness of breath and chest pain. EXAM: CHEST - 2 VIEW COMPARISON:  02/24/2018 chest radiograph. FINDINGS: Stable cardiomegaly given projection and technique. 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 Aortic atherosclerosis with calcification. Reticular and peripheral linear opacities of the lungs. No focal consolidation. Blunted posterior costal diaphragmatic angles. No acute osseous abnormality is evident. IMPRESSION: 1. Interstitial pulmonary edema and possible trace effusions. 2. Stable cardiomegaly given projection and technique. Electronically Signed    By: Kristine Garbe M.D.   On: 03/08/2018 23:55   US Renal  Result Date: 03/09/2018 CLINICAL DATA:  Acute kidney injury EXAM: RENAL / URINARY TRACT ULTRASOUND COMPLETE COMPARISON:  CT 07/10/2017.  Ultrasound 05/27/2017 FINDINGS: Right Kidney: Renal measurements: 9.1 x 4.7 x 5.3 cm = volume: 118 mL. Small cyst in the upper pole measures up to 1 cm. Cortical thinning and increased echotexture. No hydronephrosis. Left Kidney: Renal measurements: 10.4 x 5.6 x 6.1 cm = volume: 184 mL. 2.3 cm cyst in the upper pole. Cortical thinning and increased echotexture diffusely throughout the left kidney. No hydronephrosis. Bladder: Appears normal for degree of bladder distention. IMPRESSION: Increased echotexture and cortical thinning within the kidneys bilaterally compatible with chronic medical renal disease. No hydronephrosis. No acute findings or change since prior ultrasound. Electronically Signed   By: Rolm Baptise M.D.   On: 03/09/2018 09:39   [place] Ascites (abdomen Limited)  Result Date: 03/09/2018 CLINICAL DATA:  Abdominal distension EXAM: LIMITED ABDOMEN ULTRASOUND FOR ASCITES TECHNIQUE: Limited ultrasound survey for ascites was performed in all four abdominal quadrants. COMPARISON:  None. FINDINGS: Four quadrant survey in the abdomen and pelvis demonstrates no visible ascites. IMPRESSION: No ascites seen. Electronically Signed   By: Rolm Baptise M.D.   On: 03/09/2018 09:37     ASSESSMENT AND PLAN:   72 year old male with past medical history of CKD stage IV, diabetes, hypertension, hyperlipidemia, previous history of MI, peripheral vascular disease, coronary disease, GERD who presented to the hospital due to shortness of breath, worsening lower extremity edema.  1.  CHF/volume overload-this is a cause of patient's worsening lower some edema and shortness of breath.  Patient has underlying CKD and was not responding to high-dose oral diuretics.  Patient was on oral torsemide and Bumex. - Seen  by nephrology and plan on initiating hemodialysis today. -Continue fluid removal as per hemodialysis.  Continue Toprol, Imdur  2.  CKD stage IV-patient has not progressed to ESRD. -Patient to be initiated on hemodialysis while in the hospital today. -Continue further care as per nephrology.  3.  Abdominal pain - patient was having some vague abdominal pain, CT abdomen pelvis obtained today showing fat-containing ventral hernia but they are not incarcerated.  No acute surgical indication.  Patient can follow-up as an outpatient.  No other acute intra-abdominal pathology.  4.  Secondary hyperparathyroidism-continue PhosLo  5.  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  Blood sugar stable.  6.  GERD-continue Protonix.  7.  History of peripheral vascular disease-continue atorvastatin, Plavix.  8.  Essential hypertension-continue Norvasc, metoprolol, Imdur.   All the records are reviewed and case discussed with Care Management/Social Worker. Management plans discussed with the patient, family and they are in agreement.  CODE STATUS: Full code  DVT Prophylaxis: Heparin subcu  TOTAL TIME TAKING CARE OF THIS PATIENT: 40 minutes.   POSSIBLE D/C IN 1-2 DAYS, DEPENDING ON CLINICAL CONDITION.   Henreitta Leber M.D on 03/09/2018 at 3:30 PM  Between 7am to 6pm - Pager - [phone]  After 6pm go to [email or web address] - Proofreader  [facility]  Office  [phone]  CC: Primary care physician; Birdie Sons, MD

## 2018-03-09 NOTE — Progress Notes (Signed)
Pharmacy consult noted for Bumex IV x 2 doses followed by torsemide PO. Orders entered. Pharmacy will sign off.  Sahirah Rudell A. [place], [place].D., BCPS Clinical pharmacist 03/09/2018 09:56

## 2018-03-09 NOTE — ED Provider Notes (Signed)
[facility] Emergency Department Provider Note   ____________________________________________   First MD Initiated Contact with Patient 03/09/18 [phone]     (approximate)  I have reviewed the triage vital signs and the nursing notes.   HISTORY  Chief Complaint Shortness of Breath    HPI Thomas [name] is a 72 y.o. male who presents to the ED from home with a chief complaint of shortness of breath and chest pain.  Patient has a history of stage IV kidney failure and CHF who was hospitalized approximately 2 weeks ago for CHF exacerbation.  At that time he was taken off of his Lasix and started on torsemide 20 mg twice daily.  Daughter states patient has had a 2 to 4 pound weight gain just today.  Patient cannot lay back secondary to breathing difficulty.  Also having chest tightness and productive cough.  Family noticed generalized edema particularly in his abdomen as well as bilateral lower extremities.  Patient denies associated fever, chills, nausea, vomiting, diarrhea.  Denies recent travel or trauma.   Past Medical History:  Diagnosis Date  . Allergy   . Arthritis   . CAD (coronary artery disease)   . Chronic kidney disease   . Diabetes mellitus without complication ([place])   . Dyspnea   . Erosive esophagitis   . GERD (gastroesophageal reflux disease)   . Gouty arthropathy   . History of colonic polyps   . History of kidney stones   . History of MRSA infection   . Hyperkalemia   . Hyperlipidemia   . Hypertension   . Melanoma ([place])   . Myocardial infarction ([place]) 1986  . Peripheral vascular disease ([place])   . 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 Trigger finger of left hand   . Ureteral tumor     Patient Active Problem List   Diagnosis Date Noted  . Acute respiratory failure ([place]) 02/24/2018  . PAD (peripheral artery disease) ([place]) 11/17/2017  . Chest pain 07/17/2017  . Emphysema lung ([place]) 07/17/2017  . Acute on chronic  systolic CHF (congestive heart failure), NYHA class 2 ([place]) 06/22/2017  . CKD (chronic kidney disease), stage IV ([place]) 06/20/2017  . Systolic CHF ([place]) 21/19/4174  . Acute on chronic renal failure ([place]) 05/24/2017  . CAD (coronary artery disease) of artery bypass graft 10/25/2016  . Abnormal ankle brachial index (ABI) 10/13/2016  . Intermittent claudication ([place]) 09/30/2016  . PVC (premature ventricular contraction) 09/30/2016  . Hyperlipidemia 09/30/2016  . Aortic atherosclerosis ([place]) 06/17/2016  . Duodenitis 11/24/2014  . Erosive esophagitis 11/24/2014  . Personal history of methicillin resistant Staphylococcus aureus 11/24/2014  . High potassium 11/24/2014  . Melanoma of skin ([place]) 11/24/2014  . Acquired trigger finger 11/24/2014  . 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 DM (diabetes mellitus), type 2 with renal complications ([place]) 11/29/4816  . History of smoking 30 or more pack years 08/26/2009  . Allergic rhinitis 08/06/2006  . 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 History of colon polyps 05/09/2003  . 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 Essential (primary) hypertension 06/09/1995    Past Surgical History:  Procedure Laterality Date  . AV FISTULA PLACEMENT Left 12/07/2017   Procedure: ARTERIOVENOUS (AV) FISTULA CREATION ( BRACHIOBASILIC STAGE );  Surgeon: Algernon Huxley, MD;  Location: [facility] ORS;  Service: Vascular;  Laterality: Left;  . CATARACT EXTRACTION    . CORONARY ARTERY BYPASS GRAFT  1996  .  CORONARY STENT INTERVENTION N/A 05/29/2017   Procedure: CORONARY STENT INTERVENTION;  Surgeon: Isaias Cowman, MD;  Location: [place] [facility];  Service: Cardiovascular;  Laterality: N/A;  . EXCISION Ureteral Tumor     (benign) Dr. Quillian Quince  . EYE SURGERY Bilateral    Cataract Extraction with IOL  . LEFT HEART CATH AND CORONARY ANGIOGRAPHY N/A 10/18/2016   Procedure: Left Heart Cath and Coronary Angiography;  Surgeon:  Isaias Cowman, MD;  Location: [place] [facility];  Service: Cardiovascular;  Laterality: N/A;  . LEFT HEART CATH AND CORONARY ANGIOGRAPHY N/A 05/29/2017   Procedure: LEFT HEART CATH AND CORONARY ANGIOGRAPHY;  Surgeon: Isaias Cowman, MD;  Location: [facility];  Service: Cardiovascular;  Laterality: N/A;  . LOWER EXTREMITY ANGIOGRAPHY Right 10/31/2016   Procedure: Lower Extremity Angiography;  Surgeon: Algernon Huxley, MD;  Location: [place] [facility];  Service: Cardiovascular;  Laterality: Right;  . LOWER EXTREMITY ANGIOGRAPHY Left 11/14/2016   Procedure: Lower Extremity Angiography;  Surgeon: Algernon Huxley, MD;  Location: [facility];  Service: Cardiovascular;  Laterality: Left;  . LOWER EXTREMITY INTERVENTION  11/14/2016   Procedure: Lower Extremity Intervention;  Surgeon: Algernon Huxley, MD;  Location: [place] [facility];  Service: Cardiovascular;;  . RETINAL DETACHMENT SURGERY Right November 2107    Prior to Admission medications   Medication Sig Start Date End Date Taking? Authorizing Provider  acetaminophen (TYLENOL) 500 MG tablet Take 500 mg by mouth every 8 (eight) hours as needed for mild pain or headache.   Yes [provider]  amLODipine (NORVASC) 2.5 MG tablet Take 1 tablet by mouth daily. 12/04/17  Yes [provider]  aspirin EC 81 MG tablet Take 81 mg by mouth daily.   Yes [provider]  atorvastatin (LIPITOR) 80 MG tablet TAKE 1 TABLET BY MOUTH ONCE DAILY Patient taking differently: daily at 6 PM.  08/19/17  Yes Fisher, Kirstie Peri, MD  Blood Glucose Monitoring Suppl (ONE TOUCH ULTRA 2) w/Device KIT Use to check gluocose daily for type 2 diabetes 12/23/17  Yes Birdie Sons, MD  clopidogrel (PLAVIX) 75 MG tablet TAKE 1 TABLET BY MOUTH ONCE DAILY 01/09/18  Yes Dew, Erskine Squibb, MD  furosemide (LASIX) 40 MG tablet Take 40 mg by mouth 2 (two) times daily. 03/06/18  Yes [provider]  glucose blood test strip Contour  Ascensia test strips and lancets Use to check blood sugar daily for type 2 diabetes, E11.9. 12/22/17  Yes Fisher, Kirstie Peri, MD  insulin degludec (TRESIBA FLEXTOUCH) 100 UNIT/ML SOPN FlexTouch Pen Inject 0.08 mLs (8 Units total) into the skin daily. Increase by 2 units a day if fasting glucose is over 200 Patient taking differently: Inject 6 Units into the skin daily. Increase by 2 units a day if fasting glucose is over 200 02/26/18  Yes Fisher, Kirstie Peri, MD  isosorbide mononitrate (IMDUR) 60 MG 24 hr tablet TAKE 1 TABLET BY MOUTH ONCE DAILY. 01/08/18  Yes Birdie Sons, MD  metoprolol succinate (TOPROL-XL) 50 MG 24 hr tablet TAKE 1 TABLET BY MOUTH ONCE DAILY WITH OR IMMEDIATELY FOLLOWING A MEAL. 01/08/18  Yes Birdie Sons, MD  pantoprazole (PROTONIX) 40 MG tablet Take 1 tablet (40 mg total) by mouth daily. Patient taking differently: Take 40 mg by mouth 2 (two) times daily.  06/03/16  Yes Birdie Sons, MD  sodium bicarbonate 650 MG tablet Take 650 mg by mouth 2 (two) times daily.   Yes [provider]  tamsulosin (FLOMAX) 0.4 MG CAPS capsule TAKE 1 CAPSULE BY MOUTH ONCE DAILY AFTERSUPPER 01/16/18  Yes Birdie Sons, MD  torsemide (DEMADEX) 20 MG tablet Take 2 tablets (40 mg total) by mouth 2 (two) times daily. 02/25/18  Yes Salary, Avel Peace, MD  traMADol (ULTRAM) 50 MG tablet Take 1 tablet (50 mg total) by mouth every 6 (six) hours as needed. 12/07/17  Yes Dew, Erskine Squibb, MD  colchicine 0.6 MG tablet Take 1 tablet (0.6 mg total) by mouth daily as needed (gout). Patient not taking: Reported on 02/26/2018 09/12/17   Birdie Sons, MD  glipiZIDE (GLUCOTROL XL) 10 MG 24 hr tablet TAKE 1 TABLET BY MOUTH ONCE DAILY WITH BREAKFAST Patient not taking: Reported on 03/09/2018 01/09/18   Birdie Sons, MD    Allergies Ciprofloxacin; Trulicity [dulaglutide]; and Hydralazine  Family History  Problem Relation Age of Onset  . Alzheimer's disease Mother   . Alzheimer's disease Brother   .  Lung cancer Paternal Grandfather     Social History Social History   Tobacco Use  . Smoking status: Former Smoker    Packs/day: 1.00    Years: 51.00    Pack years: 51.00    Types: Cigarettes    Last attempt to quit: 06/21/2017    Years since quitting: 0.7  . Smokeless tobacco: Never Used  . Tobacco comment: started smoking at age 57-25. Smoked for 50 years  Substance Use Topics  . Alcohol use: Yes    Alcohol/week: 0.0 standard drinks    Frequency: Never    Comment: 1 every 6 months  . Drug use: No    Review of Systems  Constitutional: No fever/chills Eyes: No visual changes. ENT: No sore throat. Cardiovascular: Positive for chest pain. Respiratory: Positive for shortness of breath. Gastrointestinal: No abdominal pain.  No nausea, no vomiting.  No diarrhea.  No constipation. Genitourinary: Negative for dysuria. Musculoskeletal: Positive for BLE swelling.  Negative for back pain. Skin: Negative for rash. Neurological: Negative for headaches, focal weakness or numbness.   ____________________________________________   PHYSICAL EXAM:  VITAL SIGNS: ED Triage Vitals  Enc Vitals Group     BP 03/08/18 2333 (!) 142/65     Pulse Rate 03/08/18 2333 95     Resp 03/08/18 2333 (!) 24     Temp 03/08/18 2333 (!) 97.5 F (36.4 C)     Temp Source 03/08/18 2333 Oral     SpO2 03/08/18 2333 98 %     Weight 03/08/18 2334 166 lb (75.3 kg)     Height 03/08/18 2334 5' 6"  (1.676 m)     Head Circumference --      Peak Flow --      Pain Score 03/08/18 2334 7     Pain Loc --      Pain Edu? --      Excl. in [place]? --     Constitutional: Alert and oriented.  Chronically ill appearing and in mild to moderate acute distress. Eyes: Conjunctivae are normal. PERRL. EOMI. Head: Atraumatic. Nose: No congestion/rhinnorhea. Mouth/Throat: Mucous membranes are moist.  Oropharynx non-erythematous. Neck: No stridor.   Cardiovascular: Normal rate, regular rhythm. Grossly normal heart sounds.  Good  peripheral circulation. Respiratory: Increased respiratory effort.  No retractions. Lungs with diffuse rales. Gastrointestinal: Generalized edema.  Soft and nontender. No distention. No abdominal bruits. No CVA tenderness. Musculoskeletal: No lower extremity tenderness.  2+ BLE pitting edema.  No joint effusions. Neurologic:  Normal speech and language. No gross focal neurologic  deficits are appreciated.  Skin:  Skin is warm, dry and intact. No rash noted. Psychiatric: Mood and affect are normal. Speech and behavior are normal.  ____________________________________________   LABS (all labs ordered are listed, but only abnormal results are displayed)  Labs Reviewed  BASIC METABOLIC PANEL - Abnormal; Notable for the following components:      Result Value   Glucose, Bld 224 (*)    BUN 66 (*)    Creatinine, Ser 4.91 (*)    Calcium 8.2 (*)    GFR calc non Af Amer 11 (*)    GFR calc Af Amer 12 (*)    All other components within normal limits  CBC - Abnormal; Notable for the following components:   RBC 3.22 (*)    Hemoglobin 9.0 (*)    HCT 28.7 (*)    All other components within normal limits  TROPONIN I - Abnormal; Notable for the following components:   Troponin I 0.08 (*)    All other components within normal limits  BRAIN NATRIURETIC PEPTIDE - Abnormal; Notable for the following components:   B Natriuretic Peptide 2,611.0 (*)    All other components within normal limits   ____________________________________________  EKG  ED ECG REPORT I, SUNG,JADE J, the attending physician, personally viewed and interpreted this ECG.   Date: 03/09/2018  EKG Time: 0006  Rate: 89  Rhythm: normal EKG, normal sinus rhythm  Axis: Normal  Intervals:left posterior fascicular block  ST&T Change: Nonspecific  ____________________________________________  RADIOLOGY  ED MD interpretation: Pulmonary edema  Official radiology report(s): Dg Chest 2 View  Result Date: 03/08/2018 CLINICAL  DATA:  72 y/o M; 1 day of shortness of breath and chest pain. EXAM: CHEST - 2 VIEW COMPARISON:  02/24/2018 chest radiograph. FINDINGS: Stable cardiomegaly given projection and technique. 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 Aortic atherosclerosis with calcification. Reticular and peripheral linear opacities of the lungs. No focal consolidation. Blunted posterior costal diaphragmatic angles. No acute osseous abnormality is evident. IMPRESSION: 1. Interstitial pulmonary edema and possible trace effusions. 2. Stable cardiomegaly given projection and technique. Electronically Signed   By: Kristine Garbe M.D.   On: 03/08/2018 23:55    ____________________________________________   PROCEDURES  Procedure(s) performed: None  Procedures  Critical Care performed: Yes, see critical care note(s)  CRITICAL CARE Performed by: Paulette Blanch   Total critical care time: 45 minutes  Critical care time was exclusive of separately billable procedures and treating other patients.  Critical care was necessary to treat or prevent imminent or life-threatening deterioration.  Critical care was time spent personally by me on the following activities: development of treatment plan with patient and/or surrogate as well as nursing, discussions with consultants, evaluation of patient's response to treatment, examination of patient, obtaining history from patient or surrogate, ordering and performing treatments and interventions, ordering and review of laboratory studies, ordering and review of radiographic studies, pulse oximetry and re-evaluation of patient's condition. ____________________________________________   INITIAL IMPRESSION / ASSESSMENT AND PLAN / ED COURSE  As part of my medical decision making, I reviewed the following data within the [place] History obtained from family, Nursing notes reviewed and incorporated, Labs reviewed, EKG interpreted, Old chart  reviewed, Radiograph reviewed, Discussed with admitting physician and Notes from prior ED visits   72 year old male with CHF who presents with exacerbation associated with chest tightness. Differential includes, but is not limited to, viral syndrome, bronchitis including COPD exacerbation, pneumonia, reactive airway disease including asthma, CHF  including exacerbation with or without pulmonary/interstitial edema, pneumothorax, ACS, thoracic trauma, and pulmonary embolism.  Since patient is not on torsemide, will administer 1 mg Bumex.  Patient is tachypneic but not hypoxic on sitting.  Will ambulate patient with pulse oximeter.  Anticipate hospitalization.   Clinical Course as of Mar 09 124  Fri Mar 09, 2018  0121 Patient ambulated a short distance; room air saturation dropped to 94%. Discussed with hospitalist to evaluate patient in the emergency department for admission.   [JS]    Clinical Course User Index [JS] Paulette Blanch, MD     ____________________________________________   FINAL CLINICAL IMPRESSION(S) / ED DIAGNOSES  Final diagnoses:  Shortness of breath  Acute on chronic congestive heart failure, unspecified heart failure type ([place])  Elevated troponin     ED Discharge Orders    None       Note:  This document was prepared using Dragon voice recognition software and may include unintentional dictation errors.    Paulette Blanch, MD 03/09/18 [phone]

## 2018-03-09 NOTE — ED Notes (Signed)
Pt states that he has had increasing SHOB when he goes to lie down as well increasing abdominal distention in the last couple of days.

## 2018-03-09 NOTE — ED Notes (Signed)
Pt ambulated around ED with pulse ox on finger. Pt o2 sats dropped to 94% and maintained there for the entirety while walking. When getting back into bed, pt had SHOB and some audible wheezing.

## 2018-03-09 NOTE — Plan of Care (Signed)
?  Problem: Elimination: ?Goal: Will not experience complications related to urinary retention ?Outcome: Progressing ?  ?

## 2018-03-09 NOTE — [place] Notes (Signed)
*  PRELIMINARY RESULTS* Echocardiogram 2D Echocardiogram has been performed.  [name] 03/09/2018, 2:25 PM

## 2018-03-09 NOTE — [facility] Notes (Signed)
Attempted to cannulate left AVF x2 without success.  I have notified Dr Juleen China I do not believe that the fistula has matured enough to be cannulated and should be allowed to mature more before another attempt is made.  Will return patient to his room and seek placement of a dialysis catheter tomorrow.

## 2018-03-09 NOTE — Consult Note (Signed)
[facility] Consultation Note  Patient ID: Thomas [name], MRN: [id number], DOB/AGE: 01/02/1946 72 y.o. Admit date: 03/08/2018   Date of Consult: 03/09/2018 Primary Physician: Birdie Sons, MD Primary Cardiologist: Raynelle Chary  Chief Complaint:  Chief Complaint  Patient presents with  . Shortness of Breath   Reason for Consult: Heart failure  HPI: 72 y.o. male with known coronary artery disease status post coronary bypass graft in the remote past with previous stent 1 year ago and mild myocardial infarction with chronic systolic dysfunction congestive heart failure and ejection fraction of 35% with peripheral vascular disease and significant claudication status post stenting in his lower extremities with stage V chronic kidney disease with a glomerular filtration rate of 11 having appropriate medication management for cardiovascular disease and cardiomyopathy including metoprolol isosorbide hydralazine amlodipine for hypertension control and cardiomyopathy as well as Plavix aspirin for peripheral vascular disease and coronary disease status post PCI and stent placement and high intensity cholesterol therapy.  The patient has had multiple episodes of admissions recently for fluid retention and acute on chronic systolic dysfunction congestive heart failure most consistent with exacerbation of kidney dysfunction and mild changes in diet rather than true new cardiovascular issues.  This is a very similar admission with shortness of breath pulmonary edema and lower extremity edema with shortness of breath.  The patient does not have any chest pain or other symptoms consistent with a new progression of cardiovascular event  Past Medical History:  Diagnosis Date  . Allergy   . Arthritis   . CAD (coronary artery disease)   . Chronic kidney disease   . Diabetes mellitus without complication ([place])   . Dyspnea   . Erosive esophagitis   . GERD (gastroesophageal reflux disease)   .  Gouty arthropathy   . History of colonic polyps   . History of kidney stones   . History of MRSA infection   . Hyperkalemia   . Hyperlipidemia   . Hypertension   . Melanoma ([place])   . Myocardial infarction ([place]) 1986  . Peripheral vascular disease ([place])   . 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 Trigger finger of left hand   . Ureteral tumor       Surgical History:  Past Surgical History:  Procedure Laterality Date  . AV FISTULA PLACEMENT Left 12/07/2017   Procedure: ARTERIOVENOUS (AV) FISTULA CREATION ( BRACHIOBASILIC STAGE );  Surgeon: Algernon Huxley, MD;  Location: [facility];  Service: Vascular;  Laterality: Left;  . CATARACT EXTRACTION    . CORONARY ARTERY BYPASS GRAFT  1996  . CORONARY STENT INTERVENTION N/A 05/29/2017   Procedure: CORONARY STENT INTERVENTION;  Surgeon: Isaias Cowman, MD;  Location: [place] [facility];  Service: Cardiovascular;  Laterality: N/A;  . EXCISION Ureteral Tumor     (benign) Dr. Quillian Quince  . EYE SURGERY Bilateral    Cataract Extraction with IOL  . LEFT HEART CATH AND CORONARY ANGIOGRAPHY N/A 10/18/2016   Procedure: Left Heart Cath and Coronary Angiography;  Surgeon: Isaias Cowman, MD;  Location: [place] [facility];  Service: Cardiovascular;  Laterality: N/A;  . LEFT HEART CATH AND CORONARY ANGIOGRAPHY N/A 05/29/2017   Procedure: LEFT HEART CATH AND CORONARY ANGIOGRAPHY;  Surgeon: Isaias Cowman, MD;  Location: [place] [facility];  Service: Cardiovascular;  Laterality: N/A;  . LOWER EXTREMITY ANGIOGRAPHY Right 10/31/2016   Procedure: Lower Extremity Angiography;  Surgeon: Algernon Huxley, MD;  Location: [place] [facility];  Service: Cardiovascular;  Laterality: Right;  . LOWER EXTREMITY ANGIOGRAPHY Left 11/14/2016   Procedure: Lower Extremity Angiography;  Surgeon: Algernon Huxley, MD;  Location: [place] [facility];  Service: Cardiovascular;  Laterality: Left;  . LOWER EXTREMITY INTERVENTION  11/14/2016   Procedure:  Lower Extremity Intervention;  Surgeon: Algernon Huxley, MD;  Location: [place] [facility];  Service: Cardiovascular;;  . RETINAL DETACHMENT SURGERY Right November 2107     Home Meds: Prior to Admission medications   Medication Sig Start Date End Date Taking? Authorizing Provider  acetaminophen (TYLENOL) 500 MG tablet Take 500 mg by mouth every 8 (eight) hours as needed for mild pain or headache.   Yes [provider]  amLODipine (NORVASC) 2.5 MG tablet Take 1 tablet by mouth daily. 12/04/17  Yes [provider]  aspirin EC 81 MG tablet Take 81 mg by mouth daily.   Yes [provider]  atorvastatin (LIPITOR) 80 MG tablet TAKE 1 TABLET BY MOUTH ONCE DAILY Patient taking differently: daily at 6 PM.  08/19/17  Yes Fisher, Kirstie Peri, MD  Blood Glucose Monitoring Suppl (ONE TOUCH ULTRA 2) w/Device KIT Use to check gluocose daily for type 2 diabetes 12/23/17  Yes Birdie Sons, MD  clopidogrel (PLAVIX) 75 MG tablet TAKE 1 TABLET BY MOUTH ONCE DAILY 01/09/18  Yes Dew, Erskine Squibb, MD  furosemide (LASIX) 40 MG tablet Take 40 mg by mouth 2 (two) times daily. 03/06/18  Yes [provider]  glucose blood test strip Contour Ascensia test strips and lancets Use to check blood sugar daily for type 2 diabetes, E11.9. 12/22/17  Yes Fisher, Kirstie Peri, MD  insulin degludec (TRESIBA FLEXTOUCH) 100 UNIT/ML SOPN FlexTouch Pen Inject 0.08 mLs (8 Units total) into the skin daily. Increase by 2 units a day if fasting glucose is over 200 Patient taking differently: Inject 6 Units into the skin daily. Increase by 2 units a day if fasting glucose is over 200 02/26/18  Yes Fisher, Kirstie Peri, MD  isosorbide mononitrate (IMDUR) 60 MG 24 hr tablet TAKE 1 TABLET BY MOUTH ONCE DAILY. 01/08/18  Yes Birdie Sons, MD  metoprolol succinate (TOPROL-XL) 50 MG 24 hr tablet TAKE 1 TABLET BY MOUTH ONCE DAILY WITH OR IMMEDIATELY FOLLOWING A MEAL. 01/08/18  Yes Birdie Sons, MD  pantoprazole (PROTONIX) 40 MG  tablet Take 1 tablet (40 mg total) by mouth daily. Patient taking differently: Take 40 mg by mouth 2 (two) times daily.  06/03/16  Yes Birdie Sons, MD  sodium bicarbonate 650 MG tablet Take 650 mg by mouth 2 (two) times daily.   Yes [provider]  tamsulosin (FLOMAX) 0.4 MG CAPS capsule TAKE 1 CAPSULE BY MOUTH ONCE DAILY AFTERSUPPER 01/16/18  Yes Birdie Sons, MD  torsemide (DEMADEX) 20 MG tablet Take 2 tablets (40 mg total) by mouth 2 (two) times daily. 02/25/18  Yes Salary, Avel Peace, MD  traMADol (ULTRAM) 50 MG tablet Take 1 tablet (50 mg total) by mouth every 6 (six) hours as needed. 12/07/17  Yes Dew, Erskine Squibb, MD  colchicine 0.6 MG tablet Take 1 tablet (0.6 mg total) by mouth daily as needed (gout). Patient not taking: Reported on 02/26/2018 09/12/17   Birdie Sons, MD  glipiZIDE (GLUCOTROL XL) 10 MG 24 hr tablet TAKE 1 TABLET BY MOUTH ONCE DAILY WITH BREAKFAST Patient not taking: Reported on 03/09/2018 01/09/18   Birdie Sons, MD    Inpatient Medications:  . amLODipine  2.5 mg Oral  Daily  . aspirin  81 mg Oral Daily  . atorvastatin  80 mg Oral q1800  . bumetanide (BUMEX) IV  2 mg Intravenous BID  . calcium acetate  667 mg Oral TID WC  . clopidogrel  75 mg Oral Daily  . heparin  5,000 Units Subcutaneous Q8H  . insulin aspart  0-15 Units Subcutaneous TID WC  . insulin aspart  0-5 Units Subcutaneous QHS  . insulin aspart  2 Units Subcutaneous TID WC  . insulin glargine  4 Units Subcutaneous Daily  . isosorbide mononitrate  60 mg Oral Daily  . metoprolol succinate  50 mg Oral Daily  . pantoprazole  40 mg Oral Daily  . sodium bicarbonate  650 mg Oral BID  . sodium chloride flush  3 mL Intravenous Q12H  . tamsulosin  0.4 mg Oral QPC supper  . [START ON 03/10/2018] torsemide  40 mg Oral BID   . sodium chloride      Allergies:  Allergies  Allergen Reactions  . Ciprofloxacin Itching and Swelling    Facial swelling   . Trulicity [Dulaglutide]     Bloating   . Hydralazine Nausea And Vomiting    Social History   Socioeconomic History  . Marital status: Married    Spouse name: Not on file  . Number of children: 2  . Years of education: Not on file  . Highest education level: Some college, no degree  Occupational History  . Occupation: Retired  Scientific laboratory technician  . Financial resource strain: Not hard at all  . Food insecurity:    Worry: Never true    Inability: Never true  . Transportation needs:    Medical: No    Non-medical: No  Tobacco Use  . Smoking status: Former Smoker    Packs/day: 1.00    Years: 51.00    Pack years: 51.00    Types: Cigarettes    Last attempt to quit: 06/21/2017    Years since quitting: 0.7  . Smokeless tobacco: Never Used  . Tobacco comment: started smoking at age [age]. Smoked for 50 years  Substance and Sexual Activity  . Alcohol use: Yes    Alcohol/week: 0.0 standard drinks    Frequency: Never    Comment: 1 every 6 months  . Drug use: No  . Sexual activity: Not on file  Lifestyle  . Physical activity:    Days per week: Not on file    Minutes per session: Not on file  . Stress: Not at all  Relationships  . Social connections:    Talks on phone: Not on file    Gets together: Not on file    Attends religious service: Not on file    Active member of club or organization: Not on file    Attends meetings of clubs or organizations: Not on file    Relationship status: Not on file  . Intimate partner violence:    Fear of current or ex partner: Not on file    Emotionally abused: Not on file    Physically abused: Not on file    Forced sexual activity: Not on file  Other Topics Concern  . Not on file  Social History Narrative  . Not on file     Family History  Problem Relation Age of Onset  . Alzheimer's disease Mother   . Alzheimer's disease Brother   . Lung cancer Paternal Grandfather      Review of Systems Positive for redness of breath cough congestion Negative  for: General:  chills,  fever, night sweats or weight changes.  Cardiovascular: PND orthopnea syncope dizziness  Dermatological skin lesions rashes Respiratory: As it of for cough congestion Urologic: Frequent urination urination at night and hematuria Abdominal: negative for nausea, vomiting, diarrhea, bright red blood per rectum, melena, or hematemesis Neurologic: negative for visual changes, and/or hearing changes  All other systems reviewed and are otherwise negative except as noted above.  Labs: Recent Labs    03/09/18 0012 03/09/18 0243 03/09/18 0605  TROPONINI 0.08* 0.08* 0.09*   Lab Results  Component Value Date   WBC 8.3 03/09/2018   HGB 9.0 (L) 03/09/2018   HCT 28.7 (L) 03/09/2018   MCV 89.1 03/09/2018   PLT 365 03/09/2018    Recent Labs  Lab 03/09/18 0012 03/09/18 0243  NA 135  --   K 4.6  --   CL 104  --   CO2 24  --   BUN 66*  --   CREATININE 4.91*  --   CALCIUM 8.2*  --   PROT  --  6.2*  BILITOT  --  0.4  ALKPHOS  --  98  ALT  --  15  AST  --  16  GLUCOSE 224*  --    Lab Results  Component Value Date   CHOL 140 06/30/2017   HDL 47 06/30/2017   LDLCALC 70 06/30/2017   TRIG 115 06/30/2017   No results found for: DDIMER  Radiology/Studies:  Dg Chest 2 View  Result Date: 03/08/2018 CLINICAL DATA:  72 y/o M; 1 day of shortness of breath and chest pain. EXAM: CHEST - 2 VIEW COMPARISON:  02/24/2018 chest radiograph. FINDINGS: Stable cardiomegaly given projection and technique. 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 Aortic atherosclerosis with calcification. Reticular and peripheral linear opacities of the lungs. No focal consolidation. Blunted posterior costal diaphragmatic angles. No acute osseous abnormality is evident. IMPRESSION: 1. Interstitial pulmonary edema and possible trace effusions. 2. Stable cardiomegaly given projection and technique. Electronically Signed   By: Kristine Garbe M.D.   On: 03/08/2018 23:55   Dg Chest 2 View  Result Date:  02/24/2018 CLINICAL DATA:  Acute onset of shortness of breath. EXAM: CHEST - 2 VIEW COMPARISON:  Chest radiograph performed 02/21/2018 FINDINGS: The lungs are well-aerated. Vascular congestion is noted. Mild peribronchial thickening is noted. There is no evidence of pleural effusion or pneumothorax. The heart is borderline enlarged. No acute osseous abnormalities are seen. IMPRESSION: Vascular congestion and borderline cardiomegaly. Mild peribronchial thickening noted. Electronically Signed   By: Garald Balding M.D.   On: 02/24/2018 02:38   Dg Chest 2 View  Result Date: 02/21/2018 CLINICAL DATA:  Shortness of breath. EXAM: CHEST - 2 VIEW COMPARISON:  02/19/2018. 05/24/2017.  CT 06/14/2016. FINDINGS: Prior CABG. Cardiomegaly with mild pulmonary vascular prominence and bilateral interstitial prominence again noted. Mild CHF could present this fashion. Pneumonitis cannot be completely excluded. No acute bony abnormality identified. IMPRESSION: Prior CABG. Cardiomegaly with mild pulmonary vascular prominence and bilateral interstitial prominence again noted. Findings suggest mild CHF. Electronically Signed   By: Marcello Moores  Register   On: 02/21/2018 14:18   Dg Chest 2 View  Result Date: 02/19/2018 CLINICAL DATA:  Difficulty breathing for the past 4 5 days, orthopnea, and cough. History of asthma, previous MI, former smoker. EXAM: CHEST - 2 VIEW COMPARISON:  PA and lateral chest x-ray of July 16, 2017 FINDINGS: The lungs are mildly hyperinflated with hemidiaphragm flattening. The interstitial markings are mildly increased especially  in the lower lobes. There is no alveolar infiltrate. The cardiac silhouette is enlarged. The pulmonary vascularity is not engorged. There are post CABG changes. There is no pleural effusion. There is calcification in the wall of the aortic arch. The bony thorax exhibits no acute abnormality. IMPRESSION: Chronic bronchitic changes. No alveolar pneumonia. One cannot exclude low-grade  compensated CHF. Electronically Signed   By: David  Martinique M.D.   On: 02/19/2018 15:28   Dg Abd 2 Views  Result Date: 02/19/2018 CLINICAL DATA:  Abdominal bloating and distension and tenderness over the mid upper abdomen since beginning a new diabetes medication 8 weeks ago. EXAM: ABDOMEN - 2 VIEW COMPARISON:  Abdominal and pelvic CT scan of July 10, 2017 FINDINGS: The bowel gas pattern is within the limits of normal. The stool burden is moderate. There is no small or large bowel obstructive pattern. There are no abnormal soft tissue calcifications. There surgical clips overlying the right aspect of the a sacrum and at the base of the right lung medially. The bony structures exhibit no acute abnormalities. IMPRESSION: No evidence of bowel obstruction, ileus, perforation, or constipation. No definite acute intra-abdominal abnormality. Electronically Signed   By: David  Martinique M.D.   On: 02/19/2018 15:31    EKG: Normal sinus rhythm with first-degree AV block and left intraventricular conduction defect  Weights: Filed Weights   03/08/18 2334  Weight: 75.3 kg     Physical Exam: Blood pressure 129/74, pulse 94, temperature (!) 97.4 F (36.3 C), temperature source Oral, resp. rate (!) 24, height _0  (1.676 m), weight 75.3 kg, SpO2 99 %. Body mass index is 26.79 kg/m. General: Well developed, well nourished, in no acute distress. Head eyes ears nose throat: Normocephalic, atraumatic, sclera non-icteric, no xanthomas, nares are without discharge. No apparent thyromegaly and/or mass  Lungs: Normal respiratory effort.  no wheezes, basilar rales, no rhonchi.  Heart: RRR with normal S1 S2. no murmur gallop, no rub, PMI is normal size and placement, carotid upstroke normal without bruit, jugular venous pressure is normal Abdomen: Soft, non-tender, non-distended with normoactive bowel sounds. No hepatomegaly. No rebound/guarding. No obvious abdominal masses. Abdominal aorta is normal size without  bruit Extremities: 1+ edema. no cyanosis, no clubbing, no ulcers  Peripheral : 2+ bilateral upper extremity pulses, 2+ bilateral femoral pulses, 2+ bilateral dorsal pedal pulse Neuro: Alert and oriented. No facial asymmetry. No focal deficit. Moves all extremities spontaneously. Musculoskeletal: Normal muscle tone without kyphosis Psych:  Responds to questions appropriately with a normal affect.    Assessment: 72 year old male with acute on chronic systolic dysfunction congestive heart failure exacerbated by chronic kidney disease stage V with stable ejection fraction of 35% peripheral vascular disease and no current evidence of myocardial infarction  Plan: 1.  Continue treatment of edema and exacerbation of congestive heart failure with diuretics as nephrology and would further consider the possibility of dialysis if necessary 2.  No further cardiac intervention and/or diagnostics necessary at this time 3.  Continue aggressive medication management for cardiomyopathy and chronic systolic dysfunction heart failure including hydralazine isosorbide and metoprolol 4.  High intensity cholesterol therapy 5.  Begin ambulation and if further improvements of symptoms after above  Signed, Corey Skains M.D. [facility] 03/09/2018, 7:39 AM

## 2018-03-09 NOTE — ED Notes (Signed)
Admitting MD at bedside.

## 2018-03-09 NOTE — H&P (Signed)
[facility] NAME: Thomas [name]    MR#:  [id number]  DATE OF BIRTH:  07-Dec-1945  DATE OF ADMISSION:  03/08/2018  PRIMARY CARE PHYSICIAN: Birdie Sons, MD   REQUESTING/REFERRING PHYSICIAN: Paulette Blanch, MD  CHIEF COMPLAINT:   Chief Complaint  Patient presents with  . Shortness of Breath    HISTORY OF PRESENT ILLNESS:  Thomas [name]  is a 72 y.o. male with a known history of T2IDDM (w/ nephropathy), HTN, HLD, CAD/MI (s/p CABG 1996; severe multivessel disease as of 05/29/2017 cardiac cath), chronic systolic CHF (EF 88-41% as of 05/25/2017 Echo), CKD IV (s/p LUE AVF; not yet started on dialysis) p/w SOB/volume overload, acute on chronic systolic CHF exacerbation, acute on chronic/worsening renal failure (AKI on CKD IV). Pt was recently admitted to [facility] from 11/09-11/10 for volume overload/acute systolic CHF exacerbation. He is now readmitted under similar circumstances.  Pt AAOx3, good historian. He tells me he has been feeling unwell since he left the hospital, (+) fatigue/malaise, generalized weakness, decreased exercise tolerance. Endorses insomnia, sleep x1-2hrs per night. (+) orthopnea whilst lying flat, (+) abdominal distension impeding respiration whilst sitting upright. He endorses decreased exercise tolerance, and states he feels chronically uncomfortable, restless and anxious. His daughter states his volume status was much improved at discharge (11/10), and his family has been checking daily weights at home. Pt has been adherent to medications and diet. They state that he was weight-stable at/near 164lb up until Thursday (03/08/2018). He weighed 164lb in the morning, but his weight had increased to 166-167lb by the evening. Pt endorses worsening abdominal distension, worsening leg edema, and worsening SOB/decreased exercise tolerance. He states his abdominal distension has been steadily increasing over the past 8-9wks. He  endorses decreased urine output since hospital discharge, but he states that it has further decreased in the past 2-3d.  States he sees Dr. Saralyn Pilar (Cardiology) and Dr. Candiss Norse (Nephrology) as outpt. He is tachypneic and has some mild conversational dyspnea at the time of my assessment, but is not in acute distress whilst at rest. He denies chest pain.  PAST MEDICAL HISTORY:   Past Medical History:  Diagnosis Date  . Allergy   . Arthritis   . CAD (coronary artery disease)   . Chronic kidney disease   . Diabetes mellitus without complication ([place])   . Dyspnea   . Erosive esophagitis   . GERD (gastroesophageal reflux disease)   . Gouty arthropathy   . History of colonic polyps   . History of kidney stones   . History of MRSA infection   . Hyperkalemia   . Hyperlipidemia   . Hypertension   . Melanoma ([place])   . Myocardial infarction ([place]) 1986  . Peripheral vascular disease ([place])   . 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 Trigger finger of left hand   . Ureteral tumor     PAST SURGICAL HISTORY:   Past Surgical History:  Procedure Laterality Date  . AV FISTULA PLACEMENT Left 12/07/2017   Procedure: ARTERIOVENOUS (AV) FISTULA CREATION ( BRACHIOBASILIC STAGE );  Surgeon: Algernon Huxley, MD;  Location: [facility] ORS;  Service: Vascular;  Laterality: Left;  . CATARACT EXTRACTION    . CORONARY ARTERY BYPASS GRAFT  1996  . CORONARY STENT INTERVENTION N/A 05/29/2017   Procedure: CORONARY STENT INTERVENTION;  Surgeon: Isaias Cowman, MD;  Location: [place] [facility];  Service: Cardiovascular;  Laterality: N/A;  . EXCISION  Ureteral Tumor     (benign) Dr. Quillian Quince  . EYE SURGERY Bilateral    Cataract Extraction with IOL  . LEFT HEART CATH AND CORONARY ANGIOGRAPHY N/A 10/18/2016   Procedure: Left Heart Cath and Coronary Angiography;  Surgeon: Isaias Cowman, MD;  Location: [place] [facility];  Service: Cardiovascular;  Laterality: N/A;  . LEFT HEART CATH AND  CORONARY ANGIOGRAPHY N/A 05/29/2017   Procedure: LEFT HEART CATH AND CORONARY ANGIOGRAPHY;  Surgeon: Isaias Cowman, MD;  Location: [place] [facility];  Service: Cardiovascular;  Laterality: N/A;  . LOWER EXTREMITY ANGIOGRAPHY Right 10/31/2016   Procedure: Lower Extremity Angiography;  Surgeon: Algernon Huxley, MD;  Location: [place] [facility];  Service: Cardiovascular;  Laterality: Right;  . LOWER EXTREMITY ANGIOGRAPHY Left 11/14/2016   Procedure: Lower Extremity Angiography;  Surgeon: Algernon Huxley, MD;  Location: [place] [facility];  Service: Cardiovascular;  Laterality: Left;  . LOWER EXTREMITY INTERVENTION  11/14/2016   Procedure: Lower Extremity Intervention;  Surgeon: Algernon Huxley, MD;  Location: [place] [facility];  Service: Cardiovascular;;  . RETINAL DETACHMENT SURGERY Right November 2107    SOCIAL HISTORY:   Social History   Tobacco Use  . Smoking status: Former Smoker    Packs/day: 1.00    Years: 51.00    Pack years: 51.00    Types: Cigarettes    Last attempt to quit: 06/21/2017    Years since quitting: 0.7  . Smokeless tobacco: Never Used  . Tobacco comment: started smoking at age [age]. Smoked for 50 years  Substance Use Topics  . Alcohol use: Yes    Alcohol/week: 0.0 standard drinks    Frequency: Never    Comment: 1 every 6 months    FAMILY HISTORY:   Family History  Problem Relation Age of Onset  . Alzheimer's disease Mother   . Alzheimer's disease Brother   . Lung cancer Paternal Grandfather     DRUG ALLERGIES:   Allergies  Allergen Reactions  . Ciprofloxacin Itching and Swelling    Facial swelling   . Trulicity [Dulaglutide]     Bloating  . Hydralazine Nausea And Vomiting    REVIEW OF SYSTEMS:   Review of Systems  Constitutional: Positive for malaise/fatigue. Negative for chills, diaphoresis, fever and weight loss.  HENT: Negative for congestion, ear pain, hearing loss, nosebleeds, sinus pain, sore throat and tinnitus.   Eyes: Negative  for blurred vision, double vision and photophobia.  Respiratory: Positive for shortness of breath. Negative for cough, hemoptysis, sputum production and wheezing.   Cardiovascular: Positive for orthopnea and leg swelling. Negative for chest pain, palpitations, claudication and PND.  Gastrointestinal: Negative for abdominal pain, blood in stool, constipation, diarrhea, heartburn, melena, nausea and vomiting.  Genitourinary: Negative for dysuria, frequency, hematuria and urgency.  Musculoskeletal: Negative for back pain, joint pain, myalgias and neck pain.  Skin: Negative for itching and rash.  Neurological: Positive for weakness. Negative for dizziness, tingling, tremors, sensory change, speech change, focal weakness, seizures, loss of consciousness and headaches.  Psychiatric/Behavioral: Negative for memory loss. The patient is nervous/anxious and has insomnia.    MEDICATIONS AT HOME:   Prior to Admission medications   Medication Sig Start Date End Date Taking? Authorizing Provider  acetaminophen (TYLENOL) 500 MG tablet Take 500 mg by mouth every 8 (eight) hours as needed for mild pain or headache.   Yes [provider]  amLODipine (NORVASC) 2.5 MG tablet Take 1 tablet by mouth daily. 12/04/17  Yes [provider]  aspirin EC 81 MG tablet Take 81 mg by mouth daily.   Yes [provider]  atorvastatin (LIPITOR) 80 MG tablet TAKE 1 TABLET BY MOUTH ONCE DAILY Patient taking differently: daily at 6 PM.  08/19/17  Yes Fisher, Kirstie Peri, MD  Blood Glucose Monitoring Suppl (ONE TOUCH ULTRA 2) w/Device KIT Use to check gluocose daily for type 2 diabetes 12/23/17  Yes Birdie Sons, MD  clopidogrel (PLAVIX) 75 MG tablet TAKE 1 TABLET BY MOUTH ONCE DAILY 01/09/18  Yes Dew, Erskine Squibb, MD  furosemide (LASIX) 40 MG tablet Take 40 mg by mouth 2 (two) times daily. 03/06/18  Yes [provider]  glucose blood test strip Contour Ascensia test strips and lancets Use to check blood  sugar daily for type 2 diabetes, E11.9. 12/22/17  Yes Fisher, Kirstie Peri, MD  insulin degludec (TRESIBA FLEXTOUCH) 100 UNIT/ML SOPN FlexTouch Pen Inject 0.08 mLs (8 Units total) into the skin daily. Increase by 2 units a day if fasting glucose is over 200 Patient taking differently: Inject 6 Units into the skin daily. Increase by 2 units a day if fasting glucose is over 200 02/26/18  Yes Fisher, Kirstie Peri, MD  isosorbide mononitrate (IMDUR) 60 MG 24 hr tablet TAKE 1 TABLET BY MOUTH ONCE DAILY. 01/08/18  Yes Birdie Sons, MD  metoprolol succinate (TOPROL-XL) 50 MG 24 hr tablet TAKE 1 TABLET BY MOUTH ONCE DAILY WITH OR IMMEDIATELY FOLLOWING A MEAL. 01/08/18  Yes Birdie Sons, MD  pantoprazole (PROTONIX) 40 MG tablet Take 1 tablet (40 mg total) by mouth daily. Patient taking differently: Take 40 mg by mouth 2 (two) times daily.  06/03/16  Yes Birdie Sons, MD  sodium bicarbonate 650 MG tablet Take 650 mg by mouth 2 (two) times daily.   Yes [provider]  tamsulosin (FLOMAX) 0.4 MG CAPS capsule TAKE 1 CAPSULE BY MOUTH ONCE DAILY AFTERSUPPER 01/16/18  Yes Birdie Sons, MD  torsemide (DEMADEX) 20 MG tablet Take 2 tablets (40 mg total) by mouth 2 (two) times daily. 02/25/18  Yes Salary, Avel Peace, MD  traMADol (ULTRAM) 50 MG tablet Take 1 tablet (50 mg total) by mouth every 6 (six) hours as needed. 12/07/17  Yes Dew, Erskine Squibb, MD  colchicine 0.6 MG tablet Take 1 tablet (0.6 mg total) by mouth daily as needed (gout). Patient not taking: Reported on 02/26/2018 09/12/17   Birdie Sons, MD  glipiZIDE (GLUCOTROL XL) 10 MG 24 hr tablet TAKE 1 TABLET BY MOUTH ONCE DAILY WITH BREAKFAST Patient not taking: Reported on 03/09/2018 01/09/18   Birdie Sons, MD      VITAL SIGNS:  Blood pressure 129/89, pulse 90, temperature (!) 97.5 F (36.4 C), temperature source Oral, resp. rate 18, height _0  (1.676 m), weight 75.3 kg, SpO2 97 %.  PHYSICAL EXAMINATION:  Physical Exam  Constitutional: He  is oriented to person, place, and time. He appears well-developed and well-nourished. He is active and cooperative.  Non-toxic appearance. He has a sickly appearance. He appears ill. No distress. He is not intubated. Nasal cannula in place.  HENT:  Head: Normocephalic and atraumatic.  Mouth/Throat: Oropharynx is clear and moist. No oropharyngeal exudate or posterior oropharyngeal edema.  Eyes: Conjunctivae, EOM and lids are normal. No scleral icterus.  Neck: Neck supple. No JVD present. No thyromegaly present.  Cardiovascular: Normal rate, regular rhythm, S1 normal and S2 normal. Exam reveals no gallop, no S3, no S4, no distant heart sounds and no friction rub.  Murmur heard.  Systolic murmur is present with a grade of 2/6. Pulmonary/Chest: No accessory muscle usage or stridor. Tachypnea noted. No apnea and no bradypnea. He is not intubated. No respiratory distress. He has decreased breath sounds in the right upper field, the right middle field, the right lower field, the left upper field, the left middle field and the left lower field. He has no wheezes. He has no rhonchi. He has rales in the right lower field and the left lower field.  Abdominal: Soft. He exhibits distension and ascites. Bowel sounds are decreased. There is no tenderness. There is no rigidity, no rebound and no guarding.  Musculoskeletal: Normal range of motion. He exhibits edema (3-4+ B/L LE edema.). He exhibits no tenderness.       Right lower leg: Normal. He exhibits edema (3-4+ B/L LE edema.). He exhibits no tenderness.       Left lower leg: Normal. He exhibits edema (3-4+ B/L LE edema.). He exhibits no tenderness.  Lymphadenopathy:    He has no cervical adenopathy.  Neurological: He is alert and oriented to person, place, and time. He is not disoriented.  Skin: Skin is warm and dry. No rash noted. He is not diaphoretic. No erythema. No pallor.  Psychiatric: He has a normal mood and affect. His speech is normal and behavior  is normal. Judgment and thought content normal. His mood appears not anxious. He is not agitated. Cognition and memory are normal.   Bibasilar fine crackles. (+) 3-4+ B/L LE edema. (-) JVD. (+) LUE AVF. LABORATORY PANEL:   CBC Recent Labs  Lab 03/09/18 0012  WBC 8.3  HGB 9.0*  HCT 28.7*  PLT 365   ------------------------------------------------------------------------------------------------------------------  Chemistries  Recent Labs  Lab 03/09/18 0012  NA 135  K 4.6  CL 104  CO2 24  GLUCOSE 224*  BUN 66*  CREATININE 4.91*  CALCIUM 8.2*   ------------------------------------------------------------------------------------------------------------------  Cardiac Enzymes Recent Labs  Lab 03/09/18 0012  TROPONINI 0.08*   ------------------------------------------------------------------------------------------------------------------  RADIOLOGY:  Dg Chest 2 View  Result Date: 03/08/2018 CLINICAL DATA:  72 y/o M; 1 day of shortness of breath and chest pain. EXAM: CHEST - 2 VIEW COMPARISON:  02/24/2018 chest radiograph. FINDINGS: Stable cardiomegaly given projection and technique. 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 Aortic atherosclerosis with calcification. Reticular and peripheral linear opacities of the lungs. No focal consolidation. Blunted posterior costal diaphragmatic angles. No acute osseous abnormality is evident. IMPRESSION: 1. Interstitial pulmonary edema and possible trace effusions. 2. Stable cardiomegaly given projection and technique. Electronically Signed   By: Kristine Garbe M.D.   On: 03/08/2018 23:55   IMPRESSION AND PLAN:   A/P: 44M w/ Hx chronic systolic CHF (EF 88-50% as of 05/25/2017 Echo), CKD IV (s/p LUE AVF; not yet started on dialysis) p/w SOB/volume overload, acute on chronic systolic CHF exacerbation, acute on chronic/worsening renal failure (AKI on CKD IV). Ascites. Hyperglycemia (w/ T2IDDM), normocytic anemia. -SOB,  volume overload, acute on chronic systolic CHF exacerbation, acute on chronic renal failure (AKI on CKD IV): SOB, orthopnea, volume overload. BNP 2611. CXR (+) pulmonary edema + effusions. Suspect worsening renal failure has precipitated acute on chronic heart failure exacerbation (Cardiorenal Syndrome Type IV progressing to Cardiorenal Syndrome Type III). 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 I do not have any suspicion that his presentation was precipitated by medication and/or dietary non-adherence. He endorses decreased urine output. He is prepared to go on dialysis, and already has a  LUE AVF. I will trial Bumex, but I expect diuresis to have limited efficacy; if this indeed becomes the case, the situation would portend appropriateness for initiation of dialysis. Trop-I 0.08, Hx elevation (0.03-0.10 on previous admissions). Rpt/trend. Tele, continuous cardiac monitoring, pulse ox. Strict I&O, daily weight, free water restricted diet. C/w ASA, Plavix, statin, beta blocker. Not on ACE-I/ARB (CKD). Echo pending. Renal U/S pending.  -Ascites: Trial diuresis. If no improvement, may need dialysis. If no improvement w/ dialysis, may need paracentesis. U/S pending. -Hyperglycemia, T2IDDM: SSI, long-acting insulin. -Normocytic anemia: Likely anemia of chronic (kidney) disease. No evidence of active/acute blood loss at present time. -c/w other home meds/formulary subs. -FEN/GI: Renal diabetic free water restricted diet. -DVT PPx: Heparin. -Code status: Full code. -Disposition: Admission, > 2 midnights.   All the records are reviewed and case discussed with ED provider. Management plans discussed with the patient, family and they are in agreement.  CODE STATUS: Full code.  TOTAL TIME TAKING CARE OF THIS PATIENT: 75 minutes.    Arta Silence M.D on 03/09/2018 at 2:10 AM  Between 7am to 6pm - Pager - [phone]  After 6pm go to [email or web address] - Proofreader  [facility]  Office  [phone]  CC: Primary care physician; Birdie Sons, MD   Note: This dictation was prepared with Dragon dictation along with smaller phrase technology. Any transcriptional errors that result from this process are unintentional.

## 2018-03-09 NOTE — Progress Notes (Signed)
[facility]  ROUNDING NOTE   Subjective:   [name] admitted to [facility] on 03/08/2018 for Shortness of breath [R06.02] Elevated troponin [R79.89] AKI (acute kidney injury) ([place]) [N17.9] Ascites [R18.8] Acute on chronic congestive heart failure, unspecified heart failure type ([place]) [I50.9]  Wife at bedside.   Objective:  Vital signs in last 24 hours:  Temp:  [97.4 F (36.3 C)-97.5 F (36.4 C)] 97.4 F (36.3 C) (11/22 0742) Pulse Rate:  [86-95] 86 (11/22 1017) Resp:  [18-24] 24 (11/22 0230) BP: (129-143)/(65-89) 143/75 (11/22 0742) SpO2:  [97 %-100 %] 100 % (11/22 0742) Weight:  [75.3 kg] 75.3 kg (11/21 2334)  Weight change:  Filed Weights   03/08/18 2334  Weight: 75.3 kg    Intake/Output: I/O last 3 completed shifts: In: 3 [I.V.:3] Out: 375 [Urine:375]   Intake/Output this shift:  Total I/O In: -  Out: 300 [Urine:300]  Physical Exam: General: NAD,   Head: Normocephalic, atraumatic. Moist oral mucosal membranes  Eyes: Anicteric, PERRL  Neck: Supple, trachea midline  Lungs:  Clear to auscultation  Heart: Regular rate and rhythm  Abdomen:  Soft, nontender,   Extremities:  1+ peripheral edema.  Neurologic: Nonfocal, moving all four extremities  Skin: No lesions  Access: AVF left    Basic Metabolic Panel: Recent Labs  Lab 03/09/18 0012 03/09/18 0243  NA 135  --   K 4.6  --   CL 104  --   CO2 24  --   GLUCOSE 224*  --   BUN 66*  --   CREATININE 4.91*  --   CALCIUM 8.2*  --   MG  --  2.2  PHOS  --  6.0*    Liver Function Tests: Recent Labs  Lab 03/09/18 0243  AST 16  ALT 15  ALKPHOS 98  BILITOT 0.4  PROT 6.2*  ALBUMIN 2.9*   No results for input(s): LIPASE, AMYLASE in the last 168 hours. No results for input(s): AMMONIA in the last 168 hours.  CBC: Recent Labs  Lab 03/09/18 0012  WBC 8.3  HGB 9.0*  HCT 28.7*  MCV 89.1  PLT 365    Cardiac Enzymes: Recent Labs  Lab 03/09/18 0012 03/09/18 0243  03/09/18 0605  TROPONINI 0.08* 0.08* 0.09*    BNP: Invalid input(s): POCBNP  CBG: Recent Labs  Lab 03/09/18 0740 03/09/18 1157  GLUCAP 134* 69    Microbiology: Results for orders placed or performed in visit on 07/03/17  Urine Culture     Status: None   Collection Time: 07/03/17 12:14 PM  Result Value Ref Range Status   Urine Culture, Routine Final report  Final   Organism ID, Bacteria Comment  Final    Comment: Mixed urogenital flora Less than 10,000 colonies/mL     Coagulation Studies: No results for input(s): LABPROT, INR in the last 72 hours.  Urinalysis: No results for input(s): COLORURINE, LABSPEC, PHURINE, GLUCOSEU, HGBUR, BILIRUBINUR, KETONESUR, PROTEINUR, UROBILINOGEN, NITRITE, LEUKOCYTESUR in the last 72 hours.  Invalid input(s): APPERANCEUR    Imaging: Dg Chest 2 View  Result Date: 03/08/2018 CLINICAL DATA:  72 y/o M; 1 day of shortness of breath and chest pain. EXAM: CHEST - 2 VIEW COMPARISON:  02/24/2018 chest radiograph. FINDINGS: Stable cardiomegaly given projection and technique. 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 Aortic atherosclerosis with calcification. Reticular and peripheral linear opacities of the lungs. No focal consolidation. Blunted posterior costal diaphragmatic angles. No acute osseous abnormality is evident. IMPRESSION: 1. Interstitial pulmonary  edema and possible trace effusions. 2. Stable cardiomegaly given projection and technique. Electronically Signed   By: Kristine Garbe M.D.   On: 03/08/2018 23:55   US Renal  Result Date: 03/09/2018 CLINICAL DATA:  Acute kidney injury EXAM: RENAL / URINARY TRACT ULTRASOUND COMPLETE COMPARISON:  CT 07/10/2017.  Ultrasound 05/27/2017 FINDINGS: Right Kidney: Renal measurements: 9.1 x 4.7 x 5.3 cm = volume: 118 mL. Small cyst in the upper pole measures up to 1 cm. Cortical thinning and increased echotexture. No hydronephrosis. Left Kidney: Renal measurements: 10.4 x 5.6 x 6.1 cm  = volume: 184 mL. 2.3 cm cyst in the upper pole. Cortical thinning and increased echotexture diffusely throughout the left kidney. No hydronephrosis. Bladder: Appears normal for degree of bladder distention. IMPRESSION: Increased echotexture and cortical thinning within the kidneys bilaterally compatible with chronic medical renal disease. No hydronephrosis. No acute findings or change since prior ultrasound. Electronically Signed   By: Rolm Baptise M.D.   On: 03/09/2018 09:39   [place] Ascites (abdomen Limited)  Result Date: 03/09/2018 CLINICAL DATA:  Abdominal distension EXAM: LIMITED ABDOMEN ULTRASOUND FOR ASCITES TECHNIQUE: Limited ultrasound survey for ascites was performed in all four abdominal quadrants. COMPARISON:  None. FINDINGS: Four quadrant survey in the abdomen and pelvis demonstrates no visible ascites. IMPRESSION: No ascites seen. Electronically Signed   By: Rolm Baptise M.D.   On: 03/09/2018 09:37     Medications:   . sodium chloride     . amLODipine  2.5 mg Oral Daily  . aspirin  81 mg Oral Daily  . atorvastatin  80 mg Oral q1800  . bumetanide (BUMEX) IV  2 mg Intravenous BID  . calcium acetate  667 mg Oral TID WC  . clopidogrel  75 mg Oral Daily  . heparin  5,000 Units Subcutaneous Q8H  . insulin aspart  0-15 Units Subcutaneous TID WC  . insulin aspart  0-5 Units Subcutaneous QHS  . insulin aspart  2 Units Subcutaneous TID WC  . insulin glargine  4 Units Subcutaneous Daily  . isosorbide mononitrate  60 mg Oral Daily  . metoprolol succinate  50 mg Oral Daily  . pantoprazole  40 mg Oral Daily  . sodium bicarbonate  650 mg Oral BID  . sodium chloride flush  3 mL Intravenous Q12H  . tamsulosin  0.4 mg Oral QPC supper  . [START ON 03/10/2018] torsemide  40 mg Oral BID   sodium chloride, acetaminophen, bisacodyl, morphine injection, nitroGLYCERIN, ondansetron (ZOFRAN) IV, senna-docusate, sodium chloride flush  Assessment/ Plan:  [name] is a 72 y.o. white male  with diabetes, hypertension, severe coronary disease, CABG 1996, peripheral arterial disease, congestive heart failure.   1. Chronic kidney disease, stage V: progression to end stage renal disease. Will initiate hemodialysis today. Orders prepared. 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 Acute exacerbation of chronic systolic congestive heart failure: has failed outpatient diuretics.  - Appreciate cards input - ultrafiltration with dialysis.  - will need to start ARB before discharge.   3. Diabetes mellitus type II with chronic kidney disease: history of poor control  4. Anemia of chronic kidney disease: hemoglobin 9. Discussed role of ESA   LOS: 0 Tawney Vanorman 11/22/20191:34 PM

## 2018-03-10 ENCOUNTER — Inpatient Hospital Stay: Payer: Medicare Other

## 2018-03-10 DIAGNOSIS — Z992 Dependence on renal dialysis: Secondary | ICD-10-CM

## 2018-03-10 DIAGNOSIS — T82858A Stenosis of vascular prosthetic devices, implants and grafts, initial encounter: Secondary | ICD-10-CM

## 2018-03-10 DIAGNOSIS — N185 Chronic kidney disease, stage 5: Secondary | ICD-10-CM

## 2018-03-10 LAB — ECHOCARDIOGRAM COMPLETE
Height: 66 in
WEIGHTICAEL: 2656 [oz_av]

## 2018-03-10 LAB — BASIC METABOLIC PANEL
ANION GAP: 12 (ref 5–15)
BUN: 68 mg/dL — ABNORMAL HIGH (ref 8–23)
CALCIUM: 8.2 mg/dL — AB (ref 8.9–10.3)
CO2: 22 mmol/L (ref 22–32)
Chloride: 106 mmol/L (ref 98–111)
Creatinine, Ser: 4.71 mg/dL — ABNORMAL HIGH (ref 0.61–1.24)
GFR, EST AFRICAN AMERICAN: 13 mL/min — AB (ref >60)
GFR, EST NON AFRICAN AMERICAN: 11 mL/min — AB (ref >60)
GLUCOSE: 122 mg/dL — AB (ref 70–99)
Potassium: 4.4 mmol/L (ref 3.5–5.1)
SODIUM: 140 mmol/L (ref 135–145)

## 2018-03-10 LAB — GLUCOSE, CAPILLARY
GLUCOSE-CAPILLARY: 156 mg/dL — AB (ref 70–99)
GLUCOSE-CAPILLARY: 185 mg/dL — AB (ref 70–99)
Glucose-Capillary: 124 mg/dL — ABNORMAL HIGH (ref 70–99)

## 2018-03-10 LAB — CBC
HCT: 27.8 % — ABNORMAL LOW (ref 39.0–52.0)
Hemoglobin: 8.7 g/dL — ABNORMAL LOW (ref 13.0–17.0)
MCH: 28.2 pg (ref 26.0–34.0)
MCHC: 31.3 g/dL (ref 30.0–36.0)
MCV: 90 fL (ref 80.0–100.0)
PLATELETS: 324 10*3/uL (ref 150–400)
RBC: 3.09 MIL/uL — AB (ref 4.22–5.81)
RDW: 14.6 % (ref 11.5–15.5)
WBC: 7.4 10*3/uL (ref 4.0–10.5)
nRBC: 0 % (ref 0.0–0.2)

## 2018-03-10 LAB — CALCIUM, IONIZED: Calcium, Ionized, Serum: 4.8 mg/dL (ref 4.5–5.6)

## 2018-03-10 LAB — VITAMIN D 25 HYDROXY (VIT D DEFICIENCY, FRACTURES): VIT D 25 HYDROXY: 12.3 ng/mL — AB (ref 30.0–100.0)

## 2018-03-10 LAB — UREA NITROGEN, URINE: Urea Nitrogen, Ur: 257 mg/dL

## 2018-03-10 LAB — PARATHYROID HORMONE, INTACT (NO CA): PTH: 162 pg/mL — AB (ref 15–65)

## 2018-03-10 MED ORDER — SODIUM CHLORIDE 0.9% FLUSH
10.0000 mL | Freq: Two times a day (BID) | INTRAVENOUS | Status: DC
  Administered 2018-03-10 – 2018-03-12 (×4): 10 mL

## 2018-03-10 NOTE — Progress Notes (Signed)
[facility]  ROUNDING NOTE   Subjective:   Unable to get hemodialysis yesterday as patient's AVF was not mature.   Vascular surgery has placed temp HD catheter.   Objective:  Vital signs in last 24 hours:  Temp:  [97.5 F (36.4 C)-98.3 F (36.8 C)] 97.5 F (36.4 C) (11/23 0856) Pulse Rate:  [89-102] 102 (11/23 0856) Resp:  [16] 16 (11/23 0453) BP: (125-151)/(72-82) 138/82 (11/23 0856) SpO2:  [95 %-97 %] 96 % (11/23 0856) Weight:  [77.5 kg] 77.5 kg (11/23 0231)  Weight change: 2.223 kg Filed Weights   03/08/18 2334 03/10/18 0231  Weight: 75.3 kg 77.5 kg    Intake/Output: I/O last 3 completed shifts: In: 3 [I.V.:3] Out: 1500 [Urine:1500]   Intake/Output this shift:  Total I/O In: -  Out: 325 [Urine:325]  Physical Exam: General: NAD,   Head: Normocephalic, atraumatic. Moist oral mucosal membranes  Eyes: Anicteric, PERRL  Neck: Supple, trachea midline  Lungs:  crackles  Heart: Regular rate and rhythm  Abdomen:  Soft, nontender,   Extremities:  1+ peripheral edema.  Neurologic: Nonfocal, moving all four extremities  Skin: No lesions  Access: AVF left    Basic Metabolic Panel: Recent Labs  Lab 03/09/18 0012 03/09/18 0243 03/10/18 0659  NA 135  --  140  K 4.6  --  4.4  CL 104  --  106  CO2 24  --  22  GLUCOSE 224*  --  122*  BUN 66*  --  68*  CREATININE 4.91*  --  4.71*  CALCIUM 8.2*  --  8.2*  MG  --  2.2  --   PHOS  --  6.0*  --     Liver Function Tests: Recent Labs  Lab 03/09/18 0243  AST 16  ALT 15  ALKPHOS 98  BILITOT 0.4  PROT 6.2*  ALBUMIN 2.9*   No results for input(s): LIPASE, AMYLASE in the last 168 hours. No results for input(s): AMMONIA in the last 168 hours.  CBC: Recent Labs  Lab 03/09/18 0012 03/10/18 0659  WBC 8.3 7.4  HGB 9.0* 8.7*  HCT 28.7* 27.8*  MCV 89.1 90.0  PLT 365 324    Cardiac Enzymes: Recent Labs  Lab 03/09/18 0012 03/09/18 0243 03/09/18 0605  TROPONINI 0.08* 0.08* 0.09*     BNP: Invalid input(s): POCBNP  CBG: Recent Labs  Lab 03/09/18 1157 03/09/18 1629 03/09/18 2049 03/10/18 0858 03/10/18 1218  GLUCAP 94 145* 146* 124* 156*    Microbiology: Results for orders placed or performed in visit on 07/03/17  Urine Culture     Status: None   Collection Time: 07/03/17 12:14 PM  Result Value Ref Range Status   Urine Culture, Routine Final report  Final   Organism ID, Bacteria Comment  Final    Comment: Mixed urogenital flora Less than 10,000 colonies/mL     Coagulation Studies: No results for input(s): LABPROT, INR in the last 72 hours.  Urinalysis: No results for input(s): COLORURINE, LABSPEC, PHURINE, GLUCOSEU, HGBUR, BILIRUBINUR, KETONESUR, PROTEINUR, UROBILINOGEN, NITRITE, LEUKOCYTESUR in the last 72 hours.  Invalid input(s): APPERANCEUR    Imaging: Ct Abdomen Pelvis Wo Contrast  Result Date: 03/09/2018 CLINICAL DATA:  Abdominal distension. EXAM: CT ABDOMEN AND PELVIS WITHOUT CONTRAST TECHNIQUE: Multidetector CT imaging of the abdomen and pelvis was performed following the standard protocol without IV contrast. COMPARISON:  CT scan of July 10, 2017. FINDINGS: Lower chest: Mild right pleural effusion is noted. Hepatobiliary: No focal liver abnormality is seen. No gallstones,  gallbladder wall thickening, or biliary dilatation. Pancreas: Unremarkable. No pancreatic ductal dilatation or surrounding inflammatory changes. Spleen: Normal in size without focal abnormality. Adrenals/Urinary Tract: Adrenal glands are unremarkable. Stable left renal cyst is noted. No hydronephrosis or renal obstruction is noted. No renal or ureteral calculi are noted. Urinary bladder is unremarkable. Stomach/Bowel: Stomach is within normal limits. Appendix appears normal. No evidence of bowel wall thickening, distention, or inflammatory changes. Vascular/Lymphatic: Aortic atherosclerosis. No enlarged abdominal or pelvic lymph nodes. Reproductive: Stable mild prostatic  enlargement is noted. Other: Multiple fat containing ventral hernias are noted in the periumbilical and supraumbilical regions. Musculoskeletal: No acute or significant osseous findings. IMPRESSION: Mild right pleural effusion. Multiple fat containing ventral hernias are noted in the periumbilical and supraumbilical regions. Stable mild prostatic enlargement. No acute abnormality seen in the abdomen or pelvis. Aortic Atherosclerosis (ICD10-I70.0). Electronically Signed   By: Marijo Conception, M.D.   On: 03/09/2018 13:58   Dg Chest 2 View  Result Date: 03/08/2018 CLINICAL DATA:  72 y/o M; 1 day of shortness of breath and chest pain. EXAM: CHEST - 2 VIEW COMPARISON:  02/24/2018 chest radiograph. FINDINGS: Stable cardiomegaly given projection and technique. 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 Aortic atherosclerosis with calcification. Reticular and peripheral linear opacities of the lungs. No focal consolidation. Blunted posterior costal diaphragmatic angles. No acute osseous abnormality is evident. IMPRESSION: 1. Interstitial pulmonary edema and possible trace effusions. 2. Stable cardiomegaly given projection and technique. Electronically Signed   By: Kristine Garbe M.D.   On: 03/08/2018 23:55   US Renal  Result Date: 03/09/2018 CLINICAL DATA:  Acute kidney injury EXAM: RENAL / URINARY TRACT ULTRASOUND COMPLETE COMPARISON:  CT 07/10/2017.  Ultrasound 05/27/2017 FINDINGS: Right Kidney: Renal measurements: 9.1 x 4.7 x 5.3 cm = volume: 118 mL. Small cyst in the upper pole measures up to 1 cm. Cortical thinning and increased echotexture. No hydronephrosis. Left Kidney: Renal measurements: 10.4 x 5.6 x 6.1 cm = volume: 184 mL. 2.3 cm cyst in the upper pole. Cortical thinning and increased echotexture diffusely throughout the left kidney. No hydronephrosis. Bladder: Appears normal for degree of bladder distention. IMPRESSION: Increased echotexture and cortical thinning within the kidneys  bilaterally compatible with chronic medical renal disease. No hydronephrosis. No acute findings or change since prior ultrasound. Electronically Signed   By: Rolm Baptise M.D.   On: 03/09/2018 09:39   Dg Chest Port 1 View  Result Date: 03/10/2018 CLINICAL DATA:  Central line placement. EXAM: PORTABLE CHEST 1 VIEW COMPARISON:  03/08/2018 and prior exams FINDINGS: A RIGHT IJ central venous catheter is noted with tip overlying the SUPERIOR cavoatrial junction. Cardiomegaly and CABG changes again noted. Mild interstitial prominence again noted. There is no evidence of pneumothorax or large pleural effusion. IMPRESSION: RIGHT IJ central venous catheter placement with tip overlying the SUPERIOR cavoatrial junction. No evidence of pneumothorax. Cardiomegaly with mild interstitial prominence again noted. Electronically Signed   By: Margarette Canada M.D.   On: 03/10/2018 11:29   [place] Ascites (abdomen Limited)  Result Date: 03/09/2018 CLINICAL DATA:  Abdominal distension EXAM: LIMITED ABDOMEN ULTRASOUND FOR ASCITES TECHNIQUE: Limited ultrasound survey for ascites was performed in all four abdominal quadrants. COMPARISON:  None. FINDINGS: Four quadrant survey in the abdomen and pelvis demonstrates no visible ascites. IMPRESSION: No ascites seen. Electronically Signed   By: Rolm Baptise M.D.   On: 03/09/2018 09:37     Medications:   . sodium chloride     . amLODipine  2.5  mg Oral Daily  . atorvastatin  80 mg Oral q1800  . calcium acetate  667 mg Oral TID WC  . clopidogrel  75 mg Oral Daily  . heparin  5,000 Units Subcutaneous Q8H  . insulin aspart  0-15 Units Subcutaneous TID WC  . insulin aspart  0-5 Units Subcutaneous QHS  . insulin aspart  2 Units Subcutaneous TID WC  . insulin glargine  4 Units Subcutaneous Daily  . isosorbide mononitrate  60 mg Oral Daily  . metoprolol succinate  50 mg Oral Daily  . pantoprazole  40 mg Oral Daily  . sodium bicarbonate  650 mg Oral BID  . sodium chloride flush  3 mL  Intravenous Q12H  . tamsulosin  0.4 mg Oral QPC supper  . torsemide  40 mg Oral BID  . traZODone  100 mg Oral QHS   sodium chloride, acetaminophen, bisacodyl, morphine injection, nitroGLYCERIN, ondansetron (ZOFRAN) IV, senna-docusate, sodium chloride flush  Assessment/ Plan:  [name] is a 72 y.o. white male with diabetes, hypertension, severe coronary disease, CABG 1996, peripheral arterial disease, congestive heart failure.   1. End stage renal disease. Will initiate hemodialysis today. Orders prepared. Temp HD catheter placed.  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  - Outpatient planning for [facility]  2. Acute exacerbation of chronic systolic congestive heart failure:   - ultrafiltration with dialysis.  - will need to start ARB before discharge.   3. Diabetes mellitus type II with chronic kidney disease: history of poor control. Hemoglobin A1c 9.3% on 01/24/18.   4. Anemia of chronic kidney disease: hemoglobin 8.7 - EPO with HD treatment   LOS: 1 Sally-Ann Cutbirth 11/23/20191:00 PM

## 2018-03-10 NOTE — Progress Notes (Signed)
[facility] Encounter Note  Patient: Thomas [name] / Admit Date: 03/08/2018 / Date of Encounter: 03/10/2018, 5:33 AM   Subjective: The patient has had significant improvements in his shortness of breath pulmonary edema and acute on chronic systolic dysfunction congestive heart failure likely secondary to chronic kidney disease stage V.  The patient has had an attempt for dialysis but this is not worked very well due to his shunt not working well.  There is no evidence of myocardial infarction with a peak troponin of 0.09 more consistent with demand ischemia rather than acute coronary syndrome  Review of Systems: Positive for: Redness of breath Negative for: Vision change, hearing change, syncope, dizziness, nausea, vomiting,diarrhea, bloody stool, stomach pain, cough, congestion, diaphoresis, urinary frequency, urinary pain,skin lesions, skin rashes Others previously listed  Objective: Telemetry: Normal sinus rhythm Physical Exam: Blood pressure 125/75, pulse 89, temperature 98.2 F (36.8 C), resp. rate 16, height 5\' 6"  (1.676 m), weight 77.5 kg, SpO2 97 %. Body mass index is 27.58 kg/m. General: Well developed, well nourished, in no acute distress. Head: Normocephalic, atraumatic, sclera non-icteric, no xanthomas, nares are without discharge. Neck: No apparent masses Lungs: Normal respirations with no wheezes, no rhonchi, no rales , few basilar crackles   Heart: Regular rate and rhythm, normal S1 S2, no murmur, no rub, no gallop, PMI is normal size and placement, carotid upstroke normal without bruit, jugular venous pressure normal Abdomen: Soft, non-tender, non-distended with normoactive bowel sounds. No hepatosplenomegaly. Abdominal aorta is normal size without bruit Extremities: Trace edema, no clubbing, no cyanosis, no ulcers,  Peripheral: 2+ radial, 2+ femoral, 2+ dorsal pedal pulses Neuro: Alert and oriented. Moves all extremities spontaneously. Psych:   Responds to questions appropriately with a normal affect.   Intake/Output Summary (Last 24 hours) at 03/10/2018 0533 Last data filed at 03/10/2018 0231 Gross per 24 hour  Intake -  Output 1275 ml  Net -1275 ml    Inpatient Medications:  . amLODipine  2.5 mg Oral Daily  . atorvastatin  80 mg Oral q1800  . calcium acetate  667 mg Oral TID WC  . clopidogrel  75 mg Oral Daily  . heparin  5,000 Units Subcutaneous Q8H  . insulin aspart  0-15 Units Subcutaneous TID WC  . insulin aspart  0-5 Units Subcutaneous QHS  . insulin aspart  2 Units Subcutaneous TID WC  . insulin glargine  4 Units Subcutaneous Daily  . isosorbide mononitrate  60 mg Oral Daily  . metoprolol succinate  50 mg Oral Daily  . pantoprazole  40 mg Oral Daily  . sodium bicarbonate  650 mg Oral BID  . sodium chloride flush  3 mL Intravenous Q12H  . tamsulosin  0.4 mg Oral QPC supper  . torsemide  40 mg Oral BID  . traZODone  100 mg Oral QHS   Infusions:  . sodium chloride      Labs: Recent Labs    03/09/18 0012 03/09/18 0243  NA 135  --   K 4.6  --   CL 104  --   CO2 24  --   GLUCOSE 224*  --   BUN 66*  --   CREATININE 4.91*  --   CALCIUM 8.2*  --   MG  --  2.2  PHOS  --  6.0*   Recent Labs    03/09/18 0243  AST 16  ALT 15  ALKPHOS 98  BILITOT 0.4  PROT 6.2*  ALBUMIN 2.9*   Recent Labs  03/09/18 0012  WBC 8.3  HGB 9.0*  HCT 28.7*  MCV 89.1  PLT 365   Recent Labs    03/09/18 0012 03/09/18 0243 03/09/18 0605  TROPONINI 0.08* 0.08* 0.09*   Invalid input(s): POCBNP No results for input(s): HGBA1C in the last 72 hours.   Weights: Filed Weights   03/08/18 2334 03/10/18 0231  Weight: 75.3 kg 77.5 kg     Radiology/Studies:  Ct Abdomen Pelvis Wo Contrast  Result Date: 03/09/2018 CLINICAL DATA:  Abdominal distension. EXAM: CT ABDOMEN AND PELVIS WITHOUT CONTRAST TECHNIQUE: Multidetector CT imaging of the abdomen and pelvis was performed following the standard protocol without IV  contrast. COMPARISON:  CT scan of July 10, 2017. FINDINGS: Lower chest: Mild right pleural effusion is noted. Hepatobiliary: No focal liver abnormality is seen. No gallstones, gallbladder wall thickening, or biliary dilatation. Pancreas: Unremarkable. No pancreatic ductal dilatation or surrounding inflammatory changes. Spleen: Normal in size without focal abnormality. Adrenals/Urinary Tract: Adrenal glands are unremarkable. Stable left renal cyst is noted. No hydronephrosis or renal obstruction is noted. No renal or ureteral calculi are noted. Urinary bladder is unremarkable. Stomach/Bowel: Stomach is within normal limits. Appendix appears normal. No evidence of bowel wall thickening, distention, or inflammatory changes. Vascular/Lymphatic: Aortic atherosclerosis. No enlarged abdominal or pelvic lymph nodes. Reproductive: Stable mild prostatic enlargement is noted. Other: Multiple fat containing ventral hernias are noted in the periumbilical and supraumbilical regions. Musculoskeletal: No acute or significant osseous findings. IMPRESSION: Mild right pleural effusion. Multiple fat containing ventral hernias are noted in the periumbilical and supraumbilical regions. Stable mild prostatic enlargement. No acute abnormality seen in the abdomen or pelvis. Aortic Atherosclerosis (ICD10-I70.0). Electronically Signed   By: Marijo Conception, M.D.   On: 03/09/2018 13:58   Dg Chest 2 View  Result Date: 03/08/2018 CLINICAL DATA:  72 y/o M; 1 day of shortness of breath and chest pain. EXAM: CHEST - 2 VIEW COMPARISON:  02/24/2018 chest radiograph. FINDINGS: Stable cardiomegaly given projection and technique. 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 Aortic atherosclerosis with calcification. Reticular and peripheral linear opacities of the lungs. No focal consolidation. Blunted posterior costal diaphragmatic angles. No acute osseous abnormality is evident. IMPRESSION: 1. Interstitial pulmonary edema and possible  trace effusions. 2. Stable cardiomegaly given projection and technique. Electronically Signed   By: Kristine Garbe M.D.   On: 03/08/2018 23:55   Dg Chest 2 View  Result Date: 02/24/2018 CLINICAL DATA:  Acute onset of shortness of breath. EXAM: CHEST - 2 VIEW COMPARISON:  Chest radiograph performed 02/21/2018 FINDINGS: The lungs are well-aerated. Vascular congestion is noted. Mild peribronchial thickening is noted. There is no evidence of pleural effusion or pneumothorax. The heart is borderline enlarged. No acute osseous abnormalities are seen. IMPRESSION: Vascular congestion and borderline cardiomegaly. Mild peribronchial thickening noted. Electronically Signed   By: Garald Balding M.D.   On: 02/24/2018 02:38   Dg Chest 2 View  Result Date: 02/21/2018 CLINICAL DATA:  Shortness of breath. EXAM: CHEST - 2 VIEW COMPARISON:  02/19/2018. 05/24/2017.  CT 06/14/2016. FINDINGS: Prior CABG. Cardiomegaly with mild pulmonary vascular prominence and bilateral interstitial prominence again noted. Mild CHF could present this fashion. Pneumonitis cannot be completely excluded. No acute bony abnormality identified. IMPRESSION: Prior CABG. Cardiomegaly with mild pulmonary vascular prominence and bilateral interstitial prominence again noted. Findings suggest mild CHF. Electronically Signed   By: Marcello Moores  Register   On: 02/21/2018 14:18   Dg Chest 2 View  Result Date: 02/19/2018 CLINICAL DATA:  Difficulty breathing  for the past 4 5 days, orthopnea, and cough. History of asthma, previous MI, former smoker. EXAM: CHEST - 2 VIEW COMPARISON:  PA and lateral chest x-ray of July 16, 2017 FINDINGS: The lungs are mildly hyperinflated with hemidiaphragm flattening. The interstitial markings are mildly increased especially in the lower lobes. There is no alveolar infiltrate. The cardiac silhouette is enlarged. The pulmonary vascularity is not engorged. There are post CABG changes. There is no pleural effusion. There is  calcification in the wall of the aortic arch. The bony thorax exhibits no acute abnormality. IMPRESSION: Chronic bronchitic changes. No alveolar pneumonia. One cannot exclude low-grade compensated CHF. Electronically Signed   By: David  Martinique M.D.   On: 02/19/2018 15:28   US Renal  Result Date: 03/09/2018 CLINICAL DATA:  Acute kidney injury EXAM: RENAL / URINARY TRACT ULTRASOUND COMPLETE COMPARISON:  CT 07/10/2017.  Ultrasound 05/27/2017 FINDINGS: Right Kidney: Renal measurements: 9.1 x 4.7 x 5.3 cm = volume: 118 mL. Small cyst in the upper pole measures up to 1 cm. Cortical thinning and increased echotexture. No hydronephrosis. Left Kidney: Renal measurements: 10.4 x 5.6 x 6.1 cm = volume: 184 mL. 2.3 cm cyst in the upper pole. Cortical thinning and increased echotexture diffusely throughout the left kidney. No hydronephrosis. Bladder: Appears normal for degree of bladder distention. IMPRESSION: Increased echotexture and cortical thinning within the kidneys bilaterally compatible with chronic medical renal disease. No hydronephrosis. No acute findings or change since prior ultrasound. Electronically Signed   By: Rolm Baptise M.D.   On: 03/09/2018 09:39   Dg Abd 2 Views  Result Date: 02/19/2018 CLINICAL DATA:  Abdominal bloating and distension and tenderness over the mid upper abdomen since beginning a new diabetes medication 8 weeks ago. EXAM: ABDOMEN - 2 VIEW COMPARISON:  Abdominal and pelvic CT scan of July 10, 2017 FINDINGS: The bowel gas pattern is within the limits of normal. The stool burden is moderate. There is no small or large bowel obstructive pattern. There are no abnormal soft tissue calcifications. There surgical clips overlying the right aspect of the a sacrum and at the base of the right lung medially. The bony structures exhibit no acute abnormalities. IMPRESSION: No evidence of bowel obstruction, ileus, perforation, or constipation. No definite acute intra-abdominal abnormality.  Electronically Signed   By: David  Martinique M.D.   On: 02/19/2018 15:31   [place] Ascites (abdomen Limited)  Result Date: 03/09/2018 CLINICAL DATA:  Abdominal distension EXAM: LIMITED ABDOMEN ULTRASOUND FOR ASCITES TECHNIQUE: Limited ultrasound survey for ascites was performed in all four abdominal quadrants. COMPARISON:  None. FINDINGS: Four quadrant survey in the abdomen and pelvis demonstrates no visible ascites. IMPRESSION: No ascites seen. Electronically Signed   By: Rolm Baptise M.D.   On: 03/09/2018 09:37     Assessment and Recommendation  72 y.o. male with known coronary artery disease status post coronary bypass graft and previous stenting with peripheral vascular disease and acute on chronic systolic dysfunction congestive heart failure secondary to worsening chronic kidney disease stage V with elevated troponin consistent with demand ischemia rather than acute coronary syndrome 1.  Continue supportive care for stable LV systolic dysfunction including beta-blocker and amlodipine 2.  Intravenous Lasix and or dialysis for acute on chronic stage V disease and pulmonary edema 3.  No further intervention or color further cardiac diagnostics necessary at this time due to no evidence of significant change in cardiovascular status with troponin consistent with demand ischemia 4.  Begin ambulation and follow for improvements of symptoms and  further treatment options after above  Signed, Serafina Royals M.D. FACC

## 2018-03-10 NOTE — Progress Notes (Signed)
This note also relates to the following rows which could not be included: Pulse Rate - Cannot attach notes to unvalidated device data Resp - Cannot attach notes to unvalidated device data BP - Cannot attach notes to unvalidated device data  Hd started  

## 2018-03-10 NOTE — Procedures (Signed)
The patient signed consent. He was then positioned on his bed in Trendelenburg position. The neck was prepped and draped. The skin was infiltrated with lidocaine. The ultrasound was used to visualize the puncture into the vein. A guidewire was placed. The skin was nicked with an 11 blade. The dilators were serially passed over the wire. The non-tunneled triple lumen dialysis catheter was placed over the wire. All ports were flushed and capped after removing the guidewire. The catheter was sutured ion place. A dressing was applied. He tolerated this procedure well.

## 2018-03-10 NOTE — Consult Note (Signed)
Reason for Consult: Needs temporary dialysis access Referring Physician: Dartanion, Teo is an 72 y.o. male.  HPI: 72 year old man with history of chronic kidney disease who was admitted to the hospital for dialysis initiation. His access was created in August 2019 and seemed to be maturing well. He wqs cleared to initiate dialysis through it yesterday. Upon access him the fistula was found to be not matured adequately. He had some bleeding and bruising which prompted stopping dialysis. A call was then placed for vascular assessment and catheter placement.  Past Medical History:  Diagnosis Date  . Allergy   . Arthritis   . CAD (coronary artery disease)   . Chronic kidney disease   . Diabetes mellitus without complication ([place])   . Dyspnea   . Erosive esophagitis   . GERD (gastroesophageal reflux disease)   . Gouty arthropathy   . History of colonic polyps   . History of kidney stones   . History of MRSA infection   . Hyperkalemia   . Hyperlipidemia   . Hypertension   . Melanoma ([place])   . Myocardial infarction ([place]) 1986  . Peripheral vascular disease ([place])   . 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 Trigger finger of left hand   . Ureteral tumor     Past Surgical History:  Procedure Laterality Date  . AV FISTULA PLACEMENT Left 12/07/2017   Procedure: ARTERIOVENOUS (AV) FISTULA CREATION ( BRACHIOBASILIC STAGE );  Surgeon: Algernon Huxley, MD;  Location: [facility];  Service: Vascular;  Laterality: Left;  . CATARACT EXTRACTION    . CORONARY ARTERY BYPASS GRAFT  1996  . CORONARY STENT INTERVENTION N/A 05/29/2017   Procedure: CORONARY STENT INTERVENTION;  Surgeon: Isaias Cowman, MD;  Location: [place] [facility];  Service: Cardiovascular;  Laterality: N/A;  . EXCISION Ureteral Tumor     (benign) Dr. Quillian Quince  . EYE SURGERY Bilateral    Cataract Extraction with IOL  . LEFT HEART CATH AND CORONARY ANGIOGRAPHY N/A 10/18/2016   Procedure: Left Heart  Cath and Coronary Angiography;  Surgeon: Isaias Cowman, MD;  Location: [place] [facility];  Service: Cardiovascular;  Laterality: N/A;  . LEFT HEART CATH AND CORONARY ANGIOGRAPHY N/A 05/29/2017   Procedure: LEFT HEART CATH AND CORONARY ANGIOGRAPHY;  Surgeon: Isaias Cowman, MD;  Location: [place] [facility];  Service: Cardiovascular;  Laterality: N/A;  . LOWER EXTREMITY ANGIOGRAPHY Right 10/31/2016   Procedure: Lower Extremity Angiography;  Surgeon: Algernon Huxley, MD;  Location: [place];  Service: Cardiovascular;  Laterality: Right;  . LOWER EXTREMITY ANGIOGRAPHY Left 11/14/2016   Procedure: Lower Extremity Angiography;  Surgeon: Algernon Huxley, MD;  Location: [facility];  Service: Cardiovascular;  Laterality: Left;  . LOWER EXTREMITY INTERVENTION  11/14/2016   Procedure: Lower Extremity Intervention;  Surgeon: Algernon Huxley, MD;  Location: [facility];  Service: Cardiovascular;;  . RETINAL DETACHMENT SURGERY Right November 2107    Family History  Problem Relation Age of Onset  . Alzheimer's disease Mother   . Alzheimer's disease Brother   . Lung cancer Paternal Grandfather     Social History:  reports that he quit smoking about 8 months ago. His smoking use included cigarettes. He has a 51.00 pack-year smoking history. He has never used smokeless tobacco. He reports that he drinks alcohol. He reports that he does not use drugs.  Allergies:  Allergies  Allergen Reactions  . Ciprofloxacin  Itching and Swelling    Facial swelling   . Trulicity [Dulaglutide]     Bloating  . Hydralazine Nausea And Vomiting    Medications:  Prior to Admission:  Medications Prior to Admission  Medication Sig Dispense Refill Last Dose  . acetaminophen (TYLENOL) 500 MG tablet Take 500 mg by mouth every 8 (eight) hours as needed for mild pain or headache.   prn at prn  . amLODipine (NORVASC) 2.5 MG tablet Take 1 tablet by mouth daily.   Past Week at Unknown time  .  aspirin EC 81 MG tablet Take 81 mg by mouth daily.   Past Week at Unknown time  . atorvastatin (LIPITOR) 80 MG tablet TAKE 1 TABLET BY MOUTH ONCE DAILY (Patient taking differently: daily at 6 PM. ) 30 tablet 5 Past Week at Unknown time  . Blood Glucose Monitoring Suppl (ONE TOUCH ULTRA 2) w/Device KIT Use to check gluocose daily for type 2 diabetes 1 each 0 Past Week at Unknown time  . clopidogrel (PLAVIX) 75 MG tablet TAKE 1 TABLET BY MOUTH ONCE DAILY 30 tablet 2 Past Week at Unknown time  . furosemide (LASIX) 40 MG tablet Take 40 mg by mouth 2 (two) times daily.     Marland Kitchen glucose blood test strip Contour [employer]ia test strips and lancets Use to check blood sugar daily for type 2 diabetes, E11.9. 100 each 4 Past Week at Unknown time  . insulin degludec (TRESIBA FLEXTOUCH) 100 UNIT/ML SOPN FlexTouch Pen Inject 0.08 mLs (8 Units total) into the skin daily. Increase by 2 units a day if fasting glucose is over 200 (Patient taking differently: Inject 6 Units into the skin daily. Increase by 2 units a day if fasting glucose is over 200) 6 mL 0 Past Week at Unknown time  . isosorbide mononitrate (IMDUR) 60 MG 24 hr tablet TAKE 1 TABLET BY MOUTH ONCE DAILY. 30 tablet 5 Past Week at Unknown time  . metoprolol succinate (TOPROL-XL) 50 MG 24 hr tablet TAKE 1 TABLET BY MOUTH ONCE DAILY WITH OR IMMEDIATELY FOLLOWING A MEAL. 30 tablet 5 Past Week at Unknown time  . pantoprazole (PROTONIX) 40 MG tablet Take 1 tablet (40 mg total) by mouth daily. (Patient taking differently: Take 40 mg by mouth 2 (two) times daily. ) 30 tablet 12 Past Week at Unknown time  . sodium bicarbonate 650 MG tablet Take 650 mg by mouth 2 (two) times daily.   Past Week at Unknown time  . tamsulosin (FLOMAX) 0.4 MG CAPS capsule TAKE 1 CAPSULE BY MOUTH ONCE DAILY AFTERSUPPER 30 capsule 5 Past Week at Unknown time  . torsemide (DEMADEX) 20 MG tablet Take 2 tablets (40 mg total) by mouth 2 (two) times daily. 120 tablet 0 Past Week at Unknown time  .  traMADol (ULTRAM) 50 MG tablet Take 1 tablet (50 mg total) by mouth every 6 (six) hours as needed. 20 tablet 0 prn at prn  . colchicine 0.6 MG tablet Take 1 tablet (0.6 mg total) by mouth daily as needed (gout). (Patient not taking: Reported on 02/26/2018) 30 tablet 1 Not Taking at Unknown time  . glipiZIDE (GLUCOTROL XL) 10 MG 24 hr tablet TAKE 1 TABLET BY MOUTH ONCE DAILY WITH BREAKFAST (Patient not taking: Reported on 03/09/2018) 30 tablet 12 Not Taking at Unknown time   Scheduled: . amLODipine  2.5 mg Oral Daily  . atorvastatin  80 mg Oral q1800  . calcium acetate  667 mg Oral TID WC  . clopidogrel  75  mg Oral Daily  . heparin  5,000 Units Subcutaneous Q8H  . insulin aspart  0-15 Units Subcutaneous TID WC  . insulin aspart  0-5 Units Subcutaneous QHS  . insulin aspart  2 Units Subcutaneous TID WC  . insulin glargine  4 Units Subcutaneous Daily  . isosorbide mononitrate  60 mg Oral Daily  . metoprolol succinate  50 mg Oral Daily  . pantoprazole  40 mg Oral Daily  . sodium bicarbonate  650 mg Oral BID  . sodium chloride flush  3 mL Intravenous Q12H  . tamsulosin  0.4 mg Oral QPC supper  . torsemide  40 mg Oral BID  . traZODone  100 mg Oral QHS    Results for orders placed or performed during the hospital encounter of 03/08/18 (from the past 48 hour(s))  Basic metabolic panel     Status: Abnormal   Collection Time: 03/09/18 12:12 AM  Result Value Ref Range   Sodium 135 135 - 145 mmol/L   Potassium 4.6 3.5 - 5.1 mmol/L   Chloride 104 98 - 111 mmol/L   CO2 24 22 - 32 mmol/L   Glucose, Bld 224 (H) 70 - 99 mg/dL   BUN 66 (H) 8 - 23 mg/dL   Creatinine, Ser 4.91 (H) 0.61 - 1.24 mg/dL   Calcium 8.2 (L) 8.9 - 10.3 mg/dL   GFR calc non Af Amer 11 (L) >60 mL/min   GFR calc Af Amer 12 (L) >60 mL/min    Comment: (NOTE) The eGFR has been calculated using the CKD EPI equation. This calculation has not been validated in all clinical situations. eGFR's persistently <60 mL/min signify  possible Chronic Kidney Disease.    Anion gap 7 5 - 15    Comment: Performed at [facility], [address] [place], [place] [address]  CBC     Status: Abnormal   Collection Time: 03/09/18 12:12 AM  Result Value Ref Range   WBC 8.3 4.0 - 10.5 K/uL   RBC 3.22 (L) 4.22 - 5.81 MIL/uL   Hemoglobin 9.0 (L) 13.0 - 17.0 g/dL   HCT 28.7 (L) 39.0 - 52.0 %   MCV 89.1 80.0 - 100.0 fL   MCH 28.0 26.0 - 34.0 pg   MCHC 31.4 30.0 - 36.0 g/dL   RDW 14.6 11.5 - 15.5 %   Platelets 365 150 - 400 K/uL   nRBC 0.0 0.0 - 0.2 %    Comment: Performed at [facility], [address] [place], [place] [address]  Troponin I - ONCE - STAT     Status: Abnormal   Collection Time: 03/09/18 12:12 AM  Result Value Ref Range   Troponin I 0.08 (HH) <0.03 ng/mL    Comment: CRITICAL RESULT CALLED TO, READ BACK BY AND VERIFIED WITH COLE AMORIELLO  @0101  03/09/18 [facility] Performed at [facility], [address] [place], [place] [address]   Brain natriuretic peptide     Status: Abnormal   Collection Time: 03/09/18 12:12 AM  Result Value Ref Range   B Natriuretic Peptide 2,611.0 (H) 0.0 - 100.0 pg/mL    Comment: Performed at [facility], [address] [place],  [address]  Troponin I - Once     Status: Abnormal   Collection Time: 03/09/18  2:43 AM  Result Value Ref Range   Troponin I 0.08 (HH) <0.03 ng/mL    Comment: CRITICAL VALUE NOTED. VALUE IS CONSISTENT WITH PREVIOUSLY REPORTED/CALLED VALUE / FLC Performed at [facility], [phone] [place], [place] [address]   Hepatic function panel     Status: Abnormal   Collection Time: 03/09/18  2:43 AM  Result Value Ref Range   Total Protein 6.2 (L) 6.5 - 8.1 g/dL   Albumin 2.9 (L) 3.5 - 5.0 g/dL   AST 16 15 - 41 U/L   ALT 15 0 - 44 U/L   Alkaline Phosphatase 98 38 - 126 U/L   Total Bilirubin 0.4 0.3 - 1.2 mg/dL   Bilirubin, Direct <0.1 0.0 - 0.2 mg/dL   Indirect Bilirubin NOT CALCULATED 0.3 - 0.9  mg/dL    Comment: Performed at [facility], [address] [place], [place] [address]  Magnesium     Status: None   Collection Time: 03/09/18  2:43 AM  Result Value Ref Range   Magnesium 2.2 1.7 - 2.4 mg/dL    Comment: Performed at [facility], [address] [place], [place] [address]  Phosphorus     Status: Abnormal   Collection Time: 03/09/18  2:43 AM  Result Value Ref Range   Phosphorus 6.0 (H) 2.5 - 4.6 mg/dL    Comment: Performed at [facility], [address] [place], [place] [address]  Calcium, ionized     Status: None   Collection Time: 03/09/18  2:43 AM  Result Value Ref Range   Calcium, Ionized, Serum 4.8 4.5 - 5.6 mg/dL    Comment: (NOTE) Performed At: [facility] [place], [place] [id number] Rush Farmer MD [id number]   Prealbumin     Status: None   Collection Time: 03/09/18  2:43 AM  Result Value Ref Range   Prealbumin 22.6 18 - 38 mg/dL    Comment: Performed at [facility], [place] [address] [place], [place] [address]  Creatinine, urine, random     Status: None   Collection Time: 03/09/18  3:00 AM  Result Value Ref Range   Creatinine, Urine 42 mg/dL    Comment: Performed at [facility], [address] [place], [place] [address]  Protein, urine, random     Status: None   Collection Time: 03/09/18  3:00 AM  Result Value Ref Range   Total Protein, Urine 583 mg/dL    Comment: RESULT CONFIRMED BY MANUAL DILUTION / FLC NO NORMAL RANGE ESTABLISHED FOR THIS TEST Performed at [facility], [address] [place], [place] [address]   Na and K (sodium & potassium), rand urine     Status: None   Collection Time: 03/09/18  3:00 AM  Result Value Ref Range   Sodium, Ur 96 mmol/L   Potassium Urine 23 mmol/L    Comment: Performed at [facility], [address] [place], [place] [address]  Troponin I - Once     Status: Abnormal   Collection Time: 03/09/18  6:05 AM  Result  Value Ref Range   Troponin I 0.09 (HH) <0.03 ng/mL    Comment: CRITICAL VALUE NOTED. VALUE IS CONSISTENT WITH PREVIOUSLY REPORTED/CALLED VALUE TTG/PMH Performed at [facility], [address] [place],  [address]   VITAMIN D 25 Hydroxy (Vit-D Deficiency, Fractures)     Status: Abnormal   Collection Time: 03/09/18  6:05 AM  Result Value Ref Range   Vit D, 25-Hydroxy 12.3 (L) 30.0 - 100.0 ng/mL    Comment: (NOTE) Vitamin D deficiency has been defined by the [place] practice guideline as a level of serum 25-OH vitamin D  less than 20 ng/mL (1,2). The Endocrine Society went on to further define vitamin D insufficiency as a level between 21 and 29 ng/mL (2). 1. IOM (Institute of Medicine). 2010. Dietary reference   intakes for calcium and D. [place]: The   [employer]. 2. Holick MF, Binkley [place], Bischoff-Ferrari HA, et al.   Evaluation, treatment, and prevention of vitamin D   deficiency: an Endocrine Society clinical practice   guideline. JCEM. 2011 Jul; 96(7):1911-30. Performed At: [facility] [place], [place] [id number] Rush Farmer MD [id number]   Parathyroid hormone, intact (no Ca)     Status: Abnormal   Collection Time: 03/09/18  6:05 AM  Result Value Ref Range   PTH 162 (H) 15 - 65 pg/mL    Comment: (NOTE) Performed At: [facility] [address] [place], [place] [id number] Rush Farmer MD [id number]   Glucose, capillary     Status: Abnormal   Collection Time: 03/09/18  7:40 AM  Result Value Ref Range   Glucose-Capillary 134 (H) 70 - 99 mg/dL  Glucose, capillary     Status: None   Collection Time: 03/09/18 11:57 AM  Result Value Ref Range   Glucose-Capillary 94 70 - 99 mg/dL  Glucose, capillary     Status: Abnormal   Collection Time: 03/09/18  4:29 PM  Result Value Ref Range   Glucose-Capillary 145 (H) 70 - 99 mg/dL  Glucose, capillary     Status:  Abnormal   Collection Time: 03/09/18  8:49 PM  Result Value Ref Range   Glucose-Capillary 146 (H) 70 - 99 mg/dL  Basic metabolic panel     Status: Abnormal   Collection Time: 03/10/18  6:59 AM  Result Value Ref Range   Sodium 140 135 - 145 mmol/L   Potassium 4.4 3.5 - 5.1 mmol/L   Chloride 106 98 - 111 mmol/L   CO2 22 22 - 32 mmol/L   Glucose, Bld 122 (H) 70 - 99 mg/dL   BUN 68 (H) 8 - 23 mg/dL   Creatinine, Ser 4.71 (H) 0.61 - 1.24 mg/dL   Calcium 8.2 (L) 8.9 - 10.3 mg/dL   GFR calc non Af Amer 11 (L) >60 mL/min   GFR calc Af Amer 13 (L) >60 mL/min    Comment: (NOTE) The eGFR has been calculated using the CKD EPI equation. This calculation has not been validated in all clinical situations. eGFR's persistently <60 mL/min signify possible Chronic Kidney Disease.    Anion gap 12 5 - 15    Comment: Performed at [facility], [address] [place], [place] [address]  CBC     Status: Abnormal   Collection Time: 03/10/18  6:59 AM  Result Value Ref Range   WBC 7.4 4.0 - 10.5 K/uL   RBC 3.09 (L) 4.22 - 5.81 MIL/uL   Hemoglobin 8.7 (L) 13.0 - 17.0 g/dL   HCT 27.8 (L) 39.0 - 52.0 %   MCV 90.0 80.0 - 100.0 fL   MCH 28.2 26.0 - 34.0 pg   MCHC 31.3 30.0 - 36.0 g/dL   RDW 14.6 11.5 - 15.5 %   Platelets 324 150 - 400 K/uL   nRBC 0.0 0.0 - 0.2 %    Comment: Performed at [facility], [address] [place],  [address]  Glucose, capillary     Status: Abnormal   Collection Time: 03/10/18  8:58 AM  Result Value Ref Range   Glucose-Capillary 124 (H) 70 - 99 mg/dL  Comment 1 Notify RN    Comment 2 Document in Chart     Ct Abdomen Pelvis Wo Contrast  Result Date: 03/09/2018 CLINICAL DATA:  Abdominal distension. EXAM: CT ABDOMEN AND PELVIS WITHOUT CONTRAST TECHNIQUE: Multidetector CT imaging of the abdomen and pelvis was performed following the standard protocol without IV contrast. COMPARISON:  CT scan of July 10, 2017. FINDINGS: Lower chest: Mild right  pleural effusion is noted. Hepatobiliary: No focal liver abnormality is seen. No gallstones, gallbladder wall thickening, or biliary dilatation. Pancreas: Unremarkable. No pancreatic ductal dilatation or surrounding inflammatory changes. Spleen: Normal in size without focal abnormality. Adrenals/Urinary Tract: Adrenal glands are unremarkable. Stable left renal cyst is noted. No hydronephrosis or renal obstruction is noted. No renal or ureteral calculi are noted. Urinary bladder is unremarkable. Stomach/Bowel: Stomach is within normal limits. Appendix appears normal. No evidence of bowel wall thickening, distention, or inflammatory changes. Vascular/Lymphatic: Aortic atherosclerosis. No enlarged abdominal or pelvic lymph nodes. Reproductive: Stable mild prostatic enlargement is noted. Other: Multiple fat containing ventral hernias are noted in the periumbilical and supraumbilical regions. Musculoskeletal: No acute or significant osseous findings. IMPRESSION: Mild right pleural effusion. Multiple fat containing ventral hernias are noted in the periumbilical and supraumbilical regions. Stable mild prostatic enlargement. No acute abnormality seen in the abdomen or pelvis. Aortic Atherosclerosis (ICD10-I70.0). Electronically Signed   By: Marijo Conception, M.D.   On: 03/09/2018 13:58   Dg Chest 2 View  Result Date: 03/08/2018 CLINICAL DATA:  72 y/o M; 1 day of shortness of breath and chest pain. EXAM: CHEST - 2 VIEW COMPARISON:  02/24/2018 chest radiograph. FINDINGS: Stable cardiomegaly given projection and technique. 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 Aortic atherosclerosis with calcification. Reticular and peripheral linear opacities of the lungs. No focal consolidation. Blunted posterior costal diaphragmatic angles. No acute osseous abnormality is evident. IMPRESSION: 1. Interstitial pulmonary edema and possible trace effusions. 2. Stable cardiomegaly given projection and technique. Electronically  Signed   By: Kristine Garbe M.D.   On: 03/08/2018 23:55   US Renal  Result Date: 03/09/2018 CLINICAL DATA:  Acute kidney injury EXAM: RENAL / URINARY TRACT ULTRASOUND COMPLETE COMPARISON:  CT 07/10/2017.  Ultrasound 05/27/2017 FINDINGS: Right Kidney: Renal measurements: 9.1 x 4.7 x 5.3 cm = volume: 118 mL. Small cyst in the upper pole measures up to 1 cm. Cortical thinning and increased echotexture. No hydronephrosis. Left Kidney: Renal measurements: 10.4 x 5.6 x 6.1 cm = volume: 184 mL. 2.3 cm cyst in the upper pole. Cortical thinning and increased echotexture diffusely throughout the left kidney. No hydronephrosis. Bladder: Appears normal for degree of bladder distention. IMPRESSION: Increased echotexture and cortical thinning within the kidneys bilaterally compatible with chronic medical renal disease. No hydronephrosis. No acute findings or change since prior ultrasound. Electronically Signed   By: Rolm Baptise M.D.   On: 03/09/2018 09:39   [place] Ascites (abdomen Limited)  Result Date: 03/09/2018 CLINICAL DATA:  Abdominal distension EXAM: LIMITED ABDOMEN ULTRASOUND FOR ASCITES TECHNIQUE: Limited ultrasound survey for ascites was performed in all four abdominal quadrants. COMPARISON:  None. FINDINGS: Four quadrant survey in the abdomen and pelvis demonstrates no visible ascites. IMPRESSION: No ascites seen. Electronically Signed   By: Rolm Baptise M.D.   On: 03/09/2018 09:37    Review of Systems  Respiratory: Positive for cough and shortness of breath.   All other systems reviewed and are negative.  Blood pressure 138/82, pulse (!) 102, temperature (!) 97.5 F (36.4 C), resp. rate 16, height  5' 6"  (1.676 m), weight 77.5 kg, SpO2 96 %. Physical Exam  Constitutional: He is oriented to person, place, and time. He appears well-developed.  HENT:  Head: Normocephalic.  Neck: Normal range of motion. Neck supple.  Cardiovascular: Normal rate.  Respiratory: Effort normal.  Musculoskeletal:  Normal range of motion. He exhibits no edema.  Neurological: He is alert and oriented to person, place, and time.  Skin: Skin is warm and dry.     Psychiatric: He has a normal mood and affect.    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 He needs dialysis so a temporary catheter will be placed today. Plans will be made for a fistulogram on Monday to assess the problem. I have discussed this with the patient and his wife. They are in agreement.  Juanna Cao 03/10/2018, 9:35 AM

## 2018-03-10 NOTE — Progress Notes (Signed)
Patient ID: Thomas [name], male   DOB: [birth year], 72 y.o.   MRN: [id number]  [facility] PROGRESS NOTE  CRUE OTERO DXA:[id number] DOB: March 17, 1946 [name]: 03/08/2018 PCP: Birdie Sons, MD  HPI/Subjective: Patient feels better.  Has some headache.  Took some Tylenol.  Neck is a little sore from the catheter in his neck.  Objective: Vitals:   03/10/18 0453 03/10/18 0856  BP: 125/75 138/82  Pulse: 89 (!) 102  Resp: 16   Temp: 98.2 F (36.8 C) (!) 97.5 F (36.4 C)  SpO2: 97% 96%    Intake/Output Summary (Last 24 hours) at 03/10/2018 1342 Last data filed at 03/10/2018 1110 Gross per 24 hour  Intake -  Output 1150 ml  Net -1150 ml   Filed Weights   03/08/18 2334 03/10/18 0231  Weight: 75.3 kg 77.5 kg    ROS: Review of Systems  Constitutional: Negative for chills and fever.  Eyes: Negative for blurred vision.  Respiratory: Negative for cough and shortness of breath.   Cardiovascular: Negative for chest pain.  Gastrointestinal: Negative for abdominal pain, constipation, diarrhea, nausea and vomiting.  Genitourinary: Negative for dysuria.  Musculoskeletal: Positive for neck pain. Negative for joint pain.  Neurological: Positive for headaches. Negative for dizziness.   Exam: Physical Exam  Constitutional: He is oriented to person, place, and time.  HENT:  Nose: No mucosal edema.  Mouth/Throat: No oropharyngeal exudate or posterior oropharyngeal edema.  Eyes: Pupils are equal, round, and reactive to light. Conjunctivae, EOM and lids are normal.  Neck: No JVD present. Carotid bruit is not present. No edema present. No thyroid mass and no thyromegaly present.  Cardiovascular: S1 normal and S2 normal. Exam reveals no gallop.  No murmur heard. Pulses:      Dorsalis pedis pulses are 2+ on the right side, and 2+ on the left side.  Respiratory: No respiratory distress. He has decreased breath sounds in the right lower field and the left lower field. He has no  wheezes. He has no rhonchi. He has no rales.  GI: Soft. Bowel sounds are normal. There is no tenderness.  Musculoskeletal:       Right ankle: He exhibits swelling.       Left ankle: He exhibits swelling.  Lymphadenopathy:    He has no cervical adenopathy.  Neurological: He is alert and oriented to person, place, and time. No cranial nerve deficit.  Skin: Skin is warm. No rash noted. Nails show no clubbing.  Psychiatric: He has a normal mood and affect.      Data Reviewed: Basic Metabolic Panel: Recent Labs  Lab 03/09/18 0012 03/09/18 0243 03/10/18 0659  NA 135  --  140  K 4.6  --  4.4  CL 104  --  106  CO2 24  --  22  GLUCOSE 224*  --  122*  BUN 66*  --  68*  CREATININE 4.91*  --  4.71*  CALCIUM 8.2*  --  8.2*  MG  --  2.2  --   PHOS  --  6.0*  --    Liver Function Tests: Recent Labs  Lab 03/09/18 0243  AST 16  ALT 15  ALKPHOS 98  BILITOT 0.4  PROT 6.2*  ALBUMIN 2.9*   CBC: Recent Labs  Lab 03/09/18 0012 03/10/18 0659  WBC 8.3 7.4  HGB 9.0* 8.7*  HCT 28.7* 27.8*  MCV 89.1 90.0  PLT 365 324   Cardiac Enzymes: Recent Labs  Lab 03/09/18 0012 03/09/18 0243 03/09/18  6269  TROPONINI 0.08* 0.08* 0.09*   BNP (last 3 results) Recent Labs    02/24/18 0159 02/25/18 0924 03/09/18 0012  BNP 3,410.0* 2,048.0* 2,611.0*     CBG: Recent Labs  Lab 03/09/18 1157 03/09/18 1629 03/09/18 2049 03/10/18 0858 03/10/18 1218  GLUCAP 94 145* 146* 124* 156*     Studies: Ct Abdomen Pelvis Wo Contrast  Result Date: 03/09/2018 CLINICAL DATA:  Abdominal distension. EXAM: CT ABDOMEN AND PELVIS WITHOUT CONTRAST TECHNIQUE: Multidetector CT imaging of the abdomen and pelvis was performed following the standard protocol without IV contrast. COMPARISON:  CT scan of July 10, 2017. FINDINGS: Lower chest: Mild right pleural effusion is noted. Hepatobiliary: No focal liver abnormality is seen. No gallstones, gallbladder wall thickening, or biliary dilatation. Pancreas:  Unremarkable. No pancreatic ductal dilatation or surrounding inflammatory changes. Spleen: Normal in size without focal abnormality. Adrenals/Urinary Tract: Adrenal glands are unremarkable. Stable left renal cyst is noted. No hydronephrosis or renal obstruction is noted. No renal or ureteral calculi are noted. Urinary bladder is unremarkable. Stomach/Bowel: Stomach is within normal limits. Appendix appears normal. No evidence of bowel wall thickening, distention, or inflammatory changes. Vascular/Lymphatic: Aortic atherosclerosis. No enlarged abdominal or pelvic lymph nodes. Reproductive: Stable mild prostatic enlargement is noted. Other: Multiple fat containing ventral hernias are noted in the periumbilical and supraumbilical regions. Musculoskeletal: No acute or significant osseous findings. IMPRESSION: Mild right pleural effusion. Multiple fat containing ventral hernias are noted in the periumbilical and supraumbilical regions. Stable mild prostatic enlargement. No acute abnormality seen in the abdomen or pelvis. Aortic Atherosclerosis (ICD10-I70.0). Electronically Signed   By: Marijo Conception, M.D.   On: 03/09/2018 13:58   Dg Chest 2 View  Result Date: 03/08/2018 CLINICAL DATA:  72 y/o M; 1 day of shortness of breath and chest pain. EXAM: CHEST - 2 VIEW COMPARISON:  02/24/2018 chest radiograph. FINDINGS: Stable cardiomegaly given projection and technique. 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 Aortic atherosclerosis with calcification. Reticular and peripheral linear opacities of the lungs. No focal consolidation. Blunted posterior costal diaphragmatic angles. No acute osseous abnormality is evident. IMPRESSION: 1. Interstitial pulmonary edema and possible trace effusions. 2. Stable cardiomegaly given projection and technique. Electronically Signed   By: Kristine Garbe M.D.   On: 03/08/2018 23:55   US Renal  Result Date: 03/09/2018 CLINICAL DATA:  Acute kidney injury EXAM: RENAL  / URINARY TRACT ULTRASOUND COMPLETE COMPARISON:  CT 07/10/2017.  Ultrasound 05/27/2017 FINDINGS: Right Kidney: Renal measurements: 9.1 x 4.7 x 5.3 cm = volume: 118 mL. Small cyst in the upper pole measures up to 1 cm. Cortical thinning and increased echotexture. No hydronephrosis. Left Kidney: Renal measurements: 10.4 x 5.6 x 6.1 cm = volume: 184 mL. 2.3 cm cyst in the upper pole. Cortical thinning and increased echotexture diffusely throughout the left kidney. No hydronephrosis. Bladder: Appears normal for degree of bladder distention. IMPRESSION: Increased echotexture and cortical thinning within the kidneys bilaterally compatible with chronic medical renal disease. No hydronephrosis. No acute findings or change since prior ultrasound. Electronically Signed   By: Rolm Baptise M.D.   On: 03/09/2018 09:39   Dg Chest Port 1 View  Result Date: 03/10/2018 CLINICAL DATA:  Central line placement. EXAM: PORTABLE CHEST 1 VIEW COMPARISON:  03/08/2018 and prior exams FINDINGS: A RIGHT IJ central venous catheter is noted with tip overlying the SUPERIOR cavoatrial junction. Cardiomegaly and CABG changes again noted. Mild interstitial prominence again noted. There is no evidence of pneumothorax or large pleural effusion. IMPRESSION:  RIGHT IJ central venous catheter placement with tip overlying the SUPERIOR cavoatrial junction. No evidence of pneumothorax. Cardiomegaly with mild interstitial prominence again noted. Electronically Signed   By: Margarette Canada M.D.   On: 03/10/2018 11:29   [place] Ascites (abdomen Limited)  Result Date: 03/09/2018 CLINICAL DATA:  Abdominal distension EXAM: LIMITED ABDOMEN ULTRASOUND FOR ASCITES TECHNIQUE: Limited ultrasound survey for ascites was performed in all four abdominal quadrants. COMPARISON:  None. FINDINGS: Four quadrant survey in the abdomen and pelvis demonstrates no visible ascites. IMPRESSION: No ascites seen. Electronically Signed   By: Rolm Baptise M.D.   On: 03/09/2018 09:37     Scheduled Meds: . amLODipine  2.5 mg Oral Daily  . atorvastatin  80 mg Oral q1800  . calcium acetate  667 mg Oral TID WC  . clopidogrel  75 mg Oral Daily  . heparin  5,000 Units Subcutaneous Q8H  . insulin aspart  0-15 Units Subcutaneous TID WC  . insulin aspart  0-5 Units Subcutaneous QHS  . insulin aspart  2 Units Subcutaneous TID WC  . insulin glargine  4 Units Subcutaneous Daily  . isosorbide mononitrate  60 mg Oral Daily  . metoprolol succinate  50 mg Oral Daily  . pantoprazole  40 mg Oral Daily  . sodium bicarbonate  650 mg Oral BID  . sodium chloride flush  3 mL Intravenous Q12H  . tamsulosin  0.4 mg Oral QPC supper  . torsemide  40 mg Oral BID  . traZODone  100 mg Oral QHS   Continuous Infusions: . sodium chloride      Assessment/Plan:  1. Acute systolic CHF.  Dialysis to manage fluid.  Patient also on torsemide. 2. 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  Temporary catheter placed in the right neck and will have first dialysis today.  Outpatient dialysis planning for the venogram. 3. Type 2 diabetes mellitus with chronic kidney disease.  On glargine insulin and short acting insulin. 4. Anemia of chronic disease on Epogen 5. Ventral hernia. 6. GERD on PPI 7. Hyperlipidemia unspecified on atorvastatin 8. Hypertension on Norvasc metoprolol  Code Status:     Code Status Orders  (From admission, onward)         Start     Ordered   03/09/18 0234  Full code  Continuous     03/09/18 0233        Code Status History    Date Active Date Inactive Code Status Order ID Comments User Context   02/24/2018 0450 02/25/2018 1419 Full Code [id number]  Amelia Jo, MD Inpatient   07/17/2017 0059 07/18/2017 1826 Full Code [id number]  Amelia Jo, MD Inpatient   06/10/2017 1610 06/14/2017 1308 Full Code [id number]  Henreitta Leber, MD Inpatient   05/25/2017 0024 05/30/2017 1325 Full Code [id number]  Lance Coon, MD Inpatient   11/14/2016 0938  11/14/2016 1442 Full Code [id number]  Algernon Huxley, MD Inpatient   10/31/2016 1121 10/31/2016 1817 Full Code [id number]  Algernon Huxley, MD Inpatient   10/18/2016 1357 10/18/2016 1850 Full Code [id number]  Isaias Cowman, MD Inpatient     Family Communication: Permission to speak in front of quite a few family members at the bedside Disposition Plan: Likely discharge next week after outpatient dialysis is settled on and the patient has a functioning access and after 3 dialysis sessions  Consultants:  Nephrology  Vascular surgery  Procedures:  Temporary catheter right neck  Time spent: 28 minutes  [place]

## 2018-03-10 NOTE — Progress Notes (Signed)
This note also relates to the following rows which could not be included: Pulse Rate - Cannot attach notes to unvalidated device data Resp - Cannot attach notes to unvalidated device data  Hd completed  

## 2018-03-10 NOTE — Plan of Care (Signed)
  Problem: Health Behavior/Discharge Planning: Goal: Ability to manage health-related needs will improve Outcome: Progressing Note:  Patient for R IJ HD tempcath today, patient is now in HD department receiving UF treatment session. Patient for OP fistulogram according to the physician's progress note today. Will continue to monitor HD progression. Wenda Low [facility]

## 2018-03-11 LAB — GLUCOSE, CAPILLARY
GLUCOSE-CAPILLARY: 106 mg/dL — AB (ref 70–99)
GLUCOSE-CAPILLARY: 184 mg/dL — AB (ref 70–99)
GLUCOSE-CAPILLARY: 182 mg/dL — AB (ref 70–99)
Glucose-Capillary: 124 mg/dL — ABNORMAL HIGH (ref 70–99)

## 2018-03-11 LAB — BASIC METABOLIC PANEL
Anion gap: 8 (ref 5–15)
BUN: 48 mg/dL — AB (ref 8–23)
CALCIUM: 8.1 mg/dL — AB (ref 8.9–10.3)
CO2: 26 mmol/L (ref 22–32)
CREATININE: 3.68 mg/dL — AB (ref 0.61–1.24)
Chloride: 106 mmol/L (ref 98–111)
GFR calc Af Amer: 17 mL/min — ABNORMAL LOW (ref >60)
GFR calc non Af Amer: 15 mL/min — ABNORMAL LOW (ref >60)
GLUCOSE: 132 mg/dL — AB (ref 70–99)
Potassium: 4.2 mmol/L (ref 3.5–5.1)
Sodium: 140 mmol/L (ref 135–145)

## 2018-03-11 LAB — HEPATITIS B CORE ANTIBODY, TOTAL: HEP B C TOTAL AB: NEGATIVE

## 2018-03-11 LAB — MAGNESIUM: Magnesium: 1.9 mg/dL (ref 1.7–2.4)

## 2018-03-11 LAB — PHOSPHORUS: Phosphorus: 4.6 mg/dL (ref 2.5–4.6)

## 2018-03-11 LAB — HEPATITIS B SURFACE ANTIBODY,QUALITATIVE: Hep B S Ab: NONREACTIVE

## 2018-03-11 LAB — HEPATITIS C ANTIBODY

## 2018-03-11 NOTE — Progress Notes (Signed)
[facility] Cardiology [facility] Encounter Note  Patient: Thomas [name] / Admit Date: 03/08/2018 / Date of Encounter: 03/11/2018, 6:29 AM   Subjective: The patient has had significant improvements in his shortness of breath pulmonary edema and acute on chronic systolic dysfunction congestive heart failure after initiation of dialysis likely secondary to chronic kidney disease stage V.  The patient has had an attempt for dialysis earlier in the hospitalization but this is not worked very well due to his shunt not working well and then needed a temporary catheter.  There is no evidence of myocardial infarction with a peak troponin of 0.09 more consistent with demand ischemia rather than acute coronary syndrome  Review of Systems: Positive for: None Negative for: Vision change, hearing change, syncope, dizziness, nausea, vomiting,diarrhea, bloody stool, stomach pain, cough, congestion, diaphoresis, urinary frequency, urinary pain,skin lesions, skin rashes Others previously listed  Objective: Telemetry: Normal sinus rhythm Physical Exam: Blood pressure 130/70, pulse 86, temperature 97.8 F (36.6 C), resp. rate 18, height 5\' 6"  (1.676 m), weight 77.2 kg, SpO2 96 %. Body mass index is 27.45 kg/m. General: Well developed, well nourished, in no acute distress. Head: Normocephalic, atraumatic, sclera non-icteric, no xanthomas, nares are without discharge. Neck: No apparent masses Lungs: Normal respirations with no wheezes, no rhonchi, no rales , few basilar crackles   Heart: Regular rate and rhythm, normal S1 S2, no murmur, no rub, no gallop, PMI is normal size and placement, carotid upstroke normal without bruit, jugular venous pressure normal Abdomen: Soft, non-tender, non-distended with normoactive bowel sounds. No hepatosplenomegaly. Abdominal aorta is normal size without bruit Extremities: Trace edema, no clubbing, no cyanosis, no ulcers,  Peripheral: 2+ radial, 2+ femoral, 2+ dorsal pedal  pulses Neuro: Alert and oriented. Moves all extremities spontaneously. Psych:  Responds to questions appropriately with a normal affect.   Intake/Output Summary (Last 24 hours) at 03/11/2018 0629 Last data filed at 03/11/2018 0215 Gross per 24 hour  Intake 240 ml  Output 1125 ml  Net -885 ml    Inpatient Medications:  . amLODipine  2.5 mg Oral Daily  . atorvastatin  80 mg Oral q1800  . calcium acetate  667 mg Oral TID WC  . clopidogrel  75 mg Oral Daily  . heparin  5,000 Units Subcutaneous Q8H  . insulin aspart  0-15 Units Subcutaneous TID WC  . insulin aspart  0-5 Units Subcutaneous QHS  . insulin aspart  2 Units Subcutaneous TID WC  . insulin glargine  4 Units Subcutaneous Daily  . isosorbide mononitrate  60 mg Oral Daily  . metoprolol succinate  50 mg Oral Daily  . pantoprazole  40 mg Oral Daily  . sodium bicarbonate  650 mg Oral BID  . sodium chloride flush  10-40 mL Intracatheter Q12H  . sodium chloride flush  3 mL Intravenous Q12H  . tamsulosin  0.4 mg Oral QPC supper  . torsemide  40 mg Oral BID  . traZODone  100 mg Oral QHS   Infusions:  . sodium chloride      Labs: Recent Labs    03/09/18 0243 03/10/18 0659 03/11/18 0356 03/11/18 0540  NA  --  140 140  --   K  --  4.4 4.2  --   CL  --  106 106  --   CO2  --  22 26  --   GLUCOSE  --  122* 132*  --   BUN  --  68* 48*  --   CREATININE  --  4.71* 3.68*  --   CALCIUM  --  8.2* 8.1*  --   MG 2.2  --   --  1.9  PHOS 6.0*  --   --  4.6   Recent Labs    03/09/18 0243  AST 16  ALT 15  ALKPHOS 98  BILITOT 0.4  PROT 6.2*  ALBUMIN 2.9*   Recent Labs    03/09/18 0012 03/10/18 0659  WBC 8.3 7.4  HGB 9.0* 8.7*  HCT 28.7* 27.8*  MCV 89.1 90.0  PLT 365 324   Recent Labs    03/09/18 0012 03/09/18 0243 03/09/18 0605  TROPONINI 0.08* 0.08* 0.09*   Invalid input(s): POCBNP No results for input(s): HGBA1C in the last 72 hours.   Weights: Filed Weights   03/10/18 0231 03/10/18 2106 03/11/18 0214   Weight: 77.5 kg 77.1 kg 77.2 kg     Radiology/Studies:  Ct Abdomen Pelvis Wo Contrast  Result Date: 03/09/2018 CLINICAL DATA:  Abdominal distension. EXAM: CT ABDOMEN AND PELVIS WITHOUT CONTRAST TECHNIQUE: Multidetector CT imaging of the abdomen and pelvis was performed following the standard protocol without IV contrast. COMPARISON:  CT scan of July 10, 2017. FINDINGS: Lower chest: Mild right pleural effusion is noted. Hepatobiliary: No focal liver abnormality is seen. No gallstones, gallbladder wall thickening, or biliary dilatation. Pancreas: Unremarkable. No pancreatic ductal dilatation or surrounding inflammatory changes. Spleen: Normal in size without focal abnormality. Adrenals/Urinary Tract: Adrenal glands are unremarkable. Stable left renal cyst is noted. No hydronephrosis or renal obstruction is noted. No renal or ureteral calculi are noted. Urinary bladder is unremarkable. Stomach/Bowel: Stomach is within normal limits. Appendix appears normal. No evidence of bowel wall thickening, distention, or inflammatory changes. Vascular/Lymphatic: Aortic atherosclerosis. No enlarged abdominal or pelvic lymph nodes. Reproductive: Stable mild prostatic enlargement is noted. Other: Multiple fat containing ventral hernias are noted in the periumbilical and supraumbilical regions. Musculoskeletal: No acute or significant osseous findings. IMPRESSION: Mild right pleural effusion. Multiple fat containing ventral hernias are noted in the periumbilical and supraumbilical regions. Stable mild prostatic enlargement. No acute abnormality seen in the abdomen or pelvis. Aortic Atherosclerosis (ICD10-I70.0). Electronically Signed   By: Marijo Conception, M.D.   On: 03/09/2018 13:58   Dg Chest 2 View  Result Date: 03/08/2018 CLINICAL DATA:  72 y/o M; 74 day of shortness of breath and chest pain. EXAM: CHEST - 2 VIEW COMPARISON:  02/24/2018 chest radiograph. FINDINGS: Stable cardiomegaly given projection and technique.  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 Aortic atherosclerosis with calcification. Reticular and peripheral linear opacities of the lungs. No focal consolidation. Blunted posterior costal diaphragmatic angles. No acute osseous abnormality is evident. IMPRESSION: 1. Interstitial pulmonary edema and possible trace effusions. 2. Stable cardiomegaly given projection and technique. Electronically Signed   By: Kristine Garbe M.D.   On: 03/08/2018 23:55   Dg Chest 2 View  Result Date: 02/24/2018 CLINICAL DATA:  Acute onset of shortness of breath. EXAM: CHEST - 2 VIEW COMPARISON:  Chest radiograph performed 02/21/2018 FINDINGS: The lungs are well-aerated. Vascular congestion is noted. Mild peribronchial thickening is noted. There is no evidence of pleural effusion or pneumothorax. The heart is borderline enlarged. No acute osseous abnormalities are seen. IMPRESSION: Vascular congestion and borderline cardiomegaly. Mild peribronchial thickening noted. Electronically Signed   By: Garald Balding M.D.   On: 02/24/2018 02:38   Dg Chest 2 View  Result Date: 02/21/2018 CLINICAL DATA:  Shortness of breath. EXAM: CHEST - 2 VIEW COMPARISON:  02/19/2018. 05/24/2017.  CT 06/14/2016. FINDINGS: Prior CABG. Cardiomegaly with mild pulmonary vascular prominence and bilateral interstitial prominence again noted. Mild CHF could present this fashion. Pneumonitis cannot be completely excluded. No acute bony abnormality identified. IMPRESSION: Prior CABG. Cardiomegaly with mild pulmonary vascular prominence and bilateral interstitial prominence again noted. Findings suggest mild CHF. Electronically Signed   By: Marcello Moores  Register   On: 02/21/2018 14:18   Dg Chest 2 View  Result Date: 02/19/2018 CLINICAL DATA:  Difficulty breathing for the past 4 5 days, orthopnea, and cough. History of asthma, previous MI, former smoker. EXAM: CHEST - 2 VIEW COMPARISON:  PA and lateral chest x-ray of July 16, 2017 FINDINGS: The  lungs are mildly hyperinflated with hemidiaphragm flattening. The interstitial markings are mildly increased especially in the lower lobes. There is no alveolar infiltrate. The cardiac silhouette is enlarged. The pulmonary vascularity is not engorged. There are post CABG changes. There is no pleural effusion. There is calcification in the wall of the aortic arch. The bony thorax exhibits no acute abnormality. IMPRESSION: Chronic bronchitic changes. No alveolar pneumonia. One cannot exclude low-grade compensated CHF. Electronically Signed   By: David  Martinique M.D.   On: 02/19/2018 15:28   US Renal  Result Date: 03/09/2018 CLINICAL DATA:  Acute kidney injury EXAM: RENAL / URINARY TRACT ULTRASOUND COMPLETE COMPARISON:  CT 07/10/2017.  Ultrasound 05/27/2017 FINDINGS: Right Kidney: Renal measurements: 9.1 x 4.7 x 5.3 cm = volume: 118 mL. Small cyst in the upper pole measures up to 1 cm. Cortical thinning and increased echotexture. No hydronephrosis. Left Kidney: Renal measurements: 10.4 x 5.6 x 6.1 cm = volume: 184 mL. 2.3 cm cyst in the upper pole. Cortical thinning and increased echotexture diffusely throughout the left kidney. No hydronephrosis. Bladder: Appears normal for degree of bladder distention. IMPRESSION: Increased echotexture and cortical thinning within the kidneys bilaterally compatible with chronic medical renal disease. No hydronephrosis. No acute findings or change since prior ultrasound. Electronically Signed   By: Rolm Baptise M.D.   On: 03/09/2018 09:39   Dg Chest Port 1 View  Result Date: 03/10/2018 CLINICAL DATA:  Central line placement. EXAM: PORTABLE CHEST 1 VIEW COMPARISON:  03/08/2018 and prior exams FINDINGS: A RIGHT IJ central venous catheter is noted with tip overlying the SUPERIOR cavoatrial junction. Cardiomegaly and CABG changes again noted. Mild interstitial prominence again noted. There is no evidence of pneumothorax or large pleural effusion. IMPRESSION: RIGHT IJ central  venous catheter placement with tip overlying the SUPERIOR cavoatrial junction. No evidence of pneumothorax. Cardiomegaly with mild interstitial prominence again noted. Electronically Signed   By: Margarette Canada M.D.   On: 03/10/2018 11:29   Dg Abd 2 Views  Result Date: 02/19/2018 CLINICAL DATA:  Abdominal bloating and distension and tenderness over the mid upper abdomen since beginning a new diabetes medication 8 weeks ago. EXAM: ABDOMEN - 2 VIEW COMPARISON:  Abdominal and pelvic CT scan of July 10, 2017 FINDINGS: The bowel gas pattern is within the limits of normal. The stool burden is moderate. There is no small or large bowel obstructive pattern. There are no abnormal soft tissue calcifications. There surgical clips overlying the right aspect of the a sacrum and at the base of the right lung medially. The bony structures exhibit no acute abnormalities. IMPRESSION: No evidence of bowel obstruction, ileus, perforation, or constipation. No definite acute intra-abdominal abnormality. Electronically Signed   By: David  Martinique M.D.   On: 02/19/2018 15:31   [place] Ascites (abdomen Limited)  Result Date: 03/09/2018 CLINICAL DATA:  Abdominal distension EXAM: LIMITED ABDOMEN ULTRASOUND FOR ASCITES TECHNIQUE: Limited ultrasound survey for ascites was performed in all four abdominal quadrants. COMPARISON:  None. FINDINGS: Four quadrant survey in the abdomen and pelvis demonstrates no visible ascites. IMPRESSION: No ascites seen. Electronically Signed   By: Rolm Baptise M.D.   On: 03/09/2018 09:37     Assessment and Recommendation  72 y.o. male with known coronary artery disease status post coronary bypass graft and previous stenting with peripheral vascular disease and acute on chronic systolic dysfunction congestive heart failure secondary to worsening chronic kidney disease stage V with elevated troponin consistent with demand ischemia rather than acute coronary syndrome. 1.  Continue supportive care for stable LV  systolic dysfunction including beta-blocker and amlodipine and would consider addition of ACE inhibitor or angiotensin receptor blocker at this time now the patient is on dialysis for further treatment of chronic systolic dysfunction heart failure 2.  Continuation of dialysis without restriction 4 chronic kidney disease stage V and acute on chronic systolic dysfunction heart failure 3.  No further intervention or further cardiac diagnostics necessary at this time due to no evidence of significant change in cardiovascular status with troponin consistent with demand ischemia 4.  Begin ambulation and follow for improvements of symptoms and further treatment options after above 5.  Patient is at low risk for further vascular intervention of left arm shunt due to its poor performance at this time  Signed, Serafina Royals M.D. FACC

## 2018-03-11 NOTE — Progress Notes (Signed)
[facility]  ROUNDING NOTE   Subjective:   First hemodialysis treatment yesterday. Tolerated treatment well. RIJ temp catheter.   Wife at bedside. Patient states his edema has improved.   Objective:  Vital signs in last 24 hours:  Temp:  [97.7 F (36.5 C)-98 F (36.7 C)] 98 F (36.7 C) (11/24 0721) Pulse Rate:  [81-92] 83 (11/24 0721) Resp:  [16-19] 18 (11/24 0546) BP: (110-148)/(62-88) 129/68 (11/24 0721) SpO2:  [94 %-98 %] 97 % (11/24 0721) Weight:  [77.1 kg-77.2 kg] 77.2 kg (11/24 0214)  Weight change: -0.408 kg Filed Weights   03/10/18 0231 03/10/18 2106 03/11/18 0214  Weight: 77.5 kg 77.1 kg 77.2 kg    Intake/Output: I/O last 3 completed shifts: In: 240 [P.O.:240] Out: 1700 [Urine:1200; Other:500]   Intake/Output this shift:  Total I/O In: 240 [P.O.:240] Out: -   Physical Exam: General: NAD,   Head: Normocephalic, atraumatic. Moist oral mucosal membranes  Eyes: Anicteric, PERRL  Neck: Supple, trachea midline  Lungs:  crackles  Heart: Regular rate and rhythm  Abdomen:  Soft, nontender,   Extremities:  1+ peripheral edema.  Neurologic: Nonfocal, moving all four extremities  Skin: No lesions  Access: Left AVF +bruit, +thrill    Basic Metabolic Panel: Recent Labs  Lab 03/09/18 0012 03/09/18 0243 03/10/18 0659 03/11/18 0356 03/11/18 0540  NA 135  --  140 140  --   K 4.6  --  4.4 4.2  --   CL 104  --  106 106  --   CO2 24  --  22 26  --   GLUCOSE 224*  --  122* 132*  --   BUN 66*  --  68* 48*  --   CREATININE 4.91*  --  4.71* 3.68*  --   CALCIUM 8.2*  --  8.2* 8.1*  --   MG  --  2.2  --   --  1.9  PHOS  --  6.0*  --   --  4.6    Liver Function Tests: Recent Labs  Lab 03/09/18 0243  AST 16  ALT 15  ALKPHOS 98  BILITOT 0.4  PROT 6.2*  ALBUMIN 2.9*   No results for input(s): LIPASE, AMYLASE in the last 168 hours. No results for input(s): AMMONIA in the last 168 hours.  CBC: Recent Labs  Lab 03/09/18 0012 03/10/18 0659   WBC 8.3 7.4  HGB 9.0* 8.7*  HCT 28.7* 27.8*  MCV 89.1 90.0  PLT 365 324    Cardiac Enzymes: Recent Labs  Lab 03/09/18 0012 03/09/18 0243 03/09/18 0605  TROPONINI 0.08* 0.08* 0.09*    BNP: Invalid input(s): POCBNP  CBG: Recent Labs  Lab 03/10/18 0858 03/10/18 1218 03/10/18 2107 03/11/18 0724 03/11/18 1140  GLUCAP 124* 156* 185* 124* 106*    Microbiology: Results for orders placed or performed in visit on 07/03/17  Urine Culture     Status: None   Collection Time: 07/03/17 12:14 PM  Result Value Ref Range Status   Urine Culture, Routine Final report  Final   Organism ID, Bacteria Comment  Final    Comment: Mixed urogenital flora Less than 10,000 colonies/mL     Coagulation Studies: No results for input(s): LABPROT, INR in the last 72 hours.  Urinalysis: No results for input(s): COLORURINE, LABSPEC, PHURINE, GLUCOSEU, HGBUR, BILIRUBINUR, KETONESUR, PROTEINUR, UROBILINOGEN, NITRITE, LEUKOCYTESUR in the last 72 hours.  Invalid input(s): APPERANCEUR    Imaging: Ct Abdomen Pelvis Wo Contrast  Result Date: 03/09/2018 CLINICAL DATA:  Abdominal distension. EXAM: CT ABDOMEN AND PELVIS WITHOUT CONTRAST TECHNIQUE: Multidetector CT imaging of the abdomen and pelvis was performed following the standard protocol without IV contrast. COMPARISON:  CT scan of July 10, 2017. FINDINGS: Lower chest: Mild right pleural effusion is noted. Hepatobiliary: No focal liver abnormality is seen. No gallstones, gallbladder wall thickening, or biliary dilatation. Pancreas: Unremarkable. No pancreatic ductal dilatation or surrounding inflammatory changes. Spleen: Normal in size without focal abnormality. Adrenals/Urinary Tract: Adrenal glands are unremarkable. Stable left renal cyst is noted. No hydronephrosis or renal obstruction is noted. No renal or ureteral calculi are noted. Urinary bladder is unremarkable. Stomach/Bowel: Stomach is within normal limits. Appendix appears normal. No  evidence of bowel wall thickening, distention, or inflammatory changes. Vascular/Lymphatic: Aortic atherosclerosis. No enlarged abdominal or pelvic lymph nodes. Reproductive: Stable mild prostatic enlargement is noted. Other: Multiple fat containing ventral hernias are noted in the periumbilical and supraumbilical regions. Musculoskeletal: No acute or significant osseous findings. IMPRESSION: Mild right pleural effusion. Multiple fat containing ventral hernias are noted in the periumbilical and supraumbilical regions. Stable mild prostatic enlargement. No acute abnormality seen in the abdomen or pelvis. Aortic Atherosclerosis (ICD10-I70.0). Electronically Signed   By: Marijo Conception, M.D.   On: 03/09/2018 13:58   Dg Chest Port 1 View  Result Date: 03/10/2018 CLINICAL DATA:  Central line placement. EXAM: PORTABLE CHEST 1 VIEW COMPARISON:  03/08/2018 and prior exams FINDINGS: A RIGHT IJ central venous catheter is noted with tip overlying the SUPERIOR cavoatrial junction. Cardiomegaly and CABG changes again noted. Mild interstitial prominence again noted. There is no evidence of pneumothorax or large pleural effusion. IMPRESSION: RIGHT IJ central venous catheter placement with tip overlying the SUPERIOR cavoatrial junction. No evidence of pneumothorax. Cardiomegaly with mild interstitial prominence again noted. Electronically Signed   By: Margarette Canada M.D.   On: 03/10/2018 11:29     Medications:   . sodium chloride     . amLODipine  2.5 mg Oral Daily  . atorvastatin  80 mg Oral q1800  . calcium acetate  667 mg Oral TID WC  . clopidogrel  75 mg Oral Daily  . heparin  5,000 Units Subcutaneous Q8H  . insulin aspart  0-15 Units Subcutaneous TID WC  . insulin aspart  0-5 Units Subcutaneous QHS  . insulin aspart  2 Units Subcutaneous TID WC  . insulin glargine  4 Units Subcutaneous Daily  . isosorbide mononitrate  60 mg Oral Daily  . metoprolol succinate  50 mg Oral Daily  . pantoprazole  40 mg Oral  Daily  . sodium bicarbonate  650 mg Oral BID  . sodium chloride flush  10-40 mL Intracatheter Q12H  . sodium chloride flush  3 mL Intravenous Q12H  . tamsulosin  0.4 mg Oral QPC supper  . torsemide  40 mg Oral BID  . traZODone  100 mg Oral QHS   sodium chloride, acetaminophen, bisacodyl, morphine injection, nitroGLYCERIN, ondansetron (ZOFRAN) IV, senna-docusate, sodium chloride flush  Assessment/ Plan:  Mr. NYSHAWN GOWDY is a 72 y.o. white male with diabetes, hypertension, severe coronary disease, CABG 1996, peripheral arterial disease, congestive heart failure.   1. End stage renal disease. First hemodialysis was 11/23 through RIJ temp catheter. AVF was attempted on 11/22 however patient's fistula was determined to not be mature.  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 With complication of dialysis device. Second hemodialysis treatment for tomorrow.  - Patient will need tunneled catheter placed before discharge. Appreciate vascular input. Fistulogram scheduled for tomorrow.  - Outpatient  planning for [facility]  2. Hypertension and Acute exacerbation of chronic systolic congestive heart failure:   - ultrafiltration with dialysis.  - will need to start ARB before discharge.   3. Diabetes mellitus type II with chronic kidney disease: history of poor control. Hemoglobin A1c 9.3% on 01/24/18.   4. Anemia of chronic kidney disease: hemoglobin 8.7 - EPO with HD treatments   LOS: 2 Meliya Mcconahy 11/24/20191:11 PM

## 2018-03-11 NOTE — Progress Notes (Signed)
Patient had 8 beats of v tach. MD Prasanna notified. MD placed orders for lab work. Will continue to monitor.  Thomas [name]

## 2018-03-11 NOTE — Plan of Care (Signed)
  Problem: Education: Goal: Knowledge of General Education information will improve Description Including pain rating scale, medication(s)/side effects and non-pharmacologic comfort measures Outcome: Progressing   Problem: Health Behavior/Discharge Planning: Goal: Ability to manage health-related needs will improve Outcome: Progressing   Problem: Clinical Measurements: Goal: Will remain free from infection Outcome: Progressing   Problem: Education: Goal: Ability to demonstrate management of disease process will improve Outcome: Progressing Goal: Ability to verbalize understanding of medication therapies will improve Outcome: Progressing

## 2018-03-11 NOTE — Progress Notes (Signed)
Per ccmd pt had 6 beat run of v-tach, pt asymptomatic, Dr. Nehemiah Massed notified, no new orders received.

## 2018-03-11 NOTE — Progress Notes (Signed)
Patient ID: Thomas [name], male   DOB: Jan 10, 1946, 72 y.o.   MRN: [id number]  [facility] PROGRESS NOTE  Thomas [name] [name] DOB: [birth year] [name]: 03/08/2018 PCP: Thomas Sons, MD  HPI/Subjective: Patient with some neck pain because of the catheter.  Had some headache.  Also had some nausea today.  Objective: Vitals:   03/11/18 0546 03/11/18 0721  BP: 130/70 129/68  Pulse: 86 83  Resp: 18   Temp: 97.8 F (36.6 C) 98 F (36.7 C)  SpO2: 96% 97%    Filed Weights   03/10/18 0231 03/10/18 2106 03/11/18 0214  Weight: 77.5 kg 77.1 kg 77.2 kg    ROS: Review of Systems  Constitutional: Negative for chills and fever.  Eyes: Negative for blurred vision.  Respiratory: Negative for cough and shortness of breath.   Cardiovascular: Negative for chest pain.  Gastrointestinal: Positive for nausea. Negative for abdominal pain, constipation, diarrhea and vomiting.  Genitourinary: Negative for dysuria.  Musculoskeletal: Positive for neck pain. Negative for joint pain.  Neurological: Positive for headaches. Negative for dizziness.   Exam: Physical Exam  Constitutional: He is oriented to person, place, and time.  HENT:  Nose: No mucosal edema.  Mouth/Throat: No oropharyngeal exudate or posterior oropharyngeal edema.  Eyes: Pupils are equal, round, and reactive to light. Conjunctivae, EOM and lids are normal.  Neck: No JVD present. Carotid bruit is not present. No edema present. No thyroid mass and no thyromegaly present.  Cardiovascular: S1 normal and S2 normal. Exam reveals no gallop.  No murmur heard. Pulses:      Dorsalis pedis pulses are 2+ on the right side, and 2+ on the left side.  Respiratory: No respiratory distress. He has decreased breath sounds in the right lower field and the left lower field. He has no wheezes. He has no rhonchi. He has no rales.  GI: Soft. Bowel sounds are normal. There is no tenderness.  Musculoskeletal:       Right ankle: He  exhibits swelling.       Left ankle: He exhibits swelling.  Lymphadenopathy:    He has no cervical adenopathy.  Neurological: He is alert and oriented to person, place, and time. No cranial nerve deficit.  Skin: Skin is warm. No rash noted. Nails show no clubbing.  Psychiatric: He has a normal mood and affect.      Data Reviewed: Basic Metabolic Panel: Recent Labs  Lab 03/09/18 0012 03/09/18 0243 03/10/18 0659 03/11/18 0356 03/11/18 0540  NA 135  --  140 140  --   K 4.6  --  4.4 4.2  --   CL 104  --  106 106  --   CO2 24  --  22 26  --   GLUCOSE 224*  --  122* 132*  --   BUN 66*  --  68* 48*  --   CREATININE 4.91*  --  4.71* 3.68*  --   CALCIUM 8.2*  --  8.2* 8.1*  --   MG  --  2.2  --   --  1.9  PHOS  --  6.0*  --   --  4.6   Liver Function Tests: Recent Labs  Lab 03/09/18 0243  AST 16  ALT 15  ALKPHOS 98  BILITOT 0.4  PROT 6.2*  ALBUMIN 2.9*   CBC: Recent Labs  Lab 03/09/18 0012 03/10/18 0659  WBC 8.3 7.4  HGB 9.0* 8.7*  HCT 28.7* 27.8*  MCV 89.1 90.0  PLT 365 324  Cardiac Enzymes: Recent Labs  Lab 03/09/18 0012 03/09/18 0243 03/09/18 0605  TROPONINI 0.08* 0.08* 0.09*   BNP (last 3 results) Recent Labs    02/24/18 0159 02/25/18 0924 03/09/18 0012  BNP 3,410.0* 2,048.0* 2,611.0*     CBG: Recent Labs  Lab 03/10/18 0858 03/10/18 1218 03/10/18 2107 03/11/18 0724 03/11/18 1140  GLUCAP 124* 156* 185* 124* 106*     Studies: Dg Chest Port 1 View  Result Date: 03/10/2018 CLINICAL DATA:  Central line placement. EXAM: PORTABLE CHEST 1 VIEW COMPARISON:  03/08/2018 and prior exams FINDINGS: A RIGHT IJ central venous catheter is noted with tip overlying the SUPERIOR cavoatrial junction. Cardiomegaly and CABG changes again noted. Mild interstitial prominence again noted. There is no evidence of pneumothorax or large pleural effusion. IMPRESSION: RIGHT IJ central venous catheter placement with tip overlying the SUPERIOR cavoatrial junction. No  evidence of pneumothorax. Cardiomegaly with mild interstitial prominence again noted. Electronically Signed   By: Margarette Canada M.D.   On: 03/10/2018 11:29    Scheduled Meds: . amLODipine  2.5 mg Oral Daily  . atorvastatin  80 mg Oral q1800  . calcium acetate  667 mg Oral TID WC  . clopidogrel  75 mg Oral Daily  . heparin  5,000 Units Subcutaneous Q8H  . insulin aspart  0-15 Units Subcutaneous TID WC  . insulin aspart  0-5 Units Subcutaneous QHS  . insulin aspart  2 Units Subcutaneous TID WC  . insulin glargine  4 Units Subcutaneous Daily  . isosorbide mononitrate  60 mg Oral Daily  . metoprolol succinate  50 mg Oral Daily  . pantoprazole  40 mg Oral Daily  . sodium bicarbonate  650 mg Oral BID  . sodium chloride flush  10-40 mL Intracatheter Q12H  . sodium chloride flush  3 mL Intravenous Q12H  . tamsulosin  0.4 mg Oral QPC supper  . torsemide  40 mg Oral BID  . traZODone  100 mg Oral QHS   Continuous Infusions: . sodium chloride      Assessment/Plan:  1. Acute systolic CHF.  Dialysis to manage fluid.  Patient also on torsemide. 2. 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  Temporary catheter placed in the right neck.  First dialysis session yesterday.  Outpatient dialysis planning.  Will have his graft look at tomorrow by vascular surgery if not will end up needing a PermCath.  Will need 2 more dialysis sessions in the hospital. 3. Type 2 diabetes mellitus with chronic kidney disease.  On low-dose glargine insulin and short acting insulin. 4. Anemia of chronic disease on Epogen 5. Ventral hernia. 6. GERD on PPI 7. Hyperlipidemia unspecified on atorvastatin 8. Hypertension on Norvasc metoprolol  Code Status:     Code Status Orders  (From admission, onward)         Start     Ordered   03/09/18 0234  Full code  Continuous     03/09/18 0233        Code Status History    Date Active Date Inactive Code Status Order ID Comments User Context    02/24/2018 0450 02/25/2018 1419 Full Code [id number]  Amelia Jo, MD Inpatient   07/17/2017 0059 07/18/2017 1826 Full Code [id number]  Amelia Jo, MD Inpatient   06/10/2017 1610 06/14/2017 1308 Full Code [id number]  Henreitta Leber, MD Inpatient   05/25/2017 0024 05/30/2017 1325 Full Code [id number]  Lance Coon, MD Inpatient   11/14/2016 0938 11/14/2016 1442 Full Code [id number]  Dew,  Erskine Squibb, MD Inpatient   10/31/2016 1121 10/31/2016 1817 Full Code [id number]  Algernon Huxley, MD Inpatient   10/18/2016 1357 10/18/2016 1850 Full Code [id number]  Isaias Cowman, MD Inpatient     Family Communication: Permission to speak in front of a large amount of family members at the bedside. Disposition Plan: Potential discharge Tuesday afternoon after third dialysis session and outpatient dialysis slot confirmed and a working dialysis access.  Consultants:  Nephrology  Vascular surgery  Procedures:  Temporary catheter right neck  Time spent: 27 minutes  [place]

## 2018-03-11 NOTE — Progress Notes (Signed)
Subjective: Interval History: has no complaint of pain or bleeding from the right neck.Marland Kitchen He underwent dialysis with his catheter yesterday without issue. He feels good.  Objective: Vital signs in last 24 hours: Temp:  [97.7 F (36.5 C)-98 F (36.7 C)] 98 F (36.7 C) (11/24 0721) Pulse Rate:  [81-92] 83 (11/24 0721) Resp:  [16-19] 18 (11/24 0546) BP: (110-148)/(62-88) 129/68 (11/24 0721) SpO2:  [94 %-98 %] 97 % (11/24 0721) Weight:  [77.1 kg-77.2 kg] 77.2 kg (11/24 0214)  Intake/Output from previous day: 11/23 0701 - 11/24 0700 In: 240 [P.O.:240] Out: 1125 [Urine:625] Intake/Output this shift: No intake/output data recorded.  General appearance: alert, cooperative and appears stated age Head: Normocephalic, without obvious abnormality, atraumatic Neck: right neck temporary cath in place Extremities: extremities normal, atraumatic, no cyanosis or edema  Lab Results: Recent Labs    03/09/18 0012 03/10/18 0659  WBC 8.3 7.4  HGB 9.0* 8.7*  HCT 28.7* 27.8*  PLT 365 324   BMET Recent Labs    03/10/18 0659 03/11/18 0356  NA 140 140  K 4.4 4.2  CL 106 106  CO2 22 26  GLUCOSE 122* 132*  BUN 68* 48*  CREATININE 4.71* 3.68*  CALCIUM 8.2* 8.1*    Studies/Results: Ct Abdomen Pelvis Wo Contrast  Result Date: 03/09/2018 CLINICAL DATA:  Abdominal distension. EXAM: CT ABDOMEN AND PELVIS WITHOUT CONTRAST TECHNIQUE: Multidetector CT imaging of the abdomen and pelvis was performed following the standard protocol without IV contrast. COMPARISON:  CT scan of July 10, 2017. FINDINGS: Lower chest: Mild right pleural effusion is noted. Hepatobiliary: No focal liver abnormality is seen. No gallstones, gallbladder wall thickening, or biliary dilatation. Pancreas: Unremarkable. No pancreatic ductal dilatation or surrounding inflammatory changes. Spleen: Normal in size without focal abnormality. Adrenals/Urinary Tract: Adrenal glands are unremarkable. Stable left renal cyst is noted. No  hydronephrosis or renal obstruction is noted. No renal or ureteral calculi are noted. Urinary bladder is unremarkable. Stomach/Bowel: Stomach is within normal limits. Appendix appears normal. No evidence of bowel wall thickening, distention, or inflammatory changes. Vascular/Lymphatic: Aortic atherosclerosis. No enlarged abdominal or pelvic lymph nodes. Reproductive: Stable mild prostatic enlargement is noted. Other: Multiple fat containing ventral hernias are noted in the periumbilical and supraumbilical regions. Musculoskeletal: No acute or significant osseous findings. IMPRESSION: Mild right pleural effusion. Multiple fat containing ventral hernias are noted in the periumbilical and supraumbilical regions. Stable mild prostatic enlargement. No acute abnormality seen in the abdomen or pelvis. Aortic Atherosclerosis (ICD10-I70.0). Electronically Signed   By: Marijo Conception, M.D.   On: 03/09/2018 13:58   Dg Chest 2 View  Result Date: 03/08/2018 CLINICAL DATA:  72 y/o M; 1 day of shortness of breath and chest pain. EXAM: CHEST - 2 VIEW COMPARISON:  02/24/2018 chest radiograph. FINDINGS: Stable cardiomegaly given projection and technique. 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 Aortic atherosclerosis with calcification. Reticular and peripheral linear opacities of the lungs. No focal consolidation. Blunted posterior costal diaphragmatic angles. No acute osseous abnormality is evident. IMPRESSION: 1. Interstitial pulmonary edema and possible trace effusions. 2. Stable cardiomegaly given projection and technique. Electronically Signed   By: Kristine Garbe M.D.   On: 03/08/2018 23:55   Dg Chest 2 View  Result Date: 02/24/2018 CLINICAL DATA:  Acute onset of shortness of breath. EXAM: CHEST - 2 VIEW COMPARISON:  Chest radiograph performed 02/21/2018 FINDINGS: The lungs are well-aerated. Vascular congestion is noted. Mild peribronchial thickening is noted. There is no evidence of pleural  effusion or pneumothorax.  The heart is borderline enlarged. No acute osseous abnormalities are seen. IMPRESSION: Vascular congestion and borderline cardiomegaly. Mild peribronchial thickening noted. Electronically Signed   By: Garald Balding M.D.   On: 02/24/2018 02:38   Dg Chest 2 View  Result Date: 02/21/2018 CLINICAL DATA:  Shortness of breath. EXAM: CHEST - 2 VIEW COMPARISON:  02/19/2018. 05/24/2017.  CT 06/14/2016. FINDINGS: Prior CABG. Cardiomegaly with mild pulmonary vascular prominence and bilateral interstitial prominence again noted. Mild CHF could present this fashion. Pneumonitis cannot be completely excluded. No acute bony abnormality identified. IMPRESSION: Prior CABG. Cardiomegaly with mild pulmonary vascular prominence and bilateral interstitial prominence again noted. Findings suggest mild CHF. Electronically Signed   By: Marcello Moores  Register   On: 02/21/2018 14:18   Dg Chest 2 View  Result Date: 02/19/2018 CLINICAL DATA:  Difficulty breathing for the past 4 5 days, orthopnea, and cough. History of asthma, previous MI, former smoker. EXAM: CHEST - 2 VIEW COMPARISON:  PA and lateral chest x-ray of July 16, 2017 FINDINGS: The lungs are mildly hyperinflated with hemidiaphragm flattening. The interstitial markings are mildly increased especially in the lower lobes. There is no alveolar infiltrate. The cardiac silhouette is enlarged. The pulmonary vascularity is not engorged. There are post CABG changes. There is no pleural effusion. There is calcification in the wall of the aortic arch. The bony thorax exhibits no acute abnormality. IMPRESSION: Chronic bronchitic changes. No alveolar pneumonia. One cannot exclude low-grade compensated CHF. Electronically Signed   By: David  Martinique M.D.   On: 02/19/2018 15:28   US Renal  Result Date: 03/09/2018 CLINICAL DATA:  Acute kidney injury EXAM: RENAL / URINARY TRACT ULTRASOUND COMPLETE COMPARISON:  CT 07/10/2017.  Ultrasound 05/27/2017 FINDINGS: Right  Kidney: Renal measurements: 9.1 x 4.7 x 5.3 cm = volume: 118 mL. Small cyst in the upper pole measures up to 1 cm. Cortical thinning and increased echotexture. No hydronephrosis. Left Kidney: Renal measurements: 10.4 x 5.6 x 6.1 cm = volume: 184 mL. 2.3 cm cyst in the upper pole. Cortical thinning and increased echotexture diffusely throughout the left kidney. No hydronephrosis. Bladder: Appears normal for degree of bladder distention. IMPRESSION: Increased echotexture and cortical thinning within the kidneys bilaterally compatible with chronic medical renal disease. No hydronephrosis. No acute findings or change since prior ultrasound. Electronically Signed   By: Rolm Baptise M.D.   On: 03/09/2018 09:39   Dg Chest Port 1 View  Result Date: 03/10/2018 CLINICAL DATA:  Central line placement. EXAM: PORTABLE CHEST 1 VIEW COMPARISON:  03/08/2018 and prior exams FINDINGS: A RIGHT IJ central venous catheter is noted with tip overlying the SUPERIOR cavoatrial junction. Cardiomegaly and CABG changes again noted. Mild interstitial prominence again noted. There is no evidence of pneumothorax or large pleural effusion. IMPRESSION: RIGHT IJ central venous catheter placement with tip overlying the SUPERIOR cavoatrial junction. No evidence of pneumothorax. Cardiomegaly with mild interstitial prominence again noted. Electronically Signed   By: Margarette Canada M.D.   On: 03/10/2018 11:29   Dg Abd 2 Views  Result Date: 02/19/2018 CLINICAL DATA:  Abdominal bloating and distension and tenderness over the mid upper abdomen since beginning a new diabetes medication 8 weeks ago. EXAM: ABDOMEN - 2 VIEW COMPARISON:  Abdominal and pelvic CT scan of July 10, 2017 FINDINGS: The bowel gas pattern is within the limits of normal. The stool burden is moderate. There is no small or large bowel obstructive pattern. There are no abnormal soft tissue calcifications. There surgical clips overlying the right aspect of the  a sacrum and at the base  of the right lung medially. The bony structures exhibit no acute abnormalities. IMPRESSION: No evidence of bowel obstruction, ileus, perforation, or constipation. No definite acute intra-abdominal abnormality. Electronically Signed   By: David  Martinique M.D.   On: 02/19/2018 15:31   [place] Ascites (abdomen Limited)  Result Date: 03/09/2018 CLINICAL DATA:  Abdominal distension EXAM: LIMITED ABDOMEN ULTRASOUND FOR ASCITES TECHNIQUE: Limited ultrasound survey for ascites was performed in all four abdominal quadrants. COMPARISON:  None. FINDINGS: Four quadrant survey in the abdomen and pelvis demonstrates no visible ascites. IMPRESSION: No ascites seen. Electronically Signed   By: Rolm Baptise M.D.   On: 03/09/2018 09:37   Anti-infectives: Anti-infectives (From admission, onward)   None      Assessment/Plan: s/p * No surgery found * Patient will undergo an attempt at fistulogram to evaluate the pulsatility in the AVF. His catheter may or may not be able to be removed at the same time. Risks and benefits of the procedure were discussed with him. He understood and wished to proceed.   LOS: 2 days   Juanna Cao 03/11/2018, 10:15 AM

## 2018-03-12 ENCOUNTER — Encounter: Payer: Self-pay | Admitting: Vascular Surgery

## 2018-03-12 ENCOUNTER — Encounter
Admission: EM | Disposition: A | Discharge status: Home / Self Care | Payer: Self-pay | Source: Home / Self Care | Attending: Internal Medicine

## 2018-03-12 DIAGNOSIS — Z992 Dependence on renal dialysis: Secondary | ICD-10-CM

## 2018-03-12 DIAGNOSIS — N186 End stage renal disease: Secondary | ICD-10-CM

## 2018-03-12 DIAGNOSIS — T82898A Other specified complication of vascular prosthetic devices, implants and grafts, initial encounter: Secondary | ICD-10-CM

## 2018-03-12 HISTORY — PX: A/V SHUNT INTERVENTION: CATH118220

## 2018-03-12 LAB — GLUCOSE, CAPILLARY
GLUCOSE-CAPILLARY: 147 mg/dL — AB (ref 70–99)
GLUCOSE-CAPILLARY: 139 mg/dL — AB (ref 70–99)
Glucose-Capillary: 140 mg/dL — ABNORMAL HIGH (ref 70–99)

## 2018-03-12 LAB — BASIC METABOLIC PANEL
Anion gap: 9 (ref 5–15)
BUN: 54 mg/dL — ABNORMAL HIGH (ref 8–23)
CALCIUM: 8.4 mg/dL — AB (ref 8.9–10.3)
CO2: 25 mmol/L (ref 22–32)
CREATININE: 4.19 mg/dL — AB (ref 0.61–1.24)
Chloride: 103 mmol/L (ref 98–111)
GFR, EST AFRICAN AMERICAN: 15 mL/min — AB (ref >60)
GFR, EST NON AFRICAN AMERICAN: 13 mL/min — AB (ref >60)
Glucose, Bld: 145 mg/dL — ABNORMAL HIGH (ref 70–99)
Potassium: 4.6 mmol/L (ref 3.5–5.1)
Sodium: 137 mmol/L (ref 135–145)

## 2018-03-12 LAB — CBC
HCT: 28.1 % — ABNORMAL LOW (ref 39.0–52.0)
Hemoglobin: 8.8 g/dL — ABNORMAL LOW (ref 13.0–17.0)
MCH: 28.4 pg (ref 26.0–34.0)
MCHC: 31.3 g/dL (ref 30.0–36.0)
MCV: 90.6 fL (ref 80.0–100.0)
NRBC: 0 % (ref 0.0–0.2)
PLATELETS: 325 10*3/uL (ref 150–400)
RBC: 3.1 MIL/uL — AB (ref 4.22–5.81)
RDW: 14.6 % (ref 11.5–15.5)
WBC: 7.4 10*3/uL (ref 4.0–10.5)

## 2018-03-12 LAB — MAGNESIUM: Magnesium: 2 mg/dL (ref 1.7–2.4)

## 2018-03-12 LAB — CALCIUM, IONIZED: Calcium, Ionized, Serum: 4.8 mg/dL (ref 4.5–5.6)

## 2018-03-12 LAB — HEPATITIS B SURFACE ANTIGEN: HEP B S AG: NEGATIVE

## 2018-03-12 SURGERY — A/V SHUNT INTERVENTION
Anesthesia: Moderate Sedation | Laterality: Left

## 2018-03-12 MED ORDER — LOSARTAN POTASSIUM 25 MG PO TABS
25.0000 mg | ORAL_TABLET | Freq: Every day | ORAL | Status: DC
  Administered 2018-03-12: 25 mg via ORAL
  Filled 2018-03-12: qty 1

## 2018-03-12 MED ORDER — LIDOCAINE-EPINEPHRINE (PF) 1 %-1:200000 IJ SOLN
INTRAMUSCULAR | Status: AC
  Filled 2018-03-12: qty 10

## 2018-03-12 MED ORDER — HEPARIN SODIUM (PORCINE) 1000 UNIT/ML IJ SOLN
INTRAMUSCULAR | Status: DC | PRN
  Administered 2018-03-12: 3000 [IU] via INTRAVENOUS

## 2018-03-12 MED ORDER — MIDAZOLAM HCL 2 MG/2ML IJ SOLN
INTRAMUSCULAR | Status: DC | PRN
  Administered 2018-03-12: 2 mg via INTRAVENOUS

## 2018-03-12 MED ORDER — FENTANYL CITRATE (PF) 100 MCG/2ML IJ SOLN
INTRAMUSCULAR | Status: AC
  Filled 2018-03-12: qty 2

## 2018-03-12 MED ORDER — IOPAMIDOL (ISOVUE-300) INJECTION 61%
INTRAVENOUS | Status: DC | PRN
  Administered 2018-03-12: 15 mL via INTRAVENOUS

## 2018-03-12 MED ORDER — HEPARIN SODIUM (PORCINE) 10000 UNIT/ML IJ SOLN
INTRAMUSCULAR | Status: AC
  Filled 2018-03-12: qty 1

## 2018-03-12 MED ORDER — HEPARIN SODIUM (PORCINE) 1000 UNIT/ML IJ SOLN
INTRAMUSCULAR | Status: AC
  Filled 2018-03-12: qty 1

## 2018-03-12 MED ORDER — HEPARIN (PORCINE) IN NACL 1000-0.9 UT/500ML-% IV SOLN
INTRAVENOUS | Status: AC
  Filled 2018-03-12: qty 500

## 2018-03-12 MED ORDER — MIDAZOLAM HCL 5 MG/5ML IJ SOLN
INTRAMUSCULAR | Status: AC
  Filled 2018-03-12: qty 5

## 2018-03-12 MED ORDER — FENTANYL CITRATE (PF) 100 MCG/2ML IJ SOLN
INTRAMUSCULAR | Status: DC | PRN
  Administered 2018-03-12: 50 ug via INTRAVENOUS

## 2018-03-12 MED ORDER — CEFAZOLIN SODIUM-DEXTROSE 1-4 GM/50ML-% IV SOLN
1.0000 g | Freq: Once | INTRAVENOUS | Status: AC
  Administered 2018-03-12: 1 g via INTRAVENOUS
  Filled 2018-03-12: qty 50

## 2018-03-12 SURGICAL SUPPLY — 10 items
CANNULA 5F STIFF (CANNULA) ×2 IMPLANT
CATH PALIN MAXID VT KIT 19CM (CATHETERS) ×2 IMPLANT
DERMABOND ADVANCED (GAUZE/BANDAGES/DRESSINGS) ×1
DERMABOND ADVANCED .7 DNX12 (GAUZE/BANDAGES/DRESSINGS) ×1 IMPLANT
DRAPE BRACHIAL (DRAPES) ×2 IMPLANT
GUIDEWIRE SUPER STIFF .035X180 (WIRE) ×2 IMPLANT
PACK ANGIOGRAPHY (CUSTOM PROCEDURE TRAY) ×2 IMPLANT
SHEATH BRITE TIP 6FRX5.5 (SHEATH) ×2 IMPLANT
SUT MNCRL AB 4-0 PS2 18 (SUTURE) ×4 IMPLANT
SUT PROLENE 0 CT 1 30 (SUTURE) ×4 IMPLANT

## 2018-03-12 NOTE — Progress Notes (Signed)
Post HD assessment    03/12/18 1844  Neurological  Level of Consciousness Alert  Orientation Level Oriented X4  Respiratory  Respiratory Pattern Regular;Unlabored  Chest Assessment Chest expansion symmetrical  Cardiac  Pulse Irregular  ECG Monitor Yes  Cardiac Rhythm NSR  Ectopy Couplet PVC's;Other (Comment) (missed beats )  Vascular  R Radial Pulse +2  L Radial Pulse +2  Edema Generalized;Right lower extremity;Left lower extremity  Integumentary  Integumentary (WDL) X  Skin Color Appropriate for ethnicity  Musculoskeletal  Musculoskeletal (WDL) X  Generalized Weakness Yes  Assistive Device None  GU Assessment  Genitourinary (WDL) X  Genitourinary Symptoms  (HD)  Psychosocial  Psychosocial (WDL) WDL

## 2018-03-12 NOTE — H&P (Signed)
[facility] History & Physical Update  The patient was interviewed and re-examined.  The patient's previous History and Physical has been reviewed and is unchanged.  There is no change in the plan of care. We plan to proceed with the scheduled procedure.  Leotis Pain, MD  03/12/2018, 11:05 AM

## 2018-03-12 NOTE — Progress Notes (Signed)
CCMD reports 9 beat run v-tach. Pt asymptomatic no distress noted. MD Burlene Arnt made aware. Magnesium added to morning labs. Will continue to monitor.

## 2018-03-12 NOTE — Progress Notes (Signed)
HD tx end    03/12/18 1837  Vital Signs  Pulse Rate 89  Pulse Rate Source Monitor  Resp 13  BP (!) 145/82  BP Location Right Wrist  BP Method Automatic  Patient Position (if appropriate) Lying  Oxygen Therapy  SpO2 97 %  O2 Device Room Air  During Hemodialysis Assessment  Dialysis Fluid Bolus Normal Saline  Bolus Amount (mL) 250 mL  Intra-Hemodialysis Comments Tx completed

## 2018-03-12 NOTE — Evaluation (Addendum)
Physical Therapy Evaluation Patient Details Name: Thomas [name] MRN: [id number] DOB: 1946-01-27 Today's Date: 03/12/2018   History of Present Illness  presented to ER secondary to progressive SOB, LE edema; admitted for management of CHF exacerbation.  Noted with mild increase in troponin; attributed to demand ischemia per cardiology.  Clinical Impression  Upon evaluation, patient alert and oriented; follows commands and demonstrates good insight/safety awareness.  Bilat UE/LE strength and ROM grossly symmetrical and WFL; no focal weakness appreciated.  2-3+ pitting edema noted bilat LEs; however, patient reports this is markedly improved since admission.  Demonstrates ability to complete bed mobility with mod indep; sit/stand, basic transfers and gait (200') without assist device, mod indep.  Good safety/stability and overall gait mechanics; completing 10' walk time in 7-8 seconds.  Of note, vitals stable and WFL on RA throughout session (HR 80-90s, SaO2 >92%). Patient comfortable with currently mobility status and feels mobility is at/near baseline for him.  Will complete order at this time; no acute PT needs identified at this time.  Did encouraged continued mobility around the unit throughout remaining hospitalization.  PAtient voiced agreement and understanding.  *Per telephone clarification with Dr. Candiss Norse (nephrology), patient okay for OOB activities with R IJ temp dialysis catheter in place.    Follow Up Recommendations No PT follow up    Equipment Recommendations       Recommendations for Other Services       Precautions / Restrictions Precautions Precautions: Fall Precaution Comments: No BP L UE, R IJ temp dialysis cath (cleared for OOB activity per Dr. Candiss Norse) Restrictions Weight Bearing Restrictions: No      Mobility  Bed Mobility Overal bed mobility: Independent                Transfers Overall transfer level: Modified independent Equipment used: None                 Ambulation/Gait Ambulation/Gait assistance: Supervision;Modified independent (Device/Increase time) Gait Distance (Feet): 200 Feet     Gait velocity: 10' walk time, 7-8 seconds.   General Gait Details: reciprocal stepping pattern with good step height/length; good cadence and overall control.  Able to complete head turns, start/stop without buckling or LOB.  Stairs            Wheelchair Mobility    Modified Rankin (Stroke Patients Only)       Balance Overall balance assessment: Needs assistance Sitting-balance support: No upper extremity supported;Feet supported Sitting balance-Leahy Scale: Good     Standing balance support: Bilateral upper extremity supported Standing balance-Leahy Scale: Good                               Pertinent Vitals/Pain Pain Assessment: No/denies pain    Home Living Family/patient expects to be discharged to:: Private residence Living Arrangements: Spouse/significant other Available Help at Discharge: Family;Available 24 hours/day Type of Home: House Home Access: Level entry     Home Layout: One level   Additional Comments: does have two steps to access sunken living room in home environment    Prior Function Level of Independence: Independent         Comments: Indep with ADLs, household and community mobilization without assist device     Hand Dominance        Extremity/Trunk Assessment   Upper Extremity Assessment Upper Extremity Assessment: Overall WFL for tasks assessed    Lower Extremity Assessment Lower Extremity Assessment: Overall WFL for  tasks assessed(grossly at least 4+/5; 2-3+ pitting edema noted bilat LEs (though patient reports marked improvement since admission))       Communication   Communication: No difficulties  Cognition Arousal/Alertness: Awake/alert Behavior During Therapy: WFL for tasks assessed/performed Overall Cognitive Status: Within Functional Limits for tasks  assessed                                        General Comments      Exercises     Assessment/Plan    PT Assessment Patent does not need any further PT services  PT Problem List         PT Treatment Interventions Gait training;Functional mobility training;Therapeutic activities;Balance training;Patient/family education    PT Goals (Current goals can be found in the Care Plan section)  Acute Rehab PT Goals PT Goal Formulation: All assessment and education complete, DC therapy Time For Goal Achievement: 03/12/18 Potential to Achieve Goals: Good    Frequency     Barriers to discharge        Co-evaluation               AM-PAC PT "6 Clicks" Mobility  Outcome Measure Help needed turning from your back to your side while in a flat bed without using bedrails?: None Help needed moving from lying on your back to sitting on the side of a flat bed without using bedrails?: None Help needed moving to and from a bed to a chair (including a wheelchair)?: None Help needed standing up from a chair using your arms (e.g., wheelchair or bedside chair)?: None Help needed to walk in hospital room?: None Help needed climbing 3-5 steps with a railing? : None 6 Click Score: 24    End of Session Equipment Utilized During Treatment: Gait belt Activity Tolerance: Patient tolerated treatment well Patient left: in bed;with call bell/phone within reach(seated edge of bed, patient declining alarm; CNA informed/aware) Nurse Communication: Mobility status PT Visit Diagnosis: Muscle weakness (generalized) (M62.81);Difficulty in walking, not elsewhere classified (R26.2)    Time: 7014-1030 PT Time Calculation (min) (ACUTE ONLY): 15 min   Charges:   PT Evaluation $PT Eval Low Complexity: 1 Low          Huxley Vanwagoner H. Owens Shark, PT, DPT, NCS 03/12/18, 10:58 AM [phone]

## 2018-03-12 NOTE — Progress Notes (Signed)
Pre HD Treatment    03/12/18 1600  Vital Signs  Temp 98.4 F (36.9 C)  Temp Source Oral  Pulse Rate 83  Pulse Rate Source Monitor  Resp 13  BP 129/77  BP Location Right Arm  BP Method Automatic  Patient Position (if appropriate) Lying  Oxygen Therapy  SpO2 98 %  O2 Device Room Air  Pain Assessment  Pain Scale 0-10  Pain Score 5  Pain Type Surgical pain  Pain Location Neck  Pain Orientation Right  Pain Intervention(s) RN made aware  Dialysis Weight  Weight 78.9 kg  Type of Weight Pre-Dialysis  Time-Out for Hemodialysis  What Procedure? HD  Pt Identifiers(min of two) First/Last Name;MRN/Account#;Pt's DOB(use if MRN/Acct# not available  Correct Site? Yes  Correct Side? Yes  Correct Procedure? Yes  Consents Verified? Yes  Rad Studies Available? N/A  Safety Precautions Reviewed? Yes  Engineer, civil (consulting) Number 2  Station Number 4  UF/Alarm Test Passed  Conductivity: Meter 14  Conductivity: Machine  14.1  pH 7.4  Reverse Osmosis Main  Normal Saline Lot Number [id number]  Dialyzer Lot Number [id number]  Disposable Set Lot Number [id number]  Machine Temperature 98.6 F (37 C)  Musician and Audible Yes  Blood Lines Intact and Secured Yes  Pre Treatment Patient Checks  Vascular access used during treatment Catheter  Hepatitis B Surface Antigen Results Negative  Date Hepatitis B Surface Antigen Drawn 03/09/18  Hepatitis B Surface Antibody  (<10)  Date Hepatitis B Surface Antibody Drawn 03/09/18  Hemodialysis Consent Verified Yes  Hemodialysis Standing Orders Initiated Yes  ECG (Telemetry) Monitor On Yes  Prime Ordered Normal Saline  Length of  DialysisTreatment -hour(s) 2.5 Hour(s)  Dialysis Treatment Comments Na 140  Dialyzer Elisio 17H NR  Dialysate 3K, 2.5 Ca  Dialysis Anticoagulant None  Dialysate Flow Ordered 500  Blood Flow Rate Ordered 250 mL/min  Ultrafiltration Goal 1 Liters  Dialysis Blood Pressure Support Ordered Normal Saline  Education /  Care Plan  Dialysis Education Provided Yes  Documented Education in Care Plan Yes  Fistula / Graft Left Other (Comment) Arteriovenous fistula  No Placement Date or Time found.   Placed prior to admission: Yes  Orientation: Left  Access Location: Other (Comment)  Access Type: Arteriovenous fistula  Site Condition No complications  Fistula / Graft Assessment Present;Thrill;Bruit  Drainage Description None  Hemodialysis Catheter Right Subclavian Double-lumen  Placement Date/Time: 03/12/18 1400   Placed prior to admission: No  Orientation: Right  Access Location: Subclavian  Hemodialysis Catheter Type: Double-lumen  Site Condition No complications  Blue Lumen Status Capped (Central line)  Red Lumen Status Capped (Central line)  Dressing Type Gauze/Drain sponge  Dressing Status Clean;Dry;Intact  Interventions New dressing  Dressing Change Due 03/13/18

## 2018-03-12 NOTE — Progress Notes (Signed)
HD Pre Assessment    03/12/18 1555  Neurological  Level of Consciousness Alert  Orientation Level Oriented X4  Respiratory  Respiratory Pattern Regular;Unlabored;Symmetrical  Chest Assessment Chest expansion symmetrical  Bilateral Breath Sounds Clear  Cardiac  Pulse Regular  Heart Sounds S1, S2  Jugular Venous Distention (JVD) Yes  ECG Monitor Yes  Cardiac Rhythm NSR  Heart Block Type 1st degree AVB  Ectopy Unifocal PVC's  Ectopy Frequency Frequent  Vascular  R Radial Pulse +2  L Radial Pulse +2  Edema Right lower extremity;Left lower extremity  RLE Edema +2  LLE Edema +2  Integumentary  Integumentary (WDL) X  Skin Color Appropriate for ethnicity  Skin Condition Dry  Skin Integrity Ecchymosis  Ecchymosis Location Arm;Neck  Musculoskeletal  Musculoskeletal (WDL) X  Generalized Weakness Yes  GU Assessment  Genitourinary (WDL) X (HD pt)  Psychosocial  Psychosocial (WDL) WDL

## 2018-03-12 NOTE — Progress Notes (Signed)
Patient ID: Thomas [name], male   DOB: 08/19/1945, 72 y.o.   MRN: [id number]  [facility] PROGRESS NOTE  Thomas [name] WYO:[id number] DOB: [birth year] [name]: 03/08/2018 PCP: Birdie Sons, MD  HPI/Subjective: Patient with some neck pain because of the catheter.  Had some headache.  Also had some nausea today.  Objective: Vitals:   03/12/18 1630 03/12/18 1645  BP: 131/70 (!) 132/103  Pulse: 80 80  Resp: 12 12  Temp:    SpO2: 98% 95%    Filed Weights   03/11/18 0214 03/12/18 0355 03/12/18 1600  Weight: 77.2 kg 77.8 kg 78.9 kg    ROS: Review of Systems  Constitutional: Negative for chills and fever.  Eyes: Negative for blurred vision.  Respiratory: Negative for cough and shortness of breath.   Cardiovascular: Negative for chest pain.  Gastrointestinal: Positive for nausea. Negative for abdominal pain, constipation, diarrhea and vomiting.  Genitourinary: Negative for dysuria.  Musculoskeletal: Positive for neck pain. Negative for joint pain.  Neurological: Positive for headaches. Negative for dizziness.   Exam: Physical Exam  Constitutional: He is oriented to person, place, and time.  HENT:  Nose: No mucosal edema.  Mouth/Throat: No oropharyngeal exudate or posterior oropharyngeal edema.  Eyes: Pupils are equal, round, and reactive to light. Conjunctivae, EOM and lids are normal.  Neck: No JVD present. Carotid bruit is not present. No edema present. No thyroid mass and no thyromegaly present.  Cardiovascular: S1 normal and S2 normal. Exam reveals no gallop.  No murmur heard. Pulses:      Dorsalis pedis pulses are 2+ on the right side, and 2+ on the left side.  Respiratory: No respiratory distress. He has no wheezes. He has no rhonchi. He has no rales.  Diminished breath sounds in the lung bases bilaterally  GI: Soft. Bowel sounds are normal. There is no tenderness.  Musculoskeletal:       Right ankle: He exhibits no swelling.  +LE edema in the feet bilaterally   Lymphadenopathy:    He has no cervical adenopathy.  Neurological: He is alert and oriented to person, place, and time. No cranial nerve deficit.  Skin: Skin is warm. No rash noted. Nails show no clubbing.  Psychiatric: He has a normal mood and affect.      Data Reviewed: Basic Metabolic Panel: Recent Labs  Lab 03/09/18 0012 03/09/18 0243 03/10/18 0659 03/11/18 0356 03/11/18 0540 03/12/18 0416  NA 135  --  140 140  --  137  K 4.6  --  4.4 4.2  --  4.6  CL 104  --  106 106  --  103  CO2 24  --  22 26  --  25  GLUCOSE 224*  --  122* 132*  --  145*  BUN 66*  --  68* 48*  --  54*  CREATININE 4.91*  --  4.71* 3.68*  --  4.19*  CALCIUM 8.2*  --  8.2* 8.1*  --  8.4*  MG  --  2.2  --   --  1.9 2.0  PHOS  --  6.0*  --   --  4.6  --    Liver Function Tests: Recent Labs  Lab 03/09/18 0243  AST 16  ALT 15  ALKPHOS 98  BILITOT 0.4  PROT 6.2*  ALBUMIN 2.9*   CBC: Recent Labs  Lab 03/09/18 0012 03/10/18 0659 03/12/18 0416  WBC 8.3 7.4 7.4  HGB 9.0* 8.7* 8.8*  HCT 28.7* 27.8* 28.1*  MCV 89.1  90.0 90.6  PLT 365 324 325   Cardiac Enzymes: Recent Labs  Lab 03/09/18 0012 03/09/18 0243 03/09/18 0605  TROPONINI 0.08* 0.08* 0.09*   BNP (last 3 results) Recent Labs    02/24/18 0159 02/25/18 0924 03/09/18 0012  BNP 3,410.0* 2,048.0* 2,611.0*     CBG: Recent Labs  Lab 03/11/18 1140 03/11/18 1659 03/11/18 2100 03/12/18 0820 03/12/18 1259  GLUCAP 106* 184* 182* 147* 139*     Studies: No results found.  Scheduled Meds: . amLODipine  2.5 mg Oral Daily  . atorvastatin  80 mg Oral q1800  . calcium acetate  667 mg Oral TID WC  . clopidogrel  75 mg Oral Daily  . heparin  5,000 Units Subcutaneous Q8H  . insulin aspart  0-15 Units Subcutaneous TID WC  . insulin aspart  0-5 Units Subcutaneous QHS  . insulin aspart  2 Units Subcutaneous TID WC  . insulin glargine  4 Units Subcutaneous Daily  . isosorbide mononitrate  60 mg Oral Daily  . metoprolol succinate   50 mg Oral Daily  . pantoprazole  40 mg Oral Daily  . sodium bicarbonate  650 mg Oral BID  . sodium chloride flush  10-40 mL Intracatheter Q12H  . sodium chloride flush  3 mL Intravenous Q12H  . tamsulosin  0.4 mg Oral QPC supper  . torsemide  40 mg Oral BID  . traZODone  100 mg Oral QHS   Continuous Infusions: . sodium chloride      Assessment/Plan:  1. Acute systolic CHF- continue torsemide. Fluid management with HD per nephrology. 2. 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  Fistulogram today without any stenosis. Has tunneled HD cath in place. Plan for HD today, tomorrow, and again on Friday. Patient will be MWF HD. 3. Type 2 diabetes mellitus with chronic kidney disease.  On low-dose glargine insulin and short acting insulin. 4. Anemia of chronic disease on Epogen 5. Ventral hernia- stable. 6. GERD on PPI 7. Hyperlipidemia unspecified on atorvastatin 8. Hypertension on norvasc and metoprolol  Code Status:     Code Status Orders  (From admission, onward)         Start     Ordered   03/09/18 0234  Full code  Continuous     03/09/18 0233        Code Status History    Date Active Date Inactive Code Status Order ID Comments User Context   02/24/2018 0450 02/25/2018 1419 Full Code [id number]  Amelia Jo, MD Inpatient   07/17/2017 0059 07/18/2017 1826 Full Code [id number]  Amelia Jo, MD Inpatient   06/10/2017 1610 06/14/2017 1308 Full Code [id number]  Henreitta Leber, MD Inpatient   05/25/2017 0024 05/30/2017 1325 Full Code [id number]  Lance Coon, MD Inpatient   11/14/2016 0938 11/14/2016 1442 Full Code [id number]  Algernon Huxley, MD Inpatient   10/31/2016 1121 10/31/2016 1817 Full Code [id number]  Algernon Huxley, MD Inpatient   10/18/2016 1357 10/18/2016 1850 Full Code [id number]  Isaias Cowman, MD Inpatient     Family Communication: Family members at bedside Disposition Plan: Plan for discharge Tuesday afternoon after  HD  Consultants:  Nephrology  Vascular surgery  Procedures:  Temporary catheter right neck  Time spent: 35 minutes  [place]

## 2018-03-12 NOTE — Progress Notes (Signed)
[facility]  ROUNDING NOTE   Subjective:   Overall feels well Edema has improved States that he was able to sleep well last night without feeling shortness of breath Multiple family members at bedside  Objective:  Vital signs in last 24 hours:  Temp:  [97.6 F (36.4 C)-98.7 F (37.1 C)] 98.4 F (36.9 C) (11/25 1600) Pulse Rate:  [77-92] 77 (11/25 1700) Resp:  [11-18] 11 (11/25 1700) BP: (115-166)/(60-103) 124/66 (11/25 1700) SpO2:  [90 %-99 %] 92 % (11/25 1700) Weight:  [77.8 kg-78.9 kg] 78.9 kg (11/25 1600)  Weight change: 0.68 kg Filed Weights   03/11/18 0214 03/12/18 0355 03/12/18 1600  Weight: 77.2 kg 77.8 kg 78.9 kg    Intake/Output: I/O last 3 completed shifts: In: 35 [P.O.:720] Out: 2095 [Urine:2095]   Intake/Output this shift:  Total I/O In: 0  Out: 300 [Urine:300]  Physical Exam: General: NAD,   Head: Normocephalic, atraumatic. Moist oral mucosal membranes  Eyes: Anicteric,  Neck: Supple,   Lungs:   Mild crackles  Heart: Regular rate and rhythm  Abdomen:  Soft, nontender,   Extremities:  1+ peripheral edema.  Neurologic: Nonfocal, moving all four extremities  Skin: No lesions  Access: Left AVF +bruit, +thrill    Basic Metabolic Panel: Recent Labs  Lab 03/09/18 0012 03/09/18 0243 03/10/18 0659 03/11/18 0356 03/11/18 0540 03/12/18 0416  NA 135  --  140 140  --  137  K 4.6  --  4.4 4.2  --  4.6  CL 104  --  106 106  --  103  CO2 24  --  22 26  --  25  GLUCOSE 224*  --  122* 132*  --  145*  BUN 66*  --  68* 48*  --  54*  CREATININE 4.91*  --  4.71* 3.68*  --  4.19*  CALCIUM 8.2*  --  8.2* 8.1*  --  8.4*  MG  --  2.2  --   --  1.9 2.0  PHOS  --  6.0*  --   --  4.6  --     Liver Function Tests: Recent Labs  Lab 03/09/18 0243  AST 16  ALT 15  ALKPHOS 98  BILITOT 0.4  PROT 6.2*  ALBUMIN 2.9*   No results for input(s): LIPASE, AMYLASE in the last 168 hours. No results for input(s): AMMONIA in the last 168  hours.  CBC: Recent Labs  Lab 03/09/18 0012 03/10/18 0659 03/12/18 0416  WBC 8.3 7.4 7.4  HGB 9.0* 8.7* 8.8*  HCT 28.7* 27.8* 28.1*  MCV 89.1 90.0 90.6  PLT 365 324 325    Cardiac Enzymes: Recent Labs  Lab 03/09/18 0012 03/09/18 0243 03/09/18 0605  TROPONINI 0.08* 0.08* 0.09*    BNP: Invalid input(s): POCBNP  CBG: Recent Labs  Lab 03/11/18 1140 03/11/18 1659 03/11/18 2100 03/12/18 0820 03/12/18 1259  GLUCAP 106* 184* 182* 147* 139*    Microbiology: Results for orders placed or performed in visit on 07/03/17  Urine Culture     Status: None   Collection Time: 07/03/17 12:14 PM  Result Value Ref Range Status   Urine Culture, Routine Final report  Final   Organism ID, Bacteria Comment  Final    Comment: Mixed urogenital flora Less than 10,000 colonies/mL     Coagulation Studies: No results for input(s): LABPROT, INR in the last 72 hours.  Urinalysis: No results for input(s): COLORURINE, LABSPEC, PHURINE, GLUCOSEU, HGBUR, BILIRUBINUR, KETONESUR, PROTEINUR, UROBILINOGEN, NITRITE, LEUKOCYTESUR in  the last 72 hours.  Invalid input(s): APPERANCEUR    Imaging: No results found.   Medications:   . sodium chloride     . amLODipine  2.5 mg Oral Daily  . atorvastatin  80 mg Oral q1800  . calcium acetate  667 mg Oral TID WC  . clopidogrel  75 mg Oral Daily  . heparin  5,000 Units Subcutaneous Q8H  . insulin aspart  0-15 Units Subcutaneous TID WC  . insulin aspart  0-5 Units Subcutaneous QHS  . insulin aspart  2 Units Subcutaneous TID WC  . insulin glargine  4 Units Subcutaneous Daily  . isosorbide mononitrate  60 mg Oral Daily  . metoprolol succinate  50 mg Oral Daily  . pantoprazole  40 mg Oral Daily  . sodium bicarbonate  650 mg Oral BID  . sodium chloride flush  10-40 mL Intracatheter Q12H  . sodium chloride flush  3 mL Intravenous Q12H  . tamsulosin  0.4 mg Oral QPC supper  . torsemide  40 mg Oral BID  . traZODone  100 mg Oral QHS   sodium  chloride, acetaminophen, bisacodyl, morphine injection, nitroGLYCERIN, ondansetron (ZOFRAN) IV, senna-docusate, sodium chloride flush  Assessment/ Plan:  [name] is a 72 y.o. white male with diabetes, hypertension, severe coronary disease, CABG 1996, peripheral arterial disease, congestive heart failure.   1. End stage renal disease. First hemodialysis was 11/23 through RIJ temp catheter. AVF was attempted on 11/22 however it infiltrated.  It is being rested now.  Angiogram on November 25 did not show any stenosis. 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  Second hemodialysis treatment today via PermCath -Next hemodialysis on Tuesday, then start outpatient on Friday - Outpatient planning for Thomas [name]; MWF second shift assigned to patient  2. Hypertension and Acute exacerbation of chronic systolic congestive heart failure:   - ultrafiltration with dialysis.  -Discontinue amlodipine in favor of low-dose losartan  3. Diabetes mellitus type II with chronic kidney disease: history of poor control. Hemoglobin A1c 9.3% on 01/24/18.   4. Anemia of chronic kidney disease: hemoglobin 8.8 - EPO with HD treatments   LOS: 3 Thomas [name] 11/25/20195:15 PM

## 2018-03-12 NOTE — Progress Notes (Signed)
HD Treatment Initiated    03/12/18 1603  During Hemodialysis Assessment  Blood Flow Rate (mL/min) 250 mL/min  Arterial Pressure (mmHg) -80 mmHg  Venous Pressure (mmHg) 80 mmHg  Transmembrane Pressure (mmHg) 60 mmHg  Ultrafiltration Rate (mL/min) 610 mL/min  Dialysate Flow Rate (mL/min) 500 ml/min  Conductivity: Machine  14.2  HD Safety Checks Performed Yes  Dialysis Fluid Bolus Normal Saline  Bolus Amount (mL) 250 mL  Intra-Hemodialysis Comments Tx initiated  Note  Observations Perm Cath accessed  Hemodialysis Catheter Right Subclavian Double-lumen  Placement Date/Time: 03/12/18 1400   Placed prior to admission: No  Orientation: Right  Access Location: Subclavian  Hemodialysis Catheter Type: Double-lumen  Blue Lumen Status Infusing  Red Lumen Status Infusing  Purple Lumen Status N/A

## 2018-03-12 NOTE — Progress Notes (Signed)
Post HD assessment. Pt tolerated tx well without c/o or complication. Net UF 1018, goal met.    03/12/18 1847  Vital Signs  Temp 97.9 F (36.6 C)  Temp Source Oral  Pulse Rate 88  Pulse Rate Source Monitor  Resp 13  BP (!) 151/93  BP Location Right Wrist  BP Method Automatic  Patient Position (if appropriate) Lying  Oxygen Therapy  SpO2 95 %  O2 Device Room Air  Dialysis Weight  Weight 77.7 kg  Type of Weight Post-Dialysis  Post-Hemodialysis Assessment  Rinseback Volume (mL) 250 mL  KECN 37.3 V  Dialyzer Clearance Lightly streaked  Duration of HD Treatment -hour(s) 2.5 hour(s)  Hemodialysis Intake (mL) 500 mL  UF Total -Machine (mL) 1518 mL  Net UF (mL) 1018 mL  Tolerated HD Treatment Yes  Education / Care Plan  Dialysis Education Provided Yes  Fistula / Graft Left Other (Comment) Arteriovenous fistula  No Placement Date or Time found.   Placed prior to admission: Yes  Orientation: Left  Access Location: Other (Comment)  Access Type: Arteriovenous fistula  Site Condition Other (Comment) (brusing present )  Hemodialysis Catheter Right Subclavian Double-lumen  Placement Date/Time: 03/12/18 1400   Placed prior to admission: No  Orientation: Right  Access Location: Subclavian  Hemodialysis Catheter Type: Double-lumen  Site Condition Painful;Other (Comment) (brusing present )  Blue Lumen Status Heparin locked  Red Lumen Status Heparin locked  Purple Lumen Status N/A  Catheter fill solution Heparin 1000 units/ml  Catheter fill volume (Arterial) 1.6 cc  Catheter fill volume (Venous) 1.7  Dressing Type Gauze/Drain sponge  Dressing Status Clean;Dry;Intact  Drainage Description None  Post treatment catheter status Capped and Clamped

## 2018-03-12 NOTE — Care Management Important Message (Signed)
Copy of signed IM left with patient in room.  

## 2018-03-12 NOTE — Care Management (Signed)
Spoke with Estill Bamberg.  Patient has a chair time for out patient HD on Friday at 12:15 at [facility] in [place].  Would need HD on Wednesday prior to discharge.

## 2018-03-12 NOTE — Op Note (Signed)
[facility]    OPERATIVE NOTE   PROCEDURE: 1.   Left brachiocephalic arteriovenous fistula cannulation under ultrasound guidance 2.   Left arm fistulagram including central venogram 3.   Placement of a 19 cm tip to cuff tunneled hemodialysis catheter in the right jugular vein with fluoroscopic guidance  PRE-OPERATIVE DIAGNOSIS: 1. ESRD 2. Poorly functional left brachiocephalic AVF marked infiltration on its initial use  POST-OPERATIVE DIAGNOSIS: same as above   SURGEON: Thomas Pain, MD  ANESTHESIA: local with MCS  ESTIMATED BLOOD LOSS: 5 cc  FINDING(S): 1.  reasonably large, widely patent left brachiocephalic AV fistula with some tortuosity but no significant stenoses.  Central venous circulation was patent.  SPECIMEN(S):  None  CONTRAST: 15 cc  FLUORO TIME: 0.2 minutes  MODERATE CONSCIOUS SEDATION TIME: Approximately 20 minutes with 2 mg of Versed and 50 mcg of Fentanyl   INDICATIONS: Thomas [name] is a 72 y.o. male who presents with malfunctioning left brachiocephalic arteriovenous fistula. He had a large infiltration with its first use. The patient is scheduled for left fistulagram.  The patient is aware the risks include but are not limited to: bleeding, infection, thrombosis of the cannulated access, and possible anaphylactic reaction to the contrast.  The patient is aware of the risks of the procedure and elects to proceed forward.  DESCRIPTION: After full informed written consent was obtained, the patient was brought back to the angiography suite and placed supine upon the angiography table.  The patient was connected to monitoring equipment. Moderate conscious sedation was administered with a face to face encounter with the patient throughout the procedure with my supervision of the RN administering medicines and monitoring the patient's vital signs and mental status throughout from the start of the procedure until the patient was taken to the  recovery room. The left arm was prepped and draped in the standard fashion for a percutaneous access intervention.  Under ultrasound guidance, the left brachiocephalic arteriovenous fistula was cannulated with a micropuncture needle under direct ultrasound guidance where it was patent and a permanent image was performed.  The microwire was advanced into the fistula and the needle was exchanged for the a microsheath.  I then upsized to a 6 Fr Sheath and imaging was performed.  Hand injections were completed to image the access including the central venous system. This demonstrated reasonably large, widely patent left brachiocephalic AV fistula with some tortuosity but no significant stenoses.  Central venous circulation was patent.  Based on the images, this patient will need no intervention to the fistula, but with the infiltration he will need a permcath until the bruising subsides.  A 4-0 Monocryl purse-string suture was sewn around the sheath.  The sheath was removed while tying down the suture.  A sterile bandage was applied to the puncture site. I then turned my attention to the placement of the permacath.  His temporary catheter in the right neck was prepped and draped as well as the right neck and chest.  An Amplatz Super Stiff wire was placed through the existing catheter and the existing temporary catheter was removed.  A peel-away sheath was then placed over the wire.  A counterincision was created below the right clavicle and we tunneled from the subclavicular incision to the access site.  Using fluoroscopic guidance, a 19 cm tip to cuff tunneled hemodialysis catheter was selected and pulled through the tunnel and then placed through the peel-away sheath and the peel-away sheath was removed.  The catheter tips  were parked in the right atrium.  It withdrew blood well and flushed easily with sterile saline and a concentrated heparin solution was placed.  2-0 Prolene sutures were used to secure the catheter  to the chest wall.  A 4-0 Monocryl pursestring suture was used at the exit site and a 4-0 Monocryl pursestring suture was used to close the access site at the jugular entrance.  Sterile dressings were placed.  The patient tolerated the procedure well and was taken to the recovery room in stable condition.  COMPLICATIONS: None  CONDITION: Stable   Thomas [name]  03/12/2018 11:54 AM   This note was created with Dragon Medical transcription system. Any errors in dictation are purely unintentional.

## 2018-03-13 ENCOUNTER — Telehealth: Payer: Self-pay

## 2018-03-13 ENCOUNTER — Inpatient Hospital Stay: Payer: Medicare Other

## 2018-03-13 LAB — BASIC METABOLIC PANEL
ANION GAP: 8 (ref 5–15)
BUN: 37 mg/dL — AB (ref 8–23)
CHLORIDE: 101 mmol/L (ref 98–111)
CO2: 28 mmol/L (ref 22–32)
Calcium: 8.2 mg/dL — ABNORMAL LOW (ref 8.9–10.3)
Creatinine, Ser: 3.42 mg/dL — ABNORMAL HIGH (ref 0.61–1.24)
GFR, EST AFRICAN AMERICAN: 19 mL/min — AB (ref >60)
GFR, EST NON AFRICAN AMERICAN: 16 mL/min — AB (ref >60)
Glucose, Bld: 127 mg/dL — ABNORMAL HIGH (ref 70–99)
POTASSIUM: 4.5 mmol/L (ref 3.5–5.1)
SODIUM: 137 mmol/L (ref 135–145)

## 2018-03-13 LAB — CBC
HEMATOCRIT: 27.1 % — AB (ref 39.0–52.0)
HEMOGLOBIN: 8.2 g/dL — AB (ref 13.0–17.0)
MCH: 27.3 pg (ref 26.0–34.0)
MCHC: 30.3 g/dL (ref 30.0–36.0)
MCV: 90.3 fL (ref 80.0–100.0)
Platelets: 292 10*3/uL (ref 150–400)
RBC: 3 MIL/uL — AB (ref 4.22–5.81)
RDW: 14.5 % (ref 11.5–15.5)
WBC: 8 10*3/uL (ref 4.0–10.5)
nRBC: 0 % (ref 0.0–0.2)

## 2018-03-13 LAB — QUANTIFERON-TB GOLD PLUS: QUANTIFERON-TB GOLD PLUS: NEGATIVE

## 2018-03-13 LAB — QUANTIFERON-TB GOLD PLUS (RQFGPL)
QUANTIFERON NIL VALUE: 0.06 [IU]/mL
QUANTIFERON TB1 AG VALUE: 0.2 [IU]/mL
QUANTIFERON TB2 AG VALUE: 0.19 [IU]/mL
QuantiFERON Mitogen Value: 6.83 IU/mL

## 2018-03-13 LAB — GLUCOSE, CAPILLARY
GLUCOSE-CAPILLARY: 103 mg/dL — AB (ref 70–99)
Glucose-Capillary: 126 mg/dL — ABNORMAL HIGH (ref 70–99)

## 2018-03-13 MED ORDER — CALCIUM ACETATE (PHOS BINDER) 667 MG PO CAPS: 667.0000 mg | ORAL_CAPSULE | Freq: Three times a day (TID) | ORAL | 0 refills | Status: DC

## 2018-03-13 MED ORDER — CHLORHEXIDINE GLUCONATE CLOTH 2 % EX PADS
6.0000 | MEDICATED_PAD | Freq: Every day | CUTANEOUS | Status: DC
  Administered 2018-03-13: 6 via TOPICAL

## 2018-03-13 MED ORDER — OXYCODONE HCL 5 MG PO TABS: 5.0000 mg | ORAL_TABLET | Freq: Four times a day (QID) | ORAL | Status: DC | PRN

## 2018-03-13 MED ORDER — INSULIN DEGLUDEC 100 UNIT/ML ~~LOC~~ SOPN: 6.0000 [IU] | PEN_INJECTOR | Freq: Every day | SUBCUTANEOUS | 0 refills | Status: AC

## 2018-03-13 MED ORDER — LOSARTAN POTASSIUM 25 MG PO TABS: 25.0000 mg | ORAL_TABLET | Freq: Every day | ORAL | 0 refills | Status: DC

## 2018-03-13 NOTE — Progress Notes (Signed)
HD initiated via R Chest HD catheter. Dressing changed. No heparin treatment. All questions answered prior to start. Educated on catheter care.

## 2018-03-13 NOTE — Progress Notes (Signed)
Pre hd 

## 2018-03-13 NOTE — Progress Notes (Signed)
Hemodialysis-Treatment completed without issu. 1L removed as ordered. Patient without complaints. Vitals remained stable throughout. Report called to primary RN.

## 2018-03-13 NOTE — Plan of Care (Signed)
  Problem: Clinical Measurements: Goal: Diagnostic test results will improve Outcome: Progressing Note:  BNP on admission 2611 BUN 37/3.42 today    Problem: Activity: Goal: Capacity to carry out activities will improve Outcome: Progressing Note:  Remains on Torsimide, daily weights and intake and output   Problem: Clinical Measurements: Goal: Will remain free from infection Note:  Remains afebrile

## 2018-03-13 NOTE — Discharge Instructions (Signed)
It was so nice to meet you during this hospitalization!  You were started on dialysis while you were here. You will continue to get dialysis on Monday, Wednesday, and Friday as an outpatient.  You had some vomiting while you were here. Your abdominal x-ray looked completely normal. Please make sure you follow-up with your gastroenterologist when you leave the hospital.  We have made the following medication changes: 1. Please STOP taking the Norvasc and START taking Losartan 25mg  daily instead 2. Please STOP taking the Lasix and continue taking Torsemide 40mg  twice a day. 3. Please start taking Phoslo three times a day with meals  Take care, Dr. Brett Albino

## 2018-03-13 NOTE — Discharge Summary (Signed)
[place] [place] NAME: Aveer Bartow    MR#:  [id number]  DATE OF BIRTH:  1945/05/14  DATE OF ADMISSION:  03/08/2018   ADMITTING PHYSICIAN: Arta Silence, MD  DATE OF DISCHARGE: 03/13/18  PRIMARY CARE PHYSICIAN: Birdie Sons, MD   ADMISSION DIAGNOSIS:  Shortness of breath [R06.02] Elevated troponin [R79.89] AKI (acute kidney injury) ([place]) [N17.9] Ascites [R18.8] Acute on chronic congestive heart failure, unspecified heart failure type ([place]) [I50.9] DISCHARGE DIAGNOSIS:  Active Problems:   Acute on chronic systolic CHF (congestive heart failure) ([place])  SECONDARY DIAGNOSIS:   Past Medical History:  Diagnosis Date  . Allergy   . Arthritis   . CAD (coronary artery disease)   . Chronic kidney disease   . Diabetes mellitus without complication ([place])   . Dyspnea   . Erosive esophagitis   . GERD (gastroesophageal reflux disease)   . Gouty arthropathy   . History of colonic polyps   . History of kidney stones   . History of MRSA infection   . Hyperkalemia   . Hyperlipidemia   . Hypertension   . Melanoma ([place])   . Myocardial infarction ([place]) 1986  . Peripheral vascular disease ([place])   . 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 Trigger finger of left hand   . Ureteral tumor    HOSPITAL COURSE:   Lincon is a 72 year old male who presented to the ED with shortness of breath and lower extremity edema. He was found to have acute on chronic systolic heart failure and worsening renal failure in CKD VI. He was admitted for further management.  1. 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 Fistula infiltrated and temporary HD cath was placed 11/23. Fistulogram 11/25 did not show any stenosis. Plan to continue outpatient HD on MWF.   2. Acute systolic CHF- initially on bumex, then changed to torsemide.  3. Type 2 diabetes mellitus with  chronic kidney disease.  Continued home insulin. 4. Anemia of chronic disease on Epogen 5. Ventral hernia- stable. 6. GERD on PPI 7. Hyperlipidemia unspecified on atorvastatin 8. Hypertension- continued metoprolol. norvasc stopped and losartan started per nephrology recommendations.  DISCHARGE CONDITIONS:  ESRD on HD Chronic systolic heart failrue Type 2 diabetes Anemia of chronic kidney disease Ventral hernia GERD HLD HTN CONSULTS OBTAINED:  Treatment Team:  Arta Silence, MD Lavonia Dana, MD Corey Skains, MD Algernon Huxley, MD DRUG ALLERGIES:   Allergies  Allergen Reactions  . Ciprofloxacin Itching and Swelling    Facial swelling   . Trulicity [Dulaglutide]     Bloating  . Hydralazine Nausea And Vomiting   DISCHARGE MEDICATIONS:   Allergies as of 03/13/2018      Reactions   Ciprofloxacin Itching, Swelling   Facial swelling    Trulicity [dulaglutide]    Bloating   Hydralazine Nausea And Vomiting      Medication List    STOP taking these medications   amLODipine 2.5 MG tablet Commonly known as:  NORVASC   colchicine 0.6 MG tablet   furosemide 40 MG tablet Commonly known as:  LASIX   glipiZIDE 10 MG 24 hr tablet Commonly known as:  GLUCOTROL XL     TAKE these medications   acetaminophen 500 MG tablet Commonly known as:  TYLENOL Take 500 mg by mouth every 8 (eight) hours as needed for mild pain or headache.  aspirin EC 81 MG tablet Take 81 mg by mouth daily.   atorvastatin 80 MG tablet Commonly known as:  LIPITOR TAKE 1 TABLET BY MOUTH ONCE DAILY What changed:    how much to take  how to take this  when to take this   calcium acetate 667 MG capsule Commonly known as:  PHOSLO Take 1 capsule (667 mg total) by mouth 3 (three) times daily with meals.   clopidogrel 75 MG tablet Commonly known as:  PLAVIX TAKE 1 TABLET BY MOUTH ONCE DAILY   glucose blood test strip Contour Ascensia test strips and lancets Use to check blood  sugar daily for type 2 diabetes, E11.9.   insulin degludec 100 UNIT/ML Sopn FlexTouch Pen Commonly known as:  TRESIBA Inject 0.06 mLs (6 Units total) into the skin daily. Increase by 2 units a day if fasting glucose is over 200   isosorbide mononitrate 60 MG 24 hr tablet Commonly known as:  IMDUR TAKE 1 TABLET BY MOUTH ONCE DAILY.   losartan 25 MG tablet Commonly known as:  COZAAR Take 1 tablet (25 mg total) by mouth at bedtime.   metoprolol succinate 50 MG 24 hr tablet Commonly known as:  TOPROL-XL TAKE 1 TABLET BY MOUTH ONCE DAILY WITH OR IMMEDIATELY FOLLOWING A MEAL.   ONE TOUCH ULTRA 2 w/Device Kit Use to check gluocose daily for type 2 diabetes   pantoprazole 40 MG tablet Commonly known as:  PROTONIX Take 1 tablet (40 mg total) by mouth daily. What changed:  when to take this   sodium bicarbonate 650 MG tablet Take 650 mg by mouth 2 (two) times daily.   tamsulosin 0.4 MG Caps capsule Commonly known as:  FLOMAX TAKE 1 CAPSULE BY MOUTH ONCE DAILY AFTERSUPPER   torsemide 20 MG tablet Commonly known as:  DEMADEX Take 2 tablets (40 mg total) by mouth 2 (two) times daily.   traMADol 50 MG tablet Commonly known as:  ULTRAM Take 1 tablet (50 mg total) by mouth every 6 (six) hours as needed.        DISCHARGE INSTRUCTIONS:  1. F/u with PCP in 1-2 weeks 2. Patient to receive HD MWF in [place] DIET:  Renal diet DISCHARGE CONDITION:  Stable ACTIVITY:  Activity as tolerated OXYGEN:  Home Oxygen: No.  Oxygen Delivery: room air DISCHARGE LOCATION:  home   If you experience worsening of your admission symptoms, develop shortness of breath, life threatening emergency, suicidal or homicidal thoughts you must seek medical attention immediately by calling 911 or calling your MD immediately  if symptoms less severe.  You Must read complete instructions/literature along with all the possible adverse reactions/side effects for all the Medicines you take and that have been  prescribed to you. Take any new Medicines after you have completely understood and accpet all the possible adverse reactions/side effects.   Please note  You were cared for by a hospitalist during your hospital stay. If you have any questions about your discharge medications or the care you received while you were in the hospital after you are discharged, you can call the unit and asked to speak with the hospitalist on call if the hospitalist that took care of you is not available. Once you are discharged, your primary care physician will handle any further medical issues. Please note that NO REFILLS for any discharge medications will be authorized once you are discharged, as it is imperative that you return to your primary care physician (or establish a relationship with a primary  care physician if you do not have one) for your aftercare needs so that they can reassess your need for medications and monitor your lab values.    On the day of Discharge:  VITAL SIGNS:  Blood pressure (!) 98/54, pulse 69, temperature 98.2 F (36.8 C), temperature source Oral, resp. rate 11, height 5' 6"  (1.676 m), weight 76.9 kg, SpO2 94 %. PHYSICAL EXAMINATION:  GENERAL:  72 y.o.-year-old patient lying in the bed with no acute distress.  EYES: Pupils equal, round, reactive to light and accommodation. No scleral icterus. Extraocular muscles intact.  HEENT: Head atraumatic, normocephalic. Oropharynx and nasopharynx clear.  NECK:  Supple, no jugular venous distention. No thyroid enlargement, no tenderness.  LUNGS: Normal breath sounds bilaterally, no wheezing, rales,rhonchi or crepitation. No use of accessory muscles of respiration.  CARDIOVASCULAR: S1, S2 normal. No murmurs, rubs, or gallops.  ABDOMEN: Soft, non-tender, non-distended. Bowel sounds present. No organomegaly or mass.  EXTREMITIES: Trace pedal edema, no cyanosis or clubbing. + L AVF with bruit NEUROLOGIC: Cranial nerves II through XII are intact. Muscle  strength 5/5 in all extremities. Sensation intact. Gait not checked.  PSYCHIATRIC: The patient is alert and oriented x 3.  SKIN: No obvious rash, lesion, or ulcer.  DATA REVIEW:   CBC Recent Labs  Lab 03/13/18 0458  WBC 8.0  HGB 8.2*  HCT 27.1*  PLT 292    Chemistries  Recent Labs  Lab 03/09/18 0243  03/12/18 0416 03/13/18 0458  NA  --    < > 137 137  K  --    < > 4.6 4.5  CL  --    < > 103 101  CO2  --    < > 25 28  GLUCOSE  --    < > 145* 127*  BUN  --    < > 54* 37*  CREATININE  --    < > 4.19* 3.42*  CALCIUM  --    < > 8.4* 8.2*  MG 2.2   < > 2.0  --   AST 16  --   --   --   ALT 15  --   --   --   ALKPHOS 98  --   --   --   BILITOT 0.4  --   --   --    < > = values in this interval not displayed.     Microbiology Results  Results for orders placed or performed in visit on 07/03/17  Urine Culture     Status: None   Collection Time: 07/03/17 12:14 PM  Result Value Ref Range Status   Urine Culture, Routine Final report  Final   Organism ID, Bacteria Comment  Final    Comment: Mixed urogenital flora Less than 10,000 colonies/mL     RADIOLOGY:  Dg Abd 1 View  Result Date: 03/13/2018 CLINICAL DATA:  Abdominal pain and vomiting EXAM: ABDOMEN - 1 VIEW COMPARISON:  03/09/2018 FINDINGS: Scattered large and small bowel gas is noted. No significant constipation or obstructive change is seen. No free air is noted. No abnormal mass or abnormal calcifications are seen. Postsurgical changes are noted. No bony abnormality is noted. IMPRESSION: No acute abnormality seen. Electronically Signed   By: Inez Catalina M.D.   On: 03/13/2018 08:57     Management plans discussed with the patient, family and they are in agreement.  CODE STATUS: Full Code   TOTAL TIME TAKING CARE OF THIS PATIENT: 45 minutes.    Valetta Fuller  D Camdyn Laden M.D on 03/13/2018 at 10:46 AM  Between 7am to 6pm - Pager - [phone]  After 6pm go to [email or web address] - Proofreader  [facility]ospital[facility]  Office  [phone]  CC: Primary care physician; Birdie Sons, MD   Note: This dictation was prepared with Dragon dictation along with smaller phrase technology. Any transcriptional errors that result from this process are unintentional.

## 2018-03-13 NOTE — Progress Notes (Signed)
To dialysis via bed.

## 2018-03-13 NOTE — Progress Notes (Signed)
Pt discharged to home via wc.  Instructions  given to pt.  Questions answered.  No distress.  

## 2018-03-13 NOTE — Progress Notes (Signed)
Post hd assessment unchanged  

## 2018-03-14 ENCOUNTER — Telehealth: Payer: Self-pay | Admitting: Family Medicine

## 2018-03-14 DIAGNOSIS — G479 Sleep disorder, unspecified: Secondary | ICD-10-CM

## 2018-03-14 MED ORDER — TRAZODONE HCL 50 MG PO TABS: 25.0000 mg | ORAL_TABLET | Freq: Every evening | ORAL | 0 refills | Status: DC | PRN

## 2018-03-14 NOTE — Telephone Encounter (Signed)
Transition Care Management Follow-up Telephone Call  Date of discharge and from where: [facility] on 03/13/18  How have you been since you were released from the hospital? Doing better with breathing. Still having pain in the abdomen and some pain around the port. Wife states pt cannot get comfortable with the new port and is affecting his sleep. Pt is constipated but they believe that is due to a low food intake. Pt also c/o of not urinating as much. Wife states he is drinking fluids and they will increase that today. Declined fever, n/v/d or any other urinary symptoms.   Any questions or concerns? Yes, not sleeping good due to port. Pt would like a prescription to help aid with this.   Items Reviewed:  Did the pt receive and understand the discharge instructions provided? Yes   Medications obtained and verified? Will verify at [facility] apt.  Any new allergies since your discharge? No   Dietary orders reviewed? Yes  Do you have support at home? Yes   Other (ie: DME, Home Health, etc) N/A  Functional Questionnaire: (I = Independent and D = Dependent)  Bathing/Dressing- I   Meal Prep- I  Eating- I  Maintaining continence- I  Transferring/Ambulation- I  Managing Meds- I   Follow up appointments reviewed:    PCP Hospital f/u appt confirmed? Yes  Scheduled to see Dr Caryn Section on 03/26/18 @ 3:20 PM.  [facility] f/u appt confirmed? Yes    Are transportation arrangements needed? No   If their condition worsens, is the pt aware to call  their PCP or go to the ED? Yes  Was the patient provided with contact information for the PCP's office or ED? Yes  Was the pt encouraged to call back with questions or concerns? Yes

## 2018-03-14 NOTE — Telephone Encounter (Signed)
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 Patient is requesting something to help him sleep at night.  Any recommendations? He was seen by [facility] yesterday and she documented about patient not sleeping well due to port.  Please advise

## 2018-03-14 NOTE — Telephone Encounter (Signed)
Patient's daughter Lexine Baton advised.

## 2018-03-14 NOTE — Telephone Encounter (Signed)
Patient's wife is requesting something to help Thomas [name] sleep at night.  Requested this yesterday also.  [facility].

## 2018-03-14 NOTE — Telephone Encounter (Signed)
Sent in a few days of trazodone. He does need to see Dr. Caryn Section to address since this is a new issue with his port. He does have appt on 03/26/18 with [facility].  JB

## 2018-03-15 NOTE — Progress Notes (Deleted)
   Patient ID: Thomas [name], male    DOB: 25-Jan-1946, 72 y.o.   MRN: [id number]  HPI  Thomas [name] is a 72 y/o male with a history of   Echo report from 03/09/18 reviewed and showed an EF of 20% along with trivial AR and moderate Thomas/TR.   Cardiac catheterization done 05/29/17 showed:  1.  Three-vessel coronary artery disease with 90% stenosis mid LAD, occluded proximal left circumflex, occluded proximal RCA, and occluded saphenous vein bypass graft to RCA. 2.  Successful PCI, with DES mid LAD, TIMI III pre-and post PCI.  Admitted 03/08/18 due to acute on chronic HF along with worsening renal injury. Cardiology and vascular consults obtained. Dialysis was started and diuretic was changed. Discharged after 5 days. Admitted 02/24/18 due to acute on chronic HF. Weaned off oxygen. Nephrology saw patient. Elevated troponin thought to be due to severe renal insufficiency. Discharged the following day. Was in the ED 02/21/18 due to shortness of breath. Treated and released.   He presents today for his initial visit with a chief complaint of   Review of Systems    Physical Exam  Assessment & Plan:  1: Chronic heart failure with reduced ejection fraction- - NYHA class - BNP 03/09/18 was 2611.0  2: HTN- - BP - BMP 03/13/18 reviewed and showed sodium 137, potassium 4.5, creatinine 3.42 and GFR 16  3: DM- - A1c 01/24/18 was 9.3%  4: CKD- - now dialysis dependent

## 2018-03-16 DIAGNOSIS — N186 End stage renal disease: Secondary | ICD-10-CM | POA: Diagnosis not present

## 2018-03-16 DIAGNOSIS — Z992 Dependence on renal dialysis: Secondary | ICD-10-CM | POA: Diagnosis not present

## 2018-03-16 DIAGNOSIS — E119 Type 2 diabetes mellitus without complications: Secondary | ICD-10-CM | POA: Diagnosis not present

## 2018-03-16 DIAGNOSIS — Z794 Long term (current) use of insulin: Secondary | ICD-10-CM | POA: Diagnosis not present

## 2018-03-17 ENCOUNTER — Other Ambulatory Visit: Payer: Self-pay

## 2018-03-17 ENCOUNTER — Emergency Department: Payer: Medicare Other

## 2018-03-17 ENCOUNTER — Inpatient Hospital Stay
Admission: EM | Admit: 2018-03-17 | Discharge: 2018-03-18 | DRG: 291 | Disposition: A | Discharge status: Home / Self Care | Payer: Medicare Other | Attending: Internal Medicine | Admitting: Internal Medicine

## 2018-03-17 DIAGNOSIS — R0602 Shortness of breath: Secondary | ICD-10-CM | POA: Diagnosis not present

## 2018-03-17 DIAGNOSIS — I132 Hypertensive heart and chronic kidney disease with heart failure and with stage 5 chronic kidney disease, or end stage renal disease: Principal | ICD-10-CM | POA: Diagnosis present

## 2018-03-17 DIAGNOSIS — J90 Pleural effusion, not elsewhere classified: Secondary | ICD-10-CM

## 2018-03-17 DIAGNOSIS — I081 Rheumatic disorders of both mitral and tricuspid valves: Secondary | ICD-10-CM | POA: Diagnosis not present

## 2018-03-17 DIAGNOSIS — I5023 Acute on chronic systolic (congestive) heart failure: Secondary | ICD-10-CM | POA: Diagnosis present

## 2018-03-17 DIAGNOSIS — R079 Chest pain, unspecified: Secondary | ICD-10-CM

## 2018-03-17 DIAGNOSIS — J309 Allergic rhinitis, unspecified: Secondary | ICD-10-CM | POA: Diagnosis present

## 2018-03-17 DIAGNOSIS — R0789 Other chest pain: Secondary | ICD-10-CM | POA: Diagnosis not present

## 2018-03-17 DIAGNOSIS — N186 End stage renal disease: Secondary | ICD-10-CM | POA: Diagnosis present

## 2018-03-17 DIAGNOSIS — Z87891 Personal history of nicotine dependence: Secondary | ICD-10-CM

## 2018-03-17 DIAGNOSIS — Z8614 Personal history of Methicillin resistant Staphylococcus aureus infection: Secondary | ICD-10-CM | POA: Diagnosis not present

## 2018-03-17 DIAGNOSIS — Z9842 Cataract extraction status, left eye: Secondary | ICD-10-CM

## 2018-03-17 DIAGNOSIS — Z9841 Cataract extraction status, right eye: Secondary | ICD-10-CM

## 2018-03-17 DIAGNOSIS — M109 Gout, unspecified: Secondary | ICD-10-CM | POA: Diagnosis present

## 2018-03-17 DIAGNOSIS — K219 Gastro-esophageal reflux disease without esophagitis: Secondary | ICD-10-CM | POA: Diagnosis present

## 2018-03-17 DIAGNOSIS — E1122 Type 2 diabetes mellitus with diabetic chronic kidney disease: Secondary | ICD-10-CM | POA: Diagnosis not present

## 2018-03-17 DIAGNOSIS — Z955 Presence of coronary angioplasty implant and graft: Secondary | ICD-10-CM

## 2018-03-17 DIAGNOSIS — Z951 Presence of aortocoronary bypass graft: Secondary | ICD-10-CM

## 2018-03-17 DIAGNOSIS — Z79899 Other long term (current) drug therapy: Secondary | ICD-10-CM

## 2018-03-17 DIAGNOSIS — Z7982 Long term (current) use of aspirin: Secondary | ICD-10-CM

## 2018-03-17 DIAGNOSIS — Z79891 Long term (current) use of opiate analgesic: Secondary | ICD-10-CM

## 2018-03-17 DIAGNOSIS — E785 Hyperlipidemia, unspecified: Secondary | ICD-10-CM | POA: Diagnosis present

## 2018-03-17 DIAGNOSIS — D631 Anemia in chronic kidney disease: Secondary | ICD-10-CM | POA: Diagnosis present

## 2018-03-17 DIAGNOSIS — I252 Old myocardial infarction: Secondary | ICD-10-CM | POA: Diagnosis not present

## 2018-03-17 DIAGNOSIS — R600 Localized edema: Secondary | ICD-10-CM | POA: Diagnosis not present

## 2018-03-17 DIAGNOSIS — Z992 Dependence on renal dialysis: Secondary | ICD-10-CM | POA: Diagnosis not present

## 2018-03-17 DIAGNOSIS — Z961 Presence of intraocular lens: Secondary | ICD-10-CM | POA: Diagnosis not present

## 2018-03-17 DIAGNOSIS — I248 Other forms of acute ischemic heart disease: Secondary | ICD-10-CM | POA: Diagnosis present

## 2018-03-17 DIAGNOSIS — Z888 Allergy status to other drugs, medicaments and biological substances status: Secondary | ICD-10-CM

## 2018-03-17 DIAGNOSIS — J811 Chronic pulmonary edema: Secondary | ICD-10-CM | POA: Diagnosis not present

## 2018-03-17 DIAGNOSIS — Z881 Allergy status to other antibiotic agents status: Secondary | ICD-10-CM

## 2018-03-17 DIAGNOSIS — I2510 Atherosclerotic heart disease of native coronary artery without angina pectoris: Secondary | ICD-10-CM | POA: Diagnosis present

## 2018-03-17 DIAGNOSIS — I509 Heart failure, unspecified: Secondary | ICD-10-CM | POA: Diagnosis not present

## 2018-03-17 DIAGNOSIS — Z7902 Long term (current) use of antithrombotics/antiplatelets: Secondary | ICD-10-CM

## 2018-03-17 DIAGNOSIS — E1151 Type 2 diabetes mellitus with diabetic peripheral angiopathy without gangrene: Secondary | ICD-10-CM | POA: Diagnosis not present

## 2018-03-17 DIAGNOSIS — J810 Acute pulmonary edema: Secondary | ICD-10-CM | POA: Diagnosis not present

## 2018-03-17 DIAGNOSIS — I700 Atherosclerosis of aorta: Secondary | ICD-10-CM | POA: Diagnosis not present

## 2018-03-17 DIAGNOSIS — Z794 Long term (current) use of insulin: Secondary | ICD-10-CM

## 2018-03-17 DIAGNOSIS — Z85820 Personal history of malignant melanoma of skin: Secondary | ICD-10-CM

## 2018-03-17 DIAGNOSIS — I5020 Unspecified systolic (congestive) heart failure: Secondary | ICD-10-CM | POA: Diagnosis not present

## 2018-03-17 LAB — BASIC METABOLIC PANEL
Anion gap: 8 (ref 5–15)
BUN: 30 mg/dL — ABNORMAL HIGH (ref 8–23)
CO2: 29 mmol/L (ref 22–32)
Calcium: 8 mg/dL — ABNORMAL LOW (ref 8.9–10.3)
Chloride: 99 mmol/L (ref 98–111)
Creatinine, Ser: 3.02 mg/dL — ABNORMAL HIGH (ref 0.61–1.24)
GFR calc Af Amer: 22 mL/min — ABNORMAL LOW (ref >60)
GFR calc non Af Amer: 19 mL/min — ABNORMAL LOW (ref >60)
GLUCOSE: 182 mg/dL — AB (ref 70–99)
POTASSIUM: 4.6 mmol/L (ref 3.5–5.1)
Sodium: 136 mmol/L (ref 135–145)

## 2018-03-17 LAB — CBC WITH DIFFERENTIAL/PLATELET
Abs Immature Granulocytes: 0.06 10*3/uL (ref 0.00–0.07)
Basophils Absolute: 0.1 10*3/uL (ref 0.0–0.1)
Basophils Relative: 1 %
Eosinophils Absolute: 0.3 10*3/uL (ref 0.0–0.5)
Eosinophils Relative: 3 %
HCT: 31.1 % — ABNORMAL LOW (ref 39.0–52.0)
Hemoglobin: 9.6 g/dL — ABNORMAL LOW (ref 13.0–17.0)
Immature Granulocytes: 1 %
Lymphocytes Relative: 21 %
Lymphs Abs: 1.8 10*3/uL (ref 0.7–4.0)
MCH: 27.7 pg (ref 26.0–34.0)
MCHC: 30.9 g/dL (ref 30.0–36.0)
MCV: 89.9 fL (ref 80.0–100.0)
MONO ABS: 1.1 10*3/uL — AB (ref 0.1–1.0)
MONOS PCT: 13 %
Neutro Abs: 5.1 10*3/uL (ref 1.7–7.7)
Neutrophils Relative %: 61 %
Platelets: 283 10*3/uL (ref 150–400)
RBC: 3.46 MIL/uL — ABNORMAL LOW (ref 4.22–5.81)
RDW: 14.3 % (ref 11.5–15.5)
WBC: 8.4 10*3/uL (ref 4.0–10.5)
nRBC: 0 % (ref 0.0–0.2)

## 2018-03-17 LAB — GLUCOSE, CAPILLARY: Glucose-Capillary: 160 mg/dL — ABNORMAL HIGH (ref 70–99)

## 2018-03-17 LAB — TROPONIN I
TROPONIN I: 0.05 ng/mL — AB (ref <0.03)
Troponin I: 0.06 ng/mL (ref <0.03)
Troponin I: 0.07 ng/mL (ref <0.03)
Troponin I: 0.09 ng/mL (ref <0.03)

## 2018-03-17 LAB — BRAIN NATRIURETIC PEPTIDE: B Natriuretic Peptide: 3015 pg/mL — ABNORMAL HIGH (ref 0.0–100.0)

## 2018-03-17 MED ORDER — ACETAMINOPHEN 325 MG PO TABS: 650.0000 mg | ORAL_TABLET | ORAL | Status: DC | PRN

## 2018-03-17 MED ORDER — SODIUM CHLORIDE 0.9 % IV SOLN: 250.0000 mL | INTRAVENOUS | Status: DC | PRN

## 2018-03-17 MED ORDER — SODIUM BICARBONATE 650 MG PO TABS: 650.0000 mg | ORAL_TABLET | Freq: Two times a day (BID) | ORAL | Status: DC

## 2018-03-17 MED ORDER — TAMSULOSIN HCL 0.4 MG PO CAPS
0.4000 mg | ORAL_CAPSULE | Freq: Every day | ORAL | Status: DC
  Administered 2018-03-17: 0.4 mg via ORAL
  Filled 2018-03-17: qty 1

## 2018-03-17 MED ORDER — POLYETHYLENE GLYCOL 3350 17 G PO PACK
17.0000 g | PACK | Freq: Every day | ORAL | Status: DC
  Administered 2018-03-18: 17 g via ORAL
  Filled 2018-03-17: qty 1

## 2018-03-17 MED ORDER — ASPIRIN EC 81 MG PO TBEC
81.0000 mg | DELAYED_RELEASE_TABLET | Freq: Every day | ORAL | Status: DC
  Administered 2018-03-17 – 2018-03-18 (×2): 81 mg via ORAL
  Filled 2018-03-17 (×2): qty 1

## 2018-03-17 MED ORDER — ONDANSETRON HCL 4 MG/2ML IJ SOLN: 4.0000 mg | Freq: Four times a day (QID) | INTRAMUSCULAR | Status: DC | PRN

## 2018-03-17 MED ORDER — CHLORHEXIDINE GLUCONATE CLOTH 2 % EX PADS
6.0000 | MEDICATED_PAD | Freq: Every day | CUTANEOUS | Status: DC
  Administered 2018-03-18: 6 via TOPICAL
  Filled 2018-03-17: qty 6

## 2018-03-17 MED ORDER — HEPARIN SODIUM (PORCINE) 5000 UNIT/ML IJ SOLN
5000.0000 [IU] | Freq: Three times a day (TID) | INTRAMUSCULAR | Status: DC
  Administered 2018-03-17 – 2018-03-18 (×2): 5000 [IU] via SUBCUTANEOUS
  Filled 2018-03-17 (×4): qty 1

## 2018-03-17 MED ORDER — TORSEMIDE 20 MG PO TABS
40.0000 mg | ORAL_TABLET | Freq: Two times a day (BID) | ORAL | Status: DC
  Administered 2018-03-17 – 2018-03-18 (×2): 40 mg via ORAL
  Filled 2018-03-17 (×2): qty 2

## 2018-03-17 MED ORDER — MORPHINE SULFATE (PF) 2 MG/ML IV SOLN
2.0000 mg | Freq: Once | INTRAVENOUS | Status: AC
  Administered 2018-03-17: 2 mg via INTRAVENOUS
  Filled 2018-03-17: qty 1

## 2018-03-17 MED ORDER — SODIUM CHLORIDE 0.9% FLUSH
3.0000 mL | Freq: Two times a day (BID) | INTRAVENOUS | Status: DC
  Administered 2018-03-17 – 2018-03-18 (×3): 3 mL via INTRAVENOUS

## 2018-03-17 MED ORDER — ONDANSETRON HCL 4 MG/2ML IJ SOLN
4.0000 mg | Freq: Four times a day (QID) | INTRAMUSCULAR | Status: DC | PRN
  Administered 2018-03-17: 4 mg via INTRAVENOUS
  Filled 2018-03-17: qty 2

## 2018-03-17 MED ORDER — LOSARTAN POTASSIUM 25 MG PO TABS
25.0000 mg | ORAL_TABLET | Freq: Every day | ORAL | Status: DC
  Filled 2018-03-17: qty 1

## 2018-03-17 MED ORDER — TRAZODONE HCL 50 MG PO TABS
25.0000 mg | ORAL_TABLET | Freq: Every evening | ORAL | Status: DC | PRN
  Administered 2018-03-17: 25 mg via ORAL
  Filled 2018-03-17: qty 1

## 2018-03-17 MED ORDER — INSULIN ASPART 100 UNIT/ML ~~LOC~~ SOLN: 0.0000 [IU] | Freq: Three times a day (TID) | SUBCUTANEOUS | Status: DC

## 2018-03-17 MED ORDER — CALCIUM ACETATE (PHOS BINDER) 667 MG PO CAPS
667.0000 mg | ORAL_CAPSULE | Freq: Three times a day (TID) | ORAL | Status: DC
  Administered 2018-03-17 – 2018-03-18 (×2): 667 mg via ORAL
  Filled 2018-03-17 (×2): qty 1

## 2018-03-17 MED ORDER — IOHEXOL 350 MG/ML SOLN
60.0000 mL | Freq: Once | INTRAVENOUS | Status: AC | PRN
  Administered 2018-03-17: 60 mL via INTRAVENOUS

## 2018-03-17 MED ORDER — ISOSORBIDE MONONITRATE ER 60 MG PO TB24
60.0000 mg | ORAL_TABLET | Freq: Every day | ORAL | Status: DC
  Administered 2018-03-17 – 2018-03-18 (×2): 60 mg via ORAL
  Filled 2018-03-17: qty 1

## 2018-03-17 MED ORDER — PANTOPRAZOLE SODIUM 40 MG PO TBEC
40.0000 mg | DELAYED_RELEASE_TABLET | Freq: Every day | ORAL | Status: DC
  Administered 2018-03-17 – 2018-03-18 (×2): 40 mg via ORAL
  Filled 2018-03-17 (×2): qty 1

## 2018-03-17 MED ORDER — CLOPIDOGREL BISULFATE 75 MG PO TABS
75.0000 mg | ORAL_TABLET | Freq: Every day | ORAL | Status: DC
  Administered 2018-03-17 – 2018-03-18 (×2): 75 mg via ORAL
  Filled 2018-03-17 (×2): qty 1

## 2018-03-17 MED ORDER — TRAMADOL HCL 50 MG PO TABS: 50.0000 mg | ORAL_TABLET | Freq: Four times a day (QID) | ORAL | Status: DC | PRN

## 2018-03-17 MED ORDER — ATORVASTATIN CALCIUM 20 MG PO TABS
80.0000 mg | ORAL_TABLET | Freq: Every day | ORAL | Status: DC
  Administered 2018-03-17 – 2018-03-18 (×2): 80 mg via ORAL
  Filled 2018-03-17 (×2): qty 4

## 2018-03-17 MED ORDER — METOPROLOL SUCCINATE ER 50 MG PO TB24: 50.0000 mg | ORAL_TABLET | Freq: Every day | ORAL | Status: DC

## 2018-03-17 MED ORDER — METOPROLOL SUCCINATE ER 50 MG PO TB24
50.0000 mg | ORAL_TABLET | Freq: Every day | ORAL | Status: DC
  Filled 2018-03-17: qty 2

## 2018-03-17 MED ORDER — SODIUM CHLORIDE 0.9% FLUSH: 3.0000 mL | INTRAVENOUS | Status: DC | PRN

## 2018-03-17 MED ORDER — INSULIN ASPART 100 UNIT/ML ~~LOC~~ SOLN: 0.0000 [IU] | Freq: Every day | SUBCUTANEOUS | Status: DC

## 2018-03-17 MED ORDER — ALBUTEROL SULFATE (2.5 MG/3ML) 0.083% IN NEBU
5.0000 mg | INHALATION_SOLUTION | Freq: Once | RESPIRATORY_TRACT | Status: AC
  Administered 2018-03-17: 5 mg via RESPIRATORY_TRACT
  Filled 2018-03-17: qty 6

## 2018-03-17 NOTE — ED Triage Notes (Signed)
Pt stated that he has been feeling SOB since Tuesday . Pt recently discharged from hospital and was placed on dialysis. Pt has bilateral leg swelling.

## 2018-03-17 NOTE — ED Notes (Signed)
Report to cassie, rn.  

## 2018-03-17 NOTE — Progress Notes (Signed)
Hd start 

## 2018-03-17 NOTE — ED Notes (Signed)
Spouse asking if pt may have additional visitors. Explanation of two visitors at a time provided, spouse very upset with two visitors. Spouse informs this RN that remainder of visitors are coming back "because another nurse said it's ok."

## 2018-03-17 NOTE — Progress Notes (Signed)
[facility] [place], [place] 03/17/18  Subjective:   Patient was just discharged from the hospital November 25.  He comes in for worsening fluid overload and increasing shortness of breath.  He had his dialysis yesterday as outpatient.  Due to low blood pressure, he left 1 kg over his dry weight.  This morning, he is somewhat short of breath.  Room air.  Chest x-ray shows pleural effusions and pulmonary congestion.  Patient also reports fatigue, inability to sleep at night  Objective:  Vital signs in last 24 hours:  Temp:  [97.4 F (36.3 C)] 97.4 F (36.3 C) (11/30 0432) Pulse Rate:  [38-103] 38 (11/30 0900) Resp:  [0-24] 10 (11/30 0900) BP: (105-130)/(49-88) 111/61 (11/30 0900) SpO2:  [93 %-99 %] 97 % (11/30 0900) Weight:  [76.7 kg] 76.7 kg (11/30 0430)  Weight change:  Filed Weights   03/17/18 0430  Weight: 76.7 kg    Intake/Output:   No intake or output data in the 24 hours ending 03/17/18 0911   Physical Exam: General:  No acute distress, sitting up in the bed  HEENT  anicteric, moist oral mucous membranes  Neck  supple, distended neck veins  Pulm/lungs  mild diffuse crackles  CVS/Heart  regular with ectopic beats  Abdomen:   Soft, nontender   Extremities:   2+ pitting edema bilaterally  Neurologic:  Alert, oriented  Skin:  No acute rashes  Access:  Right IJ PermCath, left upper extremity AV fistula + bruit        Basic Metabolic Panel:  Recent Labs  Lab 03/11/18 0356 03/11/18 0540 03/12/18 0416 03/13/18 0458 03/17/18 0434  NA 140  --  137 137 136  K 4.2  --  4.6 4.5 4.6  CL 106  --  103 101 99  CO2 26  --  25 28 29   GLUCOSE 132*  --  145* 127* 182*  BUN 48*  --  54* 37* 30*  CREATININE 3.68*  --  4.19* 3.42* 3.02*  CALCIUM 8.1*  --  8.4* 8.2* 8.0*  MG  --  1.9 2.0  --   --   PHOS  --  4.6  --   --   --      CBC: Recent Labs  Lab 03/12/18 0416 03/13/18 0458 03/17/18 0434  WBC 7.4 8.0 8.4  NEUTROABS  --   --  5.1  HGB  8.8* 8.2* 9.6*  HCT 28.1* 27.1* 31.1*  MCV 90.6 90.3 89.9  PLT 325 292 283      Lab Results  Component Value Date   HEPBSAG Negative 03/09/2018   HEPBSAB Non Reactive 03/09/2018      Microbiology:  No results found for this or any previous visit (from the past 240 hour(s)).  Coagulation Studies: No results for input(s): LABPROT, INR in the last 72 hours.  Urinalysis: No results for input(s): COLORURINE, LABSPEC, PHURINE, GLUCOSEU, HGBUR, BILIRUBINUR, KETONESUR, PROTEINUR, UROBILINOGEN, NITRITE, LEUKOCYTESUR in the last 72 hours.  Invalid input(s): APPERANCEUR    Imaging: Dg Chest 2 View  Result Date: 03/17/2018 CLINICAL DATA:  Shortness of breath. EXAM: CHEST - 2 VIEW COMPARISON:  Radiographs 03/10/2018, additional priors. Chest CT 07/10/2017 FINDINGS: Right dialysis catheter tip in the mid SVC. Post median sternotomy and CABG. Unchanged cardiomegaly. Pulmonary edema appears similar to prior exam. Small right pleural effusion is unchanged. Small left pleural effusion has increased. No pneumothorax or focal airspace disease. No acute osseous abnormalities. IMPRESSION: Cardiomegaly with pulmonary edema and small bilateral pleural  effusions. The left pleural effusion has increased from radiograph 1 week ago, findings are otherwise unchanged. Electronically Signed   By: Keith Rake M.D.   On: 03/17/2018 05:36   Ct Angio Chest Pe W And/or Wo Contrast  Result Date: 03/17/2018 CLINICAL DATA:  Chest pain, complex, intermediate/high prob of ACS/PE/AAS. EXAM: CT ANGIOGRAPHY CHEST WITH CONTRAST TECHNIQUE: Multidetector CT imaging of the chest was performed using the standard protocol during bolus administration of intravenous contrast. Multiplanar CT image reconstructions and MIPs were obtained to evaluate the vascular anatomy. CONTRAST:  49mL OMNIPAQUE IOHEXOL 350 MG/ML SOLN COMPARISON:  Radiographs earlier this day. Chest CT 07/10/2017. Lung bases from abdominal CT 8 days ago  03/09/2018 FINDINGS: Cardiovascular: There are no filling defects within the pulmonary arteries to suggest pulmonary embolus. Left basilar subsegmental pulmonary arteries are partially obscured by cardiac motion artifact. Right internal jugular dialysis catheter tip at the atrial caval junction. Multi chamber cardiomegaly. Contrast refluxes into the hepatic veins and IVC. Post CABG with calcification of native coronary arteries. Atherosclerosis of the thoracic aorta without aneurysm. Cannot assess for dissection given phase of contrast. No pericardial effusion. Mediastinum/Nodes: Shotty mediastinal and bilateral hilar lymph nodes. Fluid level in the mid esophagus. No visualized thyroid nodule. Lungs/Pleura: Small to moderate bilateral pleural effusions, increased on the left over the past 8 days. Adjacent compressive atelectasis. Minimal septal thickening. Mild emphysema. No confluent airspace disease to suggest pneumonia. Upper Abdomen: Contrast refluxing into the hepatic veins and IVC. Musculoskeletal: Prior median sternotomy. There are no acute or suspicious osseous abnormalities. Multilevel degenerative change in the spine. Review of the MIP images confirms the above findings. IMPRESSION: 1. No pulmonary embolus. 2. Small to moderate bilateral pleural effusions, increased on the left over the past 8 days. Adjacent compressive atelectasis. 3. Multi chamber cardiomegaly. Contrast refluxing into the hepatic veins and IVC consistent with elevated right heart pressures. Mild pulmonary edema. Aortic Atherosclerosis (ICD10-I70.0) and Emphysema (ICD10-J43.9). Electronically Signed   By: Keith Rake M.D.   On: 03/17/2018 06:51     Medications:    . insulin aspart  0-15 Units Subcutaneous TID WC  . insulin aspart  0-5 Units Subcutaneous QHS     Assessment/ Plan:  72 y.o. male with diabetes, hypertension, severe coronary disease, CABG 1996, peripheral arterial disease, congestive heart failure. ESRD  started HD 03/10/2018.  2D echo from November 22 shows LVEF 20%, severely dilated left ventricle, global hypokinesis, moderate mitral regurgitation, moderate tricuspid regurgitation  [facility]/ MWF/ [facility]/ 77 kg  1. End stage renal disease.  We will arrange for hemodialysis today.  UF only treatment to remove fluid Goal fluid removal ~2-2.5 kg as tolerated  2. shortness of breath from pleural effusion and Acute pulmonary edema, lower extremity edema - ultrafiltration with dialysis.  - Hold blood pressure medications for now If blood pressure tends to get elevated, may restart losartan Dose all antihypertensives at night Fluid restriction to 32 ounces per day  3. Diabetes mellitus type II with chronic kidney disease: history of poor control.  Hemoglobin A1c 9.3% on 01/24/18.   4. Anemia of chronic kidney disease: hemoglobin 9.6 - EPO with HD treatments on MWF   LOS: 0 Thomas [name] 11/30/20199:11 AM  [place], [place]  Note: This note was prepared with Dragon dictation. Any transcription errors are unintentional

## 2018-03-17 NOTE — H&P (Signed)
[facility] [place] NAME: Thomas [name]    MR#:  [id number]  DATE OF BIRTH:  09-25-1945  DATE OF ADMISSION:  03/17/2018  PRIMARY CARE PHYSICIAN: Birdie Sons, MD   REQUESTING/REFERRING PHYSICIAN:   CHIEF COMPLAINT:   Chief Complaint  Patient presents with  . Shortness of Breath    HISTORY OF PRESENT ILLNESS: Thomas [name]  is a 72 y.o. male with a known history of coronary artery disease, end-stage renal disease started on dialysis last week, diabetes mellitus type 2, GERD, hyperlipidemia, hypertension, chronic congestive heart failure, melanoma, peripheral vascular disease presented to the emergency room for shortness of breath.  The shortness of breath has been worse for the last couple of days.  He felt congested.  He had dialysis yesterday.  No complaints of any chest discomfort.  Patient was evaluated in the emergency room his BNP was elevated chest x-ray revealed fluid overload and congestion.  Nephrology consultant was discussed and plan was for dialysis.  Hospitalist service was consulted for further care.  PAST MEDICAL HISTORY:   Past Medical History:  Diagnosis Date  . Allergy   . Arthritis   . CAD (coronary artery disease)   . Chronic kidney disease   . Diabetes mellitus without complication ([place])   . Dyspnea   . Erosive esophagitis   . GERD (gastroesophageal reflux disease)   . Gouty arthropathy   . History of colonic polyps   . History of kidney stones   . History of MRSA infection   . Hyperkalemia   . Hyperlipidemia   . Hypertension   . Melanoma ([place])   . Myocardial infarction ([place]) 1986  . Peripheral vascular disease ([place])   . 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 Trigger finger of left hand   . Ureteral tumor     PAST SURGICAL HISTORY:  Past Surgical History:  Procedure Laterality Date  . A/V SHUNT INTERVENTION Left 03/12/2018   Procedure: A/V SHUNT INTERVENTION;  Surgeon:  Algernon Huxley, MD;  Location: [place] [facility];  Service: Cardiovascular;  Laterality: Left;  . AV FISTULA PLACEMENT Left 12/07/2017   Procedure: ARTERIOVENOUS (AV) FISTULA CREATION ( BRACHIOBASILIC STAGE );  Surgeon: Algernon Huxley, MD;  Location: [facility];  Service: Vascular;  Laterality: Left;  . CATARACT EXTRACTION    . CORONARY ARTERY BYPASS GRAFT  1996  . CORONARY STENT INTERVENTION N/A 05/29/2017   Procedure: CORONARY STENT INTERVENTION;  Surgeon: Isaias Cowman, MD;  Location: [place] [facility];  Service: Cardiovascular;  Laterality: N/A;  . EXCISION Ureteral Tumor     (benign) Dr. Quillian Quince  . EYE SURGERY Bilateral    Cataract Extraction with IOL  . LEFT HEART CATH AND CORONARY ANGIOGRAPHY N/A 10/18/2016   Procedure: Left Heart Cath and Coronary Angiography;  Surgeon: Isaias Cowman, MD;  Location: [place] [facility];  Service: Cardiovascular;  Laterality: N/A;  . LEFT HEART CATH AND CORONARY ANGIOGRAPHY N/A 05/29/2017   Procedure: LEFT HEART CATH AND CORONARY ANGIOGRAPHY;  Surgeon: Isaias Cowman, MD;  Location: [place];  Service: Cardiovascular;  Laterality: N/A;  . LOWER EXTREMITY ANGIOGRAPHY Right 10/31/2016   Procedure: Lower Extremity Angiography;  Surgeon: Algernon Huxley, MD;  Location: [facility] [facility];  Service: Cardiovascular;  Laterality: Right;  . LOWER EXTREMITY ANGIOGRAPHY Left 11/14/2016   Procedure: Lower Extremity Angiography;  Surgeon: Algernon Huxley, MD;  Location: [place];  Service: Cardiovascular;  Laterality: Left;  . LOWER EXTREMITY INTERVENTION  11/14/2016   Procedure: Lower Extremity Intervention;  Surgeon: Algernon Huxley, MD;  Location: [place] [facility];  Service: Cardiovascular;;  . RETINAL DETACHMENT SURGERY Right November 2107    SOCIAL HISTORY:  Social History   Tobacco Use  . Smoking status: Former Smoker    Packs/day: 1.00    Years: 51.00    Pack years: 51.00    Types: Cigarettes    Last attempt to  quit: 06/21/2017    Years since quitting: 0.7  . Smokeless tobacco: Never Used  . Tobacco comment: started smoking at age 54-25. Smoked for 50 years  Substance Use Topics  . Alcohol use: Yes    Alcohol/week: 0.0 standard drinks    Frequency: Never    Comment: 1 every 6 months    FAMILY HISTORY:  Family History  Problem Relation Age of Onset  . Alzheimer's disease Mother   . Alzheimer's disease Brother   . Lung cancer Paternal Grandfather     DRUG ALLERGIES:  Allergies  Allergen Reactions  . Ciprofloxacin Itching and Swelling    Facial swelling   . Trulicity [Dulaglutide]     Bloating  . Hydralazine Nausea And Vomiting    REVIEW OF SYSTEMS:   CONSTITUTIONAL: No fever, has fatigue and weakness.  EYES: No blurred or double vision.  EARS, NOSE, AND THROAT: No tinnitus or ear pain.  RESPIRATORY: occasional cough, has shortness of breath, no wheezing or hemoptysis.  CARDIOVASCULAR: No chest pain, orthopnea, edema.  GASTROINTESTINAL: No nausea, vomiting, diarrhea or abdominal pain.  GENITOURINARY: No dysuria, hematuria.  ENDOCRINE: No polyuria, nocturia,  HEMATOLOGY: No anemia, easy bruising or bleeding SKIN: No rash or lesion. MUSCULOSKELETAL: No joint pain or arthritis.   NEUROLOGIC: No tingling, numbness, weakness.  PSYCHIATRY: No anxiety or depression.   MEDICATIONS AT HOME:  Prior to Admission medications   Medication Sig Start Date End Date Taking? Authorizing Provider  acetaminophen (TYLENOL) 500 MG tablet Take 500 mg by mouth every 8 (eight) hours as needed for mild pain or headache.   Yes [provider]  aspirin EC 81 MG tablet Take 81 mg by mouth daily.   Yes [provider]  atorvastatin (LIPITOR) 80 MG tablet TAKE 1 TABLET BY MOUTH ONCE DAILY Patient taking differently: daily at 6 PM.  08/19/17  Yes Fisher, Kirstie Peri, MD  calcium acetate (PHOSLO) 667 MG capsule Take 1 capsule (667 mg total) by mouth 3 (three) times daily with meals. 03/13/18   Yes Mayo, Pete Pelt, MD  clopidogrel (PLAVIX) 75 MG tablet TAKE 1 TABLET BY MOUTH ONCE DAILY Patient taking differently: Take 75 mg by mouth daily.  01/09/18  Yes Dew, Erskine Squibb, MD  insulin degludec (TRESIBA FLEXTOUCH) 100 UNIT/ML SOPN FlexTouch Pen Inject 0.06 mLs (6 Units total) into the skin daily. Increase by 2 units a day if fasting glucose is over 200 03/13/18  Yes Mayo, Pete Pelt, MD  isosorbide mononitrate (IMDUR) 60 MG 24 hr tablet TAKE 1 TABLET BY MOUTH ONCE DAILY. Patient taking differently: Take 60 mg by mouth daily.  01/08/18  Yes Birdie Sons, MD  losartan (COZAAR) 25 MG tablet Take 1 tablet (25 mg total) by mouth at bedtime. 03/13/18  Yes Mayo, Pete Pelt, MD  metoprolol succinate (TOPROL-XL) 50 MG 24 hr tablet TAKE 1 TABLET BY MOUTH ONCE DAILY WITH OR IMMEDIATELY FOLLOWING A MEAL. Patient taking differently: Take 50 mg by mouth daily.  01/08/18  Yes  Birdie Sons, MD  pantoprazole (PROTONIX) 40 MG tablet Take 1 tablet (40 mg total) by mouth daily. Patient taking differently: Take 40 mg by mouth 2 (two) times daily.  06/03/16  Yes Birdie Sons, MD  sodium bicarbonate 650 MG tablet Take 650 mg by mouth 2 (two) times daily.   Yes [provider]  tamsulosin (FLOMAX) 0.4 MG CAPS capsule TAKE 1 CAPSULE BY MOUTH ONCE DAILY AFTERSUPPER Patient taking differently: Take 0.4 mg by mouth daily after supper.  01/16/18  Yes Birdie Sons, MD  torsemide (DEMADEX) 20 MG tablet Take 2 tablets (40 mg total) by mouth 2 (two) times daily. 02/25/18  Yes Salary, Avel Peace, MD  traMADol (ULTRAM) 50 MG tablet Take 1 tablet (50 mg total) by mouth every 6 (six) hours as needed. 12/07/17  Yes Algernon Huxley, MD  traZODone (DESYREL) 50 MG tablet Take 0.5-1 tablets (25-50 mg total) by mouth at bedtime as needed for sleep. 03/14/18  Yes Mar Daring, PA-C  Blood Glucose Monitoring Suppl (ONE TOUCH ULTRA 2) w/Device KIT Use to check gluocose daily for type 2 diabetes 12/23/17   Birdie Sons, MD  glucose blood test strip Contour Ascensia test strips and lancets Use to check blood sugar daily for type 2 diabetes, E11.9. 12/22/17   Birdie Sons, MD      PHYSICAL EXAMINATION:   VITAL SIGNS: Blood pressure 111/60, pulse 62, temperature (!) 97.5 F (36.4 C), temperature source Oral, resp. rate 18, height 5' 6"  (1.676 m), weight 78.4 kg, SpO2 99 %.  GENERAL:  72 y.o.-year-old patient lying in the bed with no acute distress.  EYES: Pupils equal, round, reactive to light and accommodation. No scleral icterus. Extraocular muscles intact.  HEENT: Head atraumatic, normocephalic. Oropharynx and nasopharynx clear.  NECK:  Supple, no jugular venous distention. No thyroid enlargement, no tenderness.  LUNGS: Decreased breath sounds bilaterally, bibasilar crepitations heard. No use of accessory muscles of respiration.  CARDIOVASCULAR: S1, S2 normal. No murmurs, rubs, or gallops.  ABDOMEN: Soft, nontender, nondistended. Bowel sounds present. No organomegaly or mass.  EXTREMITIES: 1 plus pedal edema, no cyanosis, or clubbing.  NEUROLOGIC: Cranial nerves II through XII are intact. Muscle strength 5/5 in all extremities. Sensation intact. Gait not checked.  PSYCHIATRIC: The patient is alert and oriented x 3.  SKIN: No obvious rash, lesion, or ulcer.   LABORATORY PANEL:   CBC Recent Labs  Lab 03/12/18 0416 03/13/18 0458 03/17/18 0434  WBC 7.4 8.0 8.4  HGB 8.8* 8.2* 9.6*  HCT 28.1* 27.1* 31.1*  PLT 325 292 283  MCV 90.6 90.3 89.9  MCH 28.4 27.3 27.7  MCHC 31.3 30.3 30.9  RDW 14.6 14.5 14.3  LYMPHSABS  --   --  1.8  MONOABS  --   --  1.1*  EOSABS  --   --  0.3  BASOSABS  --   --  0.1   ------------------------------------------------------------------------------------------------------------------  Chemistries  Recent Labs  Lab 03/11/18 0356 03/11/18 0540 03/12/18 0416 03/13/18 0458 03/17/18 0434  NA 140  --  137 137 136  K 4.2  --  4.6 4.5 4.6  CL 106  --  103 101 99   CO2 26  --  25 28 29   GLUCOSE 132*  --  145* 127* 182*  BUN 48*  --  54* 37* 30*  CREATININE 3.68*  --  4.19* 3.42* 3.02*  CALCIUM 8.1*  --  8.4* 8.2* 8.0*  MG  --  1.9 2.0  --   --    ------------------------------------------------------------------------------------------------------------------  estimated creatinine clearance is 21.1 mL/min (A) (by C-G formula based on SCr of 3.02 mg/dL (H)). ------------------------------------------------------------------------------------------------------------------ No results for input(s): TSH, T4TOTAL, T3FREE, THYROIDAB in the last 72 hours.  Invalid input(s): FREET3   Coagulation profile No results for input(s): INR, PROTIME in the last 168 hours. ------------------------------------------------------------------------------------------------------------------- No results for input(s): DDIMER in the last 72 hours. -------------------------------------------------------------------------------------------------------------------  Cardiac Enzymes Recent Labs  Lab 03/17/18 0434  TROPONINI 0.06*   ------------------------------------------------------------------------------------------------------------------ Invalid input(s): POCBNP  ---------------------------------------------------------------------------------------------------------------  Urinalysis    Component Value Date/Time   COLORURINE YELLOW (A) 07/10/2017 1818   APPEARANCEUR CLEAR (A) 07/10/2017 1818   APPEARANCEUR Clear 04/01/2014 1715   LABSPEC 1.011 07/10/2017 1818   LABSPEC 1.008 04/01/2014 1715   PHURINE 6.0 07/10/2017 1818   GLUCOSEU 150 (A) 07/10/2017 1818   GLUCOSEU 50 mg/dL 04/01/2014 1715   HGBUR NEGATIVE 07/10/2017 1818   BILIRUBINUR NEGATIVE 07/10/2017 1818   BILIRUBINUR negative 07/03/2017 1157   BILIRUBINUR Negative 04/01/2014 [place] 07/10/2017 1818   PROTEINUR 100 (A) 07/10/2017 1818   UROBILINOGEN 0.2 07/03/2017 1157    NITRITE NEGATIVE 07/10/2017 1818   LEUKOCYTESUR NEGATIVE 07/10/2017 1818   LEUKOCYTESUR Negative 04/01/2014 1715     RADIOLOGY: Dg Chest 2 View  Result Date: 03/17/2018 CLINICAL DATA:  Shortness of breath. EXAM: CHEST - 2 VIEW COMPARISON:  Radiographs 03/10/2018, additional priors. Chest CT 07/10/2017 FINDINGS: Right dialysis catheter tip in the mid SVC. Post median sternotomy and CABG. Unchanged cardiomegaly. Pulmonary edema appears similar to prior exam. Small right pleural effusion is unchanged. Small left pleural effusion has increased. No pneumothorax or focal airspace disease. No acute osseous abnormalities. IMPRESSION: Cardiomegaly with pulmonary edema and small bilateral pleural effusions. The left pleural effusion has increased from radiograph 1 week ago, findings are otherwise unchanged. Electronically Signed   By: Keith Rake M.D.   On: 03/17/2018 05:36   Ct Angio Chest Pe W And/or Wo Contrast  Result Date: 03/17/2018 CLINICAL DATA:  Chest pain, complex, intermediate/high prob of ACS/PE/AAS. EXAM: CT ANGIOGRAPHY CHEST WITH CONTRAST TECHNIQUE: Multidetector CT imaging of the chest was performed using the standard protocol during bolus administration of intravenous contrast. Multiplanar CT image reconstructions and MIPs were obtained to evaluate the vascular anatomy. CONTRAST:  103m OMNIPAQUE IOHEXOL 350 MG/ML SOLN COMPARISON:  Radiographs earlier this day. Chest CT 07/10/2017. Lung bases from abdominal CT 8 days ago 03/09/2018 FINDINGS: Cardiovascular: There are no filling defects within the pulmonary arteries to suggest pulmonary embolus. Left basilar subsegmental pulmonary arteries are partially obscured by cardiac motion artifact. Right internal jugular dialysis catheter tip at the atrial caval junction. Multi chamber cardiomegaly. Contrast refluxes into the hepatic veins and IVC. Post CABG with calcification of native coronary arteries. Atherosclerosis of the thoracic aorta without  aneurysm. Cannot assess for dissection given phase of contrast. No pericardial effusion. Mediastinum/Nodes: Shotty mediastinal and bilateral hilar lymph nodes. Fluid level in the mid esophagus. No visualized thyroid nodule. Lungs/Pleura: Small to moderate bilateral pleural effusions, increased on the left over the past 8 days. Adjacent compressive atelectasis. Minimal septal thickening. Mild emphysema. No confluent airspace disease to suggest pneumonia. Upper Abdomen: Contrast refluxing into the hepatic veins and IVC. Musculoskeletal: Prior median sternotomy. There are no acute or suspicious osseous abnormalities. Multilevel degenerative change in the spine. Review of the MIP images confirms the above findings. IMPRESSION: 1. No pulmonary embolus. 2. Small to moderate bilateral pleural effusions, increased on the left over the past 8 days. Adjacent compressive atelectasis. 3. Multi chamber cardiomegaly. Contrast  refluxing into the hepatic veins and IVC consistent with elevated right heart pressures. Mild pulmonary edema. Aortic Atherosclerosis (ICD10-I70.0) and Emphysema (ICD10-J43.9). Electronically Signed   By: Keith Rake M.D.   On: 03/17/2018 06:51    EKG: Orders placed or performed during the hospital encounter of 03/17/18  . EKG 12-Lead  . EKG 12-Lead  . ED EKG  . ED EKG  . ED EKG  . ED EKG  . EKG 12-Lead  . EKG 12-Lead    IMPRESSION AND PLAN:  72 year old elderly male patient with history of chronic congestive heart failure, coronary artery disease, end-stage renal disease on dialysis, hypertension, hyperlipidemia, melanoma presented to the eminence room for shortness of breath  -Acute pulmonary edema Dialysis to remove excess fluid  -Acute on chronic heart failure exacerbation Medical management Check echocardiogram Resume torsemide for diuresis Dialysis to remove excess fluid Serial troponin and monitor on telemetry Cardiology evaluation Input output chart and daily body  weights  -Coronary artery disease Continue aspirin, Plavix and statin medication  -End-stage renal disease Nephrology consult for dialysis  -DVT prophylaxis subcu heparin  All the records are reviewed and case discussed with ED provider. Management plans discussed with the patient, family and they are in agreement.  CODE STATUS:Full code Code Status History    Date Active Date Inactive Code Status Order ID Comments User Context   03/09/2018 0233 03/13/2018 1726 Full Code [id number]  Arta Silence, MD Inpatient   02/24/2018 0450 02/25/2018 1419 Full Code [id number]  Amelia Jo, MD Inpatient   07/17/2017 0059 07/18/2017 1826 Full Code [id number]  Amelia Jo, MD Inpatient   06/10/2017 1610 06/14/2017 1308 Full Code [id number]  Henreitta Leber, MD Inpatient   05/25/2017 0024 05/30/2017 1325 Full Code [id number]  Lance Coon, MD Inpatient   11/14/2016 0938 11/14/2016 1442 Full Code [id number]  Algernon Huxley, MD Inpatient   10/31/2016 1121 10/31/2016 1817 Full Code [id number]  Algernon Huxley, MD Inpatient   10/18/2016 1357 10/18/2016 1850 Full Code [id number]  Isaias Cowman, MD Inpatient       TOTAL TIME TAKING CARE OF THIS PATIENT: 54 minutes.    Saundra Shelling M.D on 03/17/2018 at 10:26 AM  Between 7am to 6pm - Pager - [phone]  After 6pm go to [email or web address] - password [email or web address] [facility]  Office  [phone]  CC: Primary care physician; Birdie Sons, MD

## 2018-03-17 NOTE — ED Notes (Signed)
Critical troponin of 0.06 called from lab. Dr. Owens Shark notified.

## 2018-03-17 NOTE — ED Notes (Signed)
Patient transported to X-ray 

## 2018-03-17 NOTE — ED Notes (Signed)
Pt requesting additional morphine.

## 2018-03-17 NOTE — ED Provider Notes (Signed)
[facility] Emergency Department Provider Note    First MD Initiated Contact with Patient 03/17/18 0430     (approximate)  I have reviewed the triage vital signs and the nursing notes.   HISTORY  Chief Complaint Shortness of Breath    HPI Thomas [name] is a 72 y.o. male with below list of chronic medical conditions including CHF last reported EF 20%, hypertension hyperlipidemia diabetes CAD with status post stent placement, end-stage renal disease dialysis Monday Wednesday Friday presents to the emergency department with central chest tightness and dyspnea that has been progressive since Tuesday.   Past Medical History:  Diagnosis Date  . Allergy   . Arthritis   . CAD (coronary artery disease)   . Chronic kidney disease   . Diabetes mellitus without complication ([place])   . Dyspnea   . Erosive esophagitis   . GERD (gastroesophageal reflux disease)   . Gouty arthropathy   . History of colonic polyps   . History of kidney stones   . History of MRSA infection   . Hyperkalemia   . Hyperlipidemia   . Hypertension   . Melanoma ([place])   . Myocardial infarction ([place]) 1986  . Peripheral vascular disease ([place])   . 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 Trigger finger of left hand   . Ureteral tumor     Patient Active Problem List   Diagnosis Date Noted  . Acute on chronic systolic CHF (congestive heart failure) ([place]) 03/09/2018  . Acute respiratory failure ([place]) 02/24/2018  . PAD (peripheral artery disease) ([place]) 11/17/2017  . Chest pain 07/17/2017  . Emphysema lung ([place]) 07/17/2017  . Acute on chronic systolic CHF (congestive heart failure), NYHA class 2 ([place]) 06/22/2017  . CKD (chronic kidney disease), stage IV ([place]) 06/20/2017  . Systolic CHF ([place]) 63/87/5643  . Acute on chronic renal failure ([place]) 05/24/2017  . CAD (coronary artery disease) of artery bypass graft 10/25/2016  . Abnormal ankle brachial index (ABI)  10/13/2016  . Intermittent claudication ([place]) 09/30/2016  . PVC (premature ventricular contraction) 09/30/2016  . Hyperlipidemia 09/30/2016  . Aortic atherosclerosis ([place]) 06/17/2016  . Duodenitis 11/24/2014  . Erosive esophagitis 11/24/2014  . Personal history of methicillin resistant Staphylococcus aureus 11/24/2014  . High potassium 11/24/2014  . Melanoma of skin ([place]) 11/24/2014  . Acquired trigger finger 11/24/2014  . 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 DM (diabetes mellitus), type 2 with renal complications ([place]) 32/95/1884  . History of smoking 30 or more pack years 08/26/2009  . Allergic rhinitis 08/06/2006  . 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 History of colon polyps 05/09/2003  . 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 Essential (primary) hypertension 06/09/1995    Past Surgical History:  Procedure Laterality Date  . A/V SHUNT INTERVENTION Left 03/12/2018   Procedure: A/V SHUNT INTERVENTION;  Surgeon: Algernon Huxley, MD;  Location: [facility];  Service: Cardiovascular;  Laterality: Left;  . AV FISTULA PLACEMENT Left 12/07/2017   Procedure: ARTERIOVENOUS (AV) FISTULA CREATION ( BRACHIOBASILIC STAGE );  Surgeon: Algernon Huxley, MD;  Location: [facility];  Service: Vascular;  Laterality: Left;  . CATARACT EXTRACTION    . CORONARY ARTERY BYPASS GRAFT  1996  . CORONARY STENT INTERVENTION N/A 05/29/2017   Procedure: CORONARY STENT INTERVENTION;  Surgeon: Isaias Cowman, MD;  Location: [facility] [facility];  Service: Cardiovascular;  Laterality: N/A;  . EXCISION Ureteral Tumor     (  benign) Dr. Quillian Quince  . EYE SURGERY Bilateral    Cataract Extraction with IOL  . LEFT HEART CATH AND CORONARY ANGIOGRAPHY N/A 10/18/2016   Procedure: Left Heart Cath and Coronary Angiography;  Surgeon: Isaias Cowman, MD;  Location: [facility];  Service: Cardiovascular;  Laterality: N/A;  . LEFT HEART CATH AND CORONARY  ANGIOGRAPHY N/A 05/29/2017   Procedure: LEFT HEART CATH AND CORONARY ANGIOGRAPHY;  Surgeon: Isaias Cowman, MD;  Location: [facility];  Service: Cardiovascular;  Laterality: N/A;  . LOWER EXTREMITY ANGIOGRAPHY Right 10/31/2016   Procedure: Lower Extremity Angiography;  Surgeon: Algernon Huxley, MD;  Location: [facility];  Service: Cardiovascular;  Laterality: Right;  . LOWER EXTREMITY ANGIOGRAPHY Left 11/14/2016   Procedure: Lower Extremity Angiography;  Surgeon: Algernon Huxley, MD;  Location: [facility];  Service: Cardiovascular;  Laterality: Left;  . LOWER EXTREMITY INTERVENTION  11/14/2016   Procedure: Lower Extremity Intervention;  Surgeon: Algernon Huxley, MD;  Location: [place];  Service: Cardiovascular;;  . RETINAL DETACHMENT SURGERY Right November 2107    Prior to Admission medications   Medication Sig Start Date End Date Taking? Authorizing Provider  acetaminophen (TYLENOL) 500 MG tablet Take 500 mg by mouth every 8 (eight) hours as needed for mild pain or headache.   Yes [provider]  aspirin EC 81 MG tablet Take 81 mg by mouth daily.   Yes [provider]  atorvastatin (LIPITOR) 80 MG tablet TAKE 1 TABLET BY MOUTH ONCE DAILY Patient taking differently: daily at 6 PM.  08/19/17  Yes Fisher, Kirstie Peri, MD  calcium acetate (PHOSLO) 667 MG capsule Take 1 capsule (667 mg total) by mouth 3 (three) times daily with meals. 03/13/18  Yes Mayo, Pete Pelt, MD  clopidogrel (PLAVIX) 75 MG tablet TAKE 1 TABLET BY MOUTH ONCE DAILY Patient taking differently: Take 75 mg by mouth daily.  01/09/18  Yes Dew, Erskine Squibb, MD  insulin degludec (TRESIBA FLEXTOUCH) 100 UNIT/ML SOPN FlexTouch Pen Inject 0.06 mLs (6 Units total) into the skin daily. Increase by 2 units a day if fasting glucose is over 200 03/13/18  Yes Mayo, Pete Pelt, MD  isosorbide mononitrate (IMDUR) 60 MG 24 hr tablet TAKE 1 TABLET BY MOUTH ONCE DAILY. Patient taking differently: Take 60  mg by mouth daily.  01/08/18  Yes Birdie Sons, MD  losartan (COZAAR) 25 MG tablet Take 1 tablet (25 mg total) by mouth at bedtime. 03/13/18  Yes Mayo, Pete Pelt, MD  metoprolol succinate (TOPROL-XL) 50 MG 24 hr tablet TAKE 1 TABLET BY MOUTH ONCE DAILY WITH OR IMMEDIATELY FOLLOWING A MEAL. Patient taking differently: Take 50 mg by mouth daily.  01/08/18  Yes Birdie Sons, MD  pantoprazole (PROTONIX) 40 MG tablet Take 1 tablet (40 mg total) by mouth daily. Patient taking differently: Take 40 mg by mouth 2 (two) times daily.  06/03/16  Yes Birdie Sons, MD  sodium bicarbonate 650 MG tablet Take 650 mg by mouth 2 (two) times daily.   Yes [provider]  tamsulosin (FLOMAX) 0.4 MG CAPS capsule TAKE 1 CAPSULE BY MOUTH ONCE DAILY AFTERSUPPER Patient taking differently: Take 0.4 mg by mouth daily after supper.  01/16/18  Yes Birdie Sons, MD  torsemide (DEMADEX) 20 MG tablet Take 2 tablets (40 mg total) by mouth 2 (two) times daily. 02/25/18  Yes Salary, Avel Peace, MD  traMADol (ULTRAM) 50 MG tablet Take 1 tablet (50 mg total) by mouth  every 6 (six) hours as needed. 12/07/17  Yes Algernon Huxley, MD  traZODone (DESYREL) 50 MG tablet Take 0.5-1 tablets (25-50 mg total) by mouth at bedtime as needed for sleep. 03/14/18  Yes Mar Daring, PA-C  Blood Glucose Monitoring Suppl (ONE TOUCH ULTRA 2) w/Device KIT Use to check gluocose daily for type 2 diabetes 12/23/17   Birdie Sons, MD  glucose blood test strip Contour Ascensia test strips and lancets Use to check blood sugar daily for type 2 diabetes, E11.9. 12/22/17   Birdie Sons, MD    Allergies Ciprofloxacin; Trulicity [dulaglutide]; and Hydralazine  Family History  Problem Relation Age of Onset  . Alzheimer's disease Mother   . Alzheimer's disease Brother   . Lung cancer Paternal Grandfather     Social History Social History   Tobacco Use  . Smoking status: Former Smoker    Packs/day: 1.00    Years: 51.00     Pack years: 51.00    Types: Cigarettes    Last attempt to quit: 06/21/2017    Years since quitting: 0.7  . Smokeless tobacco: Never Used  . Tobacco comment: started smoking at age [age]. Smoked for 50 years  Substance Use Topics  . Alcohol use: Yes    Alcohol/week: 0.0 standard drinks    Frequency: Never    Comment: 1 every 6 months  . Drug use: No    Review of Systems Constitutional: No fever/chills Eyes: No visual changes. ENT: No sore throat. Cardiovascular: Denies chest pain. Respiratory: Denies shortness of breath. Gastrointestinal: No abdominal pain.  No nausea, no vomiting.  No diarrhea.  No constipation. Genitourinary: Negative for dysuria. Musculoskeletal: Negative for neck pain.  Negative for back pain. Integumentary: Negative for rash. Neurological: Negative for headaches, focal weakness or numbness.  ____________________________________________   PHYSICAL EXAM:  VITAL SIGNS: ED Triage Vitals  Enc Vitals Group     BP 03/17/18 0428 130/74     Pulse Rate 03/17/18 0428 85     Resp 03/17/18 0428 18     Temp 03/17/18 0432 (!) 97.4 F (36.3 C)     Temp Source 03/17/18 0432 Oral     SpO2 03/17/18 0428 98 %     Weight 03/17/18 0430 76.7 kg (169 lb)     Height 03/17/18 0430 1.676 m (5' 6" )     Head Circumference --      Peak Flow --      Pain Score 03/17/18 0430 0     Pain Loc --      Pain Edu? --      Excl. in [place]? --     Constitutional: Alert and oriented. Well appearing and in no acute distress. Eyes: Conjunctivae are normal.  Head: Atraumatic. Mouth/Throat: Mucous membranes are moist.  Oropharynx non-erythematous. Neck: No stridor.   Cardiovascular: Normal rate, regular rhythm. Good peripheral circulation. Grossly normal heart sounds. Respiratory: Normal respiratory effort.  No retractions.  Bibasilar rhonchi Gastrointestinal: Soft and nontender. No distention.   Musculoskeletal: No lower extremity tenderness nor edema. No gross deformities of  extremities. Neurologic:  Normal speech and language. No gross focal neurologic deficits are appreciated.  Skin:  Skin is warm, dry and intact. No rash noted. Psychiatric: Mood and affect are normal. Speech and behavior are normal.  ____________________________________________   LABS (all labs ordered are listed, but only abnormal results are displayed)  Labs Reviewed  CBC WITH DIFFERENTIAL/PLATELET - Abnormal; Notable for the following components:      Result Value  RBC 3.46 (*)    Hemoglobin 9.6 (*)    HCT 31.1 (*)    Monocytes Absolute 1.1 (*)    All other components within normal limits  BASIC METABOLIC PANEL - Abnormal; Notable for the following components:   Glucose, Bld 182 (*)    BUN 30 (*)    Creatinine, Ser 3.02 (*)    Calcium 8.0 (*)    GFR calc non Af Amer 19 (*)    GFR calc Af Amer 22 (*)    All other components within normal limits  BRAIN NATRIURETIC PEPTIDE - Abnormal; Notable for the following components:   B Natriuretic Peptide 3,015.0 (*)    All other components within normal limits  TROPONIN I - Abnormal; Notable for the following components:   Troponin I 0.06 (*)    All other components within normal limits   ____________________________________________  EKG  ED ECG REPORT I, [place] N BROWN, the attending physician, personally viewed and interpreted this ECG.   Date: 03/17/2018  EKG Time: 4:30 AM  Rate: 78  Rhythm: Sinus rhythm with PVC  Axis: Leftward axis  Intervals: Irregular RR  ST&T Change: None  ED ECG REPORT I, [place] N BROWN, the attending physician, personally viewed and interpreted this ECG.   Date: 03/17/2018  EKG Time: 5:32 AM  Rate: 89  Rhythm:   Axis: Axis  Intervals: Regular RR interval  ST&T Change: None  ____________________________________________  RADIOLOGY I, [place] N BROWN, personally viewed and evaluated these images (plain radiographs) as part of my medical decision making, as well as reviewing the written  report by the radiologist.  ED MD interpretation: The megaly with pulmonary edema and bilateral pleural effusions.  Official radiology report(s): Dg Chest 2 View  Result Date: 03/17/2018 CLINICAL DATA:  Shortness of breath. EXAM: CHEST - 2 VIEW COMPARISON:  Radiographs 03/10/2018, additional priors. Chest CT 07/10/2017 FINDINGS: Right dialysis catheter tip in the mid SVC. Post median sternotomy and CABG. Unchanged cardiomegaly. Pulmonary edema appears similar to prior exam. Small right pleural effusion is unchanged. Small left pleural effusion has increased. No pneumothorax or focal airspace disease. No acute osseous abnormalities. IMPRESSION: Cardiomegaly with pulmonary edema and small bilateral pleural effusions. The left pleural effusion has increased from radiograph 1 week ago, findings are otherwise unchanged. Electronically Signed   By: Keith Rake M.D.   On: 03/17/2018 05:36   Ct Angio Chest Pe W And/or Wo Contrast  Result Date: 03/17/2018 CLINICAL DATA:  Chest pain, complex, intermediate/high prob of ACS/PE/AAS. EXAM: CT ANGIOGRAPHY CHEST WITH CONTRAST TECHNIQUE: Multidetector CT imaging of the chest was performed using the standard protocol during bolus administration of intravenous contrast. Multiplanar CT image reconstructions and MIPs were obtained to evaluate the vascular anatomy. CONTRAST:  29m OMNIPAQUE IOHEXOL 350 MG/ML SOLN COMPARISON:  Radiographs earlier this day. Chest CT 07/10/2017. Lung bases from abdominal CT 8 days ago 03/09/2018 FINDINGS: Cardiovascular: There are no filling defects within the pulmonary arteries to suggest pulmonary embolus. Left basilar subsegmental pulmonary arteries are partially obscured by cardiac motion artifact. Right internal jugular dialysis catheter tip at the atrial caval junction. Multi chamber cardiomegaly. Contrast refluxes into the hepatic veins and IVC. Post CABG with calcification of native coronary arteries. Atherosclerosis of the  thoracic aorta without aneurysm. Cannot assess for dissection given phase of contrast. No pericardial effusion. Mediastinum/Nodes: Shotty mediastinal and bilateral hilar lymph nodes. Fluid level in the mid esophagus. No visualized thyroid nodule. Lungs/Pleura: Small to moderate bilateral pleural effusions, increased on the left over  the past 8 days. Adjacent compressive atelectasis. Minimal septal thickening. Mild emphysema. No confluent airspace disease to suggest pneumonia. Upper Abdomen: Contrast refluxing into the hepatic veins and IVC. Musculoskeletal: Prior median sternotomy. There are no acute or suspicious osseous abnormalities. Multilevel degenerative change in the spine. Review of the MIP images confirms the above findings. IMPRESSION: 1. No pulmonary embolus. 2. Small to moderate bilateral pleural effusions, increased on the left over the past 8 days. Adjacent compressive atelectasis. 3. Multi chamber cardiomegaly. Contrast refluxing into the hepatic veins and IVC consistent with elevated right heart pressures. Mild pulmonary edema. Aortic Atherosclerosis (ICD10-I70.0) and Emphysema (ICD10-J43.9). Electronically Signed   By: Keith Rake M.D.   On: 03/17/2018 06:51      Procedures   ____________________________________________   INITIAL IMPRESSION / ASSESSMENT AND PLAN / ED COURSE  As part of my medical decision making, I reviewed the following data within the electronic MEDICAL RECORD NUMBER   72 year old male presenting with above-stated history and physical exam secondary to chest pain and dyspnea.  Concern for possible cardiac etiology including CHF exacerbation, NSTEMI.  Also concern of possibly pulmonary emboli as patient has had multiple IV intervention including dialysis catheter.  Laboratory data revealed an elevated troponin 0 0.06 EKG revealed no evidence of ischemia or infarction.  CT scan of the chest revealed no pulmonary emboli however bilateral pleural effusions noted  effusion on the left worsened in comparison to a week ago.  Dr. Candiss Norse neurologist paged.  While in the emergency department patient complained of worsening chest pain and as such repeat EKG was performed which revealed no evidence of infarction.  Patient was given IV morphine and on reevaluation patient states that pain is much improved.  Patient discussed with Dr. Marcille Blanco for hospital admission for further evaluation and management of chest pain and shortness of breath with bilateral pleural effusions and pulmonary edema    ____________________________________________  FINAL CLINICAL IMPRESSION(S) / ED DIAGNOSES  Final diagnoses:  Pleural effusion  Chest pain, unspecified type     MEDICATIONS GIVEN DURING THIS VISIT:  Medications  albuterol (PROVENTIL) (2.5 MG/3ML) 0.083% nebulizer solution 5 mg (5 mg Nebulization Given 03/17/18 0444)  morphine 2 MG/ML injection 2 mg (2 mg Intravenous Given 03/17/18 0541)  iohexol (OMNIPAQUE) 350 MG/ML injection 60 mL (60 mLs Intravenous Contrast Given 03/17/18 0602)  morphine 2 MG/ML injection 2 mg (2 mg Intravenous Given 03/17/18 6553)     ED Discharge Orders    None       Note:  This document was prepared using Dragon voice recognition software and may include unintentional dictation errors.    Gregor Hams, MD 03/17/18 [phone]

## 2018-03-17 NOTE — Progress Notes (Signed)
Pre hd 

## 2018-03-17 NOTE — Progress Notes (Signed)
Advanced care plan.  Purpose of the Encounter: CODE STATUS  Parties in Attendance: Patient and family  Patient's Decision Capacity: Good  Subjective/Patient's story: Presented to the emergency room for shortness of breath and fluid overload   Objective/Medical story Patient needs dialysis BNP is elevated Cardiology evaluation and echocardiogram   Goals of care determination:  Advance care directives goals of care and treatment plan discussed Dialysis plan discussed Patient wants everything done which includes CPR, intubation ventilator the need arises   CODE STATUS: Full code   Time spent discussing advanced care planning: 16 minutes

## 2018-03-17 NOTE — ED Notes (Signed)
Attempted Report at this time. RN will monitor and wait for return call from floor RN

## 2018-03-17 NOTE — Plan of Care (Signed)
  Problem: Activity: Goal: Risk for activity intolerance will decrease Outcome: Progressing   Problem: Elimination: Goal: Will not experience complications related to urinary retention Outcome: Progressing   Problem: Safety: Goal: Ability to remain free from injury will improve Outcome: Progressing   Problem: Skin Integrity: Goal: Risk for impaired skin integrity will decrease Outcome: Progressing   Problem: Activity: Goal: Capacity to carry out activities will improve Outcome: Progressing   Problem: Cardiac: Goal: Ability to achieve and maintain adequate cardiopulmonary perfusion will improve Outcome: Progressing

## 2018-03-17 NOTE — Consult Note (Signed)
Reason for Consult: Congestive heart failure shortness of breath Referring Physician: Dr. Estanislado Pandy hospitalist Cardiologist Dr. Duayne Cal Thomas [name] is an 72 y.o. male.  HPI: Patient is a 72 year old white male with history of known coronary disease end-stage renal disease on dialysis diabetes GERD hypertension hyperlipidemia presented with volume overload.  Patient has been getting dialysis recently but had difficulty with his new dialysis shunt in his left arm patient had to have a permacath placed on his right chest for dialysis treatment management.  Patient went to dialysis but was unable to complete the full dialysis treatment patient had shortness of breath volume overload and congestion patient was then admitted for further assessment he had some right-sided vague abdominal and chest discomfort thought to be atypical  Past Medical History:  Diagnosis Date  . Allergy   . Arthritis   . CAD (coronary artery disease)   . Chronic kidney disease   . Diabetes mellitus without complication ([place])   . Dyspnea   . Erosive esophagitis   . GERD (gastroesophageal reflux disease)   . Gouty arthropathy   . History of colonic polyps   . History of kidney stones   . History of MRSA infection   . Hyperkalemia   . Hyperlipidemia   . Hypertension   . Melanoma ([place])   . Myocardial infarction ([place]) 1986  . Peripheral vascular disease ([place])   . 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 Trigger finger of left hand   . Ureteral tumor     Past Surgical History:  Procedure Laterality Date  . A/V SHUNT INTERVENTION Left 03/12/2018   Procedure: A/V SHUNT INTERVENTION;  Surgeon: Algernon Huxley, MD;  Location: [place] [facility];  Service: Cardiovascular;  Laterality: Left;  . AV FISTULA PLACEMENT Left 12/07/2017   Procedure: ARTERIOVENOUS (AV) FISTULA CREATION ( BRACHIOBASILIC STAGE );  Surgeon: Algernon Huxley, MD;  Location: [facility];  Service: Vascular;  Laterality: Left;  .  CATARACT EXTRACTION    . CORONARY ARTERY BYPASS GRAFT  1996  . CORONARY STENT INTERVENTION N/A 05/29/2017   Procedure: CORONARY STENT INTERVENTION;  Surgeon: Isaias Cowman, MD;  Location: [place] [facility];  Service: Cardiovascular;  Laterality: N/A;  . EXCISION Ureteral Tumor     (benign) Dr. Quillian Quince  . EYE SURGERY Bilateral    Cataract Extraction with IOL  . LEFT HEART CATH AND CORONARY ANGIOGRAPHY N/A 10/18/2016   Procedure: Left Heart Cath and Coronary Angiography;  Surgeon: Isaias Cowman, MD;  Location: [place] [facility];  Service: Cardiovascular;  Laterality: N/A;  . LEFT HEART CATH AND CORONARY ANGIOGRAPHY N/A 05/29/2017   Procedure: LEFT HEART CATH AND CORONARY ANGIOGRAPHY;  Surgeon: Isaias Cowman, MD;  Location: [place] [facility];  Service: Cardiovascular;  Laterality: N/A;  . LOWER EXTREMITY ANGIOGRAPHY Right 10/31/2016   Procedure: Lower Extremity Angiography;  Surgeon: Algernon Huxley, MD;  Location: [place] [facility];  Service: Cardiovascular;  Laterality: Right;  . LOWER EXTREMITY ANGIOGRAPHY Left 11/14/2016   Procedure: Lower Extremity Angiography;  Surgeon: Algernon Huxley, MD;  Location: [place] [facility];  Service: Cardiovascular;  Laterality: Left;  . LOWER EXTREMITY INTERVENTION  11/14/2016   Procedure: Lower Extremity Intervention;  Surgeon: Algernon Huxley, MD;  Location: [place] [facility];  Service: Cardiovascular;;  . RETINAL DETACHMENT SURGERY Right November 2107    Family History  Problem Relation Age of Onset  . Alzheimer's disease Mother   . Alzheimer's disease Brother   .  Lung cancer Paternal Grandfather     Social History:  reports that he quit smoking about 8 months ago. His smoking use included cigarettes. He has a 51.00 pack-year smoking history. He has never used smokeless tobacco. He reports that he drinks alcohol. He reports that he does not use drugs.  Allergies:  Allergies  Allergen Reactions  . Ciprofloxacin Itching and  Swelling    Facial swelling   . Trulicity [Dulaglutide]     Bloating  . Hydralazine Nausea And Vomiting    Medications: I have reviewed the patient's current medications.  Results for orders placed or performed during the hospital encounter of 03/17/18 (from the past 48 hour(s))  CBC with Differential     Status: Abnormal   Collection Time: 03/17/18  4:34 AM  Result Value Ref Range   WBC 8.4 4.0 - 10.5 K/uL   RBC 3.46 (L) 4.22 - 5.81 MIL/uL   Hemoglobin 9.6 (L) 13.0 - 17.0 g/dL   HCT 31.1 (L) 39.0 - 52.0 %   MCV 89.9 80.0 - 100.0 fL   MCH 27.7 26.0 - 34.0 pg   MCHC 30.9 30.0 - 36.0 g/dL   RDW 14.3 11.5 - 15.5 %   Platelets 283 150 - 400 K/uL   nRBC 0.0 0.0 - 0.2 %   Neutrophils Relative % 61 %   Neutro Abs 5.1 1.7 - 7.7 K/uL   Lymphocytes Relative 21 %   Lymphs Abs 1.8 0.7 - 4.0 K/uL   Monocytes Relative 13 %   Monocytes Absolute 1.1 (H) 0.1 - 1.0 K/uL   Eosinophils Relative 3 %   Eosinophils Absolute 0.3 0.0 - 0.5 K/uL   Basophils Relative 1 %   Basophils Absolute 0.1 0.0 - 0.1 K/uL   Immature Granulocytes 1 %   Abs Immature Granulocytes 0.06 0.00 - 0.07 K/uL    Comment: Performed at [facility], [address] [place], [place] [address]  Basic metabolic panel     Status: Abnormal   Collection Time: 03/17/18  4:34 AM  Result Value Ref Range   Sodium 136 135 - 145 mmol/L   Potassium 4.6 3.5 - 5.1 mmol/L   Chloride 99 98 - 111 mmol/L   CO2 29 22 - 32 mmol/L   Glucose, Bld 182 (H) 70 - 99 mg/dL   BUN 30 (H) 8 - 23 mg/dL   Creatinine, Ser 3.02 (H) 0.61 - 1.24 mg/dL   Calcium 8.0 (L) 8.9 - 10.3 mg/dL   GFR calc non Af Amer 19 (L) >60 mL/min   GFR calc Af Amer 22 (L) >60 mL/min   Anion gap 8 5 - 15    Comment: Performed at [facility], [address] [place], [place] [address]  Brain natriuretic peptide     Status: Abnormal   Collection Time: 03/17/18  4:34 AM  Result Value Ref Range   B Natriuretic Peptide 3,015.0 (H) 0.0 - 100.0 pg/mL     Comment: Performed at [facility], [address] [place], [place] [address]  Troponin I - Once     Status: Abnormal   Collection Time: 03/17/18  4:34 AM  Result Value Ref Range   Troponin I 0.06 (HH) <0.03 ng/mL    Comment: CRITICAL RESULT CALLED TO, READ BACK BY AND VERIFIED WITH APRIL BRUMGARD  AT 0533 ON 03/17/18 BY SNJ Performed at [facility], [address], [place], [place] [address]   Troponin I - Now Then Q6H     Status:  Abnormal   Collection Time: 03/17/18 10:48 AM  Result Value Ref Range   Troponin I 0.05 (HH) <0.03 ng/mL    Comment: CRITICAL VALUE NOTED. VALUE IS CONSISTENT WITH PREVIOUSLY REPORTED/CALLED VALUE.PMF Performed at [facility] [place], [address] [place], [place] [address]   Glucose, capillary     Status: Abnormal   Collection Time: 03/17/18 11:46 AM  Result Value Ref Range   Glucose-Capillary 160 (H) 70 - 99 mg/dL    Dg Chest 2 View  Result Date: 03/17/2018 CLINICAL DATA:  Shortness of breath. EXAM: CHEST - 2 VIEW COMPARISON:  Radiographs 03/10/2018, additional priors. Chest CT 07/10/2017 FINDINGS: Right dialysis catheter tip in the mid SVC. Post median sternotomy and CABG. Unchanged cardiomegaly. Pulmonary edema appears similar to prior exam. Small right pleural effusion is unchanged. Small left pleural effusion has increased. No pneumothorax or focal airspace disease. No acute osseous abnormalities. IMPRESSION: Cardiomegaly with pulmonary edema and small bilateral pleural effusions. The left pleural effusion has increased from radiograph 1 week ago, findings are otherwise unchanged. Electronically Signed   By: Keith Rake M.D.   On: 03/17/2018 05:36   Ct Angio Chest Pe W And/or Wo Contrast  Result Date: 03/17/2018 CLINICAL DATA:  Chest pain, complex, intermediate/high prob of ACS/PE/AAS. EXAM: CT ANGIOGRAPHY CHEST WITH CONTRAST TECHNIQUE: Multidetector CT imaging of the chest was performed using the standard protocol  during bolus administration of intravenous contrast. Multiplanar CT image reconstructions and MIPs were obtained to evaluate the vascular anatomy. CONTRAST:  8mL OMNIPAQUE IOHEXOL 350 MG/ML SOLN COMPARISON:  Radiographs earlier this day. Chest CT 07/10/2017. Lung bases from abdominal CT 8 days ago 03/09/2018 FINDINGS: Cardiovascular: There are no filling defects within the pulmonary arteries to suggest pulmonary embolus. Left basilar subsegmental pulmonary arteries are partially obscured by cardiac motion artifact. Right internal jugular dialysis catheter tip at the atrial caval junction. Multi chamber cardiomegaly. Contrast refluxes into the hepatic veins and IVC. Post CABG with calcification of native coronary arteries. Atherosclerosis of the thoracic aorta without aneurysm. Cannot assess for dissection given phase of contrast. No pericardial effusion. Mediastinum/Nodes: Shotty mediastinal and bilateral hilar lymph nodes. Fluid level in the mid esophagus. No visualized thyroid nodule. Lungs/Pleura: Small to moderate bilateral pleural effusions, increased on the left over the past 8 days. Adjacent compressive atelectasis. Minimal septal thickening. Mild emphysema. No confluent airspace disease to suggest pneumonia. Upper Abdomen: Contrast refluxing into the hepatic veins and IVC. Musculoskeletal: Prior median sternotomy. There are no acute or suspicious osseous abnormalities. Multilevel degenerative change in the spine. Review of the MIP images confirms the above findings. IMPRESSION: 1. No pulmonary embolus. 2. Small to moderate bilateral pleural effusions, increased on the left over the past 8 days. Adjacent compressive atelectasis. 3. Multi chamber cardiomegaly. Contrast refluxing into the hepatic veins and IVC consistent with elevated right heart pressures. Mild pulmonary edema. Aortic Atherosclerosis (ICD10-I70.0) and Emphysema (ICD10-J43.9). Electronically Signed   By: Keith Rake M.D.   On: 03/17/2018  06:51    Review of Systems  Constitutional: Positive for malaise/fatigue and weight loss.  HENT: Negative.   Eyes: Negative.   Respiratory: Positive for shortness of breath.   Cardiovascular: Positive for orthopnea, leg swelling and PND.  Gastrointestinal: Negative.   Genitourinary: Negative.   Musculoskeletal: Negative.   Skin: Negative.   Neurological: Positive for weakness.  Endo/Heme/Allergies: Negative.   Psychiatric/Behavioral: Negative.    Blood pressure (!) 106/57, pulse 79, temperature 97.9 F (36.6 C), temperature source Oral, resp. rate 18, height 5\' 6"  (1.676  m), weight 76 kg, SpO2 96 %. Physical Exam  Nursing note and vitals reviewed. Constitutional: He is oriented to person, place, and time. He appears well-developed and well-nourished.  HENT:  Head: Normocephalic and atraumatic.  Eyes: Pupils are equal, round, and reactive to light. Conjunctivae and EOM are normal.  Neck: Normal range of motion. Neck supple.  Cardiovascular: Normal rate and regular rhythm.  Murmur heard. Respiratory: He is in respiratory distress.  GI: Soft. Bowel sounds are normal.  Musculoskeletal: Normal range of motion. He exhibits edema.  Neurological: He is alert and oriented to person, place, and time. He has normal reflexes.  Skin: Skin is warm.  Psychiatric: He has a normal mood and affect.    Assessment/Plan: Congestive heart failure Shortness of breath Stage renal disease on dialysis Coronary artery disease Diabetes Dyspnea History of MRSA Atypical chest pain. .  Agree with admit to telemetry Agree with rule out myocardial infarction Follow-up cardiac enzymes Echocardiogram may be helpful for assessment of left ventricular function and wall motion Agree with aggressive dialysis therapy for volume overload Continue torsemide therapy for mild diuresis Continue aggressive medical management DVT prophylaxis  Dwayne D Callwood 03/17/2018, 4:16 PM

## 2018-03-17 NOTE — ED Notes (Signed)
Cassie RN, aware of bed assigned

## 2018-03-17 NOTE — ED Notes (Signed)
Report was given to [facility] on 2A. RN will notify for transport.

## 2018-03-17 NOTE — ED Notes (Signed)
Pt is sitting on side of with family at bedside. Pt is requesting ice chips at this time, and inquiring on admission placement. EDP aware, RN will continue to monitor.

## 2018-03-17 NOTE — ED Notes (Signed)
Patient transported to CT 

## 2018-03-18 LAB — BASIC METABOLIC PANEL
Anion gap: 7 (ref 5–15)
BUN: 39 mg/dL — ABNORMAL HIGH (ref 8–23)
CO2: 29 mmol/L (ref 22–32)
Calcium: 8.2 mg/dL — ABNORMAL LOW (ref 8.9–10.3)
Chloride: 100 mmol/L (ref 98–111)
Creatinine, Ser: 3.87 mg/dL — ABNORMAL HIGH (ref 0.61–1.24)
GFR calc non Af Amer: 14 mL/min — ABNORMAL LOW (ref >60)
GFR, EST AFRICAN AMERICAN: 17 mL/min — AB (ref >60)
Glucose, Bld: 167 mg/dL — ABNORMAL HIGH (ref 70–99)
Potassium: 4.9 mmol/L (ref 3.5–5.1)
Sodium: 136 mmol/L (ref 135–145)

## 2018-03-18 LAB — GLUCOSE, CAPILLARY: Glucose-Capillary: 148 mg/dL — ABNORMAL HIGH (ref 70–99)

## 2018-03-18 MED ORDER — METOPROLOL SUCCINATE ER 25 MG PO TB24: 25.0000 mg | ORAL_TABLET | Freq: Every day | ORAL | 0 refills | Status: DC

## 2018-03-18 NOTE — Discharge Summary (Signed)
[place] [facility] [place] NAME: Thomas [name]    MR#:  [id number]  DATE OF BIRTH:  [birth year]  DATE OF ADMISSION:  03/17/2018 ADMITTING PHYSICIAN: Saundra Shelling, MD  DATE OF DISCHARGE: 03/18/2018  PRIMARY CARE PHYSICIAN: Birdie Sons, MD   ADMISSION DIAGNOSIS:  Acute pulmonary edema Acute on chronic systolic heart failure exacerbation Coronary artery disease End-stage renal disease on dialysis  DISCHARGE DIAGNOSIS:  Acute pulmonary edema Fluid overload Acute on chronic systolic heart failure exacerbation Coronary artery disease ESRD on dialysis  SECONDARY DIAGNOSIS:   Past Medical History:  Diagnosis Date  . Allergy   . Arthritis   . CAD (coronary artery disease)   . Chronic kidney disease   . Diabetes mellitus without complication ([place])   . Dyspnea   . Erosive esophagitis   . GERD (gastroesophageal reflux disease)   . Gouty arthropathy   . History of colonic polyps   . History of kidney stones   . History of MRSA infection   . Hyperkalemia   . Hyperlipidemia   . Hypertension   . Melanoma ([place])   . Myocardial infarction ([place]) 1986  . Peripheral vascular disease ([place])   . 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 Trigger finger of left hand   . Ureteral tumor      ADMITTING HISTORY Thomas [name]  is a 72 y.o. male with a known history of coronary artery disease, end-stage renal disease started on dialysis last week, diabetes mellitus type 2, GERD, hyperlipidemia, hypertension, chronic congestive heart failure, melanoma, peripheral vascular disease presented to the emergency room for shortness of breath.  The shortness of breath has been worse for the last couple of days.  He felt congested.  He had dialysis yesterday.  No complaints of any chest discomfort.  Patient was evaluated in the emergency room his BNP was elevated chest x-ray revealed fluid overload and congestion.  Nephrology consultant was  discussed and plan was for dialysis.  Hospitalist service was consulted for further care.   HOSPITAL COURSE:  Patient was admitted to telemetry.  Serial troponins were mildly elevated secondary to demand ischemia.  Patient continued torsemide for diuresis.  Was followed up by nephrology.  Patient had dialysis and excess fluid was removed.  Shortness of breath improved.  Swelling in the lower extremities also improved after dialysis.  Patient had recent echocardiogram on 22 November which was reviewed.  Discussed with nephrology attending will discontinue oral Cozaar and decrease the dose of Toprol-XL at the time of discharge.  Patient will be discharged home and follow-up with primary care physician in the clinic.  [facility] clinic cardiology attending evaluated the patient and did not recommend any intervention.  Advised to continue medical management.  CONSULTS OBTAINED:  Treatment Team:  Murlean Iba, MD Yolonda Kida, MD  DRUG ALLERGIES:   Allergies  Allergen Reactions  . Ciprofloxacin Itching and Swelling    Facial swelling   . Trulicity [Dulaglutide]     Bloating  . Hydralazine Nausea And Vomiting    DISCHARGE MEDICATIONS:   Allergies as of 03/18/2018      Reactions   Ciprofloxacin Itching, Swelling   Facial swelling    Trulicity [dulaglutide]    Bloating   Hydralazine Nausea And Vomiting      Medication List    STOP taking these medications   losartan 25 MG tablet Commonly known as:  COZAAR     TAKE these medications  acetaminophen 500 MG tablet Commonly known as:  TYLENOL Take 500 mg by mouth every 8 (eight) hours as needed for mild pain or headache.   aspirin EC 81 MG tablet Take 81 mg by mouth daily.   atorvastatin 80 MG tablet Commonly known as:  LIPITOR TAKE 1 TABLET BY MOUTH ONCE DAILY What changed:    how much to take  how to take this  when to take this   calcium acetate 667 MG capsule Commonly known as:  PHOSLO Take 1 capsule (667 mg  total) by mouth 3 (three) times daily with meals.   clopidogrel 75 MG tablet Commonly known as:  PLAVIX TAKE 1 TABLET BY MOUTH ONCE DAILY   glucose blood test strip Contour Ascensia test strips and lancets Use to check blood sugar daily for type 2 diabetes, E11.9.   insulin degludec 100 UNIT/ML Sopn FlexTouch Pen Commonly known as:  TRESIBA Inject 0.06 mLs (6 Units total) into the skin daily. Increase by 2 units a day if fasting glucose is over 200   isosorbide mononitrate 60 MG 24 hr tablet Commonly known as:  IMDUR TAKE 1 TABLET BY MOUTH ONCE DAILY.   metoprolol succinate 25 MG 24 hr tablet Commonly known as:  TOPROL-XL Take 1 tablet (25 mg total) by mouth daily. What changed:    medication strength  See the new instructions.   ONE TOUCH ULTRA 2 w/Device Kit Use to check gluocose daily for type 2 diabetes   pantoprazole 40 MG tablet Commonly known as:  PROTONIX Take 1 tablet (40 mg total) by mouth daily. What changed:  when to take this   sodium bicarbonate 650 MG tablet Take 650 mg by mouth 2 (two) times daily.   tamsulosin 0.4 MG Caps capsule Commonly known as:  FLOMAX TAKE 1 CAPSULE BY MOUTH ONCE DAILY AFTERSUPPER What changed:  See the new instructions.   torsemide 20 MG tablet Commonly known as:  DEMADEX Take 2 tablets (40 mg total) by mouth 2 (two) times daily.   traMADol 50 MG tablet Commonly known as:  ULTRAM Take 1 tablet (50 mg total) by mouth every 6 (six) hours as needed.   traZODone 50 MG tablet Commonly known as:  DESYREL Take 0.5-1 tablets (25-50 mg total) by mouth at bedtime as needed for sleep.       Today  Patient seen and evaluated today No swelling in the legs Decreased shortness of breath No chest pain  VITAL SIGNS:  Blood pressure (!) 111/58, pulse 60, temperature 98 F (36.7 C), temperature source Oral, resp. rate 18, height _0  (1.676 m), weight 76.2 kg, SpO2 97 %.  I/O:    Intake/Output Summary (Last 24 hours) at  03/18/2018 1139 Last data filed at 03/17/2018 2239 Gross per 24 hour  Intake 240 ml  Output 2000 ml  Net -1760 ml    PHYSICAL EXAMINATION:  Physical Exam  GENERAL:  72 y.o.-year-old patient lying in the bed with no acute distress.  LUNGS: Normal breath sounds bilaterally, no wheezing, rales,rhonchi or crepitation. No use of accessory muscles of respiration.  CARDIOVASCULAR: S1, S2 normal. No murmurs, rubs, or gallops.  ABDOMEN: Soft, non-tender, non-distended. Bowel sounds present. No organomegaly or mass.  NEUROLOGIC: Moves all 4 extremities. PSYCHIATRIC: The patient is alert and oriented x 3.  SKIN: No obvious rash, lesion, or ulcer.   DATA REVIEW:   CBC Recent Labs  Lab 03/17/18 0434  WBC 8.4  HGB 9.6*  HCT 31.1*  PLT 283  Chemistries  Recent Labs  Lab 03/12/18 0416  03/18/18 0554  NA 137   < > 136  K 4.6   < > 4.9  CL 103   < > 100  CO2 25   < > 29  GLUCOSE 145*   < > 167*  BUN 54*   < > 39*  CREATININE 4.19*   < > 3.87*  CALCIUM 8.4*   < > 8.2*  MG 2.0  --   --    < > = values in this interval not displayed.    Cardiac Enzymes Recent Labs  Lab 03/17/18 2210  TROPONINI 0.09*    Microbiology Results  Results for orders placed or performed in visit on 07/03/17  Urine Culture     Status: None   Collection Time: 07/03/17 12:14 PM  Result Value Ref Range Status   Urine Culture, Routine Final report  Final   Organism ID, Bacteria Comment  Final    Comment: Mixed urogenital flora Less than 10,000 colonies/mL     RADIOLOGY:  Dg Chest 2 View  Result Date: 03/17/2018 CLINICAL DATA:  Shortness of breath. EXAM: CHEST - 2 VIEW COMPARISON:  Radiographs 03/10/2018, additional priors. Chest CT 07/10/2017 FINDINGS: Right dialysis catheter tip in the mid SVC. Post median sternotomy and CABG. Unchanged cardiomegaly. Pulmonary edema appears similar to prior exam. Small right pleural effusion is unchanged. Small left pleural effusion has increased. No  pneumothorax or focal airspace disease. No acute osseous abnormalities. IMPRESSION: Cardiomegaly with pulmonary edema and small bilateral pleural effusions. The left pleural effusion has increased from radiograph 1 week ago, findings are otherwise unchanged. Electronically Signed   By: Keith Rake M.D.   On: 03/17/2018 05:36   Ct Angio Chest Pe W And/or Wo Contrast  Result Date: 03/17/2018 CLINICAL DATA:  Chest pain, complex, intermediate/high prob of ACS/PE/AAS. EXAM: CT ANGIOGRAPHY CHEST WITH CONTRAST TECHNIQUE: Multidetector CT imaging of the chest was performed using the standard protocol during bolus administration of intravenous contrast. Multiplanar CT image reconstructions and MIPs were obtained to evaluate the vascular anatomy. CONTRAST:  31m OMNIPAQUE IOHEXOL 350 MG/ML SOLN COMPARISON:  Radiographs earlier this day. Chest CT 07/10/2017. Lung bases from abdominal CT 8 days ago 03/09/2018 FINDINGS: Cardiovascular: There are no filling defects within the pulmonary arteries to suggest pulmonary embolus. Left basilar subsegmental pulmonary arteries are partially obscured by cardiac motion artifact. Right internal jugular dialysis catheter tip at the atrial caval junction. Multi chamber cardiomegaly. Contrast refluxes into the hepatic veins and IVC. Post CABG with calcification of native coronary arteries. Atherosclerosis of the thoracic aorta without aneurysm. Cannot assess for dissection given phase of contrast. No pericardial effusion. Mediastinum/Nodes: Shotty mediastinal and bilateral hilar lymph nodes. Fluid level in the mid esophagus. No visualized thyroid nodule. Lungs/Pleura: Small to moderate bilateral pleural effusions, increased on the left over the past 8 days. Adjacent compressive atelectasis. Minimal septal thickening. Mild emphysema. No confluent airspace disease to suggest pneumonia. Upper Abdomen: Contrast refluxing into the hepatic veins and IVC. Musculoskeletal: Prior median  sternotomy. There are no acute or suspicious osseous abnormalities. Multilevel degenerative change in the spine. Review of the MIP images confirms the above findings. IMPRESSION: 1. No pulmonary embolus. 2. Small to moderate bilateral pleural effusions, increased on the left over the past 8 days. Adjacent compressive atelectasis. 3. Multi chamber cardiomegaly. Contrast refluxing into the hepatic veins and IVC consistent with elevated right heart pressures. Mild pulmonary edema. Aortic Atherosclerosis (ICD10-I70.0) and Emphysema (ICD10-J43.9). Electronically Signed   By:  Keith Rake M.D.   On: 03/17/2018 06:51    Follow up with PCP in 1 week.  Management plans discussed with the patient, family and they are in agreement.  CODE STATUS: Full code    Code Status Orders  (From admission, onward)         Start     Ordered   03/17/18 1033  Full code  Continuous     03/17/18 1033        Code Status History    Date Active Date Inactive Code Status Order ID Comments User Context   03/09/2018 0233 03/13/2018 1726 Full Code [id number]  Arta Silence, MD Inpatient   02/24/2018 0450 02/25/2018 1419 Full Code [id number]  Amelia Jo, MD Inpatient   07/17/2017 0059 07/18/2017 1826 Full Code [id number]  Amelia Jo, MD Inpatient   06/10/2017 1610 06/14/2017 1308 Full Code [id number]  Henreitta Leber, MD Inpatient   05/25/2017 0024 05/30/2017 1325 Full Code [id number]  Lance Coon, MD Inpatient   11/14/2016 0938 11/14/2016 1442 Full Code [id number]  Algernon Huxley, MD Inpatient   10/31/2016 1121 10/31/2016 1817 Full Code [id number]  Algernon Huxley, MD Inpatient   10/18/2016 1357 10/18/2016 1850 Full Code [id number]  Isaias Cowman, MD Inpatient      TOTAL TIME TAKING CARE OF THIS PATIENT ON DAY OF DISCHARGE: more than 35 minutes.   Saundra Shelling M.D on 03/18/2018 at 11:39 AM  Between 7am to 6pm - Pager - [phone]  After 6pm go to [email or web address] - password [email or web address] [email or web address]  [facility] [place] [facility]   Office  [phone]  CC: Primary care physician; Birdie Sons, MD  Note: This dictation was prepared with Dragon dictation along with smaller phrase technology. Any transcriptional errors that result from this process are unintentional.

## 2018-03-18 NOTE — Progress Notes (Signed)
RN removed IV.  Patient left in a private vehicle with his wife.  Phillis Knack, RN

## 2018-03-18 NOTE — Progress Notes (Signed)
[facility], [place] 03/18/18  Subjective:   Patient was admitted for shortness of breath and fluid overload.  Underwent ultrafiltration session yesterday where 2 L were removed.  He felt immediately better  Able to eat without nausea or vomiting Still has leg edema  Objective:  Vital signs in last 24 hours:  Temp:  [97.6 F (36.4 C)-98 F (36.7 C)] 98 F (36.7 C) (12/01 0726) Pulse Rate:  [52-90] 60 (12/01 0726) Resp:  [6-20] 18 (12/01 0726) BP: (100-128)/(52-64) 111/58 (12/01 0726) SpO2:  [93 %-99 %] 97 % (12/01 0726) Weight:  [76 kg-78.7 kg] 76.2 kg (12/01 0311)  Weight change: 1.769 kg Filed Weights   03/17/18 1200 03/17/18 1603 03/18/18 0311  Weight: 78.7 kg 76 kg 76.2 kg    Intake/Output:    Intake/Output Summary (Last 24 hours) at 03/18/2018 1038 Last data filed at 03/17/2018 2239 Gross per 24 hour  Intake 240 ml  Output 2000 ml  Net -1760 ml     Physical Exam: General:  No acute distress, sitting up in the chair  HEENT  anicteric, moist oral mucous membranes  Neck  supple,    Pulm/lungs  Clear  CVS/Heart  regular with ectopic beats  Abdomen:   Soft, nontender   Extremities:   2+ pitting edema bilaterally  Neurologic:  Alert, oriented  Skin:  No acute rashes  Access:  Right IJ PermCath, left upper extremity AV fistula + bruit        Basic Metabolic Panel:  Recent Labs  Lab 03/12/18 0416 03/13/18 0458 03/17/18 0434 03/18/18 0554  NA 137 137 136 136  K 4.6 4.5 4.6 4.9  CL 103 101 99 100  CO2 25 28 29 29   GLUCOSE 145* 127* 182* 167*  BUN 54* 37* 30* 39*  CREATININE 4.19* 3.42* 3.02* 3.87*  CALCIUM 8.4* 8.2* 8.0* 8.2*  MG 2.0  --   --   --      CBC: Recent Labs  Lab 03/12/18 0416 03/13/18 0458 03/17/18 0434  WBC 7.4 8.0 8.4  NEUTROABS  --   --  5.1  HGB 8.8* 8.2* 9.6*  HCT 28.1* 27.1* 31.1*  MCV 90.6 90.3 89.9  PLT 325 292 283      Lab Results  Component Value Date   HEPBSAG Negative 03/09/2018    HEPBSAB Non Reactive 03/09/2018      Microbiology:  No results found for this or any previous visit (from the past 240 hour(s)).  Coagulation Studies: No results for input(s): LABPROT, INR in the last 72 hours.  Urinalysis: No results for input(s): COLORURINE, LABSPEC, PHURINE, GLUCOSEU, HGBUR, BILIRUBINUR, KETONESUR, PROTEINUR, UROBILINOGEN, NITRITE, LEUKOCYTESUR in the last 72 hours.  Invalid input(s): APPERANCEUR    Imaging: Dg Chest 2 View  Result Date: 03/17/2018 CLINICAL DATA:  Shortness of breath. EXAM: CHEST - 2 VIEW COMPARISON:  Radiographs 03/10/2018, additional priors. Chest CT 07/10/2017 FINDINGS: Right dialysis catheter tip in the mid SVC. Post median sternotomy and CABG. Unchanged cardiomegaly. Pulmonary edema appears similar to prior exam. Small right pleural effusion is unchanged. Small left pleural effusion has increased. No pneumothorax or focal airspace disease. No acute osseous abnormalities. IMPRESSION: Cardiomegaly with pulmonary edema and small bilateral pleural effusions. The left pleural effusion has increased from radiograph 1 week ago, findings are otherwise unchanged. Electronically Signed   By: Keith Rake M.D.   On: 03/17/2018 05:36   Ct Angio Chest Pe W And/or Wo Contrast  Result Date: 03/17/2018 CLINICAL DATA:  Chest pain,  complex, intermediate/high prob of ACS/PE/AAS. EXAM: CT ANGIOGRAPHY CHEST WITH CONTRAST TECHNIQUE: Multidetector CT imaging of the chest was performed using the standard protocol during bolus administration of intravenous contrast. Multiplanar CT image reconstructions and MIPs were obtained to evaluate the vascular anatomy. CONTRAST:  77mL OMNIPAQUE IOHEXOL 350 MG/ML SOLN COMPARISON:  Radiographs earlier this day. Chest CT 07/10/2017. Lung bases from abdominal CT 8 days ago 03/09/2018 FINDINGS: Cardiovascular: There are no filling defects within the pulmonary arteries to suggest pulmonary embolus. Left basilar subsegmental pulmonary  arteries are partially obscured by cardiac motion artifact. Right internal jugular dialysis catheter tip at the atrial caval junction. Multi chamber cardiomegaly. Contrast refluxes into the hepatic veins and IVC. Post CABG with calcification of native coronary arteries. Atherosclerosis of the thoracic aorta without aneurysm. Cannot assess for dissection given phase of contrast. No pericardial effusion. Mediastinum/Nodes: Shotty mediastinal and bilateral hilar lymph nodes. Fluid level in the mid esophagus. No visualized thyroid nodule. Lungs/Pleura: Small to moderate bilateral pleural effusions, increased on the left over the past 8 days. Adjacent compressive atelectasis. Minimal septal thickening. Mild emphysema. No confluent airspace disease to suggest pneumonia. Upper Abdomen: Contrast refluxing into the hepatic veins and IVC. Musculoskeletal: Prior median sternotomy. There are no acute or suspicious osseous abnormalities. Multilevel degenerative change in the spine. Review of the MIP images confirms the above findings. IMPRESSION: 1. No pulmonary embolus. 2. Small to moderate bilateral pleural effusions, increased on the left over the past 8 days. Adjacent compressive atelectasis. 3. Multi chamber cardiomegaly. Contrast refluxing into the hepatic veins and IVC consistent with elevated right heart pressures. Mild pulmonary edema. Aortic Atherosclerosis (ICD10-I70.0) and Emphysema (ICD10-J43.9). Electronically Signed   By: Keith Rake M.D.   On: 03/17/2018 06:51     Medications:   . sodium chloride     . aspirin EC  81 mg Oral Daily  . atorvastatin  80 mg Oral Daily  . calcium acetate  667 mg Oral TID WC  . Chlorhexidine Gluconate Cloth  6 each Topical Q0600  . clopidogrel  75 mg Oral Daily  . heparin  5,000 Units Subcutaneous Q8H  . insulin aspart  0-15 Units Subcutaneous TID WC  . insulin aspart  0-5 Units Subcutaneous QHS  . isosorbide mononitrate  60 mg Oral Daily  . losartan  25 mg Oral  QHS  . metoprolol succinate  50 mg Oral QHS  . pantoprazole  40 mg Oral Daily  . polyethylene glycol  17 g Oral Daily  . sodium chloride flush  3 mL Intravenous Q12H  . tamsulosin  0.4 mg Oral QPC supper  . torsemide  40 mg Oral BID     Assessment/ Plan:  72 y.o. male with diabetes, hypertension, severe coronary disease, CABG 1996, peripheral arterial disease, congestive heart failure. ESRD started HD 03/10/2018.  2D echo from November 22 shows LVEF 20%, severely dilated left ventricle, global hypokinesis, moderate mitral regurgitation, moderate tricuspid regurgitation  [facility]/ MWF/ [facility]/ 76 kg  1. End stage renal disease.  Next routine HD tomorrow as outpatient  2. shortness of breath from pleural effusion and Acute pulmonary edema, lower extremity edema - clinical improvement after UF session; 2 L removed - d/c cozaar, reduce metoprolol Dose all antihypertensives at night  Fluid restriction to 32 ounces per day  3. Diabetes mellitus type II with chronic kidney disease: history of poor control.  Hemoglobin A1c 9.3% on 01/24/18.   4. Anemia of chronic kidney disease: hemoglobin 9.6 - EPO with HD treatments on MWF  LOS: [place] 12/1/201910:38 [place], [place]  Note: This note was prepared with Dragon dictation. Any transcription errors are unintentional

## 2018-03-19 ENCOUNTER — Telehealth: Payer: Self-pay | Admitting: Family

## 2018-03-19 ENCOUNTER — Ambulatory Visit: Payer: Self-pay | Admitting: Family

## 2018-03-19 ENCOUNTER — Telehealth: Payer: Self-pay

## 2018-03-19 DIAGNOSIS — N186 End stage renal disease: Secondary | ICD-10-CM | POA: Diagnosis not present

## 2018-03-19 DIAGNOSIS — Z992 Dependence on renal dialysis: Secondary | ICD-10-CM | POA: Diagnosis not present

## 2018-03-19 DIAGNOSIS — Z23 Encounter for immunization: Secondary | ICD-10-CM | POA: Diagnosis not present

## 2018-03-19 LAB — GLUCOSE, CAPILLARY
GLUCOSE-CAPILLARY: 155 mg/dL — AB (ref 70–99)
Glucose-Capillary: 126 mg/dL — ABNORMAL HIGH (ref 70–99)

## 2018-03-19 NOTE — Telephone Encounter (Signed)
Patient did not show for his Heart Failure Clinic appointment on 03/19/18. Will attempt to reschedule.

## 2018-03-19 NOTE — Telephone Encounter (Signed)
This encounter was created in error - please disregard.

## 2018-03-19 NOTE — Telephone Encounter (Signed)
Transition Care Management Follow-up Telephone Call  Date of discharge and from where: [facility] on 03/18/18.  How have you been since you were released from the hospital? Doing fine, finally got some sleep. Starting back at dialysis today and will continue 3 times a week. Declines fever, pain or n/v/d.  Any questions or concerns? No   Items Reviewed:  Did the pt receive and understand the discharge instructions provided? Yes   Medications obtained and verified? No, wife declined today.  Any new allergies since your discharge? No   Dietary orders reviewed? Yes  Do you have support at home? Yes   Other (ie: DME, Home Health, etc) N/A  Functional Questionnaire: (I = Independent and D = Dependent)  Bathing/Dressing- I   Meal Prep- I  Eating- I  Maintaining continence- I  Transferring/Ambulation- I  Managing Meds- Has assistance from daughter.   Follow up appointments reviewed:    PCP Hospital f/u appt confirmed? Yes  Scheduled to see Dr. Caryn Section on 03/27/18 @ 10 AM.  [facility] f/u appt confirmed? Yes    Are transportation arrangements needed? No   If their condition worsens, is the pt aware to call  their PCP or go to the ED? Yes  Was the patient provided with contact information for the PCP's office or ED? Yes  Was the pt encouraged to call back with questions or concerns? Yes

## 2018-03-21 ENCOUNTER — Ambulatory Visit: Payer: Self-pay

## 2018-03-21 DIAGNOSIS — E1122 Type 2 diabetes mellitus with diabetic chronic kidney disease: Secondary | ICD-10-CM

## 2018-03-21 DIAGNOSIS — Z992 Dependence on renal dialysis: Secondary | ICD-10-CM | POA: Diagnosis not present

## 2018-03-21 DIAGNOSIS — N183 Chronic kidney disease, stage 3 (moderate): Secondary | ICD-10-CM

## 2018-03-21 DIAGNOSIS — N186 End stage renal disease: Secondary | ICD-10-CM | POA: Diagnosis not present

## 2018-03-21 DIAGNOSIS — N184 Chronic kidney disease, stage 4 (severe): Secondary | ICD-10-CM

## 2018-03-21 DIAGNOSIS — Z23 Encounter for immunization: Secondary | ICD-10-CM | POA: Diagnosis not present

## 2018-03-21 DIAGNOSIS — I5023 Acute on chronic systolic (congestive) heart failure: Secondary | ICD-10-CM

## 2018-03-21 NOTE — Chronic Care Management (AMB) (Signed)
  Chronic Care Management   Telephone Outreach Note  03/21/2018 Name: Thomas [name] MRN: [id number] DOB: 06-29-1945  Referred by: [facility] Nurse Liaison Nino Parsley Reason for referral : Chronic Care Management (introduction of services)    I reached out to Mr. [name] today by phone in response to a referral sent by [facility] Liaison post hospital discharge 03/18/18 for acute on chronic heart failure exacerbation requiring dialysis. Mr. Kunath has had 4 ED/Hospital visit during the month of November. Patient was unavailable per wife (he was napping). Basic information about CCM services were provided to patients wife who is listed on DPR. Wife stated patient would not be interested because he has had classes in the past about his condition but we could follow up with patient during his upcoming visit with Dr Caryn Section on 03/27/18 at 10:00.  Attempted to discuss recent missed appointment to [facility] HF clinic on Monday 03/19/18. Wife became agitated and stated "I told you we have had all the heart failure education we need". She was not interested in assistance with rescheduling HF clinic appointment and would not listen to explanation for appointment.     Plan: Will follow up with patient on 03/27/18 for introduction of services to Chronic Case Management  Fisher, Kirstie Peri, MD has been notified of this outreach and Mr. Collie Wernick [facility] decision and plan.   Toniette Devera E. Rollene Rotunda, RN, BSN Nurse Care Coordinator [facility] Care Management [phone]

## 2018-03-23 ENCOUNTER — Telehealth: Payer: Self-pay | Admitting: Family Medicine

## 2018-03-23 DIAGNOSIS — N186 End stage renal disease: Secondary | ICD-10-CM | POA: Diagnosis not present

## 2018-03-23 DIAGNOSIS — Z23 Encounter for immunization: Secondary | ICD-10-CM | POA: Diagnosis not present

## 2018-03-23 DIAGNOSIS — Z992 Dependence on renal dialysis: Secondary | ICD-10-CM | POA: Diagnosis not present

## 2018-03-23 NOTE — Telephone Encounter (Signed)
Pt's wife cancelled pt's Dec 10th appt because he has an appt on the 16th.  She states he

## 2018-03-24 ENCOUNTER — Other Ambulatory Visit: Payer: Self-pay | Admitting: Physician Assistant

## 2018-03-24 DIAGNOSIS — G479 Sleep disorder, unspecified: Secondary | ICD-10-CM

## 2018-03-26 ENCOUNTER — Other Ambulatory Visit: Payer: Self-pay | Admitting: Family Medicine

## 2018-03-26 ENCOUNTER — Inpatient Hospital Stay: Payer: Self-pay | Admitting: Family Medicine

## 2018-03-26 DIAGNOSIS — Z992 Dependence on renal dialysis: Secondary | ICD-10-CM | POA: Diagnosis not present

## 2018-03-26 DIAGNOSIS — Z23 Encounter for immunization: Secondary | ICD-10-CM | POA: Diagnosis not present

## 2018-03-26 DIAGNOSIS — N186 End stage renal disease: Secondary | ICD-10-CM | POA: Diagnosis not present

## 2018-03-27 ENCOUNTER — Ambulatory Visit (INDEPENDENT_AMBULATORY_CARE_PROVIDER_SITE_OTHER): Payer: Medicare Other | Admitting: Family Medicine

## 2018-03-27 ENCOUNTER — Inpatient Hospital Stay: Payer: Self-pay | Admitting: Family Medicine

## 2018-03-27 ENCOUNTER — Encounter: Payer: Self-pay | Admitting: Family Medicine

## 2018-03-27 VITALS — BP 126/64 | HR 72 | Temp 98.3°F | Resp 16 | Wt 174.0 lb

## 2018-03-27 DIAGNOSIS — N185 Chronic kidney disease, stage 5: Secondary | ICD-10-CM | POA: Diagnosis not present

## 2018-03-27 DIAGNOSIS — I5023 Acute on chronic systolic (congestive) heart failure: Secondary | ICD-10-CM

## 2018-03-27 DIAGNOSIS — F410 Panic disorder [episodic paroxysmal anxiety] without agoraphobia: Secondary | ICD-10-CM | POA: Insufficient documentation

## 2018-03-27 DIAGNOSIS — Z992 Dependence on renal dialysis: Secondary | ICD-10-CM

## 2018-03-27 DIAGNOSIS — G4709 Other insomnia: Secondary | ICD-10-CM

## 2018-03-27 MED ORDER — ALPRAZOLAM 0.25 MG PO TABS: 0.2500 mg | ORAL_TABLET | Freq: Every day | ORAL | 3 refills | Status: DC | PRN

## 2018-03-27 MED ORDER — LORAZEPAM 0.5 MG PO TABS: 0.5000 mg | ORAL_TABLET | Freq: Every evening | ORAL | 3 refills | Status: AC | PRN

## 2018-03-27 NOTE — Progress Notes (Signed)
Patient: Thomas [name] Male    DOB: 1945-08-27   72 y.o.   MRN: [id number] Visit Date: 03/27/2018  Today's Provider: Lelon Huh, MD   Chief Complaint  Patient presents with  . Hospitalization Follow-up  . Congestive Heart Failure  . Insomnia  . Anxiety   Subjective:     Congestive Heart Failure  Pertinent negatives include no chest pain or shortness of breath.  Insomnia  Primary symptoms: sleep disturbance.  The current episode started 1 to 4 weeks ago (Started about three weeks ago.). The problem occurs nightly. The problem has been gradually worsening since onset. How many beverages per day that contain caffeine: 0 - 1.  Past treatments include medication (Benadryl, Melatonin, Trazadone.). The treatment provided no relief. Typical bedtime:  8-10 P.M..  How long after going to bed to you fall asleep: over an hour (Pt reports it can take several hours to fall asleep.).   PMH includes: restless leg syndrome.  Anxiety  Presents for initial visit. The problem has been gradually worsening. Symptoms include decreased concentration, insomnia, muscle tension, nervous/anxious behavior, panic and restlessness. Patient reports no chest pain, compulsions, confusion, depressed mood, excessive worry, feeling of choking, irritability, shortness of breath or suicidal ideas.    States that he feels very anxious at night, but not having any trouble breathing. Is only sleeping 30-60 minutes. Also having anxiety like attacks with dialysis.    Follow up Hospitalization  Patient was admitted to [facility] on 03/17/2018 and discharged on 03/18/2018. He was treated for CHF, Pleural Effusion, Chest Pain. Treatment for this included starting dialysis which he has continued 3 x week as an outpatient.  Telephone follow up was done on 03/19/2018 He reports excellent compliance with treatment. He reports this condition is  Improved.  ------------------------------------------------------------------------------------        Allergies  Allergen Reactions  . Ciprofloxacin Itching and Swelling    Facial swelling   . Trulicity [Dulaglutide]     Bloating  . Hydralazine Nausea And Vomiting     Current Outpatient Medications:  .  acetaminophen (TYLENOL) 500 MG tablet, Take 500 mg by mouth every 8 (eight) hours as needed for mild pain or headache., Disp: , Rfl:  .  atorvastatin (LIPITOR) 80 MG tablet, Take 1 tablet (80 mg total) by mouth daily at 6 PM., Disp: 30 tablet, Rfl: 5 .  Blood Glucose Monitoring Suppl (ONE TOUCH ULTRA 2) w/Device KIT, Use to check gluocose daily for type 2 diabetes, Disp: 1 each, Rfl: 0 .  calcium acetate (PHOSLO) 667 MG capsule, Take 1 capsule (667 mg total) by mouth 3 (three) times daily with meals., Disp: 90 capsule, Rfl: 0 .  clopidogrel (PLAVIX) 75 MG tablet, TAKE 1 TABLET BY MOUTH ONCE DAILY, Disp: 30 tablet, Rfl: 2 .  glucose blood test strip, Contour [employer] test strips and lancets Use to check blood sugar daily for type 2 diabetes, E11.9., Disp: 100 each, Rfl: 4 .  insulin degludec (TRESIBA FLEXTOUCH) 100 UNIT/ML SOPN FlexTouch Pen, Inject 0.06 mLs (6 Units total) into the skin daily. Increase by 2 units a day if fasting glucose is over 200, Disp: 3 mL, Rfl: 0 .  isosorbide mononitrate (IMDUR) 60 MG 24 hr tablet, TAKE 1 TABLET BY MOUTH ONCE DAILY., Disp: 30 tablet, Rfl: 5 .  losartan (COZAAR) 25 MG tablet, Take 25 mg by mouth every evening., Disp: , Rfl:  .  metoprolol succinate (TOPROL-XL) 50 MG 24 hr tablet, Take 50 mg by  mouth daily. Take with or immediately following a meal., Disp: , Rfl:  .  pantoprazole (PROTONIX) 40 MG tablet, Take 1 tablet (40 mg total) by mouth daily. (Patient taking differently: Take 40 mg by mouth 2 (two) times daily. ), Disp: 30 tablet, Rfl: 12 .  sodium bicarbonate 650 MG tablet, Take 650 mg by mouth 2 (two) times daily., Disp: , Rfl:  .   tamsulosin (FLOMAX) 0.4 MG CAPS capsule, TAKE 1 CAPSULE BY MOUTH ONCE DAILY AFTERSUPPER, Disp: 30 capsule, Rfl: 5 .  torsemide (DEMADEX) 20 MG tablet, Take 2 tablets (40 mg total) by mouth 2 (two) times daily., Disp: 120 tablet, Rfl: 0 .  traMADol (ULTRAM) 50 MG tablet, Take 1 tablet (50 mg total) by mouth every 6 (six) hours as needed., Disp: 20 tablet, Rfl: 0 .  traZODone (DESYREL) 50 MG tablet, TAKE 1/2 TO 1 TABLET BY MOUTH AT BEDTIMEAS NEEDED SLEEP, Disp: 10 tablet, Rfl: 5 .  aspirin EC 81 MG tablet, Take 81 mg by mouth daily., Disp: , Rfl:   Review of Systems  Constitutional: Negative for irritability.  Respiratory: Negative for shortness of breath.   Cardiovascular: Negative for chest pain.  Musculoskeletal: Positive for arthralgias and myalgias. Negative for back pain, gait problem, joint swelling, neck pain and neck stiffness.       Pt reports his legs hurt especially at night.    Psychiatric/Behavioral: Positive for decreased concentration and sleep disturbance. Negative for confusion and suicidal ideas. The patient is nervous/anxious and has insomnia.     Social History   Tobacco Use  . Smoking status: Former Smoker    Packs/day: 1.00    Years: 51.00    Pack years: 51.00    Types: Cigarettes    Last attempt to quit: 06/21/2017    Years since quitting: 0.7  . Smokeless tobacco: Never Used  . Tobacco comment: started smoking at age [age]. Smoked for 50 years  Substance Use Topics  . Alcohol use: Yes    Alcohol/week: 0.0 standard drinks    Frequency: Never    Comment: 1 every 6 months   Objective:   BP 126/64 (BP Location: Right Arm, Patient Position: Sitting, Cuff Size: Large)   Pulse 72   Temp 98.3 F (36.8 C) (Oral)   Resp 16   Wt 174 lb (78.9 kg)   BMI 28.08 kg/m  Vitals:   03/27/18 1016  BP: 126/64  Pulse: 72  Resp: 16  Temp: 98.3 F (36.8 C)  TempSrc: Oral  Weight: 174 lb (78.9 kg)     Physical Exam   General Appearance:    Alert, cooperative, no  distress  Eyes:    PERRL, conjunctiva/corneas clear, EOM's intact       Lungs:     Clear to auscultation bilaterally, respirations unlabored  Heart:    Regular rate and rhythm, trace pedal edema.   Neurologic:   Awake, alert, oriented x 3. No apparent focal neurological           defect.          Assessment & Plan:     1. Acute on chronic systolic CHF (congestive heart failure), NYHA class 2 ([place]) Doing better since starting dialysis, continue torsemide days off dialysis.   2. Other insomnia No improvement with trazodone and seems to be brought on by anxiety. Try qhs lorazepam.   3. Dependence on hemodialysis (HCC)   4. CKD (chronic kidney disease) stage 5, GFR less than 15 ml/min (HCC)  5.  Panic attacks Since starting dialysis. Will prescription low dose alprazolam to take as needed during on days he has dialyzis.   .Follow up in 1 month        Lelon Huh, MD  [facility]

## 2018-03-28 DIAGNOSIS — Z992 Dependence on renal dialysis: Secondary | ICD-10-CM | POA: Diagnosis not present

## 2018-03-28 DIAGNOSIS — N186 End stage renal disease: Secondary | ICD-10-CM | POA: Diagnosis not present

## 2018-03-28 DIAGNOSIS — Z23 Encounter for immunization: Secondary | ICD-10-CM | POA: Diagnosis not present

## 2018-03-29 ENCOUNTER — Inpatient Hospital Stay: Payer: Self-pay | Admitting: General Surgery

## 2018-03-30 DIAGNOSIS — N186 End stage renal disease: Secondary | ICD-10-CM | POA: Diagnosis not present

## 2018-03-30 DIAGNOSIS — Z992 Dependence on renal dialysis: Secondary | ICD-10-CM | POA: Diagnosis not present

## 2018-03-30 DIAGNOSIS — Z23 Encounter for immunization: Secondary | ICD-10-CM | POA: Diagnosis not present

## 2018-04-02 ENCOUNTER — Ambulatory Visit: Payer: Self-pay | Admitting: Family Medicine

## 2018-04-02 DIAGNOSIS — Z992 Dependence on renal dialysis: Secondary | ICD-10-CM | POA: Diagnosis not present

## 2018-04-02 DIAGNOSIS — N186 End stage renal disease: Secondary | ICD-10-CM | POA: Diagnosis not present

## 2018-04-02 DIAGNOSIS — Z23 Encounter for immunization: Secondary | ICD-10-CM | POA: Diagnosis not present

## 2018-04-04 ENCOUNTER — Encounter (INDEPENDENT_AMBULATORY_CARE_PROVIDER_SITE_OTHER): Payer: Self-pay

## 2018-04-04 DIAGNOSIS — Z992 Dependence on renal dialysis: Secondary | ICD-10-CM | POA: Diagnosis not present

## 2018-04-04 DIAGNOSIS — Z23 Encounter for immunization: Secondary | ICD-10-CM | POA: Diagnosis not present

## 2018-04-04 DIAGNOSIS — N186 End stage renal disease: Secondary | ICD-10-CM | POA: Diagnosis not present

## 2018-04-06 DIAGNOSIS — N186 End stage renal disease: Secondary | ICD-10-CM | POA: Diagnosis not present

## 2018-04-06 DIAGNOSIS — Z23 Encounter for immunization: Secondary | ICD-10-CM | POA: Diagnosis not present

## 2018-04-06 DIAGNOSIS — Z992 Dependence on renal dialysis: Secondary | ICD-10-CM | POA: Diagnosis not present

## 2018-04-09 DIAGNOSIS — N186 End stage renal disease: Secondary | ICD-10-CM | POA: Diagnosis not present

## 2018-04-09 DIAGNOSIS — Z1159 Encounter for screening for other viral diseases: Secondary | ICD-10-CM | POA: Diagnosis not present

## 2018-04-09 DIAGNOSIS — Z992 Dependence on renal dialysis: Secondary | ICD-10-CM | POA: Diagnosis not present

## 2018-04-09 DIAGNOSIS — Z23 Encounter for immunization: Secondary | ICD-10-CM | POA: Diagnosis not present

## 2018-04-10 ENCOUNTER — Other Ambulatory Visit: Payer: Self-pay | Admitting: Family Medicine

## 2018-04-10 ENCOUNTER — Other Ambulatory Visit: Payer: Self-pay | Admitting: Internal Medicine

## 2018-04-10 DIAGNOSIS — I5023 Acute on chronic systolic (congestive) heart failure: Secondary | ICD-10-CM

## 2018-04-10 MED ORDER — LOSARTAN POTASSIUM 25 MG PO TABS: 25.0000 mg | ORAL_TABLET | Freq: Every evening | ORAL | 6 refills | Status: DC

## 2018-04-10 NOTE — Telephone Encounter (Signed)
Please advise 

## 2018-04-10 NOTE — Telephone Encounter (Signed)
[facility] faxed refill request for the following medications:  Losartan Potassium 25 mg  Please advise. Thanks TNP

## 2018-04-12 ENCOUNTER — Ambulatory Visit
Admission: RE | Admit: 2018-04-12 | Discharge: 2018-04-12 | Disposition: A | Discharge status: Home / Self Care | Payer: Medicare Other | Attending: Vascular Surgery | Admitting: Vascular Surgery

## 2018-04-12 ENCOUNTER — Encounter
Admission: RE | Disposition: A | Discharge status: Home / Self Care | Payer: Self-pay | Source: Home / Self Care | Attending: Vascular Surgery

## 2018-04-12 ENCOUNTER — Telehealth (INDEPENDENT_AMBULATORY_CARE_PROVIDER_SITE_OTHER): Payer: Self-pay

## 2018-04-12 ENCOUNTER — Ambulatory Visit (INDEPENDENT_AMBULATORY_CARE_PROVIDER_SITE_OTHER): Payer: Self-pay | Admitting: Nurse Practitioner

## 2018-04-12 ENCOUNTER — Other Ambulatory Visit (INDEPENDENT_AMBULATORY_CARE_PROVIDER_SITE_OTHER): Payer: Self-pay | Admitting: Nurse Practitioner

## 2018-04-12 ENCOUNTER — Encounter (INDEPENDENT_AMBULATORY_CARE_PROVIDER_SITE_OTHER): Payer: Self-pay

## 2018-04-12 DIAGNOSIS — Z881 Allergy status to other antibiotic agents status: Secondary | ICD-10-CM | POA: Diagnosis not present

## 2018-04-12 DIAGNOSIS — Z992 Dependence on renal dialysis: Secondary | ICD-10-CM

## 2018-04-12 DIAGNOSIS — E1122 Type 2 diabetes mellitus with diabetic chronic kidney disease: Secondary | ICD-10-CM | POA: Insufficient documentation

## 2018-04-12 DIAGNOSIS — Z87891 Personal history of nicotine dependence: Secondary | ICD-10-CM | POA: Diagnosis not present

## 2018-04-12 DIAGNOSIS — Z955 Presence of coronary angioplasty implant and graft: Secondary | ICD-10-CM | POA: Insufficient documentation

## 2018-04-12 DIAGNOSIS — Z23 Encounter for immunization: Secondary | ICD-10-CM | POA: Diagnosis not present

## 2018-04-12 DIAGNOSIS — E1151 Type 2 diabetes mellitus with diabetic peripheral angiopathy without gangrene: Secondary | ICD-10-CM | POA: Diagnosis not present

## 2018-04-12 DIAGNOSIS — K219 Gastro-esophageal reflux disease without esophagitis: Secondary | ICD-10-CM | POA: Insufficient documentation

## 2018-04-12 DIAGNOSIS — Z951 Presence of aortocoronary bypass graft: Secondary | ICD-10-CM | POA: Diagnosis not present

## 2018-04-12 DIAGNOSIS — I120 Hypertensive chronic kidney disease with stage 5 chronic kidney disease or end stage renal disease: Secondary | ICD-10-CM | POA: Insufficient documentation

## 2018-04-12 DIAGNOSIS — E785 Hyperlipidemia, unspecified: Secondary | ICD-10-CM | POA: Insufficient documentation

## 2018-04-12 DIAGNOSIS — Y831 Surgical operation with implant of artificial internal device as the cause of abnormal reaction of the patient, or of later complication, without mention of misadventure at the time of the procedure: Secondary | ICD-10-CM | POA: Diagnosis not present

## 2018-04-12 DIAGNOSIS — Z888 Allergy status to other drugs, medicaments and biological substances status: Secondary | ICD-10-CM | POA: Diagnosis not present

## 2018-04-12 DIAGNOSIS — N186 End stage renal disease: Secondary | ICD-10-CM | POA: Insufficient documentation

## 2018-04-12 DIAGNOSIS — T8249XA Other complication of vascular dialysis catheter, initial encounter: Secondary | ICD-10-CM | POA: Insufficient documentation

## 2018-04-12 DIAGNOSIS — M1990 Unspecified osteoarthritis, unspecified site: Secondary | ICD-10-CM | POA: Insufficient documentation

## 2018-04-12 DIAGNOSIS — I252 Old myocardial infarction: Secondary | ICD-10-CM | POA: Diagnosis not present

## 2018-04-12 DIAGNOSIS — I2510 Atherosclerotic heart disease of native coronary artery without angina pectoris: Secondary | ICD-10-CM

## 2018-04-12 DIAGNOSIS — F17211 Nicotine dependence, cigarettes, in remission: Secondary | ICD-10-CM

## 2018-04-12 DIAGNOSIS — T82898A Other specified complication of vascular prosthetic devices, implants and grafts, initial encounter: Secondary | ICD-10-CM

## 2018-04-12 HISTORY — PX: DIALYSIS/PERMA CATHETER REMOVAL: CATH118289

## 2018-04-12 SURGERY — DIALYSIS/PERMA CATHETER REMOVAL
Anesthesia: LOCAL

## 2018-04-12 MED ORDER — LIDOCAINE-EPINEPHRINE (PF) 1 %-1:200000 IJ SOLN
INTRAMUSCULAR | Status: DC | PRN
  Administered 2018-04-12: 10 mL via INTRADERMAL

## 2018-04-12 MED ORDER — TRAMADOL HCL 50 MG PO TABS: 50.0000 mg | ORAL_TABLET | Freq: Every evening | ORAL | 0 refills | Status: DC | PRN

## 2018-04-12 SURGICAL SUPPLY — 2 items
FORCEPS HALSTEAD CVD 5IN STRL (INSTRUMENTS) ×2 IMPLANT
TRAY LACERAT/PLASTIC (MISCELLANEOUS) ×2 IMPLANT

## 2018-04-12 NOTE — Telephone Encounter (Signed)
-----   Message from Algernon Huxley, MD sent at 04/12/2018 12:11 PM EST ----- Can we call in a Rx for Tramadol, 50 mg tabs.  Take one QHS PRN.  Dispense 30 with no refills.   thanks

## 2018-04-12 NOTE — H&P (Signed)
[facility] Admission History & Physical  MRN : [id number]  Thomas [name] is a 72 y.o. ([birth year]) male who presents with chief complaint of No chief complaint on file. Marland Kitchen  History of Present Illness: I am asked to evaluate the patient by the dialysis center. The patient was sent here because they have a nonfunctioning tunneled catheter and a functioning AVF.  The patient reports they're not been any problems with any of their dialysis runs. They are reporting good flows with good parameters at dialysis.  Patient denies pain or tenderness overlying the access.  There is no pain with dialysis.  The patient denies hand pain or finger pain consistent with steal syndrome.  No fevers or chills while on dialysis.   No current facility-administered medications for this encounter.     Past Medical History:  Diagnosis Date  . Allergy   . Arthritis   . CAD (coronary artery disease)   . Chronic kidney disease   . Diabetes mellitus without complication ([place])   . Dyspnea   . Erosive esophagitis   . GERD (gastroesophageal reflux disease)   . Gouty arthropathy   . History of colonic polyps   . History of kidney stones   . History of MRSA infection   . Hyperkalemia   . Hyperlipidemia   . Hypertension   . Melanoma ([place])   . Myocardial infarction ([place]) 1986  . Peripheral vascular disease ([place])   . 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 Trigger finger of left hand   . Ureteral tumor     Past Surgical History:  Procedure Laterality Date  . A/V SHUNT INTERVENTION Left 03/12/2018   Procedure: A/V SHUNT INTERVENTION;  Surgeon: Algernon Huxley, MD;  Location: [facility];  Service: Cardiovascular;  Laterality: Left;  . AV FISTULA PLACEMENT Left 12/07/2017   Procedure: ARTERIOVENOUS (AV) FISTULA CREATION ( BRACHIOBASILIC STAGE );  Surgeon: Algernon Huxley, MD;  Location: [facility];  Service: Vascular;  Laterality: Left;  . CATARACT EXTRACTION     . CORONARY ARTERY BYPASS GRAFT  1996  . CORONARY STENT INTERVENTION N/A 05/29/2017   Procedure: CORONARY STENT INTERVENTION;  Surgeon: Isaias Cowman, MD;  Location: [facility];  Service: Cardiovascular;  Laterality: N/A;  . EXCISION Ureteral Tumor     (benign) Dr. Quillian Quince  . EYE SURGERY Bilateral    Cataract Extraction with IOL  . LEFT HEART CATH AND CORONARY ANGIOGRAPHY N/A 10/18/2016   Procedure: Left Heart Cath and Coronary Angiography;  Surgeon: Isaias Cowman, MD;  Location: [facility];  Service: Cardiovascular;  Laterality: N/A;  . LEFT HEART CATH AND CORONARY ANGIOGRAPHY N/A 05/29/2017   Procedure: LEFT HEART CATH AND CORONARY ANGIOGRAPHY;  Surgeon: Isaias Cowman, MD;  Location: [facility];  Service: Cardiovascular;  Laterality: N/A;  . LOWER EXTREMITY ANGIOGRAPHY Right 10/31/2016   Procedure: Lower Extremity Angiography;  Surgeon: Algernon Huxley, MD;  Location: [facility];  Service: Cardiovascular;  Laterality: Right;  . LOWER EXTREMITY ANGIOGRAPHY Left 11/14/2016   Procedure: Lower Extremity Angiography;  Surgeon: Algernon Huxley, MD;  Location: [facility];  Service: Cardiovascular;  Laterality: Left;  . LOWER EXTREMITY INTERVENTION  11/14/2016   Procedure: Lower Extremity Intervention;  Surgeon: Algernon Huxley, MD;  Location: [facility];  Service: Cardiovascular;;  . RETINAL DETACHMENT SURGERY Right November 2107    Social History Social History   Tobacco Use  .  Smoking status: Former Smoker    Packs/day: 1.00    Years: 51.00    Pack years: 51.00    Types: Cigarettes    Last attempt to quit: 06/21/2017    Years since quitting: 0.8  . Smokeless tobacco: Never Used  . Tobacco comment: started smoking at age [age]. Smoked for 50 years  Substance Use Topics  . Alcohol use: Yes    Alcohol/week: 0.0 standard drinks    Frequency: Never    Comment: 1 every 6 months  . Drug use: No    Family History Family  History  Problem Relation Age of Onset  . Alzheimer's disease Mother   . Alzheimer's disease Brother   . Lung cancer Paternal Grandfather     No family history of bleeding or clotting disorders, autoimmune disease or porphyria  Allergies  Allergen Reactions  . Ciprofloxacin Itching and Swelling    Facial swelling   . Trulicity [Dulaglutide]     Bloating  . Hydralazine Nausea And Vomiting     REVIEW OF SYSTEMS (Negative unless checked)  Constitutional: [] Weight loss  [] Fever  [] Chills Cardiac: [] Chest pain   [] Chest pressure   [] Palpitations   [] Shortness of breath when laying flat   [] Shortness of breath at rest   [x] Shortness of breath with exertion. Vascular:  [] Pain in legs with walking   [] Pain in legs at rest   [] Pain in legs when laying flat   [] Claudication   [] Pain in feet when walking  [] Pain in feet at rest  [] Pain in feet when laying flat   [] History of DVT   [] Phlebitis   [] Swelling in legs   [] Varicose veins   [] Non-healing ulcers Pulmonary:   [] Uses home oxygen   [] Productive cough   [] Hemoptysis   [] Wheeze  [] COPD   [] Asthma Neurologic:  [] Dizziness  [] Blackouts   [] Seizures   [] History of stroke   [] History of TIA  [] Aphasia   [] Temporary blindness   [] Dysphagia   [] Weakness or numbness in arms   [] Weakness or numbness in legs Musculoskeletal:  [] Arthritis   [] Joint swelling   [] Joint pain   [] Low back pain Hematologic:  [] Easy bruising  [] Easy bleeding   [] Hypercoagulable state   [] Anemic  [] Hepatitis Gastrointestinal:  [] Blood in stool   [] Vomiting blood  [] Gastroesophageal reflux/heartburn   [] Difficulty swallowing. Genitourinary:  [x] Chronic kidney disease   [] Difficult urination  [] Frequent urination  [] Burning with urination   [] Blood in urine Skin:  [] Rashes   [] Ulcers   [] Wounds Psychological:  [] History of anxiety   []  History of major depression.  Physical Examination  There were no vitals filed for this visit. There is no height or weight on file to  calculate BMI. Gen: WD/WN, NAD Head: [place]/AT, No temporalis wasting.  Ear/Nose/Throat: Hearing grossly intact, nares w/o erythema or drainage, oropharynx w/o Erythema/Exudate,  Eyes: Conjunctiva clear, sclera non-icteric Neck: Trachea midline.  No JVD.  Pulmonary:  Good air movement, respirations not labored, no use of accessory muscles.  Cardiac: RRR, normal S1, S2. Vascular: good thrill in AVF Vessel Right Left  Radial Palpable Palpable   Musculoskeletal: M/S 5/5 throughout.  Extremities without ischemic changes.  No deformity or atrophy.  Neurologic: Sensation grossly intact in extremities.  Symmetrical.  Speech is fluent. Motor exam as listed above. Psychiatric: Judgment intact, Mood & affect appropriate for pt's clinical situation. Dermatologic: No rashes or ulcers noted.  No cellulitis or open wounds.    CBC Lab Results  Component Value Date   WBC  8.4 03/17/2018   HGB 9.6 (L) 03/17/2018   HCT 31.1 (L) 03/17/2018   MCV 89.9 03/17/2018   PLT 283 03/17/2018    BMET    Component Value Date/Time   NA 136 03/18/2018 0554   NA 136 02/19/2018 1040   NA 142 04/03/2014 1535   K 4.9 03/18/2018 0554   K 4.3 04/03/2014 1535   CL 100 03/18/2018 0554   CL 110 (H) 04/03/2014 1535   CO2 29 03/18/2018 0554   CO2 27 04/03/2014 1535   GLUCOSE 167 (H) 03/18/2018 0554   GLUCOSE 89 04/03/2014 1535   BUN 39 (H) 03/18/2018 0554   BUN 45 (H) 02/19/2018 1040   BUN 14 04/03/2014 1535   CREATININE 3.87 (H) 03/18/2018 0554   CREATININE 1.13 04/03/2014 1535   CALCIUM 8.2 (L) 03/18/2018 0554   CALCIUM 8.2 (L) 04/03/2014 1535   GFRNONAA 14 (L) 03/18/2018 0554   GFRNONAA >60 04/03/2014 1535   GFRAA 17 (L) 03/18/2018 0554   GFRAA >60 04/03/2014 1535   CrCl cannot be calculated (Patient's most recent lab result is older than the maximum 21 days allowed.).  COAG Lab Results  Component Value Date   INR 1.09 02/24/2018   INR 1.02 11/30/2017   INR 1.02 05/29/2017    Radiology Dg Chest 2  View  Result Date: 03/17/2018 CLINICAL DATA:  Shortness of breath. EXAM: CHEST - 2 VIEW COMPARISON:  Radiographs 03/10/2018, additional priors. Chest CT 07/10/2017 FINDINGS: Right dialysis catheter tip in the mid SVC. Post median sternotomy and CABG. Unchanged cardiomegaly. Pulmonary edema appears similar to prior exam. Small right pleural effusion is unchanged. Small left pleural effusion has increased. No pneumothorax or focal airspace disease. No acute osseous abnormalities. IMPRESSION: Cardiomegaly with pulmonary edema and small bilateral pleural effusions. The left pleural effusion has increased from radiograph 1 week ago, findings are otherwise unchanged. Electronically Signed   By: Keith Rake M.D.   On: 03/17/2018 05:36   Ct Angio Chest Pe W And/or Wo Contrast  Result Date: 03/17/2018 CLINICAL DATA:  Chest pain, complex, intermediate/high prob of ACS/PE/AAS. EXAM: CT ANGIOGRAPHY CHEST WITH CONTRAST TECHNIQUE: Multidetector CT imaging of the chest was performed using the standard protocol during bolus administration of intravenous contrast. Multiplanar CT image reconstructions and MIPs were obtained to evaluate the vascular anatomy. CONTRAST:  55mL OMNIPAQUE IOHEXOL 350 MG/ML SOLN COMPARISON:  Radiographs earlier this day. Chest CT 07/10/2017. Lung bases from abdominal CT 8 days ago 03/09/2018 FINDINGS: Cardiovascular: There are no filling defects within the pulmonary arteries to suggest pulmonary embolus. Left basilar subsegmental pulmonary arteries are partially obscured by cardiac motion artifact. Right internal jugular dialysis catheter tip at the atrial caval junction. Multi chamber cardiomegaly. Contrast refluxes into the hepatic veins and IVC. Post CABG with calcification of native coronary arteries. Atherosclerosis of the thoracic aorta without aneurysm. Cannot assess for dissection given phase of contrast. No pericardial effusion. Mediastinum/Nodes: Shotty mediastinal and bilateral hilar  lymph nodes. Fluid level in the mid esophagus. No visualized thyroid nodule. Lungs/Pleura: Small to moderate bilateral pleural effusions, increased on the left over the past 8 days. Adjacent compressive atelectasis. Minimal septal thickening. Mild emphysema. No confluent airspace disease to suggest pneumonia. Upper Abdomen: Contrast refluxing into the hepatic veins and IVC. Musculoskeletal: Prior median sternotomy. There are no acute or suspicious osseous abnormalities. Multilevel degenerative change in the spine. Review of the MIP images confirms the above findings. IMPRESSION: 1. No pulmonary embolus. 2. Small to moderate bilateral pleural effusions, increased on the left  over the past 8 days. Adjacent compressive atelectasis. 3. Multi chamber cardiomegaly. Contrast refluxing into the hepatic veins and IVC consistent with elevated right heart pressures. Mild pulmonary edema. Aortic Atherosclerosis (ICD10-I70.0) and Emphysema (ICD10-J43.9). Electronically Signed   By: Keith Rake M.D.   On: 03/17/2018 06:51    Assessment/Plan 1.  Complication dialysis device with non-functional catheter:  Patient's Tunneled catheter is not being used. The patient has an extremity access that is functioning well. Therefore, the patient will undergo removal of the tunneled catheter under local anesthesia.  The risks and benefits were described to the patient.  All questions were answered.  The patient agrees to proceed with angiography and intervention. Potassium will be drawn to ensure that it is an appropriate level prior to performing intervention. 2.  End-stage renal disease requiring hemodialysis:  Patient will continue dialysis therapy without further interruption  3.  Hypertension:  Patient will continue medical management; nephrology is following no changes in oral medications. 4.  Diabetes mellitus:  Glucose will be monitored and oral medications been held this morning once the patient has undergone the patient's  procedure po intake will be reinitiated and again Accu-Cheks will be used to assess the blood glucose level and treat as needed. The patient will be restarted on the patient's usual hypoglycemic regime 5.  Coronary artery disease:  EKG will be monitored. Nitrates will be used if needed. The patient's oral cardiac medications will be continued.    Leotis Pain, MD  04/12/2018 10:05 AM

## 2018-04-12 NOTE — Telephone Encounter (Signed)
Patient was advised.  

## 2018-04-12 NOTE — Op Note (Signed)
Operative Note     Preoperative diagnosis:   1. ESRD with functional permanent access  Postoperative diagnosis:  1. ESRD with functional permanent access  Procedure:  Removal of right jugular Permcath  Surgeon:  Leotis Pain, MD  Anesthesia:  Local  EBL:  Minimal  Indication for the Procedure:  The patient has a functional permanent dialysis access and no longer needs their permcath.  This can be removed.  Risks and benefits are discussed and informed consent is obtained.  Description of the Procedure:  The patient's right neck, chest and existing catheter were sterilely prepped and draped. The area around the catheter was anesthetized copiously with 1% lidocaine. The catheter was dissected out with curved hemostats until the cuff was freed from the surrounding fibrous sheath. The fiber sheath was transected, and the catheter was then removed in its entirety using gentle traction. Pressure was held and sterile dressings were placed. The patient tolerated the procedure well and was taken to the recovery room in stable condition.     Leotis Pain  04/12/2018, 12:01 PM This note was created with Dragon Medical transcription system. Any errors in dictation are purely unintentional.

## 2018-04-13 DIAGNOSIS — Z992 Dependence on renal dialysis: Secondary | ICD-10-CM | POA: Diagnosis not present

## 2018-04-13 DIAGNOSIS — N186 End stage renal disease: Secondary | ICD-10-CM | POA: Diagnosis not present

## 2018-04-13 DIAGNOSIS — Z23 Encounter for immunization: Secondary | ICD-10-CM | POA: Diagnosis not present

## 2018-04-14 ENCOUNTER — Ambulatory Visit (INDEPENDENT_AMBULATORY_CARE_PROVIDER_SITE_OTHER): Payer: Medicare Other | Admitting: Physician Assistant

## 2018-04-14 ENCOUNTER — Encounter: Payer: Self-pay | Admitting: Physician Assistant

## 2018-04-14 ENCOUNTER — Other Ambulatory Visit: Payer: Self-pay | Admitting: Internal Medicine

## 2018-04-14 ENCOUNTER — Other Ambulatory Visit (INDEPENDENT_AMBULATORY_CARE_PROVIDER_SITE_OTHER): Payer: Self-pay | Admitting: Vascular Surgery

## 2018-04-14 VITALS — BP 128/58 | HR 54 | Temp 98.2°F | Resp 20 | Ht 66.0 in | Wt 173.0 lb

## 2018-04-14 DIAGNOSIS — R05 Cough: Secondary | ICD-10-CM | POA: Diagnosis not present

## 2018-04-14 DIAGNOSIS — J90 Pleural effusion, not elsewhere classified: Secondary | ICD-10-CM

## 2018-04-14 DIAGNOSIS — R059 Cough, unspecified: Secondary | ICD-10-CM

## 2018-04-14 MED ORDER — BENZONATATE 100 MG PO CAPS: 100.0000 mg | ORAL_CAPSULE | Freq: Three times a day (TID) | ORAL | 0 refills | Status: DC | PRN

## 2018-04-14 MED ORDER — DOXYCYCLINE HYCLATE 100 MG PO TABS: 100.0000 mg | ORAL_TABLET | Freq: Two times a day (BID) | ORAL | 0 refills | Status: DC

## 2018-04-14 MED ORDER — IPRATROPIUM-ALBUTEROL 0.5-2.5 (3) MG/3ML IN SOLN: 3.0000 mL | Freq: Once | RESPIRATORY_TRACT | Status: DC

## 2018-04-14 NOTE — Progress Notes (Signed)
Patient: Thomas [name] Male    DOB: 10-13-1945   72 y.o.   MRN: [id number] Visit Date: 04/14/2018  Today's Provider: Mar Daring, PA-C   Chief Complaint  Patient presents with  . Cough   Subjective:     Cough  This is a new problem. The current episode started in the past 7 days. The problem has been gradually worsening. The cough is productive of sputum. Associated symptoms include chest pain, nasal congestion, postnasal drip and shortness of breath. He has tried nothing for the symptoms. His past medical history is significant for environmental allergies.    Patient reports that his cough is worse at night and has very frequent "coughing spells".   Was recently hospitalized in Nov 2019 twice, once for CHF exacerbation and second for pleural effusion with AKI and CHF exacerbation.    Allergies  Allergen Reactions  . Ciprofloxacin Itching and Swelling    Facial swelling   . Trulicity [Dulaglutide]     Bloating  . Hydralazine Nausea And Vomiting     Current Outpatient Medications:  .  acetaminophen (TYLENOL) 500 MG tablet, Take 500 mg by mouth every 8 (eight) hours as needed for mild pain or headache., Disp: , Rfl:  .  ALPRAZolam (XANAX) 0.25 MG tablet, Take 1 tablet (0.25 mg total) by mouth daily as needed for anxiety., Disp: 20 tablet, Rfl: 3 .  atorvastatin (LIPITOR) 80 MG tablet, Take 1 tablet (80 mg total) by mouth daily at 6 PM., Disp: 30 tablet, Rfl: 5 .  Blood Glucose Monitoring Suppl (ONE TOUCH ULTRA 2) w/Device KIT, Use to check gluocose daily for type 2 diabetes, Disp: 1 each, Rfl: 0 .  calcium acetate (PHOSLO) 667 MG capsule, Take 1 capsule (667 mg total) by mouth 3 (three) times daily with meals., Disp: 90 capsule, Rfl: 0 .  clopidogrel (PLAVIX) 75 MG tablet, TAKE 1 TABLET BY MOUTH ONCE DAILY, Disp: 30 tablet, Rfl: 2 .  glucose blood test strip, Contour [employer] test strips and lancets Use to check blood sugar daily for type 2 diabetes, E11.9.,  Disp: 100 each, Rfl: 4 .  insulin degludec (TRESIBA FLEXTOUCH) 100 UNIT/ML SOPN FlexTouch Pen, Inject 0.06 mLs (6 Units total) into the skin daily. Increase by 2 units a day if fasting glucose is over 200, Disp: 3 mL, Rfl: 0 .  isosorbide mononitrate (IMDUR) 60 MG 24 hr tablet, TAKE 1 TABLET BY MOUTH ONCE DAILY., Disp: 30 tablet, Rfl: 5 .  LORazepam (ATIVAN) 0.5 MG tablet, Take 1 tablet (0.5 mg total) by mouth at bedtime as needed for sleep., Disp: 30 tablet, Rfl: 3 .  losartan (COZAAR) 25 MG tablet, Take 1 tablet (25 mg total) by mouth every evening., Disp: 30 tablet, Rfl: 6 .  metoprolol succinate (TOPROL-XL) 50 MG 24 hr tablet, Take 50 mg by mouth daily. Take with or immediately following a meal., Disp: , Rfl:  .  pantoprazole (PROTONIX) 40 MG tablet, Take 1 tablet (40 mg total) by mouth daily. (Patient taking differently: Take 40 mg by mouth 2 (two) times daily. ), Disp: 30 tablet, Rfl: 12 .  sodium bicarbonate 650 MG tablet, Take 650 mg by mouth 2 (two) times daily., Disp: , Rfl:  .  tamsulosin (FLOMAX) 0.4 MG CAPS capsule, TAKE 1 CAPSULE BY MOUTH ONCE DAILY AFTERSUPPER, Disp: 30 capsule, Rfl: 5 .  torsemide (DEMADEX) 20 MG tablet, Take 2 tablets (40 mg total) by mouth 2 (two) times daily., Disp: 120 tablet,  Rfl: 0 .  traMADol (ULTRAM) 50 MG tablet, Take 1 tablet (50 mg total) by mouth at bedtime as needed., Disp: 30 tablet, Rfl: 0  Review of Systems  Constitutional: Negative.   HENT: Positive for postnasal drip.   Respiratory: Positive for cough and shortness of breath.   Cardiovascular: Positive for chest pain.  Gastrointestinal: Negative.   Allergic/Immunologic: Positive for environmental allergies.    Social History   Tobacco Use  . Smoking status: Former Smoker    Packs/day: 1.00    Years: 51.00    Pack years: 51.00    Types: Cigarettes    Last attempt to quit: 06/21/2017    Years since quitting: 0.8  . Smokeless tobacco: Never Used  . Tobacco comment: started smoking at age  [age]. Smoked for 50 years  Substance Use Topics  . Alcohol use: Yes    Alcohol/week: 0.0 standard drinks    Frequency: Never    Comment: 1 every 6 months      Objective:   BP (!) 128/58 (BP Location: Left Arm, Patient Position: Sitting, Cuff Size: Normal)   Pulse (!) 54   Temp 98.2 F (36.8 C)   Resp 20   Wt 173 lb (78.5 kg)   SpO2 94%   BMI 27.92 kg/m  Vitals:   04/14/18 1033  BP: (!) 128/58  Pulse: (!) 54  Resp: 20  Temp: 98.2 F (36.8 C)  SpO2: 94%  Weight: 173 lb (78.5 kg)     Physical Exam Vitals signs reviewed.  Constitutional:      General: He is not in acute distress.    Appearance: He is well-developed. He is not diaphoretic.  HENT:     Head: Normocephalic and atraumatic.     Right Ear: Hearing, tympanic membrane, ear canal and external ear normal. No middle ear effusion. Tympanic membrane is not erythematous or bulging.     Left Ear: Hearing, tympanic membrane, ear canal and external ear normal.  No middle ear effusion. Tympanic membrane is not erythematous or bulging.     Nose: Mucosal edema and rhinorrhea present.     Right Sinus: No maxillary sinus tenderness or frontal sinus tenderness.     Left Sinus: No maxillary sinus tenderness or frontal sinus tenderness.     Mouth/Throat:     Pharynx: Uvula midline. No oropharyngeal exudate or posterior oropharyngeal erythema.  Eyes:     General:        Right eye: No discharge.        Left eye: No discharge.     Conjunctiva/sclera: Conjunctivae normal.     Pupils: Pupils are equal, round, and reactive to light.  Neck:     Musculoskeletal: Normal range of motion and neck supple.     Thyroid: No thyromegaly.     Trachea: No tracheal deviation.     Meningeal: Brudzinski's sign and Kernig's sign absent.  Cardiovascular:     Rate and Rhythm: Normal rate and regular rhythm.     Heart sounds: Normal heart sounds. No murmur. No friction rub. No gallop.   Pulmonary:     Effort: Pulmonary effort is normal. No  respiratory distress.     Breath sounds: No stridor or decreased air movement. Examination of the right-middle field reveals rales. Examination of the left-middle field reveals rales. Examination of the right-lower field reveals rales. Examination of the left-lower field reveals rales. Wheezing and rales present. No decreased breath sounds or rhonchi.     Comments: Pleural rub heard as  well bilaterally After Duoneb treatment lung sounds had improved to only mild wheezes. O2 sat improved to 97% Lymphadenopathy:     Cervical: No cervical adenopathy.  Skin:    General: Skin is warm and dry.        Assessment & Plan    1. Pleural effusion Duoneb treatment given today in the office. O2 sats improved from 94 to 97% and lung sounds drastically improved. Will treat with doxycyline as below. Keep f/u with Dr. Caryn Section on 04/26/18 for recheck. Return precautions verbally given and voiced understanding. Call if symptoms fail to resolve.  - ipratropium-albuterol (DUONEB) 0.5-2.5 (3) MG/3ML nebulizer solution 3 mL - doxycycline (VIBRA-TABS) 100 MG tablet; Take 1 tablet (100 mg total) by mouth 2 (two) times daily.  Dispense: 20 tablet; Refill: 0  2. Cough Tessalon perles for cough as noted below.  - doxycycline (VIBRA-TABS) 100 MG tablet; Take 1 tablet (100 mg total) by mouth 2 (two) times daily.  Dispense: 20 tablet; Refill: 0 - benzonatate (TESSALON) 100 MG capsule; Take 1 capsule (100 mg total) by mouth 3 (three) times daily as needed for cough.  Dispense: 30 capsule; Refill: 0     Mar Daring, PA-C  [facility]

## 2018-04-15 ENCOUNTER — Encounter: Payer: Self-pay | Admitting: Vascular Surgery

## 2018-04-16 DIAGNOSIS — Z992 Dependence on renal dialysis: Secondary | ICD-10-CM | POA: Diagnosis not present

## 2018-04-16 DIAGNOSIS — N186 End stage renal disease: Secondary | ICD-10-CM | POA: Diagnosis not present

## 2018-04-16 DIAGNOSIS — Z23 Encounter for immunization: Secondary | ICD-10-CM | POA: Diagnosis not present

## 2018-04-17 ENCOUNTER — Telehealth (INDEPENDENT_AMBULATORY_CARE_PROVIDER_SITE_OTHER): Payer: Self-pay | Admitting: Vascular Surgery

## 2018-04-17 DIAGNOSIS — N186 End stage renal disease: Secondary | ICD-10-CM | POA: Diagnosis not present

## 2018-04-17 DIAGNOSIS — Z992 Dependence on renal dialysis: Secondary | ICD-10-CM | POA: Diagnosis not present

## 2018-04-17 NOTE — Telephone Encounter (Signed)
Patient is aware that no other follow up is needed and he already has an appointment on 07/31/18. No questions or concerns

## 2018-04-17 NOTE — Telephone Encounter (Signed)
Please advise on below  

## 2018-04-18 DIAGNOSIS — N186 End stage renal disease: Secondary | ICD-10-CM | POA: Diagnosis not present

## 2018-04-18 DIAGNOSIS — Z23 Encounter for immunization: Secondary | ICD-10-CM | POA: Diagnosis not present

## 2018-04-18 DIAGNOSIS — Z992 Dependence on renal dialysis: Secondary | ICD-10-CM | POA: Diagnosis not present

## 2018-04-19 DIAGNOSIS — T82898A Other specified complication of vascular prosthetic devices, implants and grafts, initial encounter: Secondary | ICD-10-CM | POA: Diagnosis not present

## 2018-04-19 DIAGNOSIS — Z992 Dependence on renal dialysis: Secondary | ICD-10-CM | POA: Diagnosis not present

## 2018-04-19 DIAGNOSIS — T82848A Pain from vascular prosthetic devices, implants and grafts, initial encounter: Secondary | ICD-10-CM | POA: Diagnosis not present

## 2018-04-19 DIAGNOSIS — N186 End stage renal disease: Secondary | ICD-10-CM | POA: Diagnosis not present

## 2018-04-20 ENCOUNTER — Other Ambulatory Visit: Payer: Self-pay

## 2018-04-20 DIAGNOSIS — N186 End stage renal disease: Secondary | ICD-10-CM | POA: Diagnosis not present

## 2018-04-20 DIAGNOSIS — Z992 Dependence on renal dialysis: Secondary | ICD-10-CM | POA: Diagnosis not present

## 2018-04-20 DIAGNOSIS — Z23 Encounter for immunization: Secondary | ICD-10-CM | POA: Diagnosis not present

## 2018-04-20 NOTE — Patient Outreach (Signed)
[facility]) Care Management  04/20/2018  [name] 16-Jun-1945 [id number]   Medication Adherence call to [name] patient did not answer patient is due on Glipszide  ER 10 mg patient has not pick up from [employer]. [name] is showing past due under [place].   [facility] Management Direct Dial [phone]  Fax [phone] [email or web address]

## 2018-04-21 ENCOUNTER — Ambulatory Visit: Payer: Self-pay | Admitting: Physician Assistant

## 2018-04-23 DIAGNOSIS — Z23 Encounter for immunization: Secondary | ICD-10-CM | POA: Diagnosis not present

## 2018-04-23 DIAGNOSIS — Z992 Dependence on renal dialysis: Secondary | ICD-10-CM | POA: Diagnosis not present

## 2018-04-23 DIAGNOSIS — N186 End stage renal disease: Secondary | ICD-10-CM | POA: Diagnosis not present

## 2018-04-25 DIAGNOSIS — Z992 Dependence on renal dialysis: Secondary | ICD-10-CM | POA: Diagnosis not present

## 2018-04-25 DIAGNOSIS — Z23 Encounter for immunization: Secondary | ICD-10-CM | POA: Diagnosis not present

## 2018-04-25 DIAGNOSIS — N186 End stage renal disease: Secondary | ICD-10-CM | POA: Diagnosis not present

## 2018-04-26 ENCOUNTER — Encounter: Payer: Self-pay | Admitting: Surgery

## 2018-04-26 ENCOUNTER — Ambulatory Visit (INDEPENDENT_AMBULATORY_CARE_PROVIDER_SITE_OTHER): Payer: Medicare Other | Admitting: Family Medicine

## 2018-04-26 ENCOUNTER — Encounter: Payer: Self-pay | Admitting: Family Medicine

## 2018-04-26 ENCOUNTER — Other Ambulatory Visit: Payer: Self-pay

## 2018-04-26 ENCOUNTER — Ambulatory Visit: Payer: Medicare Other | Admitting: Surgery

## 2018-04-26 ENCOUNTER — Ambulatory Visit: Payer: Self-pay | Admitting: General Surgery

## 2018-04-26 VITALS — BP 118/64 | HR 72 | Temp 98.5°F | Resp 16 | Wt 171.0 lb

## 2018-04-26 VITALS — BP 147/90 | HR 80 | Temp 97.3°F | Resp 20 | Ht 66.0 in | Wt 171.0 lb

## 2018-04-26 DIAGNOSIS — K2210 Ulcer of esophagus without bleeding: Secondary | ICD-10-CM

## 2018-04-26 DIAGNOSIS — E1122 Type 2 diabetes mellitus with diabetic chronic kidney disease: Secondary | ICD-10-CM

## 2018-04-26 DIAGNOSIS — K432 Incisional hernia without obstruction or gangrene: Secondary | ICD-10-CM | POA: Diagnosis not present

## 2018-04-26 DIAGNOSIS — I10 Essential (primary) hypertension: Secondary | ICD-10-CM | POA: Diagnosis not present

## 2018-04-26 DIAGNOSIS — F410 Panic disorder [episodic paroxysmal anxiety] without agoraphobia: Secondary | ICD-10-CM | POA: Diagnosis not present

## 2018-04-26 DIAGNOSIS — N183 Chronic kidney disease, stage 3 unspecified: Secondary | ICD-10-CM

## 2018-04-26 DIAGNOSIS — E785 Hyperlipidemia, unspecified: Secondary | ICD-10-CM

## 2018-04-26 DIAGNOSIS — G4709 Other insomnia: Secondary | ICD-10-CM

## 2018-04-26 LAB — POCT GLYCOSYLATED HEMOGLOBIN (HGB A1C): Hemoglobin A1C: 7.3 % — AB (ref 4.0–5.6)

## 2018-04-26 LAB — POCT UA - MICROALBUMIN: Microalbumin Ur, POC: 100 mg/L

## 2018-04-26 MED ORDER — ALPRAZOLAM 0.25 MG PO TABS: ORAL_TABLET | ORAL | 3 refills | Status: DC

## 2018-04-26 MED ORDER — PANTOPRAZOLE SODIUM 40 MG PO TBEC: 40.0000 mg | DELAYED_RELEASE_TABLET | Freq: Every day | ORAL | 12 refills | Status: DC

## 2018-04-26 NOTE — Patient Instructions (Signed)
.   Please bring all of your medications to every appointment so we can make sure that our medication list is the same as yours.   

## 2018-04-26 NOTE — Patient Instructions (Addendum)
The patient is aware to call back for any questions or new concerns. Recommend once daily colace and fiber supplement to avoid constipation   May use abdominal binder for support if needed.  Hernia, Adult     A hernia is the bulging of an organ or tissue through a weak spot in the muscles of the abdomen (abdominal wall). Hernias develop most often near the belly button (navel) or the area where the leg meets the lower abdomen (groin). Common types of hernias include:  Incisional hernia. This type bulges through a scar from an abdominal surgery.  Umbilical hernia. This type develops near the navel.  Inguinal hernia. This type develops in the groin or scrotum.  Femoral hernia. This type develops under the groin, in the upper thigh area.  Hiatal hernia. This type occurs when part of the stomach slides above the muscle that separates the abdomen from the chest (diaphragm). What are the causes? This condition may be caused by:  Heavy lifting.  Coughing over a long period of time.  Straining to have a bowel movement. Constipation can lead to straining.  An incision made during an abdominal surgery.  A physical problem that is present at birth (congenital defect).  Being overweight or obese.  Smoking.  Excess fluid in the abdomen.  Undescended testicles in males. What are the signs or symptoms? The main symptom is a skin-colored, rounded bulge in the area of the hernia. However, a bulge may not always be present. It may grow bigger or be more visible when you cough or strain (such as when lifting something heavy). A hernia that can be pushed back into the area (is reducible) rarely causes pain. A hernia that cannot be pushed back into the area (is incarcerated) may lose its blood supply (become strangulated). A hernia that is incarcerated may cause:  Pain.  Fever.  Nausea and vomiting.  Swelling.  Constipation. How is this diagnosed? A hernia may be diagnosed based  on:  Your symptoms and medical history.  A physical exam. Your health care provider may ask you to cough or move in certain ways to see if the hernia becomes visible.  Imaging tests, such as: ? X-rays. ? Ultrasound. ? CT scan. How is this treated? A hernia that is small and painless may not need to be treated. A hernia that is large or painful may be treated with surgery. Inguinal hernias may be treated with surgery to prevent incarceration or strangulation. Strangulated hernias are always treated with surgery because a lack of blood supply to the trapped organ or tissue can cause it to die. Surgery to treat a hernia involves pushing the bulge back into place and repairing the weak area of the muscle or abdominal wall. Follow these instructions at home: Activity  Avoid straining.  Do not lift anything that is heavier than 10 lb (4.5 kg), or the limit that you are told, until your health care provider says that it is safe.  When lifting heavy objects, lift with your leg muscles, not your back muscles. Preventing constipation  Take actions to prevent constipation. Constipation leads to straining with bowel movements, which can make a hernia worse or cause a hernia repair to break down. Your health care provider may recommend that you: ? Drink enough fluid to keep your urine pale yellow. ? Eat foods that are high in fiber, such as fresh fruits and vegetables, whole grains, and beans. ? Limit foods that are high in fat and processed sugars, such  as fried or sweet foods. ? Take an over-the-counter or prescription medicine for constipation. General instructions  When coughing, try to cough gently.  You may try to push the hernia back in place by very gently pressing on it while lying down. Do not try to force the bulge back in if it will not push in easily.  If you are overweight, work with your health care provider to lose weight safely.  Do not use any products that contain nicotine  or tobacco, such as cigarettes and e-cigarettes. If you need help quitting, ask your health care provider.  If you are scheduled for hernia repair, watch your hernia for any changes in shape, size, or color. Tell your health care provider about any changes or new symptoms.  Take over-the-counter and prescription medicines only as told by your health care provider.  Keep all follow-up visits as told by your health care provider. This is important. Contact a health care provider if:  You develop new pain, swelling, or redness around your hernia.  You have signs of constipation, such as: ? Fewer bowel movements in a week than normal. ? Difficulty having a bowel movement. ? Stools that are dry, hard, or larger than normal. Get help right away if:  You have a fever.  You have abdomen pain that gets worse.  You feel nauseous or you vomit.  You cannot push the hernia back in place by very gently pressing on it while lying down. Do not try to force the bulge back in if it will not push in easily.  The hernia: ? Changes in shape, size, or color. ? Feels hard or tender. These symptoms may represent a serious problem that is an emergency. Do not wait to see if the symptoms will go away. Get medical help right away. Call your local emergency services (911 in the [place] Summary  A hernia is the bulging of an organ or tissue through a weak spot in the muscles of the abdomen (abdominal wall).  The main symptom is a skin-colored, rounded lump (bulge) in the hernia area. However, a bulge may not always be present. It may grow bigger or more visible when you cough or strain (such as when having a bowel movement).  A hernia that is small and painless may not need to be treated. A hernia that is large or painful may be treated with surgery.  Surgery to treat a hernia involves pushing the bulge back into place and repairing the weak part of the abdomen. This information is not intended to replace  advice given to you by your health care provider. Make sure you discuss any questions you have with your health care provider. Document Released: 04/04/2005 Document Revised: 01/04/2017 Document Reviewed: 01/04/2017 Elsevier Interactive Patient Education  2019 [employer].

## 2018-04-26 NOTE — Progress Notes (Signed)
Surgical Clinic History and Physical  Referring provider:  Birdie Sons, MD [address] [place], [place] [address]  HISTORY OF PRESENT ILLNESS (HPI):  73 y.o. male presents for evaluation of his supra-umbilical bulge. 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 He says his hernias have been present >10 years and can protrude and be a "bit sore" when he becomes constipated. He describes that he also frequently feels thirsty and thinks dehydration may contribute to his constipation, but the amount of liquids he can drink is restricted due to ESRD on HD. Over the past 2 days, he has had diarrhea, but this seems to have resolved.   Of note, after having experienced his first MI during the 1980's, he then underwent CABG during the 1990's, after which he again experienced another MI this past February (2019), for which he underwent PCI and was prescribed Plavix, also prescribed for PAD stents that were placed. His LVEF is reportedly 20%, and he becomes SOB with laying down or bending over.  PAST MEDICAL HISTORY (PMH):  Past Medical History:  Diagnosis Date  . Allergy   . Arthritis   . CAD (coronary artery disease)   . Chronic kidney disease   . Diabetes mellitus without complication ([place])   . Dyspnea   . Erosive esophagitis   . ESRD (end stage renal disease) on dialysis ([place]) 2019   MWF, Left arm fistula  . GERD (gastroesophageal reflux disease)   . Gouty arthropathy   . History of colonic polyps   . History of kidney stones   . History of MRSA infection   . Hyperkalemia   . Hyperlipidemia   . Hypertension   . Melanoma ([place])   . Myocardial infarction ([place]) 1986, 2019  . Peripheral vascular disease ([place])   . 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 Trigger finger of left hand   . Ureteral tumor       PAST SURGICAL HISTORY [facility]):  Past Surgical History:  Procedure Laterality Date  . A/V SHUNT INTERVENTION Left 03/12/2018   Procedure: A/V SHUNT INTERVENTION;  Surgeon: Algernon Huxley, MD;  Location: [place];  Service: Cardiovascular;  Laterality: Left;  . AV FISTULA PLACEMENT Left 12/07/2017   Procedure: ARTERIOVENOUS (AV) FISTULA CREATION ( BRACHIOBASILIC STAGE );  Surgeon: Algernon Huxley, MD;  Location: [facility];  Service: Vascular;  Laterality: Left;  . CATARACT EXTRACTION    . CORONARY ARTERY BYPASS GRAFT  1996  . CORONARY STENT INTERVENTION N/A 05/29/2017   Procedure: CORONARY STENT INTERVENTION;  Surgeon: Isaias Cowman, MD;  Location: [facility] [facility];  Service: Cardiovascular;  Laterality: N/A;  . DIALYSIS/PERMA CATHETER REMOVAL N/A 04/12/2018   Procedure: DIALYSIS/PERMA CATHETER REMOVAL;  Surgeon: Algernon Huxley, MD;  Location: [place] [facility];  Service: Cardiovascular;  Laterality: N/A;  . EXCISION Ureteral Tumor  2010?   (benign) Dr. Quillian Quince  . EYE SURGERY Bilateral    Cataract Extraction with IOL  . LEFT HEART CATH AND CORONARY ANGIOGRAPHY N/A 10/18/2016   Procedure: Left Heart Cath and Coronary Angiography;  Surgeon: Isaias Cowman, MD;  Location: [facility];  Service: Cardiovascular;  Laterality: N/A;  . LEFT HEART CATH AND CORONARY ANGIOGRAPHY N/A 05/29/2017   Procedure: LEFT HEART CATH AND CORONARY ANGIOGRAPHY;  Surgeon: Isaias Cowman, MD;  Location: [place] [facility];  Service:  Cardiovascular;  Laterality: N/A;  . LOWER EXTREMITY ANGIOGRAPHY Right 10/31/2016   Procedure: Lower Extremity Angiography;  Surgeon: Algernon Huxley, MD;  Location: [place] [facility];  Service: Cardiovascular;  Laterality: Right;  . LOWER EXTREMITY ANGIOGRAPHY Left 11/14/2016   Procedure: Lower Extremity Angiography;  Surgeon: Algernon Huxley, MD;  Location: [place] [facility];  Service: Cardiovascular;  Laterality: Left;  . LOWER EXTREMITY INTERVENTION   11/14/2016   Procedure: Lower Extremity Intervention;  Surgeon: Algernon Huxley, MD;  Location: [facility];  Service: Cardiovascular;;  . open heart surgery    . RETINAL DETACHMENT SURGERY Right November 2107     MEDICATIONS:  Prior to Admission medications   Medication Sig Start Date End Date Taking? Authorizing Provider  acetaminophen (TYLENOL) 500 MG tablet Take 500 mg by mouth every 8 (eight) hours as needed for mild pain or headache.   Yes [provider]  ALPRAZolam Duanne Moron) 0.25 MG tablet Take one daily and one at bedtime as needed 04/26/18  Yes Fisher, Kirstie Peri, MD  atorvastatin (LIPITOR) 80 MG tablet Take 1 tablet (80 mg total) by mouth daily at 6 PM. 03/26/18  Yes Fisher, Kirstie Peri, MD  Blood Glucose Monitoring Suppl (ONE TOUCH ULTRA 2) w/Device KIT Use to check gluocose daily for type 2 diabetes 12/23/17  Yes Birdie Sons, MD  calcium acetate (PHOSLO) 667 MG capsule TAKE 1 CAPSULE BY MOUTH 3 TIMES DAILY WITH MEALS 04/16/18  Yes Birdie Sons, MD  clopidogrel (PLAVIX) 75 MG tablet TAKE 1 TABLET BY MOUTH ONCE DAILY 01/09/18  Yes Dew, Erskine Squibb, MD  glucose blood test strip Contour Ascensia test strips and lancets Use to check blood sugar daily for type 2 diabetes, E11.9. 12/22/17  Yes Fisher, Kirstie Peri, MD  insulin degludec (TRESIBA FLEXTOUCH) 100 UNIT/ML SOPN FlexTouch Pen Inject 0.06 mLs (6 Units total) into the skin daily. Increase by 2 units a day if fasting glucose is over 200 03/13/18  Yes Mayo, Pete Pelt, MD  isosorbide mononitrate (IMDUR) 60 MG 24 hr tablet TAKE 1 TABLET BY MOUTH ONCE DAILY. 01/08/18  Yes Birdie Sons, MD  LORazepam (ATIVAN) 0.5 MG tablet Take 1 tablet (0.5 mg total) by mouth at bedtime as needed for sleep. 03/27/18  Yes Birdie Sons, MD  losartan (COZAAR) 25 MG tablet Take 1 tablet (25 mg total) by mouth every evening. 04/10/18  Yes Birdie Sons, MD  metoprolol succinate (TOPROL-XL) 50 MG 24 hr tablet Take 50 mg by mouth daily. Take with or  immediately following a meal.   Yes [provider]  pantoprazole (PROTONIX) 40 MG tablet Take 1 tablet (40 mg total) by mouth daily. 04/26/18  Yes Birdie Sons, MD  polycarbophil (FIBERCON) 625 MG tablet Take 625 mg by mouth as needed.   Yes [provider]  sodium bicarbonate 650 MG tablet Take 650 mg by mouth 2 (two) times daily.   Yes [provider]  tamsulosin (FLOMAX) 0.4 MG CAPS capsule TAKE 1 CAPSULE BY MOUTH ONCE DAILY AFTERSUPPER 01/16/18  Yes Birdie Sons, MD  torsemide (DEMADEX) 100 MG tablet Take 100 mg by mouth daily. Do not take on dialysis days   Yes [provider]  traMADol (ULTRAM) 50 MG tablet Take 1 tablet (50 mg total) by mouth at bedtime as needed. 04/12/18  Yes Algernon Huxley, MD  traZODone (DESYREL) 50 MG tablet Take 50 mg by mouth at bedtime.   Yes [provider]  ALLERGIES:  Allergies  Allergen Reactions  . Ciprofloxacin Itching and Swelling    Facial swelling   . Hydralazine Nausea And Vomiting  . Trulicity [Dulaglutide]     Bloating     SOCIAL HISTORY:  Social History   Socioeconomic History  . Marital status: Married    Spouse name: Not on file  . Number of children: 2  . Years of education: Not on file  . Highest education level: Some college, no degree  Occupational History  . Occupation: Retired  Scientific laboratory technician  . Financial resource strain: Not hard at all  . Food insecurity:    Worry: Never true    Inability: Never true  . Transportation needs:    Medical: No    Non-medical: No  Tobacco Use  . Smoking status: Former Smoker    Packs/day: 1.00    Years: 51.00    Pack years: 51.00    Types: Cigarettes    Last attempt to quit: 06/21/2017    Years since quitting: 0.8  . Smokeless tobacco: Never Used  . Tobacco comment: started smoking at age [age]. Smoked for 50 years  Substance and Sexual Activity  . Alcohol use: Not Currently    Alcohol/week: 0.0 standard drinks    Frequency: Never     Comment: 1 every 6 months  . Drug use: No  . Sexual activity: Not on file  Lifestyle  . Physical activity:    Days per week: Not on file    Minutes per session: Not on file  . Stress: Not at all  Relationships  . Social connections:    Talks on phone: Not on file    Gets together: Not on file    Attends religious service: Not on file    Active member of club or organization: Not on file    Attends meetings of clubs or organizations: Not on file    Relationship status: Not on file  . Intimate partner violence:    Fear of current or ex partner: Not on file    Emotionally abused: Not on file    Physically abused: Not on file    Forced sexual activity: Not on file  Other Topics Concern  . Not on file  Social History Narrative  . Not on file    The patient currently resides (home / rehab facility / nursing home): Home The patient normally is (ambulatory / bedbound): Limited ambulation  FAMILY HISTORY:  Family History  Problem Relation Age of Onset  . Alzheimer's disease Mother   . Alzheimer's disease Brother   . Lung cancer Paternal Grandfather     Otherwise negative/non-contributory.  REVIEW OF SYSTEMS:  Constitutional: denies any other weight loss, fever, chills, or sweats  Eyes: denies any other vision changes, history of eye injury  ENT: denies sore throat, hearing problems  Respiratory: shortness of breath as per HPI, denies wheezing  Cardiovascular: denies chest pain, palpitations  Gastrointestinal: abdominal pain, N/V, and bowel function as per HPI Musculoskeletal: denies any other joint pains or cramps  Skin: Denies any other rashes or skin discolorations Neurological: denies any other headache, dizziness, weakness  Psychiatric: Denies any other depression, anxiety   All other review of systems were otherwise negative   VITAL SIGNS:  BP (!) 147/90   Pulse 80   Temp (!) 97.3 F (36.3 C) (Skin)   Resp 20   Ht 5' 6"  (1.676 m)   Wt 171 lb (77.6 kg)   SpO2  97%  BMI 27.60 kg/m   PHYSICAL EXAM:  Constitutional:  -- Normal body habitus  -- Awake, alert, and oriented x3  Eyes:  -- Pupils equally round and reactive to light  -- No scleral icterus  Ear, nose, throat:  -- No jugular venous distension -- No nasal drainage, bleeding Pulmonary:  -- No crackles  -- Equal breath sounds bilaterally -- Breathing non-labored at rest Cardiovascular:  -- S1, S2 present  -- No pericardial rubs  Gastrointestinal:  -- Abdomen soft, nontender, non-distended, no guarding/rebound  -- Easily reducible and non-tender to palpation reducible peri- and supra- umbilical incisional hernias -- No other abdominal masses appreciated, pulsatile or otherwise  Musculoskeletal and Integumentary:  -- Wounds or skin discoloration: None appreciated except well-healed chronic post-surgical scars -- Extremities: B/L UE and LE FROM, hands and feet warm, no edema  Neurologic:  -- Motor function: Intact and symmetric -- Sensation: Intact and symmetric  Labs:  CBC Latest Ref Rng & Units 05/15/2018 03/17/2018 03/13/2018  WBC 3.4 - 10.8 x10E3/uL 9.5 8.4 8.0  Hemoglobin 13.0 - 17.7 g/dL 11.7(L) 9.6(L) 8.2(L)  Hematocrit 37.5 - 51.0 % 36.7(L) 31.1(L) 27.1(L)  Platelets 150 - 450 x10E3/uL 328 283 292   CMP Latest Ref Rng & Units 03/18/2018 03/17/2018 03/13/2018  Glucose 70 - 99 mg/dL 167(H) 182(H) 127(H)  BUN 8 - 23 mg/dL 39(H) 30(H) 37(H)  Creatinine 0.61 - 1.24 mg/dL 3.87(H) 3.02(H) 3.42(H)  Sodium 135 - 145 mmol/L 136 136 137  Potassium 3.5 - 5.1 mmol/L 4.9 4.6 4.5  Chloride 98 - 111 mmol/L 100 99 101  CO2 22 - 32 mmol/L 29 29 28   Calcium 8.9 - 10.3 mg/dL 8.2(L) 8.0(L) 8.2(L)  Total Protein 6.5 - 8.1 g/dL - - -  Total Bilirubin 0.3 - 1.2 mg/dL - - -  Alkaline Phos 38 - 126 U/L - - -  AST 15 - 41 U/L - - -  ALT 0 - 44 U/L - - -   Imaging studies:  CT Abdomen and Pelvis without Contrast (03/09/2018) - personally reviewed and discussed with patient and his  family Mild right pleural effusion.  Multiple fat containing ventral hernias are noted in the periumbilical and supraumbilical regions.  Stable mild prostatic enlargement.  No acute abnormality seen in the abdomen or pelvis.   Assessment/Plan:  73 y.o. 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   abd binder, risks discussed, and university referral if wants repaired             - discussed with patient signs and symptoms of hernia incarceration and obstruction             - strategies for manual self-reduction of patient's incisional ventral hernias also reviewed and discussed  - maintain hydration (balanced with fluid restrictions per nephrology) with high fiber heart healthy diet (and/or fiber supplement as patient acknowledges having been previously advised, but he does not like) to reduce/minimize constipation, along with a once daily daily stool softener             - all risks, benefits, and alternatives to repair of patient's ventral incisional hernias were discussed with the patient, all of his  and his family's questions were answered to their expressed satisfaction, and patient concludes that he is not likely healthy enough to undergo elective surgery             - if patient wishes to pursue elective repair of his chronic reducible ventral incisional hernias, advised would [facility] to [facility] for peri-op management of severe extensive comorbidities             - consider velcro elastic abdominal binder, to be applied in morning and removed before bed, for  symptomatic relief             - instructed to call if any questions or concerns  All of the above recommendations were discussed with the patient and patient's family, and all of patient's and family's questions were answered to their expressed satisfaction.  Thank you for the opportunity to participate in this patient's care.  -- Marilynne Drivers Rosana Hoes, MD, [place]: [facility] - Partnering for exceptional care. Office: [phone]

## 2018-04-26 NOTE — Progress Notes (Signed)
Patient: Thomas [name] Male    DOB: 03-09-1946   73 y.o.   MRN: [id number] Visit Date: 04/26/2018  Today's Provider: Lelon Huh, MD   Chief Complaint  Patient presents with  . Diabetes  . Hypertension  . Hyperlipidemia  . Insomnia  . Panic Attack   Subjective:     Insomnia  Primary symptoms: fragmented sleep.  The problem has been gradually improving (Pt states he restarting trazodone 70m at bedtime) since onset. Past treatments include medication. PMH includes: no apnea.  Anxiety  Presents for follow-up visit. Symptoms include excessive worry, insomnia, nausea, nervous/anxious behavior and shortness of breath. Patient reports no chest pain, decreased concentration, depressed mood, palpitations or panic.    He states if he feels nervous at bedtime he will take alprazolam, otherwise he will take trazodone, but not both. States this regiment seems to be working well and wishes to continue.  He still takes alprazolam before dialysis which he states has been very helpful. Is fatigued, but otherwise is tolerating dialysis well.    Diabetes Mellitus Type II, Follow-up:   Lab Results  Component Value Date   HGBA1C 9.3 (A) 01/24/2018   HGBA1C 10.9 (A) 12/13/2017   HGBA1C 9.4 (A) 09/12/2017   Last seen for diabetes 2 months ago.  Management since then includes Stopped Glipizide and reduce Tresiba to 6 units a day. He reports excellent compliance with treatment. He is not having side effects.  Current symptoms include nausea and have been stable. Home blood sugar records: Low to mid 100's  Episodes of hypoglycemia? no   Current Insulin Regimen: 6 Units of Tresiba a day Most Recent Eye Exam: Has apt 04/2018 Weight trend: stable Prior visit with dietician: no Current diet: in general, a "healthy" diet   Current exercise: none  ------------------------------------------------------------------------   Hypertension, follow-up:  BP Readings from Last 3  Encounters:  04/26/18 118/64  04/14/18 (!) 128/58  04/12/18 133/73    .He reports excellent compliance with treatment. He is not having side effects.  He is not exercising. He is adherent to low salt diet.   .Marland KitchenHe is experiencing lower extremity edema.  Patient denies chest pain, exertional chest pressure/discomfort, syncope and tachypnea.   Cardiovascular risk factors include advanced age (older than 530for men, 643for women), diabetes mellitus, dyslipidemia, hypertension and male gender.  Use of agents associated with hypertension: none.   ------------------------------------------------------------------------    Lipid/Cholesterol, Follow-up:   Last seen for this 9 months ago.  Management since that visit includes No changes.  Last Lipid Panel:    Component Value Date/Time   CHOL 140 06/30/2017 0816   TRIG 115 06/30/2017 0816   HDL 47 06/30/2017 0816   CHOLHDL 3.0 06/30/2017 0816   LDLCALC 70 06/30/2017 0816    He reports excellent compliance with treatment. He is not having side effects.   Wt Readings from Last 3 Encounters:  04/26/18 171 lb (77.6 kg)  04/14/18 173 lb (78.5 kg)  03/27/18 174 lb (78.9 kg)    ------------------------------------------------------------------------    Allergies  Allergen Reactions  . Ciprofloxacin Itching and Swelling    Facial swelling   . Trulicity [Dulaglutide]     Bloating  . Hydralazine Nausea And Vomiting     Current Outpatient Medications:  .  acetaminophen (TYLENOL) 500 MG tablet, Take 500 mg by mouth every 8 (eight) hours as needed for mild pain or headache., Disp: , Rfl:  .  ALPRAZolam (XANAX) 0.25 MG tablet,  Take 1 tablet (0.25 mg total) by mouth daily as needed for anxiety., Disp: 20 tablet, Rfl: 3 .  atorvastatin (LIPITOR) 80 MG tablet, Take 1 tablet (80 mg total) by mouth daily at 6 PM., Disp: 30 tablet, Rfl: 5 .  benzonatate (TESSALON) 100 MG capsule, Take 1 capsule (100 mg total) by mouth 3 (three) times  daily as needed for cough., Disp: 30 capsule, Rfl: 0 .  Blood Glucose Monitoring Suppl (ONE TOUCH ULTRA 2) w/Device KIT, Use to check gluocose daily for type 2 diabetes, Disp: 1 each, Rfl: 0 .  clopidogrel (PLAVIX) 75 MG tablet, TAKE 1 TABLET BY MOUTH ONCE DAILY, Disp: 30 tablet, Rfl: 2 .  glucose blood test strip, Contour [employer] test strips and lancets Use to check blood sugar daily for type 2 diabetes, E11.9., Disp: 100 each, Rfl: 4 .  insulin degludec (TRESIBA FLEXTOUCH) 100 UNIT/ML SOPN FlexTouch Pen, Inject 0.06 mLs (6 Units total) into the skin daily. Increase by 2 units a day if fasting glucose is over 200, Disp: 3 mL, Rfl: 0 .  isosorbide mononitrate (IMDUR) 60 MG 24 hr tablet, TAKE 1 TABLET BY MOUTH ONCE DAILY., Disp: 30 tablet, Rfl: 5 .  LORazepam (ATIVAN) 0.5 MG tablet, Take 1 tablet (0.5 mg total) by mouth at bedtime as needed for sleep., Disp: 30 tablet, Rfl: 3 .  losartan (COZAAR) 25 MG tablet, Take 1 tablet (25 mg total) by mouth every evening., Disp: 30 tablet, Rfl: 6 .  metoprolol succinate (TOPROL-XL) 50 MG 24 hr tablet, Take 50 mg by mouth daily. Take with or immediately following a meal., Disp: , Rfl:  .  sodium bicarbonate 650 MG tablet, Take 650 mg by mouth 2 (two) times daily., Disp: , Rfl:  .  tamsulosin (FLOMAX) 0.4 MG CAPS capsule, TAKE 1 CAPSULE BY MOUTH ONCE DAILY AFTERSUPPER, Disp: 30 capsule, Rfl: 5 .  torsemide (DEMADEX) 100 MG tablet, Take 100 mg by mouth daily. Do not take on dialysis days, Disp: , Rfl:  .  traMADol (ULTRAM) 50 MG tablet, Take 1 tablet (50 mg total) by mouth at bedtime as needed., Disp: 30 tablet, Rfl: 0 .  traZODone (DESYREL) 50 MG tablet, Take 50 mg by mouth at bedtime., Disp: , Rfl:  .  calcium acetate (PHOSLO) 667 MG capsule, TAKE 1 CAPSULE BY MOUTH 3 TIMES DAILY WITH MEALS (Patient not taking: Reported on 04/26/2018), Disp: 90 capsule, Rfl: 5 .  pantoprazole (PROTONIX) 40 MG tablet, Take 1 tablet (40 mg total) by mouth daily. (Patient not taking:  Reported on 04/26/2018), Disp: 30 tablet, Rfl: 12  Current Facility-Administered Medications:  .  ipratropium-albuterol (DUONEB) 0.5-2.5 (3) MG/3ML nebulizer solution 3 mL, 3 mL, Nebulization, Once, Burnette, Jennifer M, PA-C  Review of Systems  Constitutional: Positive for fatigue. Negative for activity change, appetite change, chills, diaphoresis, fever and unexpected weight change.  Respiratory: Positive for shortness of breath. Negative for apnea, cough, choking, chest tightness, wheezing and stridor.   Cardiovascular: Positive for leg swelling. Negative for chest pain and palpitations.  Gastrointestinal: Positive for abdominal distention, diarrhea, nausea and vomiting (Only on Monday). Negative for abdominal pain, anal bleeding, blood in stool, constipation and rectal pain.  Endocrine: Negative.   Neurological: Negative for light-headedness and headaches.  Psychiatric/Behavioral: Negative for decreased concentration. The patient is nervous/anxious and has insomnia.     Social History   Tobacco Use  . Smoking status: Former Smoker    Packs/day: 1.00    Years: 51.00    Pack years: 51.00  Types: Cigarettes    Last attempt to quit: 06/21/2017    Years since quitting: 0.8  . Smokeless tobacco: Never Used  . Tobacco comment: started smoking at age [age]. Smoked for 50 years  Substance Use Topics  . Alcohol use: Yes    Alcohol/week: 0.0 standard drinks    Frequency: Never    Comment: 1 every 6 months      Objective:   BP 118/64 (BP Location: Right Arm, Patient Position: Sitting, Cuff Size: Normal)   Pulse 72   Temp 98.5 F (36.9 C) (Oral)   Resp 16   Wt 171 lb (77.6 kg)   BMI 27.60 kg/m  Vitals:   04/26/18 0838  BP: 118/64  Pulse: 72  Resp: 16  Temp: 98.5 F (36.9 C)  TempSrc: Oral  Weight: 171 lb (77.6 kg)     Physical Exam  General Appearance:    Alert, cooperative, no distress, obese  Eyes:    PERRL, conjunctiva/corneas clear, EOM's intact       Lungs:      Clear to auscultation bilaterally, respirations unlabored  Heart:    Regular rate and rhythm  Neurologic:   Awake, alert, oriented x 3. No apparent focal neurological           defect.       Results for orders placed or performed in visit on 04/26/18  POCT glycosylated hemoglobin (Hb A1C)  Result Value Ref Range   Hemoglobin A1C 7.3 (A) 4.0 - 5.6 %  POCT UA - Microalbumin  Result Value Ref Range   Microalbumin Ur, POC 100 mg/L       Assessment & Plan    1. Essential (primary) hypertension  - torsemide (DEMADEX) 100 MG tablet; Take 100 mg by mouth daily. Do not take on dialysis days  2. Type 2 diabetes mellitus with stage 3 chronic kidney disease, without long-term current use of insulin (HCC)  - POCT glycosylated hemoglobin (Hb A1C) - POCT UA - Microalbumin  3. Hyperlipidemia, unspecified hyperlipidemia type He is tolerating atorvastatin well with no adverse effects.    4. Panic attacks Much better taking alprazolam before dialysis.  - ALPRAZolam (XANAX) 0.25 MG tablet; Take one daily and one at bedtime as needed  Dispense: 1 tablet; Refill: 3  5. Other insomnia Doing well occasional use of trazodone.   6. Erosive esophagitis He has been out of - pantoprazole (PROTONIX) 40 MG tablet; Take 1 tablet (40 mg total) by mouth daily.  Dispense: 30 tablet; Refill: 12 Refill sent to pharmacy today.      Lelon Huh, MD  [facility]

## 2018-04-27 DIAGNOSIS — N186 End stage renal disease: Secondary | ICD-10-CM | POA: Diagnosis not present

## 2018-04-27 DIAGNOSIS — Z23 Encounter for immunization: Secondary | ICD-10-CM | POA: Diagnosis not present

## 2018-04-27 DIAGNOSIS — Z992 Dependence on renal dialysis: Secondary | ICD-10-CM | POA: Diagnosis not present

## 2018-04-30 DIAGNOSIS — N186 End stage renal disease: Secondary | ICD-10-CM | POA: Diagnosis not present

## 2018-04-30 DIAGNOSIS — Z992 Dependence on renal dialysis: Secondary | ICD-10-CM | POA: Diagnosis not present

## 2018-04-30 DIAGNOSIS — Z23 Encounter for immunization: Secondary | ICD-10-CM | POA: Diagnosis not present

## 2018-05-01 ENCOUNTER — Ambulatory Visit: Payer: Self-pay | Admitting: Family Medicine

## 2018-05-02 DIAGNOSIS — N186 End stage renal disease: Secondary | ICD-10-CM | POA: Diagnosis not present

## 2018-05-02 DIAGNOSIS — Z992 Dependence on renal dialysis: Secondary | ICD-10-CM | POA: Diagnosis not present

## 2018-05-02 DIAGNOSIS — Z23 Encounter for immunization: Secondary | ICD-10-CM | POA: Diagnosis not present

## 2018-05-03 ENCOUNTER — Encounter: Payer: Self-pay | Admitting: Family Medicine

## 2018-05-03 DIAGNOSIS — H43811 Vitreous degeneration, right eye: Secondary | ICD-10-CM | POA: Diagnosis not present

## 2018-05-03 DIAGNOSIS — E113293 Type 2 diabetes mellitus with mild nonproliferative diabetic retinopathy without macular edema, bilateral: Secondary | ICD-10-CM | POA: Diagnosis not present

## 2018-05-03 LAB — HM DIABETES EYE EXAM

## 2018-05-04 DIAGNOSIS — Z23 Encounter for immunization: Secondary | ICD-10-CM | POA: Diagnosis not present

## 2018-05-04 DIAGNOSIS — N186 End stage renal disease: Secondary | ICD-10-CM | POA: Diagnosis not present

## 2018-05-04 DIAGNOSIS — Z992 Dependence on renal dialysis: Secondary | ICD-10-CM | POA: Diagnosis not present

## 2018-05-07 DIAGNOSIS — Z23 Encounter for immunization: Secondary | ICD-10-CM | POA: Diagnosis not present

## 2018-05-07 DIAGNOSIS — N186 End stage renal disease: Secondary | ICD-10-CM | POA: Diagnosis not present

## 2018-05-07 DIAGNOSIS — Z992 Dependence on renal dialysis: Secondary | ICD-10-CM | POA: Diagnosis not present

## 2018-05-09 DIAGNOSIS — Z23 Encounter for immunization: Secondary | ICD-10-CM | POA: Diagnosis not present

## 2018-05-09 DIAGNOSIS — Z992 Dependence on renal dialysis: Secondary | ICD-10-CM | POA: Diagnosis not present

## 2018-05-09 DIAGNOSIS — N186 End stage renal disease: Secondary | ICD-10-CM | POA: Diagnosis not present

## 2018-05-10 ENCOUNTER — Telehealth: Payer: Self-pay | Admitting: Family Medicine

## 2018-05-10 NOTE — Telephone Encounter (Signed)
[facility] will be faxing over some information for a prior aurth regarding pt's ALPRAZolam (XANAX) 0.25 MG tablet.  Thanks, [employer]

## 2018-05-11 DIAGNOSIS — Z992 Dependence on renal dialysis: Secondary | ICD-10-CM | POA: Diagnosis not present

## 2018-05-11 DIAGNOSIS — N186 End stage renal disease: Secondary | ICD-10-CM | POA: Diagnosis not present

## 2018-05-11 DIAGNOSIS — Z23 Encounter for immunization: Secondary | ICD-10-CM | POA: Diagnosis not present

## 2018-05-12 ENCOUNTER — Ambulatory Visit (INDEPENDENT_AMBULATORY_CARE_PROVIDER_SITE_OTHER): Payer: Medicare Other | Admitting: Family Medicine

## 2018-05-12 ENCOUNTER — Encounter: Payer: Self-pay | Admitting: Family Medicine

## 2018-05-12 VITALS — BP 135/74 | HR 63 | Temp 98.0°F | Resp 16 | Wt 170.0 lb

## 2018-05-12 DIAGNOSIS — N185 Chronic kidney disease, stage 5: Secondary | ICD-10-CM

## 2018-05-12 DIAGNOSIS — M25561 Pain in right knee: Secondary | ICD-10-CM

## 2018-05-12 DIAGNOSIS — M25461 Effusion, right knee: Secondary | ICD-10-CM

## 2018-05-12 MED ORDER — CEPHALEXIN 500 MG PO CAPS: 500.0000 mg | ORAL_CAPSULE | Freq: Three times a day (TID) | ORAL | 0 refills | Status: DC

## 2018-05-12 MED ORDER — PREDNISONE 5 MG PO TABS: 5.0000 mg | ORAL_TABLET | Freq: Every day | ORAL | 0 refills | Status: DC

## 2018-05-12 NOTE — Progress Notes (Signed)
Thomas [name]  MRN: [id number] DOB: 11-15-1945  Subjective:  HPI   The patient is a 73 year old male who presents today for evaluation of right knee pain and swelling.  He states it has been coming on for a couple of weeks.  He usually can get it better using an anti-inflammatory however, this time he has been unable to get the swelling down.  It has had warmth in the knee as well.  Past Medical History:  Diagnosis Date  . Allergy   . Arthritis   . CAD (coronary artery disease)   . Chronic kidney disease   . Diabetes mellitus without complication ([place])   . Dyspnea   . Erosive esophagitis   . ESRD (end stage renal disease) on dialysis ([place]) 2019   MWF, Left arm fistula  . GERD (gastroesophageal reflux disease)   . Gouty arthropathy   . History of colonic polyps   . History of kidney stones   . History of MRSA infection   . Hyperkalemia   . Hyperlipidemia   . Hypertension   . Melanoma ([place])   . Myocardial infarction ([place]) 1986, 2019  . Peripheral vascular disease ([place])   . 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 Trigger finger of left hand   . Ureteral tumor     Patient Active Problem List   Diagnosis Date Noted  . Dependence on hemodialysis ([place]) 03/27/2018  . Panic attacks 03/27/2018  . Other insomnia 03/27/2018  . Acute respiratory failure ([place]) 02/24/2018  . PAD (peripheral artery disease) ([place]) 11/17/2017  . Chest pain 07/17/2017  . Emphysema lung ([place]) 07/17/2017  . Acute on chronic systolic CHF (congestive heart failure), NYHA class 2 ([place]) 06/22/2017  . CKD (chronic kidney disease) stage 5, GFR less than 15 ml/min (HCC) 06/20/2017  . Systolic CHF ([place]) 95/28/4132  . Acute on chronic renal failure ([place]) 05/24/2017  . Abnormal ankle brachial index (ABI) 10/13/2016  . Intermittent claudication ([place]) 09/30/2016  . PVC (premature ventricular contraction) 09/30/2016  . Hyperlipidemia 09/30/2016  . Aortic atherosclerosis ([place]) 06/17/2016  .  Duodenitis 11/24/2014  . Erosive esophagitis 11/24/2014  . Personal history of methicillin resistant Staphylococcus aureus 11/24/2014  . Melanoma of skin ([place]) 11/24/2014  . Acquired trigger finger 11/24/2014  . 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 DM (diabetes mellitus), type 2 with renal complications ([place]) 44/04/270  . History of smoking 30 or more pack years 08/26/2009  . Allergic rhinitis 08/06/2006  . 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 History of colon polyps 05/09/2003  . 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 Essential (primary) hypertension 06/09/1995   Past Medical History:  Diagnosis Date  . Allergy   . Arthritis   . CAD (coronary artery disease)   . Chronic kidney disease   . Diabetes mellitus without complication ([place])   . Dyspnea   . Erosive esophagitis   . ESRD (end stage renal disease) on dialysis ([place]) 2019   MWF, Left arm fistula  . GERD (gastroesophageal reflux disease)   . Gouty arthropathy   . History of colonic polyps   . History of kidney stones   . History of MRSA infection   . Hyperkalemia   . Hyperlipidemia   . Hypertension   . Melanoma ([place])   . Myocardial infarction ([place]) 1986, 2019  . Peripheral vascular disease ([place])   . 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 Trigger finger of left hand   . Ureteral tumor    Social History   Socioeconomic History  . Marital status: Married    Spouse name: Not on file  . Number of children: 2  . Years of education: Not on file  . Highest education level: Some college, no degree  Occupational History  . Occupation: Retired  Scientific laboratory technician  . Financial resource strain: Not hard at all  . Food insecurity:    Worry: Never true    Inability: Never true  . Transportation needs:    Medical: No    Non-medical: No  Tobacco Use  . Smoking status: Former Smoker    Packs/day: 1.00    Years: 51.00    Pack years: 51.00    Types:  Cigarettes    Last attempt to quit: 06/21/2017    Years since quitting: 0.8  . Smokeless tobacco: Never Used  . Tobacco comment: started smoking at age [age]. Smoked for 50 years  Substance and Sexual Activity  . Alcohol use: Not Currently    Alcohol/week: 0.0 standard drinks    Frequency: Never    Comment: 1 every 6 months  . Drug use: No  . Sexual activity: Not on file  Lifestyle  . Physical activity:    Days per week: Not on file    Minutes per session: Not on file  . Stress: Not at all  Relationships  . Social connections:    Talks on phone: Not on file    Gets together: Not on file    Attends religious service: Not on file    Active member of club or organization: Not on file    Attends meetings of clubs or organizations: Not on file    Relationship status: Not on file  . Intimate partner violence:    Fear of current or ex partner: Not on file    Emotionally abused: Not on file    Physically abused: Not on file    Forced sexual activity: Not on file  Other Topics Concern  . Not on file  Social History Narrative  . Not on file   Outpatient Encounter Medications as of 05/12/2018  Medication Sig  . acetaminophen (TYLENOL) 500 MG tablet Take 500 mg by mouth every 8 (eight) hours as needed for mild pain or headache.  . ALPRAZolam (XANAX) 0.25 MG tablet Take one daily and one at bedtime as needed  . atorvastatin (LIPITOR) 80 MG tablet Take 1 tablet (80 mg total) by mouth daily at 6 PM.  . Blood Glucose Monitoring Suppl (ONE TOUCH ULTRA 2) w/Device KIT Use to check gluocose daily for type 2 diabetes  . calcium acetate (PHOSLO) 667 MG capsule TAKE 1 CAPSULE BY MOUTH 3 TIMES DAILY WITH MEALS  . clopidogrel (PLAVIX) 75 MG tablet TAKE 1 TABLET BY MOUTH ONCE DAILY  . glucose blood test strip Contour [employer] test strips and lancets Use to check blood sugar daily for type 2 diabetes, E11.9.  . insulin degludec (TRESIBA FLEXTOUCH) 100 UNIT/ML SOPN FlexTouch Pen Inject 0.06 mLs (6  Units total) into the skin daily. Increase by 2 units a day if fasting glucose is over 200  . isosorbide mononitrate (IMDUR) 60 MG 24 hr tablet TAKE 1 TABLET BY MOUTH ONCE DAILY.  Marland Kitchen LORazepam (ATIVAN) 0.5 MG tablet Take 1 tablet (0.5 mg total) by mouth at bedtime as needed for sleep.  Marland Kitchen losartan (COZAAR) 25 MG tablet Take 1 tablet (25 mg total) by  mouth every evening.  . metoprolol succinate (TOPROL-XL) 50 MG 24 hr tablet Take 50 mg by mouth daily. Take with or immediately following a meal.  . pantoprazole (PROTONIX) 40 MG tablet Take 1 tablet (40 mg total) by mouth daily.  . polycarbophil (FIBERCON) 625 MG tablet Take 625 mg by mouth as needed.  . sodium bicarbonate 650 MG tablet Take 650 mg by mouth 2 (two) times daily.  . tamsulosin (FLOMAX) 0.4 MG CAPS capsule TAKE 1 CAPSULE BY MOUTH ONCE DAILY AFTERSUPPER  . torsemide (DEMADEX) 100 MG tablet Take 100 mg by mouth daily. Do not take on dialysis days  . traMADol (ULTRAM) 50 MG tablet Take 1 tablet (50 mg total) by mouth at bedtime as needed.  . traZODone (DESYREL) 50 MG tablet Take 50 mg by mouth at bedtime.   No facility-administered encounter medications on file as of 05/12/2018.     Allergies  Allergen Reactions  . Ciprofloxacin Itching and Swelling    Facial swelling   . Hydralazine Nausea And Vomiting  . Trulicity [Dulaglutide]     Bloating   Review of Systems  Constitutional: Negative for fever.  Respiratory: Negative for cough, shortness of breath and wheezing.   Cardiovascular: Negative for chest pain and palpitations.  Musculoskeletal: Positive for joint pain (right knee) and myalgias (after dialysis). Negative for back pain, falls and neck pain.    Objective:  BP 135/74 (BP Location: Right Arm, Patient Position: Sitting, Cuff Size: Large)   Pulse 63   Temp 98 F (36.7 C) (Oral)   Resp 16   Wt 170 lb (77.1 kg)   BMI 27.44 kg/m   Physical Exam  Constitutional: He is oriented to person, place, and time and  well-developed, well-nourished, and in no distress.  Eyes: Conjunctivae are normal.  Neck: Neck supple.  Pulmonary/Chest: Effort normal.  Musculoskeletal:        General: Tenderness and edema present.     Comments: Very tender right lateral knee with effusion and sharp pain to palpation or testing ROM. Slightly warm but minimal redness. Pitting edema from right foot up to above the knee.   Neurological: He is alert and oriented to person, place, and time.  Psychiatric: Affect normal.    Assessment and Plan :   1. Pain and swelling of right knee Onset over the past 2 weeks. States it does not feel as bad as when he had gout in the left foot years ago. Naproxen has help with past episodes that would alternate from left to right knee. No known injury. No fever today but warm and swollen today. Will treat with antibiotic and steroid. Recommend getting x-ray and labs. - cephALEXin (KEFLEX) 500 MG capsule; Take 1 capsule (500 mg total) by mouth 3 (three) times daily.  Dispense: 21 capsule; Refill: 0 - predniSONE (DELTASONE) 5 MG tablet; Take 1 tablet (5 mg total) by mouth daily with breakfast. Taper down by one tablet daily starting at 6 by mouth the first day (6,5,4,3,2,1)  Dispense: 21 tablet; Refill: 0 - CBC with Differential/Platelet; Future - Uric acid; Future - DG Knee Complete 4 Views Right; Future  2. CKD (chronic kidney disease) stage 5, GFR less than 15 ml/min (HCC) Followed by nephrologist and gets dialysis M,W,F each week for the past 8-9 weeks.

## 2018-05-14 DIAGNOSIS — N186 End stage renal disease: Secondary | ICD-10-CM | POA: Diagnosis not present

## 2018-05-14 DIAGNOSIS — Z23 Encounter for immunization: Secondary | ICD-10-CM | POA: Diagnosis not present

## 2018-05-14 DIAGNOSIS — Z992 Dependence on renal dialysis: Secondary | ICD-10-CM | POA: Diagnosis not present

## 2018-05-15 ENCOUNTER — Ambulatory Visit
Admission: RE | Admit: 2018-05-15 | Discharge: 2018-05-15 | Disposition: A | Discharge status: Home / Self Care | Payer: Medicare Other | Attending: Family Medicine | Admitting: Family Medicine

## 2018-05-15 ENCOUNTER — Ambulatory Visit
Admission: RE | Admit: 2018-05-15 | Discharge: 2018-05-15 | Disposition: A | Discharge status: Home / Self Care | Payer: Medicare Other | Source: Ambulatory Visit | Attending: Family Medicine | Admitting: Family Medicine

## 2018-05-15 ENCOUNTER — Other Ambulatory Visit: Payer: Self-pay | Admitting: Family Medicine

## 2018-05-15 ENCOUNTER — Other Ambulatory Visit: Payer: Self-pay

## 2018-05-15 DIAGNOSIS — M25461 Effusion, right knee: Secondary | ICD-10-CM | POA: Diagnosis not present

## 2018-05-15 DIAGNOSIS — M25561 Pain in right knee: Secondary | ICD-10-CM | POA: Diagnosis not present

## 2018-05-15 DIAGNOSIS — M7989 Other specified soft tissue disorders: Secondary | ICD-10-CM | POA: Diagnosis not present

## 2018-05-15 DIAGNOSIS — M25569 Pain in unspecified knee: Secondary | ICD-10-CM | POA: Diagnosis not present

## 2018-05-16 DIAGNOSIS — N186 End stage renal disease: Secondary | ICD-10-CM | POA: Diagnosis not present

## 2018-05-16 DIAGNOSIS — Z23 Encounter for immunization: Secondary | ICD-10-CM | POA: Diagnosis not present

## 2018-05-16 DIAGNOSIS — Z992 Dependence on renal dialysis: Secondary | ICD-10-CM | POA: Diagnosis not present

## 2018-05-16 LAB — CBC WITH DIFFERENTIAL/PLATELET
Basophils Absolute: 0 10*3/uL (ref 0.0–0.2)
Basos: 0 %
EOS (ABSOLUTE): 0 10*3/uL (ref 0.0–0.4)
Eos: 0 %
HEMATOCRIT: 36.7 % — AB (ref 37.5–51.0)
Hemoglobin: 11.7 g/dL — ABNORMAL LOW (ref 13.0–17.7)
IMMATURE GRANS (ABS): 0 10*3/uL (ref 0.0–0.1)
Immature Granulocytes: 0 %
Lymphocytes Absolute: 1.2 10*3/uL (ref 0.7–3.1)
Lymphs: 13 %
MCH: 28.5 pg (ref 26.6–33.0)
MCHC: 31.9 g/dL (ref 31.5–35.7)
MCV: 89 fL (ref 79–97)
MONOCYTES: 11 %
Monocytes Absolute: 1.1 10*3/uL — ABNORMAL HIGH (ref 0.1–0.9)
Neutrophils Absolute: 7.1 10*3/uL — ABNORMAL HIGH (ref 1.4–7.0)
Neutrophils: 76 %
Platelets: 328 10*3/uL (ref 150–450)
RBC: 4.11 x10E6/uL — ABNORMAL LOW (ref 4.14–5.80)
RDW: 17.2 % — ABNORMAL HIGH (ref 11.6–15.4)
WBC: 9.5 10*3/uL (ref 3.4–10.8)

## 2018-05-16 LAB — URIC ACID: Uric Acid: 4.3 mg/dL (ref 3.7–8.6)

## 2018-05-17 ENCOUNTER — Encounter: Payer: Self-pay | Admitting: Surgery

## 2018-05-18 DIAGNOSIS — Z23 Encounter for immunization: Secondary | ICD-10-CM | POA: Diagnosis not present

## 2018-05-18 DIAGNOSIS — N186 End stage renal disease: Secondary | ICD-10-CM | POA: Diagnosis not present

## 2018-05-18 DIAGNOSIS — Z992 Dependence on renal dialysis: Secondary | ICD-10-CM | POA: Diagnosis not present

## 2018-05-21 DIAGNOSIS — Z23 Encounter for immunization: Secondary | ICD-10-CM | POA: Diagnosis not present

## 2018-05-21 DIAGNOSIS — Z992 Dependence on renal dialysis: Secondary | ICD-10-CM | POA: Diagnosis not present

## 2018-05-21 DIAGNOSIS — N186 End stage renal disease: Secondary | ICD-10-CM | POA: Diagnosis not present

## 2018-05-23 DIAGNOSIS — N186 End stage renal disease: Secondary | ICD-10-CM | POA: Diagnosis not present

## 2018-05-23 DIAGNOSIS — Z23 Encounter for immunization: Secondary | ICD-10-CM | POA: Diagnosis not present

## 2018-05-23 DIAGNOSIS — Z992 Dependence on renal dialysis: Secondary | ICD-10-CM | POA: Diagnosis not present

## 2018-05-25 ENCOUNTER — Other Ambulatory Visit: Payer: Self-pay | Admitting: Family Medicine

## 2018-05-25 DIAGNOSIS — Z992 Dependence on renal dialysis: Secondary | ICD-10-CM | POA: Diagnosis not present

## 2018-05-25 DIAGNOSIS — Z23 Encounter for immunization: Secondary | ICD-10-CM | POA: Diagnosis not present

## 2018-05-25 DIAGNOSIS — N186 End stage renal disease: Secondary | ICD-10-CM | POA: Diagnosis not present

## 2018-05-25 NOTE — Telephone Encounter (Signed)
No samples available at this time. Patient's wife advised.

## 2018-05-25 NOTE — Telephone Encounter (Signed)
Patient's wife is here to see Dr. Caryn Section and is asking for a [place] sample pen for [place].

## 2018-05-28 DIAGNOSIS — Z992 Dependence on renal dialysis: Secondary | ICD-10-CM | POA: Diagnosis not present

## 2018-05-28 DIAGNOSIS — Z23 Encounter for immunization: Secondary | ICD-10-CM | POA: Diagnosis not present

## 2018-05-28 DIAGNOSIS — N186 End stage renal disease: Secondary | ICD-10-CM | POA: Diagnosis not present

## 2018-05-30 DIAGNOSIS — Z992 Dependence on renal dialysis: Secondary | ICD-10-CM | POA: Diagnosis not present

## 2018-05-30 DIAGNOSIS — N186 End stage renal disease: Secondary | ICD-10-CM | POA: Diagnosis not present

## 2018-05-30 DIAGNOSIS — Z23 Encounter for immunization: Secondary | ICD-10-CM | POA: Diagnosis not present

## 2018-06-01 DIAGNOSIS — N186 End stage renal disease: Secondary | ICD-10-CM | POA: Diagnosis not present

## 2018-06-01 DIAGNOSIS — Z992 Dependence on renal dialysis: Secondary | ICD-10-CM | POA: Diagnosis not present

## 2018-06-01 DIAGNOSIS — Z23 Encounter for immunization: Secondary | ICD-10-CM | POA: Diagnosis not present

## 2018-06-04 DIAGNOSIS — Z992 Dependence on renal dialysis: Secondary | ICD-10-CM | POA: Diagnosis not present

## 2018-06-04 DIAGNOSIS — N186 End stage renal disease: Secondary | ICD-10-CM | POA: Diagnosis not present

## 2018-06-04 DIAGNOSIS — Z23 Encounter for immunization: Secondary | ICD-10-CM | POA: Diagnosis not present

## 2018-06-05 ENCOUNTER — Ambulatory Visit: Payer: Self-pay | Admitting: General Surgery

## 2018-06-06 ENCOUNTER — Other Ambulatory Visit: Payer: Self-pay | Admitting: Family Medicine

## 2018-06-06 DIAGNOSIS — Z23 Encounter for immunization: Secondary | ICD-10-CM | POA: Diagnosis not present

## 2018-06-06 DIAGNOSIS — Z992 Dependence on renal dialysis: Secondary | ICD-10-CM | POA: Diagnosis not present

## 2018-06-06 DIAGNOSIS — N186 End stage renal disease: Secondary | ICD-10-CM | POA: Diagnosis not present

## 2018-06-08 DIAGNOSIS — Z23 Encounter for immunization: Secondary | ICD-10-CM | POA: Diagnosis not present

## 2018-06-08 DIAGNOSIS — N186 End stage renal disease: Secondary | ICD-10-CM | POA: Diagnosis not present

## 2018-06-08 DIAGNOSIS — Z992 Dependence on renal dialysis: Secondary | ICD-10-CM | POA: Diagnosis not present

## 2018-06-11 DIAGNOSIS — Z992 Dependence on renal dialysis: Secondary | ICD-10-CM | POA: Diagnosis not present

## 2018-06-11 DIAGNOSIS — N186 End stage renal disease: Secondary | ICD-10-CM | POA: Diagnosis not present

## 2018-06-11 DIAGNOSIS — Z23 Encounter for immunization: Secondary | ICD-10-CM | POA: Diagnosis not present

## 2018-06-13 DIAGNOSIS — Z992 Dependence on renal dialysis: Secondary | ICD-10-CM | POA: Diagnosis not present

## 2018-06-13 DIAGNOSIS — N186 End stage renal disease: Secondary | ICD-10-CM | POA: Diagnosis not present

## 2018-06-13 DIAGNOSIS — Z23 Encounter for immunization: Secondary | ICD-10-CM | POA: Diagnosis not present

## 2018-06-15 DIAGNOSIS — Z992 Dependence on renal dialysis: Secondary | ICD-10-CM | POA: Diagnosis not present

## 2018-06-15 DIAGNOSIS — Z23 Encounter for immunization: Secondary | ICD-10-CM | POA: Diagnosis not present

## 2018-06-15 DIAGNOSIS — N186 End stage renal disease: Secondary | ICD-10-CM | POA: Diagnosis not present

## 2018-06-16 DIAGNOSIS — Z992 Dependence on renal dialysis: Secondary | ICD-10-CM | POA: Diagnosis not present

## 2018-06-16 DIAGNOSIS — N186 End stage renal disease: Secondary | ICD-10-CM | POA: Diagnosis not present

## 2018-06-18 DIAGNOSIS — Z992 Dependence on renal dialysis: Secondary | ICD-10-CM | POA: Diagnosis not present

## 2018-06-18 DIAGNOSIS — N186 End stage renal disease: Secondary | ICD-10-CM | POA: Diagnosis not present

## 2018-06-19 ENCOUNTER — Other Ambulatory Visit: Payer: Self-pay

## 2018-06-19 ENCOUNTER — Ambulatory Visit (INDEPENDENT_AMBULATORY_CARE_PROVIDER_SITE_OTHER): Payer: Medicare Other

## 2018-06-19 ENCOUNTER — Ambulatory Visit (INDEPENDENT_AMBULATORY_CARE_PROVIDER_SITE_OTHER): Payer: Medicare Other | Admitting: Vascular Surgery

## 2018-06-19 VITALS — BP 144/78 | HR 67 | Resp 16 | Ht 66.0 in | Wt 160.4 lb

## 2018-06-19 DIAGNOSIS — N186 End stage renal disease: Secondary | ICD-10-CM

## 2018-06-19 DIAGNOSIS — N184 Chronic kidney disease, stage 4 (severe): Secondary | ICD-10-CM

## 2018-06-19 DIAGNOSIS — Z95820 Peripheral vascular angioplasty status with implants and grafts: Secondary | ICD-10-CM

## 2018-06-19 DIAGNOSIS — E785 Hyperlipidemia, unspecified: Secondary | ICD-10-CM | POA: Diagnosis not present

## 2018-06-19 DIAGNOSIS — N183 Chronic kidney disease, stage 3 unspecified: Secondary | ICD-10-CM

## 2018-06-19 DIAGNOSIS — Z992 Dependence on renal dialysis: Secondary | ICD-10-CM

## 2018-06-19 DIAGNOSIS — Z87891 Personal history of nicotine dependence: Secondary | ICD-10-CM

## 2018-06-19 DIAGNOSIS — I739 Peripheral vascular disease, unspecified: Secondary | ICD-10-CM

## 2018-06-19 DIAGNOSIS — I10 Essential (primary) hypertension: Secondary | ICD-10-CM

## 2018-06-19 DIAGNOSIS — E1122 Type 2 diabetes mellitus with diabetic chronic kidney disease: Secondary | ICD-10-CM | POA: Diagnosis not present

## 2018-06-19 DIAGNOSIS — I120 Hypertensive chronic kidney disease with stage 5 chronic kidney disease or end stage renal disease: Secondary | ICD-10-CM | POA: Diagnosis not present

## 2018-06-19 DIAGNOSIS — Z79899 Other long term (current) drug therapy: Secondary | ICD-10-CM

## 2018-06-19 NOTE — Assessment & Plan Note (Signed)
ABIs today are 0.89 on the right and 0.95 on the left.  Although these may be somewhat falsely elevated due to medial calcification it appears as if the disease is all in the small vessels at this point.  No role for intervention.  Continue current medical regimen.  Recheck in about 6 months.

## 2018-06-19 NOTE — Assessment & Plan Note (Signed)
Fistula is patent on duplex today and has been working much better after the techs have gotten accustomed to sticking it.  Plan to recheck a duplex in about 6 months or sooner if problems develop in the interim.

## 2018-06-19 NOTE — Progress Notes (Signed)
MRN : [id number]  Thomas [name] is a 73 y.o. (06-05-1945) male who presents with chief complaint of  Chief Complaint  Patient presents with  . Follow-up    30monthHDA,ABI  .  History of Present Illness: Patient returns today in follow up of multiple vascular issues.  He is doing quite well.  He seems to be doing better with dialysis.  He reports less tiredness after his treatments.  They are having a much better time using his access.  The text seem to be getting used to how to stick him.  Duplex today shows no hemodynamically significant stenosis within the AV fistula. He is also having no major issues with his legs currently.  He does have some pain but this has been stable.  No new ulceration or infection. ABIs today are 0.89 on the right and 0.95 on the left.  Although these may be somewhat falsely elevated due to medial calcification it appears as if the disease is all in the small vessels at this point.  Current Outpatient Medications  Medication Sig Dispense Refill  . acetaminophen (TYLENOL) 500 MG tablet Take 500 mg by mouth every 8 (eight) hours as needed for mild pain or headache.    .Marland Kitchenaspirin EC 81 MG tablet Take 81 mg by mouth daily.    .Marland Kitchenatorvastatin (LIPITOR) 80 MG tablet Take 1 tablet (80 mg total) by mouth daily at 6 PM. 30 tablet 5  . B Complex-C-Folic Acid (RENA-VITE PO) Take by mouth daily.    . calcium acetate (PHOSLO) 667 MG capsule TAKE 1 CAPSULE BY MOUTH 3 TIMES DAILY WITH MEALS 90 capsule 5  . clopidogrel (PLAVIX) 75 MG tablet TAKE 1 TABLET BY MOUTH ONCE DAILY 30 tablet 2  . glucose blood test strip Contour Ascensia test strips and lancets Use to check blood sugar daily for type 2 diabetes, E11.9. 100 each 4  . insulin degludec (TRESIBA FLEXTOUCH) 100 UNIT/ML SOPN FlexTouch Pen Inject 0.06 mLs (6 Units total) into the skin daily. Increase by 2 units a day if fasting glucose is over 200 3 mL 0  . isosorbide mononitrate (IMDUR) 60 MG 24 hr tablet TAKE 1 TABLET BY  MOUTH ONCE DAILY. 30 tablet 5  . losartan (COZAAR) 25 MG tablet Take 1 tablet (25 mg total) by mouth every evening. 30 tablet 6  . magnesium oxide (MAG-OX) 400 MG tablet Take 400 mg by mouth daily.    . metoprolol succinate (TOPROL-XL) 50 MG 24 hr tablet Take 50 mg by mouth daily. Take with or immediately following a meal.    . pantoprazole (PROTONIX) 40 MG tablet Take 1 tablet (40 mg total) by mouth daily. 30 tablet 12  . sodium bicarbonate 650 MG tablet Take 650 mg by mouth 2 (two) times daily.    . tamsulosin (FLOMAX) 0.4 MG CAPS capsule TAKE 1 CAPSULE BY MOUTH ONCE DAILY AFTERSUPPER 30 capsule 5  . torsemide (DEMADEX) 100 MG tablet Take 100 mg by mouth daily. Do not take on dialysis days    . traMADol (ULTRAM) 50 MG tablet Take 1 tablet (50 mg total) by mouth at bedtime as needed. 30 tablet 0  . ALPRAZolam (XANAX) 0.25 MG tablet Take one daily and one at bedtime as needed (Patient not taking: Reported on 06/19/2018) 1 tablet 3  . Blood Glucose Monitoring Suppl (ONE TOUCH ULTRA 2) w/Device KIT Use to check gluocose daily for type 2 diabetes (Patient not taking: Reported on 06/19/2018) 1 each 0  .  cephALEXin (KEFLEX) 500 MG capsule Take 1 capsule (500 mg total) by mouth 3 (three) times daily. (Patient not taking: Reported on 06/19/2018) 21 capsule 0  . LORazepam (ATIVAN) 0.5 MG tablet Take 1 tablet (0.5 mg total) by mouth at bedtime as needed for sleep. (Patient not taking: Reported on 06/19/2018) 30 tablet 3  . polycarbophil (FIBERCON) 625 MG tablet Take 625 mg by mouth as needed.    . predniSONE (DELTASONE) 5 MG tablet Take 1 tablet (5 mg total) by mouth daily with breakfast. Taper down by one tablet daily starting at 6 by mouth the first day (6,5,4,3,2,1) (Patient not taking: Reported on 06/19/2018) 21 tablet 0  . traZODone (DESYREL) 50 MG tablet TAKE 1/2 TO 1 TABLET BY MOUTH AT BEDTIMEAS NEEDED SLEEP (Patient not taking: Reported on 06/19/2018) 30 tablet 5   No current facility-administered medications  for this visit.     Past Medical History:  Diagnosis Date  . Allergy   . Arthritis   . CAD (coronary artery disease)   . Chronic kidney disease   . Diabetes mellitus without complication ([place])   . Dyspnea   . Erosive esophagitis   . ESRD (end stage renal disease) on dialysis ([place]) 2019   MWF, Left arm fistula  . GERD (gastroesophageal reflux disease)   . Gouty arthropathy   . History of colonic polyps   . History of kidney stones   . History of MRSA infection   . Hyperkalemia   . Hyperlipidemia   . Hypertension   . Melanoma ([place])   . Myocardial infarction ([place]) 1986, 2019  . Peripheral vascular disease ([place])   . 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 Trigger finger of left hand   . Ureteral tumor     Past Surgical History:  Procedure Laterality Date  . A/V SHUNT INTERVENTION Left 03/12/2018   Procedure: A/V SHUNT INTERVENTION;  Surgeon: Algernon Huxley, MD;  Location: [facility];  Service: Cardiovascular;  Laterality: Left;  . AV FISTULA PLACEMENT Left 12/07/2017   Procedure: ARTERIOVENOUS (AV) FISTULA CREATION ( BRACHIOBASILIC STAGE );  Surgeon: Algernon Huxley, MD;  Location: [facility];  Service: Vascular;  Laterality: Left;  . CATARACT EXTRACTION    . CORONARY ARTERY BYPASS GRAFT  1996  . CORONARY STENT INTERVENTION N/A 05/29/2017   Procedure: CORONARY STENT INTERVENTION;  Surgeon: Isaias Cowman, MD;  Location: [facility];  Service: Cardiovascular;  Laterality: N/A;  . DIALYSIS/PERMA CATHETER REMOVAL N/A 04/12/2018   Procedure: DIALYSIS/PERMA CATHETER REMOVAL;  Surgeon: Algernon Huxley, MD;  Location: [facility];  Service: Cardiovascular;  Laterality: N/A;  . EXCISION Ureteral Tumor  2010?   (benign) Dr. Quillian Quince  . EYE SURGERY Bilateral    Cataract Extraction with IOL  . LEFT HEART CATH AND CORONARY ANGIOGRAPHY N/A 10/18/2016   Procedure: Left Heart Cath and Coronary Angiography;  Surgeon: Isaias Cowman, MD;  Location:  [facility];  Service: Cardiovascular;  Laterality: N/A;  . LEFT HEART CATH AND CORONARY ANGIOGRAPHY N/A 05/29/2017   Procedure: LEFT HEART CATH AND CORONARY ANGIOGRAPHY;  Surgeon: Isaias Cowman, MD;  Location: [facility];  Service: Cardiovascular;  Laterality: N/A;  . LOWER EXTREMITY ANGIOGRAPHY Right 10/31/2016   Procedure: Lower Extremity Angiography;  Surgeon: Algernon Huxley, MD;  Location: [facility];  Service: Cardiovascular;  Laterality: Right;  . LOWER EXTREMITY ANGIOGRAPHY Left 11/14/2016   Procedure: Lower Extremity Angiography;  Surgeon: Lucky Cowboy,  Erskine Squibb, MD;  Location: [facility];  Service: Cardiovascular;  Laterality: Left;  . LOWER EXTREMITY INTERVENTION  11/14/2016   Procedure: Lower Extremity Intervention;  Surgeon: Algernon Huxley, MD;  Location: [facility];  Service: Cardiovascular;;  . open heart surgery    . RETINAL DETACHMENT SURGERY Right November 2107    Social History Social History   Tobacco Use  . Smoking status: Former Smoker    Packs/day: 1.00    Years: 51.00    Pack years: 51.00    Types: Cigarettes    Last attempt to quit: 06/21/2017    Years since quitting: 0.9  . Smokeless tobacco: Never Used  . Tobacco comment: started smoking at age [age]. Smoked for 50 years  Substance Use Topics  . Alcohol use: Not Currently    Alcohol/week: 0.0 standard drinks    Frequency: Never    Comment: 1 every 6 months  . Drug use: No     Family History Family History  Problem Relation Age of Onset  . Alzheimer's disease Mother   . Alzheimer's disease Brother   . Lung cancer Paternal Grandfather      Allergies  Allergen Reactions  . Ciprofloxacin Itching and Swelling    Facial swelling   . Hydralazine Nausea And Vomiting  . Trulicity [Dulaglutide]     Bloating   REVIEW OF SYSTEMS(Negative unless checked)  Constitutional: [] ?Weight loss[] ?Fever[] ?Chills Cardiac:[] ?Chest pain[] ?Chest  pressure[] ?Palpitations [] ?Shortness of breath when laying flat [] ?Shortness of breath at rest [] ?Shortness of breath with exertion. Vascular: [x] ?Pain in legs with walking[] ?Pain in legsat rest[] ?Pain in legs when laying flat [x] ?Claudication [] ?Pain in feet when walking [] ?Pain in feet at rest [] ?Pain in feet when laying flat [] ?History of DVT [] ?Phlebitis [x] ?Swelling in legs [] ?Varicose veins [] ?Non-healing ulcers Pulmonary: [] ?Uses home oxygen [] ?Productive cough[] ?Hemoptysis [] ?Wheeze [] ?COPD [] ?Asthma Neurologic: [] ?Dizziness [] ?Blackouts [] ?Seizures [] ?History of stroke [] ?History of TIA[] ?Aphasia [] ?Temporary blindness[] ?Dysphagia [] ?Weaknessor numbness in arms [] ?Weakness or numbnessin legs Musculoskeletal: [x] ?Arthritis [] ?Joint swelling [x] ?Joint pain [] ?Low back pain Hematologic:[] ?Easy bruising[] ?Easy bleeding [] ?Hypercoagulable state [] ?Anemic  Gastrointestinal:[] ?Blood in stool[] ?Vomiting blood[] ?Gastroesophageal reflux/heartburn[] ?Abdominal pain Genitourinary: [x] ?Chronic kidney disease [] ?Difficulturination [] ?Frequenturination [] ?Burning with urination[] ?Hematuria Skin: [] ?Rashes [] ?Ulcers [] ?Wounds Psychological: [] ?History of anxiety[] ?History of major depression.    Physical Examination  BP (!) 144/78 (BP Location: Right Arm)   Pulse 67   Resp 16   Ht 5' 6"  (1.676 m)   Wt 160 lb 6.4 oz (72.8 kg)   BMI 25.89 kg/m  Gen:  WD/WN, NAD Head: [place]/AT, No temporalis wasting. Ear/Nose/Throat: Hearing grossly intact, nares w/o erythema or drainage Eyes: Conjunctiva clear. Sclera non-icteric Neck: Supple.  Trachea midline Pulmonary:  Good air movement, no use of accessory muscles.  Cardiac: Irregular Vascular: Good thrill in left brachiocephalic AV fistula Vessel Right Left  Radial Palpable Palpable                          PT  not palpable  1+ palpable  DP   1+ palpable  not palpable    Musculoskeletal: M/S 5/5 throughout.  No deformity or atrophy.  Trace lower extremity edema. Neurologic: Sensation grossly intact in extremities.  Symmetrical.  Speech is fluent.  Psychiatric: Judgment intact, Mood & affect appropriate for pt's clinical situation. Dermatologic: No rashes or ulcers noted.  No cellulitis or open wounds.       Labs Recent Results (from the past 2160 hour(s))  POCT glycosylated hemoglobin (Hb A1C)     Status: Abnormal   Collection Time:  04/26/18  8:48 AM  Result Value Ref Range   Hemoglobin A1C 7.3 (A) 4.0 - 5.6 %    Comment: Average 163  POCT UA - Microalbumin     Status: Abnormal   Collection Time: 04/26/18  8:48 AM  Result Value Ref Range   Microalbumin Ur, POC 100 mg/L  CBC with Differential/Platelet     Status: Abnormal   Collection Time: 05/15/18  8:33 AM  Result Value Ref Range   WBC 9.5 3.4 - 10.8 x10E3/uL   RBC 4.11 (L) 4.14 - 5.80 x10E6/uL   Hemoglobin 11.7 (L) 13.0 - 17.7 g/dL   Hematocrit 36.7 (L) 37.5 - 51.0 %   MCV 89 79 - 97 fL   MCH 28.5 26.6 - 33.0 pg   MCHC 31.9 31.5 - 35.7 g/dL   RDW 17.2 (H) 11.6 - 15.4 %   Platelets 328 150 - 450 x10E3/uL   Neutrophils 76 Not Estab. %   Lymphs 13 Not Estab. %   Monocytes 11 Not Estab. %   Eos 0 Not Estab. %   Basos 0 Not Estab. %   Neutrophils Absolute 7.1 (H) 1.4 - 7.0 x10E3/uL   Lymphocytes Absolute 1.2 0.7 - 3.1 x10E3/uL   Monocytes Absolute 1.1 (H) 0.1 - 0.9 x10E3/uL   EOS (ABSOLUTE) 0.0 0.0 - 0.4 x10E3/uL   Basophils Absolute 0.0 0.0 - 0.2 x10E3/uL   Immature Granulocytes 0 Not Estab. %   Immature Grans (Abs) 0.0 0.0 - 0.1 x10E3/uL  Uric acid     Status: None   Collection Time: 05/15/18  8:33 AM  Result Value Ref Range   Uric Acid 4.3 3.7 - 8.6 mg/dL    Comment:            Therapeutic target for gout patients: <6.0    Radiology No results found.  Assessment/Plan Essential (primary) hypertension blood pressure control important in reducing  the progression of atherosclerotic disease. On appropriate oral medications.   Diabetes blood glucose control important in reducing the progression of atherosclerotic disease. Also, involved in wound healing. On appropriate medications.   Hyperlipidemia lipid control important in reducing the progression of atherosclerotic disease. Continue statin therapy  ESRD on dialysis ([place]) Fistula is patent on duplex today and has been working much better after the techs have gotten accustomed to sticking it.  Plan to recheck a duplex in about 6 months or sooner if problems develop in the interim.  PAD (peripheral artery disease) ([facility]) ABIs today are 0.89 on the right and 0.95 on the left.  Although these may be somewhat falsely elevated due to medial calcification it appears as if the disease is all in the small vessels at this point.  No role for intervention.  Continue current medical regimen.  Recheck in about 6 months.    Leotis Pain, MD  06/19/2018 4:14 PM    This note was created with Dragon medical transcription system.  Any errors from dictation are purely unintentional

## 2018-06-20 DIAGNOSIS — N186 End stage renal disease: Secondary | ICD-10-CM | POA: Diagnosis not present

## 2018-06-20 DIAGNOSIS — Z992 Dependence on renal dialysis: Secondary | ICD-10-CM | POA: Diagnosis not present

## 2018-06-21 ENCOUNTER — Encounter: Payer: Self-pay | Admitting: *Deleted

## 2018-06-22 ENCOUNTER — Ambulatory Visit (INDEPENDENT_AMBULATORY_CARE_PROVIDER_SITE_OTHER): Payer: Medicare Other

## 2018-06-22 VITALS — BP 138/62 | HR 85 | Temp 98.2°F | Ht 66.0 in | Wt 159.6 lb

## 2018-06-22 DIAGNOSIS — Z0000 Encounter for general adult medical examination without abnormal findings: Secondary | ICD-10-CM | POA: Diagnosis not present

## 2018-06-22 DIAGNOSIS — Z992 Dependence on renal dialysis: Secondary | ICD-10-CM | POA: Diagnosis not present

## 2018-06-22 DIAGNOSIS — N186 End stage renal disease: Secondary | ICD-10-CM | POA: Diagnosis not present

## 2018-06-22 NOTE — Progress Notes (Signed)
Subjective:   Thomas [name] is a 73 y.o. male who presents for Medicare Annual/Subsequent preventive examination.  Review of Systems:  N/A  Cardiac Risk Factors include: advanced age (>38mn, >>23women);dyslipidemia;hypertension;male gender     Objective:    Vitals: BP 138/62 (BP Location: Right Arm)   Pulse 85   Temp 98.2 F (36.8 C) (Oral)   Ht _0  (1.676 m)   Wt 159 lb 9.6 oz (72.4 kg)   BMI 25.76 kg/m   Body mass index is 25.76 kg/m.  Advanced Directives 06/22/2018 03/17/2018 03/09/2018 03/08/2018 02/24/2018 02/24/2018 02/21/2018  Does Patient Have a Medical Advance Directive? _1  No No  Would patient like information on creating a medical advance directive? No - Patient declined No - Patient declined No - Patient declined - No - Patient declined No - Patient declined No - Patient declined    Tobacco Social History   Tobacco Use  Smoking Status Former Smoker  . Packs/day: 1.00  . Years: 51.00  . Pack years: 51.00  . Types: Cigarettes  . Last attempt to quit: 06/21/2017  . Years since quitting: 1.0  Smokeless Tobacco Never Used  Tobacco Comment   started smoking at age [age] Smoked for 50 years     Counseling given: Not Answered Comment: started smoking at age [age] Smoked for 50 years   Clinical Intake:  Pre-visit preparation completed: Yes  Pain : No/denies pain Pain Score: 0-No pain    Diabetes:  Is the patient diabetic?  Yes type 2 If diabetic, was a CBG obtained today?  No  Did the patient bring in their glucometer from home?  No  How often do you monitor your CBG's? Once to twice a day.   Financial Strains and Diabetes Management:  Are you having any financial strains with the device, your supplies or your medication? No .  Does the patient want to be seen by Chronic Care Management for management of their diabetes?  No  Would the patient like to be referred to a Nutritionist or for Diabetic Management?  No   Diabetic  Exams:  Diabetic Eye Exam: Completed 04/2018 per pt (with Dr WEllin Mayhew. Requested records to be faxed to clinic.   Diabetic Foot Exam: Completed 09/30/16. Pt has been advised about the importance in completing this exam. Note made to f/u on this at next OV with PCP.  Nutritional Status: BMI 25 -29 Overweight Nutritional Risks: None   How often do you need to have someone help you when you read instructions, pamphlets, or other written materials from your doctor or pharmacy?: 1 - Never  Interpreter Needed?: No  Information entered by :: [facility] L[facility]  Past Medical History:  Diagnosis Date  . Allergy   . Arthritis   . CAD (coronary artery disease)   . Chronic kidney disease   . Diabetes mellitus without complication ([place]   . Dyspnea   . Erosive esophagitis   . [place] stage renal disease[place]   MWF, Left arm fistula  . GERD (gastroesophageal reflux disease)   . Gouty arthropathy   . History of colonic polyps   . History of kidney stones   . History of MRSA infection   . Hyperkalemia   . Hyperlipidemia   . Hypertension   . Melanoma ([facility]   . Myocardial infarction ([facility] 1986, 2019  . Peripheral vascular disease ([facility] [place]   . 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 Trigger finger of left hand   . Ureteral tumor    Past Surgical History:  Procedure Laterality Date  . A/V SHUNT INTERVENTION Left 03/12/2018   Procedure: A/V SHUNT INTERVENTION;  Surgeon: Algernon Huxley, MD;  Location: [place] [facility];  Service: Cardiovascular;  Laterality: Left;  . AV FISTULA PLACEMENT Left 12/07/2017   Procedure: ARTERIOVENOUS (AV) FISTULA CREATION ( BRACHIOBASILIC STAGE );  Surgeon: Algernon Huxley, MD;  Location: [facility];  Service: Vascular;  Laterality: Left;  . CATARACT EXTRACTION    . CORONARY ARTERY BYPASS GRAFT  1996  . CORONARY STENT INTERVENTION N/A 05/29/2017   Procedure: CORONARY STENT INTERVENTION;  Surgeon: Isaias Cowman, MD;   Location: [place] [facility];  Service: Cardiovascular;  Laterality: N/A;  . DIALYSIS/PERMA CATHETER REMOVAL N/A 04/12/2018   Procedure: DIALYSIS/PERMA CATHETER REMOVAL;  Surgeon: Algernon Huxley, MD;  Location: [facility];  Service: Cardiovascular;  Laterality: N/A;  . EXCISION Ureteral Tumor  2010?   (benign) Dr. Quillian Quince  . EYE SURGERY Bilateral    Cataract Extraction with IOL  . LEFT HEART CATH AND CORONARY ANGIOGRAPHY N/A 10/18/2016   Procedure: Left Heart Cath and Coronary Angiography;  Surgeon: Isaias Cowman, MD;  Location: [place] [facility];  Service: Cardiovascular;  Laterality: N/A;  . LEFT HEART CATH AND CORONARY ANGIOGRAPHY N/A 05/29/2017   Procedure: LEFT HEART CATH AND CORONARY ANGIOGRAPHY;  Surgeon: Isaias Cowman, MD;  Location: [place] [facility];  Service: Cardiovascular;  Laterality: N/A;  . LOWER EXTREMITY ANGIOGRAPHY Right 10/31/2016   Procedure: Lower Extremity Angiography;  Surgeon: Algernon Huxley, MD;  Location: [place] [facility];  Service: Cardiovascular;  Laterality: Right;  . LOWER EXTREMITY ANGIOGRAPHY Left 11/14/2016   Procedure: Lower Extremity Angiography;  Surgeon: Algernon Huxley, MD;  Location: [place] [facility];  Service: Cardiovascular;  Laterality: Left;  . LOWER EXTREMITY INTERVENTION  11/14/2016   Procedure: Lower Extremity Intervention;  Surgeon: Algernon Huxley, MD;  Location: [place] [facility];  Service: Cardiovascular;;  . open heart surgery    . RETINAL DETACHMENT SURGERY Right November 2107   Family History  Problem Relation Age of Onset  . Alzheimer's disease Mother   . Alzheimer's disease Brother   . Lung cancer Paternal Grandfather    Social History   Socioeconomic History  . Marital status: Married    Spouse name: Not on file  . Number of children: 2  . Years of education: Not on file  . Highest education level: Some college, no degree  Occupational History  . Occupation: Retired  Scientific laboratory technician  . Financial  resource strain: Not hard at all  . Food insecurity:    Worry: Never true    Inability: Never true  . Transportation needs:    Medical: No    Non-medical: No  Tobacco Use  . Smoking status: Former Smoker    Packs/day: 1.00    Years: 51.00    Pack years: 51.00    Types: Cigarettes    Last attempt to quit: 06/21/2017    Years since quitting: 1.0  . Smokeless tobacco: Never Used  . Tobacco comment: started smoking at age [age]. Smoked for 50 years  Substance and Sexual Activity  . Alcohol use: Not Currently    Alcohol/week: 0.0 standard drinks    Frequency: Never  . Drug use: No  . Sexual activity: Not on file  Lifestyle  . Physical activity:    Days per week: 0  days    Minutes per session: 0 min  . Stress: Only a little  Relationships  . Social connections:    Talks on phone: Patient refused    Gets together: Patient refused    Attends religious service: Patient refused    Active member of club or organization: Patient refused    Attends meetings of clubs or organizations: Patient refused    Relationship status: Patient refused  Other Topics Concern  . Not on file  Social History Narrative  . Not on file    Outpatient Encounter Medications as of 06/22/2018  Medication Sig  . acetaminophen (TYLENOL) 500 MG tablet Take 500 mg by mouth every 8 (eight) hours as needed for mild pain or headache.  Marland Kitchen aspirin EC 81 MG tablet Take 81 mg by mouth daily.  Marland Kitchen atorvastatin (LIPITOR) 80 MG tablet Take 1 tablet (80 mg total) by mouth daily at 6 PM.  . B Complex-C-Folic Acid (RENA-VITE PO) Take by mouth daily.  . calcium acetate (PHOSLO) 667 MG capsule TAKE 1 CAPSULE BY MOUTH 3 TIMES DAILY WITH MEALS  . clopidogrel (PLAVIX) 75 MG tablet TAKE 1 TABLET BY MOUTH ONCE DAILY  . glucose blood test strip Contour Ascensia test strips and lancets Use to check blood sugar daily for type 2 diabetes, E11.9.  . insulin degludec (TRESIBA FLEXTOUCH) 100 UNIT/ML SOPN FlexTouch Pen Inject 0.06 mLs (6  Units total) into the skin daily. Increase by 2 units a day if fasting glucose is over 200  . isosorbide mononitrate (IMDUR) 60 MG 24 hr tablet TAKE 1 TABLET BY MOUTH ONCE DAILY.  Marland Kitchen losartan (COZAAR) 25 MG tablet Take 1 tablet (25 mg total) by mouth every evening.  . magnesium oxide (MAG-OX) 400 MG tablet Take 400 mg by mouth daily.  . metoprolol succinate (TOPROL-XL) 50 MG 24 hr tablet Take 50 mg by mouth daily. Take with or immediately following a meal.  . pantoprazole (PROTONIX) 40 MG tablet Take 1 tablet (40 mg total) by mouth daily.  . polycarbophil (FIBERCON) 625 MG tablet Take 625 mg by mouth as needed.  . sodium bicarbonate 650 MG tablet Take 650 mg by mouth 2 (two) times daily.  . tamsulosin (FLOMAX) 0.4 MG CAPS capsule TAKE 1 CAPSULE BY MOUTH ONCE DAILY AFTERSUPPER  . torsemide (DEMADEX) 100 MG tablet Take 100 mg by mouth daily. Do not take on dialysis days  . traMADol (ULTRAM) 50 MG tablet Take 1 tablet (50 mg total) by mouth at bedtime as needed.  . ALPRAZolam (XANAX) 0.25 MG tablet Take one daily and one at bedtime as needed (Patient not taking: Reported on 06/19/2018)  . Blood Glucose Monitoring Suppl (ONE TOUCH ULTRA 2) w/Device KIT Use to check gluocose daily for type 2 diabetes (Patient not taking: Reported on 06/19/2018)  . cephALEXin (KEFLEX) 500 MG capsule Take 1 capsule (500 mg total) by mouth 3 (three) times daily. (Patient not taking: Reported on 06/19/2018)  . LORazepam (ATIVAN) 0.5 MG tablet Take 1 tablet (0.5 mg total) by mouth at bedtime as needed for sleep. (Patient not taking: Reported on 06/19/2018)  . predniSONE (DELTASONE) 5 MG tablet Take 1 tablet (5 mg total) by mouth daily with breakfast. Taper down by one tablet daily starting at 6 by mouth the first day (6,5,4,3,2,1) (Patient not taking: Reported on 06/19/2018)  . traZODone (DESYREL) 50 MG tablet TAKE 1/2 TO 1 TABLET BY MOUTH AT BEDTIMEAS NEEDED SLEEP (Patient not taking: Reported on 06/19/2018)   No facility-administered  encounter medications  on file as of 06/22/2018.     Activities of Daily Living In your present state of health, do you have any difficulty performing the following activities: 06/22/2018 03/17/2018  Hearing? N N  Comment - -  Vision? N N  Comment Wears eye glasses daily.  -  Difficulty concentrating or making decisions? N N  Walking or climbing stairs? Y N  Comment Due to right knee pain.  -  Dressing or bathing? N N  Doing errands, shopping? Y N  Comment Not driving currently.  -  Preparing Food and eating ? N -  Using the Toilet? N -  In the past six months, have you accidently leaked urine? N -  Do you have problems with loss of bowel control? N -  Managing your Medications? Y -  Comment Daughter manages medications. -  Managing your Finances? N -  Housekeeping or managing your Housekeeping? N -  Some recent data might be hidden    Patient Care Team: Birdie Sons, MD as PCP - General (Family Medicine) Isaias Cowman, MD as Consulting Physician (Cardiology) Jannet Mantis, MD as Consulting Physician (Dermatology) Anell Barr, OD as Consulting Physician (Optometry) Murlean Iba, MD as Consulting Physician (Internal Medicine) Lucky Cowboy Erskine Squibb, MD as Referring Physician (Vascular Surgery)   Assessment:   This is a routine wellness examination for Drequan.  Exercise Activities and Dietary recommendations Current Exercise Habits: The patient does not participate in regular exercise at present, Exercise limited by: Other - see comments(Due to weakness from dialysis.)  Goals    . DIET - REDUCE SODIUM INTAKE     Recommend current diet regimen of eating low sodium heart healthy foods.     . Exercise      Starting in spring 2018, I will start walking daily for a quarter mile and work up to 1 mile daily.       Fall Risk Fall Risk  06/22/2018 04/26/2018 06/29/2017 06/20/2017 06/07/2017  Falls in the past year? 0 0 No No No  Follow up - Falls evaluation completed - - -    FALL RISK PREVENTION PERTAINING TO THE HOME: Any stairs in or around the home? Yes  If so, do they handrails? Yes   Home free of loose throw rugs in walkways, pet beds, electrical cords, etc? Yes  Adequate lighting in your home to reduce risk of falls? Yes   ASSISTIVE DEVICES UTILIZED TO PREVENT FALLS:  Life alert? No  Use of a cane, walker or w/c? No  Grab bars in the bathroom? Yes  Shower chair or bench in shower? No  Elevated toilet seat or a handicapped toilet? No    TIMED UP AND GO:  Was the test performed? No .    Depression Screen PHQ 2/9 Scores 06/22/2018 07/24/2017 06/29/2017 06/20/2017  PHQ - 2 Score 0 0 0 0  PHQ- 9 Score - 3 9 0    Cognitive Function: Declined today.      6CIT Screen 06/03/2016  What Year? 0 points  What month? 0 points  What time? 0 points  Count back from 20 0 points  Months in reverse 0 points  Repeat phrase 6 points  Total Score 6    Immunization History  Administered Date(s) Administered  . Influenza Split 03/01/2010  . Influenza, High Dose Seasonal PF 01/31/2017, 01/08/2018  . Influenza,inj,Quad PF,6+ Mos 01/01/2013  . Influenza-Unspecified 01/16/2014, 01/06/2018  . Pneumococcal Conjugate-13 11/20/2013  . Pneumococcal Polysaccharide-23 02/24/2004, 08/26/2009  . Td 04/18/2001  Qualifies for Shingles Vaccine? Yes . Due for Shingrix. Education has been provided regarding the importance of this vaccine. Pt has been advised to call insurance company to determine out of pocket expense. Advised may also receive vaccine at local pharmacy or Health Dept. Verbalized acceptance and understanding.  Tdap: Although this vaccine is not a covered service during a Wellness Exam, does the patient still wish to receive this vaccine today?  No .  Education has been provided regarding the importance of this vaccine. Advised may receive this vaccine at local pharmacy or Health Dept. Aware to provide a copy of the vaccination record if obtained from  local pharmacy or Health Dept. Verbalized acceptance and understanding.  Flu Vaccine: Up to date  Pneumococcal Vaccine: Up to date  Screening Tests Health Maintenance  Topic Date Due  . FOOT EXAM  09/30/2017  . OPHTHALMOLOGY EXAM  01/20/2018  . TETANUS/TDAP  04/18/2026 (Originally 04/19/2011)  . HEMOGLOBIN A1C  10/25/2018  . COLONOSCOPY  03/04/2019  . INFLUENZA VACCINE  Completed  . Hepatitis C Screening  Completed  . PNA vac Low Risk Adult  Completed   Cancer Screenings:  Colorectal Screening: Completed 03/03/14. Repeat every 5 years.  Lung Cancer Screening: (Low Dose CT Chest recommended if Age 12-80 years, 30 pack-year currently smoking OR have quit w/in 15years.) does qualify. An Epic message has been sent to Burgess Estelle, RN (Oncology Nurse Navigator) regarding the possible need for this exam. Raquel Sarna will review the patient's chart to determine if the patient truly qualifies for the exam. If the patient qualifies, Raquel Sarna will order the Low Dose CT of the chest to facilitate the scheduling of this exam.   Additional Screening:  Hepatitis C Screening: Up to date  Vision Screening: Recommended annual ophthalmology exams for early detection of glaucoma and other disorders of the eye.  Dental Screening: Recommended annual dental exams for proper oral hygiene  Community Resource Referral:  CRR required this visit?  No        Plan:  I have personally reviewed and addressed the Medicare Annual Wellness questionnaire and have noted the following in the patient's chart:  A. Medical and social history B. Use of alcohol, tobacco or illicit drugs  C. Current medications and supplements D. Functional ability and status E.  Nutritional status F.  Physical activity G. Advance directives H. List of other physicians I.  Hospitalizations, surgeries, and ER visits in previous 12 months J.  Gateway such as hearing and vision if needed, cognitive and  depression L. Referrals and appointments - none  In addition, I have reviewed and discussed with patient certain preventive protocols, quality metrics, and best practice recommendations. A written personalized care plan for preventive services as well as general preventive health recommendations were provided to patient.  See attached scanned questionnaire for additional information.   Signed,  Fabio Neighbors, LPN Nurse Health Advisor   Nurse Recommendations: Pt needs a diabetic foot exam and Hgb A1c checked at [facility] on 08/09/18. Pt declined the tetanus vaccine today. Requested records of previous eye exam.

## 2018-06-22 NOTE — Patient Instructions (Signed)
[name] , Thank you for taking time to come for your Medicare Wellness Visit. I appreciate your ongoing commitment to your health goals. Please review the following plan we discussed and let me know if I can assist you in the future.   Screening recommendations/referrals: Colonoscopy: Up to date, due 02/2019 Recommended yearly ophthalmology/optometry visit for glaucoma screening and checkup Recommended yearly dental visit for hygiene and checkup  Vaccinations: Influenza vaccine: Up to date Pneumococcal vaccine: Completed series Tdap vaccine: Pt declines today.  Shingles vaccine: Pt declines today.     Advanced directives: Advance directive discussed with you today. Even though you declined this today please call our office should you change your mind and we can give you the proper paperwork for you to fill out.  Conditions/risks identified: Continue current diet plan of avoiding salts and eating a heart healthy diet. Also continue to walk daily as tolerated.   Next appointment: 08/09/18 with Dr Caryn Section.   Preventive Care 63 Years and Older, Male Preventive care refers to lifestyle choices and visits with your health care provider that can promote health and wellness. What does preventive care include?  A yearly physical exam. This is also called an annual well check.  Dental exams once or twice a year.  Routine eye exams. Ask your health care provider how often you should have your eyes checked.  Personal lifestyle choices, including:  Daily care of your teeth and gums.  Regular physical activity.  Eating a healthy diet.  Avoiding tobacco and drug use.  Limiting alcohol use.  Practicing safe sex.  Taking low doses of aspirin every day.  Taking vitamin and mineral supplements as recommended by your health care provider. What happens during an annual well check? The services and screenings done by your health care provider during your annual well check will depend on  your age, overall health, lifestyle risk factors, and family history of disease. Counseling  Your health care provider may ask you questions about your:  Alcohol use.  Tobacco use.  Drug use.  Emotional well-being.  Home and relationship well-being.  Sexual activity.  Eating habits.  History of falls.  Memory and ability to understand (cognition).  Work and work Statistician. Screening  You may have the following tests or measurements:  Height, weight, and BMI.  Blood pressure.  Lipid and cholesterol levels. These may be checked every 5 years, or more frequently if you are over 48 years old.  Skin check.  Lung cancer screening. You may have this screening every year starting at age 42 if you have a 30-pack-year history of smoking and currently smoke or have quit within the past 15 years.  Fecal occult blood test (FOBT) of the stool. You may have this test every year starting at age 45.  Flexible sigmoidoscopy or colonoscopy. You may have a sigmoidoscopy every 5 years or a colonoscopy every 10 years starting at age 30.  Prostate cancer screening. Recommendations will vary depending on your family history and other risks.  Hepatitis C blood test.  Hepatitis B blood test.  Sexually transmitted disease (STD) testing.  Diabetes screening. This is done by checking your blood sugar (glucose) after you have not eaten for a while (fasting). You may have this done every 1-3 years.  Abdominal aortic aneurysm (AAA) screening. You may need this if you are a current or former smoker.  Osteoporosis. You may be screened starting at age 4 if you are at high risk. Talk with your health care provider  about your test results, treatment options, and if necessary, the need for more tests. Vaccines  Your health care provider may recommend certain vaccines, such as:  Influenza vaccine. This is recommended every year.  Tetanus, diphtheria, and acellular pertussis (Tdap, Td) vaccine.  You may need a Td booster every 10 years.  Zoster vaccine. You may need this after age 86.  Pneumococcal 13-valent conjugate (PCV13) vaccine. One dose is recommended after age 42.  Pneumococcal polysaccharide (PPSV23) vaccine. One dose is recommended after age 82. Talk to your health care provider about which screenings and vaccines you need and how often you need them. This information is not intended to replace advice given to you by your health care provider. Make sure you discuss any questions you have with your health care provider. Document Released: 05/01/2015 Document Revised: 12/23/2015 Document Reviewed: 02/03/2015 Elsevier Interactive Patient Education  2017 [place] Prevention in the Home Falls can cause injuries. They can happen to people of all ages. There are many things you can do to make your home safe and to help prevent falls. What can I do on the outside of my home?  Regularly fix the edges of walkways and driveways and fix any cracks.  Remove anything that might make you trip as you walk through a door, such as a raised step or threshold.  Trim any bushes or trees on the path to your home.  Use bright outdoor lighting.  Clear any walking paths of anything that might make someone trip, such as rocks or tools.  Regularly check to see if handrails are loose or broken. Make sure that both sides of any steps have handrails.  Any raised decks and porches should have guardrails on the edges.  Have any leaves, snow, or ice cleared regularly.  Use sand or salt on walking paths during winter.  Clean up any spills in your garage right away. This includes oil or grease spills. What can I do in the bathroom?  Use night lights.  Install grab bars by the toilet and in the tub and shower. Do not use towel bars as grab bars.  Use non-skid mats or decals in the tub or shower.  If you need to sit down in the shower, use a plastic, non-slip stool.  Keep the  floor dry. Clean up any water that spills on the floor as soon as it happens.  Remove soap buildup in the tub or shower regularly.  Attach bath mats securely with double-sided non-slip rug tape.  Do not have throw rugs and other things on the floor that can make you trip. What can I do in the bedroom?  Use night lights.  Make sure that you have a light by your bed that is easy to reach.  Do not use any sheets or blankets that are too big for your bed. They should not hang down onto the floor.  Have a firm chair that has side arms. You can use this for support while you get dressed.  Do not have throw rugs and other things on the floor that can make you trip. What can I do in the kitchen?  Clean up any spills right away.  Avoid walking on wet floors.  Keep items that you use a lot in easy-to-reach places.  If you need to reach something above you, use a strong step stool that has a grab bar.  Keep electrical cords out of the way.  Do not use floor polish or  wax that makes floors slippery. If you must use wax, use non-skid floor wax.  Do not have throw rugs and other things on the floor that can make you trip. What can I do with my stairs?  Do not leave any items on the stairs.  Make sure that there are handrails on both sides of the stairs and use them. Fix handrails that are broken or loose. Make sure that handrails are as long as the stairways.  Check any carpeting to make sure that it is firmly attached to the stairs. Fix any carpet that is loose or worn.  Avoid having throw rugs at the top or bottom of the stairs. If you do have throw rugs, attach them to the floor with carpet tape.  Make sure that you have a light switch at the top of the stairs and the bottom of the stairs. If you do not have them, ask someone to add them for you. What else can I do to help prevent falls?  Wear shoes that:  Do not have high heels.  Have rubber bottoms.  Are comfortable and fit  you well.  Are closed at the toe. Do not wear sandals.  If you use a stepladder:  Make sure that it is fully opened. Do not climb a closed stepladder.  Make sure that both sides of the stepladder are locked into place.  Ask someone to hold it for you, if possible.  Clearly mark and make sure that you can see:  Any grab bars or handrails.  First and last steps.  Where the edge of each step is.  Use tools that help you move around (mobility aids) if they are needed. These include:  Canes.  Walkers.  Scooters.  Crutches.  Turn on the lights when you go into a dark area. Replace any light bulbs as soon as they burn out.  Set up your furniture so you have a clear path. Avoid moving your furniture around.  If any of your floors are uneven, fix them.  If there are any pets around you, be aware of where they are.  Review your medicines with your doctor. Some medicines can make you feel dizzy. This can increase your chance of falling. Ask your doctor what other things that you can do to help prevent falls. This information is not intended to replace advice given to you by your health care provider. Make sure you discuss any questions you have with your health care provider. Document Released: 01/29/2009 Document Revised: 09/10/2015 Document Reviewed: 05/09/2014 Elsevier Interactive Patient Education  2017 [employer].

## 2018-06-25 DIAGNOSIS — Z992 Dependence on renal dialysis: Secondary | ICD-10-CM | POA: Diagnosis not present

## 2018-06-25 DIAGNOSIS — N186 End stage renal disease: Secondary | ICD-10-CM | POA: Diagnosis not present

## 2018-06-27 DIAGNOSIS — Z992 Dependence on renal dialysis: Secondary | ICD-10-CM | POA: Diagnosis not present

## 2018-06-27 DIAGNOSIS — N186 End stage renal disease: Secondary | ICD-10-CM | POA: Diagnosis not present

## 2018-06-29 DIAGNOSIS — N186 End stage renal disease: Secondary | ICD-10-CM | POA: Diagnosis not present

## 2018-06-29 DIAGNOSIS — Z992 Dependence on renal dialysis: Secondary | ICD-10-CM | POA: Diagnosis not present

## 2018-07-02 DIAGNOSIS — N186 End stage renal disease: Secondary | ICD-10-CM | POA: Diagnosis not present

## 2018-07-02 DIAGNOSIS — Z992 Dependence on renal dialysis: Secondary | ICD-10-CM | POA: Diagnosis not present

## 2018-07-04 ENCOUNTER — Other Ambulatory Visit: Payer: Self-pay | Admitting: Family Medicine

## 2018-07-04 DIAGNOSIS — N186 End stage renal disease: Secondary | ICD-10-CM | POA: Diagnosis not present

## 2018-07-04 DIAGNOSIS — Z992 Dependence on renal dialysis: Secondary | ICD-10-CM | POA: Diagnosis not present

## 2018-07-04 DIAGNOSIS — F410 Panic disorder [episodic paroxysmal anxiety] without agoraphobia: Secondary | ICD-10-CM

## 2018-07-05 ENCOUNTER — Encounter: Payer: Self-pay | Admitting: *Deleted

## 2018-07-06 DIAGNOSIS — N186 End stage renal disease: Secondary | ICD-10-CM | POA: Diagnosis not present

## 2018-07-06 DIAGNOSIS — Z992 Dependence on renal dialysis: Secondary | ICD-10-CM | POA: Diagnosis not present

## 2018-07-09 ENCOUNTER — Other Ambulatory Visit: Payer: Self-pay | Admitting: Family Medicine

## 2018-07-09 DIAGNOSIS — N186 End stage renal disease: Secondary | ICD-10-CM | POA: Diagnosis not present

## 2018-07-09 DIAGNOSIS — Z992 Dependence on renal dialysis: Secondary | ICD-10-CM | POA: Diagnosis not present

## 2018-07-09 DIAGNOSIS — I5023 Acute on chronic systolic (congestive) heart failure: Secondary | ICD-10-CM

## 2018-07-11 DIAGNOSIS — Z992 Dependence on renal dialysis: Secondary | ICD-10-CM | POA: Diagnosis not present

## 2018-07-11 DIAGNOSIS — N186 End stage renal disease: Secondary | ICD-10-CM | POA: Diagnosis not present

## 2018-07-13 DIAGNOSIS — N186 End stage renal disease: Secondary | ICD-10-CM | POA: Diagnosis not present

## 2018-07-13 DIAGNOSIS — Z992 Dependence on renal dialysis: Secondary | ICD-10-CM | POA: Diagnosis not present

## 2018-07-16 DIAGNOSIS — N186 End stage renal disease: Secondary | ICD-10-CM | POA: Diagnosis not present

## 2018-07-16 DIAGNOSIS — Z992 Dependence on renal dialysis: Secondary | ICD-10-CM | POA: Diagnosis not present

## 2018-07-17 DIAGNOSIS — Z992 Dependence on renal dialysis: Secondary | ICD-10-CM | POA: Diagnosis not present

## 2018-07-17 DIAGNOSIS — N186 End stage renal disease: Secondary | ICD-10-CM | POA: Diagnosis not present

## 2018-07-18 DIAGNOSIS — Z992 Dependence on renal dialysis: Secondary | ICD-10-CM | POA: Diagnosis not present

## 2018-07-18 DIAGNOSIS — N186 End stage renal disease: Secondary | ICD-10-CM | POA: Diagnosis not present

## 2018-07-20 DIAGNOSIS — Z992 Dependence on renal dialysis: Secondary | ICD-10-CM | POA: Diagnosis not present

## 2018-07-20 DIAGNOSIS — N186 End stage renal disease: Secondary | ICD-10-CM | POA: Diagnosis not present

## 2018-07-23 DIAGNOSIS — N186 End stage renal disease: Secondary | ICD-10-CM | POA: Diagnosis not present

## 2018-07-23 DIAGNOSIS — Z992 Dependence on renal dialysis: Secondary | ICD-10-CM | POA: Diagnosis not present

## 2018-07-25 DIAGNOSIS — Z992 Dependence on renal dialysis: Secondary | ICD-10-CM | POA: Diagnosis not present

## 2018-07-25 DIAGNOSIS — N186 End stage renal disease: Secondary | ICD-10-CM | POA: Diagnosis not present

## 2018-07-27 DIAGNOSIS — N186 End stage renal disease: Secondary | ICD-10-CM | POA: Diagnosis not present

## 2018-07-27 DIAGNOSIS — Z992 Dependence on renal dialysis: Secondary | ICD-10-CM | POA: Diagnosis not present

## 2018-07-30 ENCOUNTER — Other Ambulatory Visit (INDEPENDENT_AMBULATORY_CARE_PROVIDER_SITE_OTHER): Payer: Self-pay | Admitting: Vascular Surgery

## 2018-07-30 DIAGNOSIS — Z992 Dependence on renal dialysis: Secondary | ICD-10-CM | POA: Diagnosis not present

## 2018-07-30 DIAGNOSIS — N186 End stage renal disease: Secondary | ICD-10-CM | POA: Diagnosis not present

## 2018-07-31 ENCOUNTER — Ambulatory Visit (INDEPENDENT_AMBULATORY_CARE_PROVIDER_SITE_OTHER): Payer: Self-pay | Admitting: Vascular Surgery

## 2018-07-31 ENCOUNTER — Encounter (INDEPENDENT_AMBULATORY_CARE_PROVIDER_SITE_OTHER): Payer: Self-pay

## 2018-08-01 DIAGNOSIS — Z992 Dependence on renal dialysis: Secondary | ICD-10-CM | POA: Diagnosis not present

## 2018-08-01 DIAGNOSIS — N186 End stage renal disease: Secondary | ICD-10-CM | POA: Diagnosis not present

## 2018-08-03 DIAGNOSIS — N186 End stage renal disease: Secondary | ICD-10-CM | POA: Diagnosis not present

## 2018-08-03 DIAGNOSIS — Z992 Dependence on renal dialysis: Secondary | ICD-10-CM | POA: Diagnosis not present

## 2018-08-06 DIAGNOSIS — Z992 Dependence on renal dialysis: Secondary | ICD-10-CM | POA: Diagnosis not present

## 2018-08-06 DIAGNOSIS — N186 End stage renal disease: Secondary | ICD-10-CM | POA: Diagnosis not present

## 2018-08-08 DIAGNOSIS — Z992 Dependence on renal dialysis: Secondary | ICD-10-CM | POA: Diagnosis not present

## 2018-08-08 DIAGNOSIS — N186 End stage renal disease: Secondary | ICD-10-CM | POA: Diagnosis not present

## 2018-08-09 ENCOUNTER — Ambulatory Visit: Payer: Self-pay | Admitting: Family Medicine

## 2018-08-09 ENCOUNTER — Encounter: Payer: Self-pay | Admitting: Family Medicine

## 2018-08-10 DIAGNOSIS — Z992 Dependence on renal dialysis: Secondary | ICD-10-CM | POA: Diagnosis not present

## 2018-08-10 DIAGNOSIS — N186 End stage renal disease: Secondary | ICD-10-CM | POA: Diagnosis not present

## 2018-08-13 DIAGNOSIS — Z794 Long term (current) use of insulin: Secondary | ICD-10-CM | POA: Diagnosis not present

## 2018-08-13 DIAGNOSIS — N186 End stage renal disease: Secondary | ICD-10-CM | POA: Diagnosis not present

## 2018-08-13 DIAGNOSIS — Z992 Dependence on renal dialysis: Secondary | ICD-10-CM | POA: Diagnosis not present

## 2018-08-13 DIAGNOSIS — E119 Type 2 diabetes mellitus without complications: Secondary | ICD-10-CM | POA: Diagnosis not present

## 2018-08-15 DIAGNOSIS — N186 End stage renal disease: Secondary | ICD-10-CM | POA: Diagnosis not present

## 2018-08-15 DIAGNOSIS — Z992 Dependence on renal dialysis: Secondary | ICD-10-CM | POA: Diagnosis not present

## 2018-08-16 ENCOUNTER — Other Ambulatory Visit: Payer: Self-pay | Admitting: *Deleted

## 2018-08-16 DIAGNOSIS — F410 Panic disorder [episodic paroxysmal anxiety] without agoraphobia: Secondary | ICD-10-CM

## 2018-08-16 DIAGNOSIS — Z992 Dependence on renal dialysis: Secondary | ICD-10-CM | POA: Diagnosis not present

## 2018-08-16 DIAGNOSIS — N186 End stage renal disease: Secondary | ICD-10-CM | POA: Diagnosis not present

## 2018-08-17 DIAGNOSIS — Z992 Dependence on renal dialysis: Secondary | ICD-10-CM | POA: Diagnosis not present

## 2018-08-17 DIAGNOSIS — N186 End stage renal disease: Secondary | ICD-10-CM | POA: Diagnosis not present

## 2018-08-20 DIAGNOSIS — N186 End stage renal disease: Secondary | ICD-10-CM | POA: Diagnosis not present

## 2018-08-20 DIAGNOSIS — Z992 Dependence on renal dialysis: Secondary | ICD-10-CM | POA: Diagnosis not present

## 2018-08-22 ENCOUNTER — Other Ambulatory Visit: Payer: Self-pay | Admitting: Family Medicine

## 2018-08-22 DIAGNOSIS — Z992 Dependence on renal dialysis: Secondary | ICD-10-CM | POA: Diagnosis not present

## 2018-08-22 DIAGNOSIS — N186 End stage renal disease: Secondary | ICD-10-CM | POA: Diagnosis not present

## 2018-08-24 ENCOUNTER — Encounter
Admission: RE | Disposition: A | Discharge status: Home / Self Care | Payer: Self-pay | Source: Ambulatory Visit | Attending: Vascular Surgery

## 2018-08-24 ENCOUNTER — Ambulatory Visit
Admission: RE | Admit: 2018-08-24 | Discharge: 2018-08-24 | Disposition: A | Discharge status: Home / Self Care | Payer: Medicare Other | Source: Ambulatory Visit | Attending: Vascular Surgery | Admitting: Vascular Surgery

## 2018-08-24 ENCOUNTER — Other Ambulatory Visit (INDEPENDENT_AMBULATORY_CARE_PROVIDER_SITE_OTHER): Payer: Self-pay | Admitting: Vascular Surgery

## 2018-08-24 ENCOUNTER — Other Ambulatory Visit: Payer: Self-pay

## 2018-08-24 DIAGNOSIS — K219 Gastro-esophageal reflux disease without esophagitis: Secondary | ICD-10-CM | POA: Insufficient documentation

## 2018-08-24 DIAGNOSIS — Z87891 Personal history of nicotine dependence: Secondary | ICD-10-CM | POA: Diagnosis not present

## 2018-08-24 DIAGNOSIS — N186 End stage renal disease: Secondary | ICD-10-CM | POA: Insufficient documentation

## 2018-08-24 DIAGNOSIS — E785 Hyperlipidemia, unspecified: Secondary | ICD-10-CM | POA: Insufficient documentation

## 2018-08-24 DIAGNOSIS — Z955 Presence of coronary angioplasty implant and graft: Secondary | ICD-10-CM | POA: Diagnosis not present

## 2018-08-24 DIAGNOSIS — I252 Old myocardial infarction: Secondary | ICD-10-CM | POA: Insufficient documentation

## 2018-08-24 DIAGNOSIS — Z8614 Personal history of Methicillin resistant Staphylococcus aureus infection: Secondary | ICD-10-CM | POA: Diagnosis not present

## 2018-08-24 DIAGNOSIS — I2510 Atherosclerotic heart disease of native coronary artery without angina pectoris: Secondary | ICD-10-CM | POA: Diagnosis not present

## 2018-08-24 DIAGNOSIS — Z992 Dependence on renal dialysis: Secondary | ICD-10-CM | POA: Insufficient documentation

## 2018-08-24 DIAGNOSIS — Z951 Presence of aortocoronary bypass graft: Secondary | ICD-10-CM | POA: Diagnosis not present

## 2018-08-24 DIAGNOSIS — I509 Heart failure, unspecified: Secondary | ICD-10-CM | POA: Diagnosis not present

## 2018-08-24 DIAGNOSIS — I132 Hypertensive heart and chronic kidney disease with heart failure and with stage 5 chronic kidney disease, or end stage renal disease: Secondary | ICD-10-CM | POA: Diagnosis not present

## 2018-08-24 DIAGNOSIS — E1151 Type 2 diabetes mellitus with diabetic peripheral angiopathy without gangrene: Secondary | ICD-10-CM | POA: Diagnosis not present

## 2018-08-24 DIAGNOSIS — Z881 Allergy status to other antibiotic agents status: Secondary | ICD-10-CM | POA: Diagnosis not present

## 2018-08-24 DIAGNOSIS — E1122 Type 2 diabetes mellitus with diabetic chronic kidney disease: Secondary | ICD-10-CM | POA: Diagnosis not present

## 2018-08-24 DIAGNOSIS — T82858A Stenosis of vascular prosthetic devices, implants and grafts, initial encounter: Secondary | ICD-10-CM | POA: Diagnosis not present

## 2018-08-24 DIAGNOSIS — M1990 Unspecified osteoarthritis, unspecified site: Secondary | ICD-10-CM | POA: Insufficient documentation

## 2018-08-24 HISTORY — PX: A/V FISTULAGRAM: CATH118298

## 2018-08-24 LAB — GLUCOSE, CAPILLARY
Glucose-Capillary: 116 mg/dL — ABNORMAL HIGH (ref 70–99)
Glucose-Capillary: 121 mg/dL — ABNORMAL HIGH (ref 70–99)

## 2018-08-24 LAB — POTASSIUM (ARMC VASCULAR LAB ONLY): Potassium (ARMC vascular lab): 4.1 (ref 3.5–5.1)

## 2018-08-24 SURGERY — A/V FISTULAGRAM
Anesthesia: Moderate Sedation | Laterality: Left

## 2018-08-24 MED ORDER — CEFAZOLIN SODIUM-DEXTROSE 1-4 GM/50ML-% IV SOLN
1.0000 g | Freq: Once | INTRAVENOUS | Status: AC
  Administered 2018-08-24: 1 g via INTRAVENOUS

## 2018-08-24 MED ORDER — SODIUM CHLORIDE 0.9 % IV SOLN
INTRAVENOUS | Status: DC
  Administered 2018-08-24: 14:00:00 via INTRAVENOUS

## 2018-08-24 MED ORDER — MIDAZOLAM HCL 2 MG/ML PO SYRP: 8.0000 mg | ORAL_SOLUTION | Freq: Once | ORAL | Status: DC | PRN

## 2018-08-24 MED ORDER — HEPARIN SODIUM (PORCINE) 1000 UNIT/ML IJ SOLN
INTRAMUSCULAR | Status: DC | PRN
  Administered 2018-08-24: 3000 [IU] via INTRAVENOUS

## 2018-08-24 MED ORDER — MIDAZOLAM HCL 2 MG/2ML IJ SOLN
INTRAMUSCULAR | Status: DC | PRN
  Administered 2018-08-24: 2 mg via INTRAVENOUS
  Administered 2018-08-24: 1 mg via INTRAVENOUS

## 2018-08-24 MED ORDER — HYDROMORPHONE HCL 1 MG/ML IJ SOLN: 1.0000 mg | Freq: Once | INTRAMUSCULAR | Status: DC | PRN

## 2018-08-24 MED ORDER — METHYLPREDNISOLONE SODIUM SUCC 125 MG IJ SOLR: 125.0000 mg | Freq: Once | INTRAMUSCULAR | Status: DC | PRN

## 2018-08-24 MED ORDER — FENTANYL CITRATE (PF) 100 MCG/2ML IJ SOLN
INTRAMUSCULAR | Status: AC
  Filled 2018-08-24: qty 2

## 2018-08-24 MED ORDER — ONDANSETRON HCL 4 MG/2ML IJ SOLN: 4.0000 mg | Freq: Four times a day (QID) | INTRAMUSCULAR | Status: DC | PRN

## 2018-08-24 MED ORDER — DIPHENHYDRAMINE HCL 50 MG/ML IJ SOLN: 50.0000 mg | Freq: Once | INTRAMUSCULAR | Status: DC | PRN

## 2018-08-24 MED ORDER — MIDAZOLAM HCL 5 MG/5ML IJ SOLN
INTRAMUSCULAR | Status: AC
  Filled 2018-08-24: qty 5

## 2018-08-24 MED ORDER — TRAMADOL HCL 50 MG PO TABS: 50.0000 mg | ORAL_TABLET | Freq: Four times a day (QID) | ORAL | 3 refills | Status: DC | PRN

## 2018-08-24 MED ORDER — HEPARIN SODIUM (PORCINE) 1000 UNIT/ML IJ SOLN
INTRAMUSCULAR | Status: AC
  Filled 2018-08-24: qty 1

## 2018-08-24 MED ORDER — LIDOCAINE HCL (PF) 1 % IJ SOLN
INTRAMUSCULAR | Status: AC
  Filled 2018-08-24: qty 30

## 2018-08-24 MED ORDER — FAMOTIDINE 20 MG PO TABS: 40.0000 mg | ORAL_TABLET | Freq: Once | ORAL | Status: DC | PRN

## 2018-08-24 MED ORDER — FENTANYL CITRATE (PF) 100 MCG/2ML IJ SOLN
INTRAMUSCULAR | Status: DC | PRN
  Administered 2018-08-24: 25 ug via INTRAVENOUS
  Administered 2018-08-24: 50 ug via INTRAVENOUS

## 2018-08-24 SURGICAL SUPPLY — 18 items
BALLN DORADO 7X40X80 (BALLOONS) ×3
BALLN DORADO 9X40X80 (BALLOONS) ×3
BALLOON DORADO 7X40X80 (BALLOONS) ×1 IMPLANT
BALLOON DORADO 9X40X80 (BALLOONS) ×1 IMPLANT
CATH BEACON 5 .035 65 KMP TIP (CATHETERS) ×3 IMPLANT
DEVICE PRESTO INFLATION (MISCELLANEOUS) ×3 IMPLANT
DRAPE BRACHIAL (DRAPES) ×3 IMPLANT
NEEDLE ENTRY 21GA 7CM ECHOTIP (NEEDLE) ×3 IMPLANT
PACK ANGIOGRAPHY (CUSTOM PROCEDURE TRAY) ×3 IMPLANT
SET INTRO CAPELLA COAXIAL (SET/KITS/TRAYS/PACK) ×3 IMPLANT
SHEATH 9FRX11 (SHEATH) ×3 IMPLANT
SHEATH BRITE TIP 6FRX11 (SHEATH) ×3 IMPLANT
SHEATH BRITE TIP 6FRX5.5 (SHEATH) ×3 IMPLANT
SHEATH BRITE TIP 7FRX5.5 (SHEATH) ×3 IMPLANT
STENT VIABAHN 9X50X120 (Permanent Stent) ×3 IMPLANT
SUT MNCRL AB 4-0 PS2 18 (SUTURE) ×6 IMPLANT
WIRE G V18X300CM (WIRE) ×3 IMPLANT
WIRE MAGIC TORQUE 260C (WIRE) ×3 IMPLANT

## 2018-08-24 NOTE — Op Note (Signed)
OPERATIVE NOTE   PROCEDURE: 1. Contrast injection left arm brachiocephalic AV access 2. Percutaneous transluminal angioplasty and stent placement at the cephalic vein confluence left arm fistula with 2 additional angioplasties within the peripheral segment  PRE-OPERATIVE DIAGNOSIS: Complication of dialysis access                                                       End Stage Renal Disease  POST-OPERATIVE DIAGNOSIS: same as above   SURGEON: Katha Cabal, M.D.  ANESTHESIA: Conscious sedation was administered under my direct supervision by the interventional radiology RN. IV Versed plus fentanyl were utilized. Continuous ECG, pulse oximetry and blood pressure was monitored throughout the entire procedure.  Conscious sedation was for a total of 42 minutes.  ESTIMATED BLOOD LOSS: minimal  FINDING(S): 1. 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 male who  presents with malfunctioning left arm AV access.  The patient is scheduled for angiography with possible intervention of the AV access.  The patient is aware the risks include but are not limited to: bleeding, infection, thrombosis of the cannulated access, and possible anaphylactic reaction to the contrast.  The patient acknowledges if the access can not be salvaged a tunneled catheter will be needed and will be placed during this procedure.  The patient is aware of the risks of the procedure and elects to proceed with the angiogram and intervention.  DESCRIPTION: After full informed written consent was obtained, the patient was brought back to the Special Procedure suite and placed supine position.  Appropriate cardiopulmonary monitors were placed.  The left arm was prepped and draped in the standard  fashion.  Appropriate timeout is called. The left AV fistula was cannulated with a micropuncture needle.  Cannulation was performed with ultrasound guidance. Ultrasound was placed in a sterile sleeve, the AV access was interrogated and noted to be echolucent and compressible indicating patency. Image was recorded for the permanent record. The puncture is performed under continuous ultrasound visualization.   The microwire was advanced and the needle was exchanged for  a microsheath.  The J-wire was then advanced and a 6 Fr sheath inserted.  Hand injections were completed to image the access from the arterial anastomosis through the entire access.  The central venous structures were also imaged by hand injections.  Diagnostic interpretation: The brachiocephalic fistula is patent there are 2 areas in the arm 1 overlying the biceps and one up toward the deltoid of greater than 80% stenosis.  There is a greater than 80% stenosis at the confluence of the cephalic vein with the subclavian vein.  Central veins are widely patent.  Based on the images,  3000 units of heparin was given and a wire was negotiated through the strictures within the venous portion of the graft.  Initially an 8 mm and then a 9 mm Dorado balloon was used to angioplasty the confluence.  Follow-up imaging demonstrated greater than 50% residual stenosis the sheath was therefore upsized to a 9 French sheath and a 9 mm x 50 mm Viabahn was deployed across the stenoses and postdilated  with an 9 mm Dorado balloon.  The Dorado balloon was repositioned to the other 2 areas of stricture inflations were 20 atm for 1 minute.  Follow-up imaging demonstrates resolution of the stricture with less than 10% residual stenosis.  With rapid flow of contrast through the graft, the central venous anatomy is preserved.  A 4-0 Monocryl purse-string suture was sewn around the sheath.  The sheath was removed and light pressure was applied.  A sterile bandage was  applied to the puncture site.    COMPLICATIONS: None  CONDITION: Thomas [name], M.D [facility] Office: [phone]  08/24/2018 3:36 PM

## 2018-08-24 NOTE — H&P (Signed)
[facility] Admission History & Physical  MRN : [id number]  Thomas [name] is a 73 y.o. (06-25-1945) male who presents with chief complaint of non-functioning dialysis access.  History of Present Illness:  The patient is a 73 year old male with a past medical history of diabetes, coronary artery disease, essential hypertension, peripheral artery disease, hyperlipidemia, congestive heart failure, end-stage renal disease on chronic hemodialysis.   Patient endorses a history of progressively worsening inability to dialyze appropriately.  The patient has a left upper extremity dialysis access however over the last few days, has noted increased issues with cannulation, recirculation and prolonged bleeding.  Patient denies any issues with wound formation along the access.  Patient denies any fever, nausea vomiting.  Patient denies any chest pain or shortness of breath.  Patient denies any left hand pain or ulceration.  Presents for left upper extremity fistulogram with possible intervention with possible PermCath placement and attempt to restore function to his dialysis access.  Current Facility-Administered Medications  Medication Dose Route Frequency Provider Last Rate Last Dose  . 0.9 %  sodium chloride infusion   Intravenous Continuous Lakysha Kossman A, PA-C      . ceFAZolin (ANCEF) IVPB 1 g/50 mL premix  1 g Intravenous Once Kraven Calk A, PA-C      . diphenhydrAMINE (BENADRYL) injection 50 mg  50 mg Intravenous Once PRN Earma Nicolaou A, PA-C      . famotidine (PEPCID) tablet 40 mg  40 mg Oral Once PRN Natilie Krabbenhoft, Janalyn Harder, PA-C      . HYDROmorphone (DILAUDID) injection 1 mg  1 mg Intravenous Once PRN Quatavious Rossa A, PA-C      . methylPREDNISolone sodium succinate (SOLU-MEDROL) 125 mg/2 mL injection 125 mg  125 mg Intravenous Once PRN Lenis Nettleton A, PA-C      . midazolam (VERSED) 2 MG/ML syrup 8 mg  8 mg Oral Once PRN Kristyn Obyrne,  Deanna Boehlke A, PA-C      . ondansetron (ZOFRAN) injection 4 mg  4 mg Intravenous Q6H PRN Joniqua Sidle, Janalyn Harder, PA-C       Past Medical History:  Diagnosis Date  . Allergy   . Arthritis   . CAD (coronary artery disease)   . Chronic kidney disease   . Diabetes mellitus without complication ([place])   . Dyspnea   . Erosive esophagitis   . ESRD (end stage renal disease) on dialysis ([place]) 2019   MWF, Left arm fistula  . GERD (gastroesophageal reflux disease)   . Gouty arthropathy   . History of colonic polyps   . History of kidney stones   . History of MRSA infection   . Hyperkalemia   . Hyperlipidemia   . Hypertension   . Melanoma ([place])   . Myocardial infarction ([place]) 1986, 2019  . Peripheral vascular disease ([place])   . 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 Trigger finger of left hand   . Ureteral tumor    Past Surgical History:  Procedure Laterality Date  . A/V SHUNT INTERVENTION Left 03/12/2018   Procedure: A/V SHUNT INTERVENTION;  Surgeon: Algernon Huxley, MD;  Location: [facility];  Service: Cardiovascular;  Laterality: Left;  . AV FISTULA PLACEMENT Left 12/07/2017   Procedure: ARTERIOVENOUS (AV) FISTULA CREATION ( BRACHIOBASILIC STAGE );  Surgeon: Algernon Huxley, MD;  Location: [facility];  Service: Vascular;  Laterality: Left;  . CATARACT EXTRACTION    . CORONARY ARTERY BYPASS GRAFT  1996  .  CORONARY STENT INTERVENTION N/A 05/29/2017   Procedure: CORONARY STENT INTERVENTION;  Surgeon: Isaias Cowman, MD;  Location: [facility];  Service: Cardiovascular;  Laterality: N/A;  . DIALYSIS/PERMA CATHETER REMOVAL N/A 04/12/2018   Procedure: DIALYSIS/PERMA CATHETER REMOVAL;  Surgeon: Algernon Huxley, MD;  Location: [facility];  Service: Cardiovascular;  Laterality: N/A;  . EXCISION Ureteral Tumor  2010?   (benign) Dr. Quillian Quince  . EYE SURGERY Bilateral    Cataract Extraction with IOL  . LEFT HEART CATH AND CORONARY ANGIOGRAPHY N/A 10/18/2016    Procedure: Left Heart Cath and Coronary Angiography;  Surgeon: Isaias Cowman, MD;  Location: [facility];  Service: Cardiovascular;  Laterality: N/A;  . LEFT HEART CATH AND CORONARY ANGIOGRAPHY N/A 05/29/2017   Procedure: LEFT HEART CATH AND CORONARY ANGIOGRAPHY;  Surgeon: Isaias Cowman, MD;  Location: [facility];  Service: Cardiovascular;  Laterality: N/A;  . LOWER EXTREMITY ANGIOGRAPHY Right 10/31/2016   Procedure: Lower Extremity Angiography;  Surgeon: Algernon Huxley, MD;  Location: [facility];  Service: Cardiovascular;  Laterality: Right;  . LOWER EXTREMITY ANGIOGRAPHY Left 11/14/2016   Procedure: Lower Extremity Angiography;  Surgeon: Algernon Huxley, MD;  Location: [facility];  Service: Cardiovascular;  Laterality: Left;  . LOWER EXTREMITY INTERVENTION  11/14/2016   Procedure: Lower Extremity Intervention;  Surgeon: Algernon Huxley, MD;  Location: [facility];  Service: Cardiovascular;;  . open heart surgery    . RETINAL DETACHMENT SURGERY Right November 2107   Social History Social History   Tobacco Use  . Smoking status: Former Smoker    Packs/day: 1.00    Years: 51.00    Pack years: 51.00    Types: Cigarettes    Last attempt to quit: 06/21/2017    Years since quitting: 1.1  . Smokeless tobacco: Never Used  . Tobacco comment: started smoking at age 50-25. Smoked for 50 years  Substance Use Topics  . Alcohol use: Not Currently    Alcohol/week: 0.0 standard drinks    Frequency: Never  . Drug use: No   Family History Family History  Problem Relation Age of Onset  . Alzheimer's disease Mother   . Alzheimer's disease Brother   . Lung cancer Paternal Grandfather   Denies family history of peripheral artery disease, venous disease and renal disease.  Allergies  Allergen Reactions  . Ciprofloxacin Itching and Swelling    Facial swelling   . Hydralazine Nausea And Vomiting  . Trulicity [Dulaglutide]     Bloating   REVIEW OF  SYSTEMS (Negative unless checked)  Constitutional: [] Weight loss  [] Fever  [] Chills Cardiac: [] Chest pain   [] Chest pressure   [] Palpitations   [] Shortness of breath when laying flat   [] Shortness of breath at rest   [x] Shortness of breath with exertion. Vascular:  [] Pain in legs with walking   [] Pain in legs at rest   [] Pain in legs when laying flat   [] Claudication   [] Pain in feet when walking  [] Pain in feet at rest  [] Pain in feet when laying flat   [] History of DVT   [] Phlebitis   [x] Swelling in legs   [] Varicose veins   [] Non-healing ulcers Pulmonary:   [] Uses home oxygen   [] Productive cough   [] Hemoptysis   [] Wheeze  [] COPD   [] Asthma Neurologic:  [] Dizziness  [] Blackouts   [] Seizures   [] History of stroke   [] History of TIA  [] Aphasia   [] Temporary blindness   [] Dysphagia   []   Weakness or numbness in arms   [] Weakness or numbness in legs Musculoskeletal:  [] Arthritis   [] Joint swelling   [] Joint pain   [] Low back pain Hematologic:  [] Easy bruising  [] Easy bleeding   [] Hypercoagulable state   [] Anemic  [] Hepatitis Gastrointestinal:  [] Blood in stool   [] Vomiting blood  [] Gastroesophageal reflux/heartburn   [] Difficulty swallowing. Genitourinary:  [x] Chronic kidney disease   [] Difficult urination  [] Frequent urination  [] Burning with urination   [] Blood in urine Skin:  [] Rashes   [] Ulcers   [] Wounds Psychological:  [] History of anxiety   []  History of major depression.  Physical Examination  Vitals:   08/24/18 1407  BP: (!) 148/81  Pulse: 80  Resp: 18  Temp: 97.8 F (36.6 C)  TempSrc: Oral  SpO2: 96%  Weight: 73.5 kg  Height: 5\' 6"  (1.676 m)   Body mass index is 26.15 kg/m. Gen: WD/WN, NAD Head: [place]/AT, No temporalis wasting. Prominent temp pulse not noted. Ear/Nose/Throat: Hearing grossly intact, nares w/o erythema or drainage, oropharynx w/o Erythema/Exudate,  Eyes: Conjunctiva clear, sclera non-icteric Neck: Trachea midline.  No JVD.  Pulmonary:  Good air movement,  respirations not labored, no use of accessory muscles.  Cardiac: RRR, normal S1, S2. Vascular:  Vessel Right Left  Radial Palpable Palpable  Ulnar Palpable Palpable  Brachial Palpable Palpable  Carotid Palpable, without bruit Palpable, without bruit  Aorta Not palpable N/A  Femoral Palpable Palpable  Popliteal Palpable Palpable  PT Palpable Palpable  DP Palpable Palpable   Left Upper Extremity: Dialysis access with palpable thrill and bruit. Skin intact.   Gastrointestinal: soft, non-tender/non-distended. No guarding/reflex.  Musculoskeletal: M/S 5/5 throughout.  Extremities without ischemic changes.  No deformity or atrophy.  Neurologic: Sensation grossly intact in extremities.  Symmetrical.  Speech is fluent. Motor exam as listed above. Psychiatric: Judgment intact, Mood & affect appropriate for pt's clinical situation. Dermatologic: No rashes or ulcers noted.  No cellulitis or open wounds. Lymph : No Cervical, Axillary, or Inguinal lymphadenopathy.  CBC Lab Results  Component Value Date   WBC 9.5 05/15/2018   HGB 11.7 (L) 05/15/2018   HCT 36.7 (L) 05/15/2018   MCV 89 05/15/2018   PLT 328 05/15/2018   BMET    Component Value Date/Time   NA 136 03/18/2018 0554   NA 136 02/19/2018 1040   NA 142 04/03/2014 1535   K 4.9 03/18/2018 0554   K 4.3 04/03/2014 1535   CL 100 03/18/2018 0554   CL 110 (H) 04/03/2014 1535   CO2 29 03/18/2018 0554   CO2 27 04/03/2014 1535   GLUCOSE 167 (H) 03/18/2018 0554   GLUCOSE 89 04/03/2014 1535   BUN 39 (H) 03/18/2018 0554   BUN 45 (H) 02/19/2018 1040   BUN 14 04/03/2014 1535   CREATININE 3.87 (H) 03/18/2018 0554   CREATININE 1.13 04/03/2014 1535   CALCIUM 8.2 (L) 03/18/2018 0554   CALCIUM 8.2 (L) 04/03/2014 1535   GFRNONAA 14 (L) 03/18/2018 0554   GFRNONAA >60 04/03/2014 1535   GFRAA 17 (L) 03/18/2018 0554   GFRAA >60 04/03/2014 1535   CrCl cannot be calculated (Patient's most recent lab result is older than the maximum 21 days  allowed.).  COAG Lab Results  Component Value Date   INR 1.09 02/24/2018   INR 1.02 11/30/2017   INR 1.02 05/29/2017   Radiology No results found.  Assessment/Plan The patient is a 74 year old male with a past medical history of diabetes, coronary artery disease, essential hypertension, peripheral artery disease, hyperlipidemia, congestive heart  failure, end-stage renal disease on chronic hemodialysis. 1. ESRD: Patient with progressively worsening functioning of his left upper extremity dialysis access.  Over the last few dialysis treatments the patient has been experiencing problems with cannulation, recirculation and prolonged bleeding.  The patient presents today for left upper extremity fistulogram possible intervention with possible PermCath placement and attempt to restore functioning to his access to allow him to dialyze appropriately.  Procedure, risks and benefits explained to the patient all questions answered.  Patient wishes to proceed. 2.  Hyperlipidemia: Encouraged good control as its slows the progression of atherosclerotic disease. 3.  Diabetes: Encouraged good control as its slows the progression of atherosclerotic disease.  Discussed with Dr. Francene Castle, PA-C  08/24/2018 2:12 PM

## 2018-08-24 NOTE — Discharge Instructions (Signed)
Dialysis Fistulogram, Care After °This sheet gives you information about how to care for yourself after your procedure. Your health care provider may also give you more specific instructions. If you have problems or questions, contact your health care provider. °What can I expect after the procedure? °After the procedure, it is common to have: °· A small amount of discomfort in the area where the small, thin tube (catheter) was placed for the procedure. °· A small amount of bruising around the fistula. °· Sleepiness and tiredness (fatigue). °Follow these instructions at home: °Activity ° °· Rest at home and do not lift anything that is heavier than 5 lb (2.3 kg) on the day after your procedure. °· Return to your normal activities as told by your health care provider. Ask your health care provider what activities are safe for you. °· Do not drive or use heavy machinery while taking prescription pain medicine. °· Do not drive for 24 hours if you were given a medicine to help you relax (sedative) during your procedure. °Medicines ° °· Take over-the-counter and prescription medicines only as told by your health care provider. °Puncture site care °· Follow instructions from your health care provider about how to take care of the site where catheters were inserted. Make sure you: °? Wash your hands with soap and water before you change your bandage (dressing). If soap and water are not available, use hand sanitizer. °? Change your dressing as told by your health care provider. °? Leave stitches (sutures), skin glue, or adhesive strips in place. These skin closures may need to stay in place for 2 weeks or longer. If adhesive strip edges start to loosen and curl up, you may trim the loose edges. Do not remove adhesive strips completely unless your health care provider tells you to do that. °· Check your puncture area every day for signs of infection. Check for: °? Redness, swelling, or pain. °? Fluid or  blood. °? Warmth. °? Pus or a bad smell. °General instructions °· Do not take baths, swim, or use a hot tub until your health care provider approves. Ask your health care provider if you may take showers. You may only be allowed to take sponge baths. °· Monitor your dialysis fistula closely. Check to make sure that you can feel a vibration or buzz (a thrill) when you put your fingers over the fistula. °· Prevent damage to your graft or fistula: °? Do not wear tight-fitting clothing or jewelry on the arm or leg that has your graft or fistula. °? Tell all your health care providers that you have a dialysis fistula or graft. °? Do not allow blood draws, IVs, or blood pressure readings to be done in the arm that has your fistula or graft. °? Do not allow flu shots or vaccinations in the arm with your fistula or graft. °· Keep all follow-up visits as told by your health care provider. This is important. °Contact a health care provider if: °· You have redness, swelling, or pain at the site where the catheter was put in. °· You have fluid or blood coming from the catheter site. °· The catheter site feels warm to the touch. °· You have pus or a bad smell coming from the catheter site. °· You have a fever or chills. °Get help right away if: °· You feel weak. °· You have trouble balancing. °· You have trouble moving your arms or legs. °· You have problems with your speech or vision. °· You   can no longer feel a vibration or buzz when you put your fingers over your dialysis fistula.  The limb that was used for the procedure: ? Swells. ? Is painful. ? Is cold. ? Is discolored, such as blue or pale white.  You have chest pain or shortness of breath. Summary  After a dialysis fistulogram, it is common to have a small amount of discomfort or bruising in the area where the small, thin tube (catheter) was placed.  Rest at home on the day after your procedure. Return to your normal activities as told by your health care  provider.  Take over-the-counter and prescription medicines only as told by your health care provider.  Follow instructions from your health care provider about how to take care of the site where the catheter was inserted.  Keep all follow-up visits as told by your health care provider. This information is not intended to replace advice given to you by your health care provider. Make sure you discuss any questions you have with your health care provider. Document Released: 08/19/2013 Document Revised: 05/05/2017 Document Reviewed: 05/05/2017 Elsevier Interactive Patient Education  2019 [place]. Moderate Conscious Sedation, Adult, Care After These instructions provide you with information about caring for yourself after your procedure. Your health care provider may also give you more specific instructions. Your treatment has been planned according to current medical practices, but problems sometimes occur. Call your health care provider if you have any problems or questions after your procedure. What can I expect after the procedure? After your procedure, it is common:  To feel sleepy for several hours.  To feel clumsy and have poor balance for several hours.  To have poor judgment for several hours.  To vomit if you eat too soon. Follow these instructions at home: For at least 24 hours after the procedure:   Do not: ? Participate in activities where you could fall or become injured. ? Drive. ? Use heavy machinery. ? Drink alcohol. ? Take sleeping pills or medicines that cause drowsiness. ? Make important decisions or sign legal documents. ? Take care of children on your own.  Rest. Eating and drinking  Follow the diet recommended by your health care provider.  If you vomit: ? Drink water, juice, or soup when you can drink without vomiting. ? Make sure you have little or no nausea before eating solid foods. General instructions  Have a responsible adult stay with you  until you are awake and alert.  Take over-the-counter and prescription medicines only as told by your health care provider.  If you smoke, do not smoke without supervision.  Keep all follow-up visits as told by your health care provider. This is important. Contact a health care provider if:  You keep feeling nauseous or you keep vomiting.  You feel light-headed.  You develop a rash.  You have a fever. Get help right away if:  You have trouble breathing. This information is not intended to replace advice given to you by your health care provider. Make sure you discuss any questions you have with your health care provider. Document Released: 01/23/2013 Document Revised: 09/07/2015 Document Reviewed: 07/25/2015 Elsevier Interactive Patient Education  2019 [employer].

## 2018-08-25 ENCOUNTER — Encounter: Payer: Self-pay | Admitting: Vascular Surgery

## 2018-08-25 DIAGNOSIS — Z992 Dependence on renal dialysis: Secondary | ICD-10-CM | POA: Diagnosis not present

## 2018-08-25 DIAGNOSIS — N186 End stage renal disease: Secondary | ICD-10-CM | POA: Diagnosis not present

## 2018-08-27 ENCOUNTER — Telehealth: Payer: Self-pay | Admitting: *Deleted

## 2018-08-27 DIAGNOSIS — N186 End stage renal disease: Secondary | ICD-10-CM | POA: Diagnosis not present

## 2018-08-27 DIAGNOSIS — Z992 Dependence on renal dialysis: Secondary | ICD-10-CM | POA: Diagnosis not present

## 2018-08-27 NOTE — Telephone Encounter (Signed)
Contacted regarding scheduling lung screening scan, patient requests to wait until June to consider.

## 2018-08-29 DIAGNOSIS — Z992 Dependence on renal dialysis: Secondary | ICD-10-CM | POA: Diagnosis not present

## 2018-08-29 DIAGNOSIS — N186 End stage renal disease: Secondary | ICD-10-CM | POA: Diagnosis not present

## 2018-08-31 DIAGNOSIS — N186 End stage renal disease: Secondary | ICD-10-CM | POA: Diagnosis not present

## 2018-08-31 DIAGNOSIS — Z992 Dependence on renal dialysis: Secondary | ICD-10-CM | POA: Diagnosis not present

## 2018-09-03 DIAGNOSIS — N186 End stage renal disease: Secondary | ICD-10-CM | POA: Diagnosis not present

## 2018-09-03 DIAGNOSIS — Z992 Dependence on renal dialysis: Secondary | ICD-10-CM | POA: Diagnosis not present

## 2018-09-05 DIAGNOSIS — Z992 Dependence on renal dialysis: Secondary | ICD-10-CM | POA: Diagnosis not present

## 2018-09-05 DIAGNOSIS — N186 End stage renal disease: Secondary | ICD-10-CM | POA: Diagnosis not present

## 2018-09-07 DIAGNOSIS — N186 End stage renal disease: Secondary | ICD-10-CM | POA: Diagnosis not present

## 2018-09-07 DIAGNOSIS — Z992 Dependence on renal dialysis: Secondary | ICD-10-CM | POA: Diagnosis not present

## 2018-09-10 DIAGNOSIS — N186 End stage renal disease: Secondary | ICD-10-CM | POA: Diagnosis not present

## 2018-09-10 DIAGNOSIS — Z992 Dependence on renal dialysis: Secondary | ICD-10-CM | POA: Diagnosis not present

## 2018-09-11 ENCOUNTER — Other Ambulatory Visit: Payer: Self-pay | Admitting: Gastroenterology

## 2018-09-11 DIAGNOSIS — R1310 Dysphagia, unspecified: Secondary | ICD-10-CM

## 2018-09-12 DIAGNOSIS — N186 End stage renal disease: Secondary | ICD-10-CM | POA: Diagnosis not present

## 2018-09-12 DIAGNOSIS — Z992 Dependence on renal dialysis: Secondary | ICD-10-CM | POA: Diagnosis not present

## 2018-09-14 DIAGNOSIS — N186 End stage renal disease: Secondary | ICD-10-CM | POA: Diagnosis not present

## 2018-09-14 DIAGNOSIS — Z992 Dependence on renal dialysis: Secondary | ICD-10-CM | POA: Diagnosis not present

## 2018-09-16 DIAGNOSIS — N186 End stage renal disease: Secondary | ICD-10-CM | POA: Diagnosis not present

## 2018-09-16 DIAGNOSIS — Z992 Dependence on renal dialysis: Secondary | ICD-10-CM | POA: Diagnosis not present

## 2018-09-17 ENCOUNTER — Ambulatory Visit: Payer: Medicare Other

## 2018-09-17 DIAGNOSIS — Z23 Encounter for immunization: Secondary | ICD-10-CM | POA: Diagnosis not present

## 2018-09-17 DIAGNOSIS — N186 End stage renal disease: Secondary | ICD-10-CM | POA: Diagnosis not present

## 2018-09-17 DIAGNOSIS — Z992 Dependence on renal dialysis: Secondary | ICD-10-CM | POA: Diagnosis not present

## 2018-09-18 ENCOUNTER — Other Ambulatory Visit: Payer: Self-pay | Admitting: Gastroenterology

## 2018-09-18 ENCOUNTER — Ambulatory Visit
Admission: RE | Admit: 2018-09-18 | Discharge: 2018-09-18 | Disposition: A | Discharge status: Home / Self Care | Payer: Medicare Other | Source: Ambulatory Visit | Attending: Gastroenterology | Admitting: Gastroenterology

## 2018-09-18 ENCOUNTER — Other Ambulatory Visit: Payer: Self-pay

## 2018-09-18 DIAGNOSIS — R1310 Dysphagia, unspecified: Secondary | ICD-10-CM | POA: Insufficient documentation

## 2018-09-18 DIAGNOSIS — R1312 Dysphagia, oropharyngeal phase: Secondary | ICD-10-CM | POA: Diagnosis not present

## 2018-09-19 DIAGNOSIS — Z992 Dependence on renal dialysis: Secondary | ICD-10-CM | POA: Diagnosis not present

## 2018-09-19 DIAGNOSIS — Z23 Encounter for immunization: Secondary | ICD-10-CM | POA: Diagnosis not present

## 2018-09-19 DIAGNOSIS — N186 End stage renal disease: Secondary | ICD-10-CM | POA: Diagnosis not present

## 2018-09-20 ENCOUNTER — Other Ambulatory Visit: Payer: Self-pay | Admitting: Family Medicine

## 2018-09-21 DIAGNOSIS — Z23 Encounter for immunization: Secondary | ICD-10-CM | POA: Diagnosis not present

## 2018-09-21 DIAGNOSIS — N186 End stage renal disease: Secondary | ICD-10-CM | POA: Diagnosis not present

## 2018-09-21 DIAGNOSIS — Z992 Dependence on renal dialysis: Secondary | ICD-10-CM | POA: Diagnosis not present

## 2018-09-24 ENCOUNTER — Telehealth: Payer: Self-pay | Admitting: Family Medicine

## 2018-09-24 DIAGNOSIS — Z23 Encounter for immunization: Secondary | ICD-10-CM | POA: Diagnosis not present

## 2018-09-24 DIAGNOSIS — Z992 Dependence on renal dialysis: Secondary | ICD-10-CM | POA: Diagnosis not present

## 2018-09-24 DIAGNOSIS — N186 End stage renal disease: Secondary | ICD-10-CM | POA: Diagnosis not present

## 2018-09-24 NOTE — Telephone Encounter (Signed)
Thomas [name] wants to know if she can get a [place] 100 sample for [employer].

## 2018-09-24 NOTE — Telephone Encounter (Signed)
Is it okay to give if we have some?  Thanks,   -Mickel Baas

## 2018-09-25 ENCOUNTER — Ambulatory Visit (INDEPENDENT_AMBULATORY_CARE_PROVIDER_SITE_OTHER): Payer: Medicare Other | Admitting: Family Medicine

## 2018-09-25 ENCOUNTER — Other Ambulatory Visit: Payer: Self-pay

## 2018-09-25 ENCOUNTER — Encounter: Payer: Self-pay | Admitting: Family Medicine

## 2018-09-25 VITALS — BP 140/72 | HR 81 | Temp 97.9°F | Resp 16 | Wt 161.0 lb

## 2018-09-25 DIAGNOSIS — Z0000 Encounter for general adult medical examination without abnormal findings: Secondary | ICD-10-CM

## 2018-09-25 DIAGNOSIS — N183 Chronic kidney disease, stage 3 (moderate): Secondary | ICD-10-CM

## 2018-09-25 DIAGNOSIS — I10 Essential (primary) hypertension: Secondary | ICD-10-CM | POA: Diagnosis not present

## 2018-09-25 DIAGNOSIS — I5023 Acute on chronic systolic (congestive) heart failure: Secondary | ICD-10-CM

## 2018-09-25 DIAGNOSIS — N189 Chronic kidney disease, unspecified: Secondary | ICD-10-CM

## 2018-09-25 DIAGNOSIS — I739 Peripheral vascular disease, unspecified: Secondary | ICD-10-CM

## 2018-09-25 DIAGNOSIS — E1122 Type 2 diabetes mellitus with diabetic chronic kidney disease: Secondary | ICD-10-CM | POA: Diagnosis not present

## 2018-09-25 DIAGNOSIS — N179 Acute kidney failure, unspecified: Secondary | ICD-10-CM | POA: Diagnosis not present

## 2018-09-25 DIAGNOSIS — Z125 Encounter for screening for malignant neoplasm of prostate: Secondary | ICD-10-CM

## 2018-09-25 DIAGNOSIS — Z992 Dependence on renal dialysis: Secondary | ICD-10-CM

## 2018-09-25 NOTE — Telephone Encounter (Signed)
He can one or two sample pens if we have them.

## 2018-09-25 NOTE — Telephone Encounter (Signed)
Pt advised.   Thanks,   -Dewain Platz  

## 2018-09-25 NOTE — Patient Instructions (Signed)
.   Please review the attached list of medications and notify my office if there are any errors.   . Please bring all of your medications to every appointment so we can make sure that our medication list is the same as yours.    The CDC recommends two doses of Shingrix (the shingles vaccine) separated by 2 to 6 months for adults age 75 years and older. I recommend checking with your insurance plan regarding coverage for this vaccine.   

## 2018-09-25 NOTE — Progress Notes (Signed)
Patient: Thomas [name], Male    DOB: Sep 17, 1945, 73 y.o.   MRN: [id number] Visit Date: 09/25/2018  Today's Provider: Lelon Huh, MD   Chief Complaint  Patient presents with   Annual Exam   Hypertension   Hyperlipidemia   Diabetes   Subjective:     Complete Physical Thomas [name] is a 73 y.o. male. He feels fairly well. He reports exercising daily. He reports he is sleeping fairly well.  -----------------------------------------------------------   Follow up for Insomnia:  The patient was last seen for this 5 months ago. Changes made at last visit include none.  He reports good compliance with treatment. He feels that condition is stable. He is not having side effects.   ------------------------------------------------------------------------------------  Follow up for Panic Attack:  The patient was last seen for this 5 months ago. Changes made at last visit include none.  He reports good compliance with treatment. He feels that condition is Improved. He is not having side effects.   ------------------------------------------------------------------------------------  Hypertension, follow-up:  BP Readings from Last 3 Encounters:  09/25/18 140/72  08/24/18 134/79  06/22/18 138/62    He was last seen for hypertension 5 months ago.  BP at that visit was 118/64. Management since that visit includes changed Torsemide to 176m daily. He reports good compliance with treatment. He is not having side effects.  He is exercising. He is adherent to low salt diet.   Outside blood pressures are 120-140/ 70. He is experiencing lower extremity edema.  Patient denies chest pain, chest pressure/discomfort, claudication, dyspnea, exertional chest pressure/discomfort, fatigue, irregular heart beat, near-syncope, orthopnea, palpitations, paroxysmal nocturnal dyspnea, syncope and tachypnea.   Cardiovascular risk factors include advanced age (older than 536for  men, 656for women), diabetes mellitus, dyslipidemia, hypertension and male gender.  Use of agents associated with hypertension: NSAIDS.     Weight trend: stable Wt Readings from Last 3 Encounters:  09/25/18 161 lb (73 kg)  08/24/18 162 lb (73.5 kg)  06/22/18 159 lb 9.6 oz (72.4 kg)    Current diet: well balanced  ------------------------------------------------------------------------  Diabetes Mellitus Type II, Follow-up:   Lab Results  Component Value Date   HGBA1C 7.3 (A) 04/26/2018   HGBA1C 9.3 (A) 01/24/2018   HGBA1C 10.9 (A) 12/13/2017    Last seen for diabetes 5 months ago.  Management since then includes no changes. He reports good compliance with treatment. He is not having side effects.  Current symptoms include none and have been stable. Home blood sugar records: fasting range: 108-120  Episodes of hypoglycemia? yes - occurred once   Current Insulin Regimen: TTyler AasMost Recent Eye Exam: 05/03/2018 Weight trend: stable Prior visit with dietician: No Current exercise: walking Current diet habits: well balanced  Pertinent Labs:    Component Value Date/Time   CHOL 140 06/30/2017 0816   TRIG 115 06/30/2017 0816   HDL 47 06/30/2017 0816   LDLCALC 70 06/30/2017 0816   CREATININE 3.87 (H) 03/18/2018 0554   CREATININE 1.13 04/03/2014 1535    Wt Readings from Last 3 Encounters:  09/25/18 161 lb (73 kg)  08/24/18 162 lb (73.5 kg)  06/22/18 159 lb 9.6 oz (72.4 kg)    ------------------------------------------------------------------------  Follow up for CKD:  The patient was last seen for this 5 months ago. Changes made at last visit include none; followed by Nephrology.  He reports good compliance with treatment. He feels that condition is Unchanged. He is not having side effects.   ------------------------------------------------------------------------------------  Lipid/Cholesterol, Follow-up:   Last seen for this 5 months ago.  Management  changes since that visit include none. . Last Lipid Panel:    Component Value Date/Time   CHOL 140 06/30/2017 0816   TRIG 115 06/30/2017 0816   HDL 47 06/30/2017 0816   CHOLHDL 3.0 06/30/2017 0816   LDLCALC 70 06/30/2017 0816    Risk factors for vascular disease include diabetes mellitus, hypercholesterolemia and hypertension  He reports good compliance with treatment. He is not having side effects.  Current symptoms include none and have been stable. Weight trend: stable Prior visit with dietician: no Current diet: well balanced Current exercise: walking  Wt Readings from Last 3 Encounters:  09/25/18 161 lb (73 kg)  08/24/18 162 lb (73.5 kg)  06/22/18 159 lb 9.6 oz (72.4 kg)    -------------------------------------------------------------------  Review of Systems  Constitutional: Negative for appetite change, chills, fatigue and fever.  HENT: Negative for congestion, ear pain, hearing loss, nosebleeds and trouble swallowing.   Eyes: Negative for pain and visual disturbance.  Respiratory: Negative for cough, chest tightness and shortness of breath.   Cardiovascular: Positive for leg swelling. Negative for chest pain and palpitations.  Gastrointestinal: Positive for abdominal distention, constipation and nausea. Negative for abdominal pain, blood in stool, diarrhea and vomiting.  Endocrine: Negative for polydipsia, polyphagia and polyuria.  Genitourinary: Negative for dysuria and flank pain.  Musculoskeletal: Negative for arthralgias, back pain, joint swelling, myalgias and neck stiffness.  Skin: Negative for color change, rash and wound.  Neurological: Negative for dizziness, tremors, seizures, speech difficulty, weakness, light-headedness and headaches.  Hematological: Bruises/bleeds easily.  Psychiatric/Behavioral: Negative for behavioral problems, confusion, decreased concentration, dysphoric mood and sleep disturbance. The patient is not nervous/anxious.   All other  systems reviewed and are negative.   Social History   Socioeconomic History   Marital status: Married    Spouse name: Not on file   Number of children: 2   Years of education: Not on file   Highest education level: Some college, no degree  Occupational History   Occupation: Retired  Scientist, product/process development strain: Not hard at [employer] insecurity:    Worry: Never true    Inability: Never true   Transportation needs:    Medical: No    Non-medical: No  Tobacco Use   Smoking status: Former Smoker    Packs/day: 1.00    Years: 51.00    Pack years: 51.00    Types: Cigarettes    Last attempt to quit: 06/21/2017    Years since quitting: 1.2   Smokeless tobacco: Never Used   Tobacco comment: started smoking at age [age]. Smoked for 50 years  Substance and Sexual Activity   Alcohol use: Not Currently    Alcohol/week: 0.0 standard drinks    Frequency: Never   Drug use: No   Sexual activity: Not on file  Lifestyle   Physical activity:    Days per week: 0 days    Minutes per session: 0 min   Stress: Only a little  Relationships   Social connections:    Talks on phone: Patient refused    Gets together: Patient refused    Attends religious service: Patient refused    Active member of club or organization: Patient refused    Attends meetings of clubs or organizations: Patient refused    Relationship status: Patient refused   Intimate partner violence:    Fear of current or ex partner: Patient refused  Emotionally abused: Patient refused    Physically abused: Patient refused    Forced sexual activity: Patient refused  Other Topics Concern   Not on file  Social History Narrative   Not on file    Past Medical History:  Diagnosis Date   Allergy    Arthritis    CAD (coronary artery disease)    Chronic kidney disease    Diabetes mellitus without complication (HCC)    Dyspnea    Erosive esophagitis    ESRD (end stage renal disease)  on dialysis ([place]) 2019   MWF, Left arm fistula   GERD (gastroesophageal reflux disease)    Gouty arthropathy    History of colonic polyps    History of kidney stones    History of MRSA infection    Hyperkalemia    Hyperlipidemia    Hypertension    Melanoma ([place])    Myocardial infarction ([place]) 1986, 2019   Peripheral vascular disease ([place])    Squamous cell cancer of external ear, right    07/2016   Trigger finger of left hand    Ureteral tumor      Patient Active Problem List   Diagnosis Date Noted   ESRD on dialysis ([place]) 06/19/2018   Dependence on hemodialysis ([place]) 03/27/2018   Panic attacks 03/27/2018   Other insomnia 03/27/2018   Acute respiratory failure ([place]) 02/24/2018   PAD (peripheral artery disease) ([place]) 11/17/2017   Chest pain 07/17/2017   Emphysema lung ([place]) 07/17/2017   Acute on chronic systolic CHF (congestive heart failure), NYHA class 2 ([place]) 06/22/2017   CKD (chronic kidney disease) stage 5, GFR less than 15 ml/min (HCC) 17/61/6073   Systolic CHF ([place]) 71/09/2692   Acute on chronic renal failure ([place]) 05/24/2017   Abnormal ankle brachial index (ABI) 10/13/2016   Intermittent claudication ([place]) 09/30/2016   PVC (premature ventricular contraction) 09/30/2016   Hyperlipidemia 09/30/2016   Aortic atherosclerosis ([place]) 06/17/2016   Duodenitis 11/24/2014   Erosive esophagitis 11/24/2014   Personal history of methicillin resistant Staphylococcus aureus 11/24/2014   Melanoma of skin ([place]) 11/24/2014   Acquired trigger finger 11/24/2014   Neoplasm of genitourinary organs 11/24/2014   DM (diabetes mellitus), type 2 with renal complications ([place]) 85/46/2703   History of smoking 30 or more pack years 08/26/2009   Allergic rhinitis 08/06/2006   Arthritis due to gout 07/28/2006   History of colon polyps 05/09/2003   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 06/09/1995   Essential (primary)  hypertension 06/09/1995    Past Surgical History:  Procedure Laterality Date   A/V FISTULAGRAM Left 08/24/2018   Procedure: A/V FISTULAGRAM;  Surgeon: Katha Cabal, MD;  Location: [facility];  Service: Cardiovascular;  Laterality: Left;   A/V SHUNT INTERVENTION Left 03/12/2018   Procedure: A/V SHUNT INTERVENTION;  Surgeon: Algernon Huxley, MD;  Location: [facility];  Service: Cardiovascular;  Laterality: Left;   AV FISTULA PLACEMENT Left 12/07/2017   Procedure: ARTERIOVENOUS (AV) FISTULA CREATION ( BRACHIOBASILIC STAGE );  Surgeon: Algernon Huxley, MD;  Location: [facility];  Service: Vascular;  Laterality: Left;   CATARACT EXTRACTION     CORONARY ARTERY BYPASS GRAFT  1996   CORONARY STENT INTERVENTION N/A 05/29/2017   Procedure: CORONARY STENT INTERVENTION;  Surgeon: Isaias Cowman, MD;  Location: [facility];  Service: Cardiovascular;  Laterality: N/A;   DIALYSIS/PERMA CATHETER REMOVAL N/A 04/12/2018   Procedure: DIALYSIS/PERMA CATHETER REMOVAL;  Surgeon: Algernon Huxley, MD;  Location:  [facility];  Service: Cardiovascular;  Laterality: N/A;   EXCISION Ureteral Tumor  2010?   (benign) Dr. Quillian Quince   EYE SURGERY Bilateral    Cataract Extraction with IOL   LEFT HEART CATH AND CORONARY ANGIOGRAPHY N/A 10/18/2016   Procedure: Left Heart Cath and Coronary Angiography;  Surgeon: Isaias Cowman, MD;  Location: [facility];  Service: Cardiovascular;  Laterality: N/A;   LEFT HEART CATH AND CORONARY ANGIOGRAPHY N/A 05/29/2017   Procedure: LEFT HEART CATH AND CORONARY ANGIOGRAPHY;  Surgeon: Isaias Cowman, MD;  Location: [facility];  Service: Cardiovascular;  Laterality: N/A;   LOWER EXTREMITY ANGIOGRAPHY Right 10/31/2016   Procedure: Lower Extremity Angiography;  Surgeon: Algernon Huxley, MD;  Location: [facility];  Service: Cardiovascular;  Laterality: Right;   LOWER EXTREMITY ANGIOGRAPHY Left 11/14/2016   Procedure:  Lower Extremity Angiography;  Surgeon: Algernon Huxley, MD;  Location: [facility];  Service: Cardiovascular;  Laterality: Left;   LOWER EXTREMITY INTERVENTION  11/14/2016   Procedure: Lower Extremity Intervention;  Surgeon: Algernon Huxley, MD;  Location: [facility];  Service: Cardiovascular;;   open heart surgery     RETINAL DETACHMENT SURGERY Right November 2107    His family history includes Alzheimer's disease in his brother and mother; Lung cancer in his paternal grandfather.   Current Outpatient Medications:    acetaminophen (TYLENOL) 500 MG tablet, Take 500 mg by mouth every 8 (eight) hours as needed for mild pain or headache., Disp: , Rfl:    ALPRAZolam (XANAX) 0.25 MG tablet, TAKE 1 TABLET BY MOUTH ONCE DAILY AS NEEDED FOR ANXIETY, Disp: 20 tablet, Rfl: 5   aspirin EC 81 MG tablet, Take 81 mg by mouth daily., Disp: , Rfl:    atorvastatin (LIPITOR) 80 MG tablet, TAKE 1 TABLET BY MOUTH ONCE DAILY, Disp: 30 tablet, Rfl: 5   B Complex-C-Folic Acid (RENA-VITE PO), Take by mouth daily., Disp: , Rfl:    Blood Glucose Monitoring Suppl (ONE TOUCH ULTRA 2) w/Device KIT, Use to check gluocose daily for type 2 diabetes, Disp: 1 each, Rfl: 0   calcium acetate (PHOSLO) 667 MG capsule, TAKE 1 CAPSULE BY MOUTH 3 TIMES DAILY WITH MEALS, Disp: 90 capsule, Rfl: 5   clopidogrel (PLAVIX) 75 MG tablet, TAKE 1 TABLET BY MOUTH ONCE DAILY, Disp: 30 tablet, Rfl: 2   glucose blood test strip, Contour Ascensia test strips and lancets Use to check blood sugar daily for type 2 diabetes, E11.9., Disp: 100 each, Rfl: 4   insulin degludec (TRESIBA FLEXTOUCH) 100 UNIT/ML SOPN FlexTouch Pen, Inject 0.06 mLs (6 Units total) into the skin daily. Increase by 2 units a day if fasting glucose is over 200, Disp: 3 mL, Rfl: 0   isosorbide mononitrate (IMDUR) 60 MG 24 hr tablet, TAKE 1 TABLET BY MOUTH ONCE DAILY, Disp: 30 tablet, Rfl: 5   LORazepam (ATIVAN) 0.5 MG tablet, Take 1 tablet (0.5 mg total)  by mouth at bedtime as needed for sleep., Disp: 30 tablet, Rfl: 3   losartan (COZAAR) 25 MG tablet, Take 1 tablet (25 mg total) by mouth every evening., Disp: 30 tablet, Rfl: 6   magnesium oxide (MAG-OX) 400 MG tablet, Take 400 mg by mouth daily., Disp: , Rfl:    metoprolol succinate (TOPROL-XL) 50 MG 24 hr tablet, TAKE 1 TABLET BY MOUTH ONCE DAILY, Disp: 30 tablet, Rfl: 5   pantoprazole (PROTONIX) 40 MG tablet, Take 1 tablet (40 mg total) by mouth daily.,  Disp: 30 tablet, Rfl: 12   polycarbophil (FIBERCON) 625 MG tablet, Take 625 mg by mouth as needed., Disp: , Rfl:    sodium bicarbonate 650 MG tablet, Take 650 mg by mouth 2 (two) times daily., Disp: , Rfl:    tamsulosin (FLOMAX) 0.4 MG CAPS capsule, TAKE 1 CAPSULE BY MOUTH ONCE DAILY 30 MINUTES AFTER LARGEST MEAL., Disp: 30 capsule, Rfl: 5   torsemide (DEMADEX) 100 MG tablet, Take 100 mg by mouth daily. Do not take on dialysis days, Disp: , Rfl:    traMADol (ULTRAM) 50 MG tablet, Take 1 tablet (50 mg total) by mouth every 6 (six) hours as needed (Pain)., Disp: 28 tablet, Rfl: 3   traZODone (DESYREL) 50 MG tablet, TAKE 1/2 TO 1 TABLET BY MOUTH AT BEDTIMEAS NEEDED SLEEP, Disp: 30 tablet, Rfl: 5  Patient Care Team: Birdie Sons, MD as PCP - General (Family Medicine) Isaias Cowman, MD as Consulting Physician (Cardiology) Jannet Mantis, MD as Consulting Physician (Dermatology) Anell Barr, OD as Consulting Physician (Optometry) Murlean Iba, MD as Consulting Physician (Internal Medicine) Algernon Huxley, MD as Referring Physician (Vascular Surgery)     Objective:    Vitals: BP 140/72 (BP Location: Left Arm, Patient Position: Sitting, Cuff Size: Normal)    Pulse 81    Temp 97.9 F (36.6 C) (Oral)    Resp 16    Wt 161 lb (73 kg)    SpO2 95% Comment: room air   BMI 25.99 kg/m   Physical Exam  Activities of Daily Living In your present state of health, do you have any difficulty performing the following  activities: 08/24/2018 06/22/2018  Hearing? N N  Vision? N N  Comment - Wears eye glasses daily.   Difficulty concentrating or making decisions? N N  Walking or climbing stairs? N Y  Comment - Due to right knee pain.   Dressing or bathing? N N  Doing errands, shopping? - Y  Comment - Not driving currently.   Preparing Food and eating ? - N  Using the Toilet? - N  In the past six months, have you accidently leaked urine? - N  Do you have problems with loss of bowel control? - N  Managing your Medications? - Y  Comment - Daughter manages medications.  Managing your Finances? - N  Housekeeping or managing your Housekeeping? - N  Some recent data might be hidden    Fall Risk Assessment Fall Risk  06/22/2018 04/26/2018 06/29/2017 06/20/2017 06/07/2017  Falls in the past year? 0 0 No No No  Follow up - Falls evaluation completed - - -     Depression Screen PHQ 2/9 Scores 06/22/2018 07/24/2017 06/29/2017 06/20/2017  PHQ - 2 Score 0 0 0 0  PHQ- 9 Score - 3 9 0    6CIT Screen 06/03/2016  What Year? 0 points  What month? 0 points  What time? 0 points  Count back from 20 0 points  Months in reverse 0 points  Repeat phrase 6 points  Total Score 6       Assessment & Plan:    Annual Physical Reviewed patient's Family Medical History Reviewed and updated list of patient's medical providers Assessment of cognitive impairment was done Assessed patient's functional ability Established a written schedule for health screening [place] Completed and Reviewed  Exercise Activities and Dietary recommendations Goals     DIET - REDUCE SODIUM INTAKE     Recommend current diet regimen of eating low  sodium heart healthy foods.      Exercise      Starting in spring 2018, I will start walking daily for a quarter mile and work up to 1 mile daily.       Immunization History  Administered Date(s) Administered   Influenza Split 03/01/2010   Influenza, High Dose Seasonal PF  01/31/2017, 01/08/2018   Influenza,inj,Quad PF,6+ Mos 01/01/2013   Influenza-Unspecified 01/16/2014, 01/06/2018   Pneumococcal Conjugate-13 11/20/2013   Pneumococcal Polysaccharide-23 02/24/2004, 08/26/2009   Td 04/18/2001    Health Maintenance  Topic Date Due   FOOT EXAM  09/30/2017   TETANUS/TDAP  04/18/2026 (Originally 04/19/2011)   HEMOGLOBIN A1C  10/25/2018   INFLUENZA VACCINE  11/17/2018   OPHTHALMOLOGY EXAM  05/04/2019   COLONOSCOPY  05/04/2023   Hepatitis C Screening  Completed   PNA vac Low Risk Adult  Completed     Discussed health benefits of physical activity, and encouraged him to engage in regular exercise appropriate for his age and condition.    ------------------------------------------------------------------------------------------------------------  1. Annual physical exam   2. Acute on chronic systolic congestive heart failure (HCC) Well compensated. Continue current medications.    3. Essential (primary) hypertension Well controlled.  Continue current medications.    4. Type 2 diabetes mellitus with stage 3 chronic kidney disease, without long-term current use of insulin (HCC)  - Hemoglobin A1c - CBC  5. Acute renal failure superimposed on chronic kidney disease, on chronic dialysis, unspecified acute renal failure type ([place])  - Comprehensive metabolic panel  6. PAD (peripheral artery disease) (HCC)  - Lipid panel - CBC  7. Prostate cancer screening  - PSA   The entirety of the information documented in the History of Present Illness, Review of Systems and Physical Exam were personally obtained by me. Portions of this information were initially documented by Meyer Cory, CMA and reviewed by me for thoroughness and accuracy.    Lelon Huh, MD  [facility]

## 2018-09-26 DIAGNOSIS — Z992 Dependence on renal dialysis: Secondary | ICD-10-CM | POA: Diagnosis not present

## 2018-09-26 DIAGNOSIS — N186 End stage renal disease: Secondary | ICD-10-CM | POA: Diagnosis not present

## 2018-09-26 DIAGNOSIS — Z23 Encounter for immunization: Secondary | ICD-10-CM | POA: Diagnosis not present

## 2018-09-27 DIAGNOSIS — N189 Chronic kidney disease, unspecified: Secondary | ICD-10-CM | POA: Diagnosis not present

## 2018-09-27 DIAGNOSIS — N179 Acute kidney failure, unspecified: Secondary | ICD-10-CM | POA: Diagnosis not present

## 2018-09-27 DIAGNOSIS — E1122 Type 2 diabetes mellitus with diabetic chronic kidney disease: Secondary | ICD-10-CM | POA: Diagnosis not present

## 2018-09-27 DIAGNOSIS — I739 Peripheral vascular disease, unspecified: Secondary | ICD-10-CM | POA: Diagnosis not present

## 2018-09-27 DIAGNOSIS — Z992 Dependence on renal dialysis: Secondary | ICD-10-CM | POA: Diagnosis not present

## 2018-09-28 ENCOUNTER — Telehealth: Payer: Self-pay

## 2018-09-28 DIAGNOSIS — N186 End stage renal disease: Secondary | ICD-10-CM | POA: Diagnosis not present

## 2018-09-28 DIAGNOSIS — Z992 Dependence on renal dialysis: Secondary | ICD-10-CM | POA: Diagnosis not present

## 2018-09-28 DIAGNOSIS — Z23 Encounter for immunization: Secondary | ICD-10-CM | POA: Diagnosis not present

## 2018-09-28 LAB — COMPREHENSIVE METABOLIC PANEL
ALT: 14 IU/L (ref 0–44)
AST: 22 IU/L (ref 0–40)
Albumin/Globulin Ratio: 0.9 — ABNORMAL LOW (ref 1.2–2.2)
Albumin: 3.2 g/dL — ABNORMAL LOW (ref 3.7–4.7)
Alkaline Phosphatase: 336 IU/L — ABNORMAL HIGH (ref 39–117)
BUN/Creatinine Ratio: 15 (ref 10–24)
BUN: 40 mg/dL — ABNORMAL HIGH (ref 8–27)
Bilirubin Total: 0.7 mg/dL (ref 0.0–1.2)
CO2: 24 mmol/L (ref 20–29)
Calcium: 8.6 mg/dL (ref 8.6–10.2)
Chloride: 94 mmol/L — ABNORMAL LOW (ref 96–106)
Creatinine, Ser: 2.66 mg/dL — ABNORMAL HIGH (ref 0.76–1.27)
GFR calc Af Amer: 26 mL/min/{1.73_m2} — ABNORMAL LOW (ref >59)
GFR calc non Af Amer: 23 mL/min/{1.73_m2} — ABNORMAL LOW (ref >59)
Globulin, Total: 3.4 g/dL (ref 1.5–4.5)
Glucose: 144 mg/dL — ABNORMAL HIGH (ref 65–99)
Potassium: 3.8 mmol/L (ref 3.5–5.2)
Sodium: 133 mmol/L — ABNORMAL LOW (ref 134–144)
Total Protein: 6.6 g/dL (ref 6.0–8.5)

## 2018-09-28 LAB — CBC
Hematocrit: 35.8 % — ABNORMAL LOW (ref 37.5–51.0)
Hemoglobin: 11.9 g/dL — ABNORMAL LOW (ref 13.0–17.7)
MCH: 30.8 pg (ref 26.6–33.0)
MCHC: 33.2 g/dL (ref 31.5–35.7)
MCV: 93 fL (ref 79–97)
Platelets: 260 10*3/uL (ref 150–450)
RBC: 3.86 x10E6/uL — ABNORMAL LOW (ref 4.14–5.80)
RDW: 15.3 % (ref 11.6–15.4)
WBC: 9.4 10*3/uL (ref 3.4–10.8)

## 2018-09-28 LAB — PSA: Prostate Specific Ag, Serum: 0.5 ng/mL (ref 0.0–4.0)

## 2018-09-28 LAB — LIPID PANEL
Chol/HDL Ratio: 2.9 ratio (ref 0.0–5.0)
Cholesterol, Total: 102 mg/dL (ref 100–199)
HDL: 35 mg/dL — ABNORMAL LOW (ref >39)
LDL Calculated: 47 mg/dL (ref 0–99)
Triglycerides: 100 mg/dL (ref 0–149)
VLDL Cholesterol Cal: 20 mg/dL (ref 5–40)

## 2018-09-28 LAB — HEMOGLOBIN A1C
Est. average glucose Bld gHb Est-mCnc: 180 mg/dL
Hgb A1c MFr Bld: 7.9 % — ABNORMAL HIGH (ref 4.8–5.6)

## 2018-09-28 NOTE — Telephone Encounter (Signed)
-----   Message from Birdie Sons, MD sent at 09/28/2018  8:22 AM EDT ----- a1c is up to 7.9. psa is normal. Rest of labs are stable. Increase daily dose of tresiba by 2 units. Continue all other medications. Schedule follow up for diabetes 3-4 months.

## 2018-09-28 NOTE — Telephone Encounter (Signed)
Patient has been advised. KW 

## 2018-10-01 ENCOUNTER — Other Ambulatory Visit (INDEPENDENT_AMBULATORY_CARE_PROVIDER_SITE_OTHER): Payer: Self-pay | Admitting: Vascular Surgery

## 2018-10-01 DIAGNOSIS — Z992 Dependence on renal dialysis: Secondary | ICD-10-CM | POA: Diagnosis not present

## 2018-10-01 DIAGNOSIS — N186 End stage renal disease: Secondary | ICD-10-CM | POA: Diagnosis not present

## 2018-10-01 DIAGNOSIS — T829XXD Unspecified complication of cardiac and vascular prosthetic device, implant and graft, subsequent encounter: Secondary | ICD-10-CM

## 2018-10-01 DIAGNOSIS — Z9862 Peripheral vascular angioplasty status: Secondary | ICD-10-CM

## 2018-10-01 DIAGNOSIS — Z23 Encounter for immunization: Secondary | ICD-10-CM | POA: Diagnosis not present

## 2018-10-02 ENCOUNTER — Ambulatory Visit (INDEPENDENT_AMBULATORY_CARE_PROVIDER_SITE_OTHER): Payer: Medicare Other

## 2018-10-02 ENCOUNTER — Encounter (INDEPENDENT_AMBULATORY_CARE_PROVIDER_SITE_OTHER): Payer: Self-pay | Admitting: Vascular Surgery

## 2018-10-02 ENCOUNTER — Other Ambulatory Visit: Payer: Self-pay

## 2018-10-02 ENCOUNTER — Ambulatory Visit (INDEPENDENT_AMBULATORY_CARE_PROVIDER_SITE_OTHER): Payer: Medicare Other | Admitting: Vascular Surgery

## 2018-10-02 VITALS — BP 135/73 | HR 77 | Resp 16 | Wt 160.4 lb

## 2018-10-02 DIAGNOSIS — I10 Essential (primary) hypertension: Secondary | ICD-10-CM

## 2018-10-02 DIAGNOSIS — E1122 Type 2 diabetes mellitus with diabetic chronic kidney disease: Secondary | ICD-10-CM

## 2018-10-02 DIAGNOSIS — Z992 Dependence on renal dialysis: Secondary | ICD-10-CM

## 2018-10-02 DIAGNOSIS — I739 Peripheral vascular disease, unspecified: Secondary | ICD-10-CM

## 2018-10-02 DIAGNOSIS — N186 End stage renal disease: Secondary | ICD-10-CM

## 2018-10-02 DIAGNOSIS — I120 Hypertensive chronic kidney disease with stage 5 chronic kidney disease or end stage renal disease: Secondary | ICD-10-CM | POA: Diagnosis not present

## 2018-10-02 DIAGNOSIS — Z79899 Other long term (current) drug therapy: Secondary | ICD-10-CM

## 2018-10-02 DIAGNOSIS — Z9862 Peripheral vascular angioplasty status: Secondary | ICD-10-CM

## 2018-10-02 DIAGNOSIS — T829XXD Unspecified complication of cardiac and vascular prosthetic device, implant and graft, subsequent encounter: Secondary | ICD-10-CM

## 2018-10-02 DIAGNOSIS — Z87891 Personal history of nicotine dependence: Secondary | ICD-10-CM

## 2018-10-02 NOTE — Progress Notes (Signed)
MRN : [id number]  Thomas [name] is a 73 y.o. (1945-06-18) male who presents with chief complaint of  Chief Complaint  Patient presents with   Follow-up    [facility] 1 month HDA  .  History of Present Illness: Patient returns today in follow up of his dialysis access.  A month or so ago, he came in and had an intervention to it.  Since that time, his access has been working well without any major issues.  No further prolonged bleeding, difficulties with access, or low flow rates.  Duplex today shows a patent left brachiocephalic AV fistula with no evidence of stenosis. He is also followed for PAD.  He has had some persistent swelling in the legs although this has not significantly worsened.  No ulceration or infection.  No disabling claudication symptoms at current.  Current Outpatient Medications  Medication Sig Dispense Refill   acetaminophen (TYLENOL) 500 MG tablet Take 500 mg by mouth every 8 (eight) hours as needed for mild pain or headache.     ALPRAZolam (XANAX) 0.25 MG tablet TAKE 1 TABLET BY MOUTH ONCE DAILY AS NEEDED FOR ANXIETY 20 tablet 5   aspirin EC 81 MG tablet Take 81 mg by mouth daily.     atorvastatin (LIPITOR) 80 MG tablet TAKE 1 TABLET BY MOUTH ONCE DAILY 30 tablet 5   B Complex-C-Folic Acid (RENA-VITE PO) Take by mouth daily.     Blood Glucose Monitoring Suppl (ONE TOUCH ULTRA 2) w/Device KIT Use to check gluocose daily for type 2 diabetes 1 each 0   calcium acetate (PHOSLO) 667 MG capsule TAKE 1 CAPSULE BY MOUTH 3 TIMES DAILY WITH MEALS 90 capsule 5   clopidogrel (PLAVIX) 75 MG tablet TAKE 1 TABLET BY MOUTH ONCE DAILY 30 tablet 2   glucose blood test strip Contour Ascensia test strips and lancets Use to check blood sugar daily for type 2 diabetes, E11.9. 100 each 4   insulin degludec (TRESIBA FLEXTOUCH) 100 UNIT/ML SOPN FlexTouch Pen Inject 0.06 mLs (6 Units total) into the skin daily. Increase by 2 units a day if fasting glucose is over 200 3 mL 0    isosorbide mononitrate (IMDUR) 60 MG 24 hr tablet TAKE 1 TABLET BY MOUTH ONCE DAILY 30 tablet 5   LORazepam (ATIVAN) 0.5 MG tablet Take 1 tablet (0.5 mg total) by mouth at bedtime as needed for sleep. 30 tablet 3   losartan (COZAAR) 25 MG tablet Take 1 tablet (25 mg total) by mouth every evening. 30 tablet 6   magnesium oxide (MAG-OX) 400 MG tablet Take 400 mg by mouth daily.     metoprolol succinate (TOPROL-XL) 50 MG 24 hr tablet TAKE 1 TABLET BY MOUTH ONCE DAILY 30 tablet 5   pantoprazole (PROTONIX) 40 MG tablet Take 1 tablet (40 mg total) by mouth daily. 30 tablet 12   polycarbophil (FIBERCON) 625 MG tablet Take 625 mg by mouth as needed.     sodium bicarbonate 650 MG tablet Take 650 mg by mouth 2 (two) times daily.     tamsulosin (FLOMAX) 0.4 MG CAPS capsule TAKE 1 CAPSULE BY MOUTH ONCE DAILY 30 MINUTES AFTER LARGEST MEAL. 30 capsule 5   torsemide (DEMADEX) 100 MG tablet Take 100 mg by mouth daily. Do not take on dialysis days     traMADol (ULTRAM) 50 MG tablet Take 1 tablet (50 mg total) by mouth every 6 (six) hours as needed (Pain). 28 tablet 3   traZODone (DESYREL) 50 MG tablet TAKE  1/2 TO 1 TABLET BY MOUTH AT BEDTIMEAS NEEDED SLEEP 30 tablet 5   No current facility-administered medications for this visit.     Past Medical History:  Diagnosis Date   Allergy    Arthritis    CAD (coronary artery disease)    Chronic kidney disease    Diabetes mellitus without complication (HCC)    Dyspnea    Erosive esophagitis    ESRD (end stage renal disease) on dialysis ([place]) 2019   MWF, Left arm fistula   GERD (gastroesophageal reflux disease)    Gouty arthropathy    History of colonic polyps    History of kidney stones    History of MRSA infection    Hyperkalemia    Hyperlipidemia    Hypertension    Melanoma ([place])    Myocardial infarction ([place]) 1986, 2019   Peripheral vascular disease ([place])    Squamous cell cancer of external ear, right    07/2016    Trigger finger of left hand    Ureteral tumor     Past Surgical History:  Procedure Laterality Date   A/V FISTULAGRAM Left 08/24/2018   Procedure: A/V FISTULAGRAM;  Surgeon: Katha Cabal, MD;  Location: [place] [facility];  Service: Cardiovascular;  Laterality: Left;   A/V SHUNT INTERVENTION Left 03/12/2018   Procedure: A/V SHUNT INTERVENTION;  Surgeon: Algernon Huxley, MD;  Location: [place] [facility];  Service: Cardiovascular;  Laterality: Left;   AV FISTULA PLACEMENT Left 12/07/2017   Procedure: ARTERIOVENOUS (AV) FISTULA CREATION ( BRACHIOBASILIC STAGE );  Surgeon: Algernon Huxley, MD;  Location: [facility];  Service: Vascular;  Laterality: Left;   CATARACT EXTRACTION     CORONARY ARTERY BYPASS GRAFT  1996   CORONARY STENT INTERVENTION N/A 05/29/2017   Procedure: CORONARY STENT INTERVENTION;  Surgeon: Isaias Cowman, MD;  Location: [place] [facility];  Service: Cardiovascular;  Laterality: N/A;   DIALYSIS/PERMA CATHETER REMOVAL N/A 04/12/2018   Procedure: DIALYSIS/PERMA CATHETER REMOVAL;  Surgeon: Algernon Huxley, MD;  Location: [place] [facility];  Service: Cardiovascular;  Laterality: N/A;   EXCISION Ureteral Tumor  2010?   (benign) Dr. Quillian Quince   EYE SURGERY Bilateral    Cataract Extraction with IOL   LEFT HEART CATH AND CORONARY ANGIOGRAPHY N/A 10/18/2016   Procedure: Left Heart Cath and Coronary Angiography;  Surgeon: Isaias Cowman, MD;  Location: [place] [facility];  Service: Cardiovascular;  Laterality: N/A;   LEFT HEART CATH AND CORONARY ANGIOGRAPHY N/A 05/29/2017   Procedure: LEFT HEART CATH AND CORONARY ANGIOGRAPHY;  Surgeon: Isaias Cowman, MD;  Location: [place] [facility];  Service: Cardiovascular;  Laterality: N/A;   LOWER EXTREMITY ANGIOGRAPHY Right 10/31/2016   Procedure: Lower Extremity Angiography;  Surgeon: Algernon Huxley, MD;  Location: [place] [facility];  Service: Cardiovascular;  Laterality: Right;   LOWER EXTREMITY ANGIOGRAPHY  Left 11/14/2016   Procedure: Lower Extremity Angiography;  Surgeon: Algernon Huxley, MD;  Location: [place] [facility];  Service: Cardiovascular;  Laterality: Left;   LOWER EXTREMITY INTERVENTION  11/14/2016   Procedure: Lower Extremity Intervention;  Surgeon: Algernon Huxley, MD;  Location: [place] [facility];  Service: Cardiovascular;;   open heart surgery     RETINAL DETACHMENT SURGERY Right November 2107    Social History Social History   Tobacco Use   Smoking status: Former Smoker    Packs/day: 1.00    Years: 51.00    Pack years: 51.00    Types: Cigarettes  Quit date: 06/21/2017    Years since quitting: 1.2   Smokeless tobacco: Never Used   Tobacco comment: started smoking at age [age]. Smoked for 50 years  Substance Use Topics   Alcohol use: Not Currently    Alcohol/week: 0.0 standard drinks    Frequency: Never   Drug use: No    Family History Family History  Problem Relation Age of Onset   Alzheimer's disease Mother    Alzheimer's disease Brother    Lung cancer Paternal Grandfather     Allergies  Allergen Reactions   Ciprofloxacin Itching and Swelling    Facial swelling    Hydralazine Nausea And Vomiting   Trulicity [Dulaglutide]     Bloating   REVIEW OF SYSTEMS(Negative unless checked)  Constitutional: _0 ?Weight loss_1 ?Fever_2 ?Chills Cardiac:_3 ?Chest pain_4 ?Chest pressure_5 ?Palpitations _6 ?Shortness of breath when laying flat _7 ?Shortness of breath at rest _8 ?Shortness of breath with exertion. Vascular: _9 ?Pain in legs with walking_10 ?Pain in legsat rest_11 ?Pain in legs when laying flat _12 ?Claudication _13 ?Pain in feet when walking _14 ?Pain in feet at rest _15 ?Pain in feet when laying flat _16 ?History of DVT _17 ?Phlebitis _18 ?Swelling in legs _19 ?Varicose veins _20 ?Non-healing ulcers Pulmonary: _21 ?Uses home oxygen _22 ?Productive cough_23 ?Hemoptysis _24 ?Wheeze _25 ?COPD _26 ?Asthma Neurologic: _27 ?Dizziness  _28 ?Blackouts _29 ?Seizures _30 ?History of stroke _31 ?History of TIA_32 ?Aphasia _33 ?Temporary blindness_34 ?Dysphagia _35 ?Weaknessor numbness in arms _36 ?Weakness or numbnessin legs Musculoskeletal: _37 ?Arthritis _38 ?Joint swelling _39 ?Joint pain _40 ?Low back pain Hematologic:_41 ?Easy bruising_42 ?Easy bleeding _43 ?Hypercoagulable state _44 ?Anemic  Gastrointestinal:_45 ?Blood in stool_46 ?Vomiting blood_47 ?Gastroesophageal reflux/heartburn_48 ?Abdominal pain Genitourinary: _49 ?Chronic kidney disease _50 ?Difficulturination _51 ?Frequenturination _52 ?Burning with urination_53 ?Hematuria Skin: _54 ?Rashes _55 ?Ulcers _56 ?Wounds Psychological: _57 ?History of anxiety_58 ?History of major depression.   Physical Examination  BP 135/73 (BP Location: Right Arm)    Pulse 77    Resp 16    Wt 160 lb 6.4 oz (72.8 kg)    BMI 25.89 kg/m  Gen:  WD/WN, NAD Head: [place]/AT, No temporalis wasting. Ear/Nose/Throat: Hearing grossly intact, nares w/o erythema or drainage Eyes: Conjunctiva clear. Sclera non-icteric Neck: Supple.  Trachea midline Pulmonary:  Good air movement, no use of accessory muscles.  Cardiac: RRR, no JVD Vascular: good thrill in left arm AVF Vessel Right Left  Radial Palpable Palpable                          PT Trace Palpable Trace Palpable  DP 1+ Palpable 1+ Palpable    Musculoskeletal: M/S 5/5 throughout.  No deformity or atrophy. 1-2+ BLE edema. Neurologic: Sensation grossly intact in extremities.  Symmetrical.  Speech is fluent.  Psychiatric: Judgment intact, Mood & affect appropriate for pt's clinical situation. Dermatologic: No rashes or ulcers noted.  No cellulitis or open wounds.       Labs Recent Results (from the past 2160 hour(s))  Potassium [facility] vascular lab only)     Status: None   Collection Time: 08/24/18  2:05 PM  Result Value Ref Range   Potassium [facility] vascular lab) 4.1 3.5 - 5.1    Comment: Performed at [facility], [address] [place], [place] [address]  Glucose, capillary     Status: Abnormal   Collection Time: 08/24/18  2:10 PM  Result Value Ref Range   Glucose-Capillary 116 (H) 70 - 99 mg/dL  Glucose, capillary     Status: Abnormal   Collection Time: 08/24/18  3:44 PM  Result Value Ref Range   Glucose-Capillary 121 (H) 70 - 99 mg/dL  Comprehensive metabolic panel     Status: Abnormal   Collection Time: 09/27/18  9:55 AM  Result Value Ref Range  Glucose 144 (H) 65 - 99 mg/dL   BUN 40 (H) 8 - 27 mg/dL   Creatinine, Ser 2.66 (H) 0.76 - 1.27 mg/dL   GFR calc non Af Amer 23 (L) >59 mL/min/1.73   GFR calc Af Amer 26 (L) >59 mL/min/1.73   BUN/Creatinine Ratio 15 10 - 24   Sodium 133 (L) 134 - 144 mmol/L   Potassium 3.8 3.5 - 5.2 mmol/L   Chloride 94 (L) 96 - 106 mmol/L   CO2 24 20 - 29 mmol/L   Calcium 8.6 8.6 - 10.2 mg/dL   Total Protein 6.6 6.0 - 8.5 g/dL   Albumin 3.2 (L) 3.7 - 4.7 g/dL   Globulin, Total 3.4 1.5 - 4.5 g/dL   Albumin/Globulin Ratio 0.9 (L) 1.2 - 2.2   Bilirubin Total 0.7 0.0 - 1.2 mg/dL   Alkaline Phosphatase 336 (H) 39 - 117 IU/L   AST 22 0 - 40 IU/L   ALT 14 0 - 44 IU/L  Lipid panel     Status: Abnormal   Collection Time: 09/27/18  9:55 AM  Result Value Ref Range   Cholesterol, Total 102 100 - 199 mg/dL   Triglycerides 100 0 - 149 mg/dL   HDL 35 (L) >39 mg/dL   VLDL Cholesterol Cal 20 5 - 40 mg/dL   LDL Calculated 47 0 - 99 mg/dL   Chol/HDL Ratio 2.9 0.0 - 5.0 ratio    Comment:                                   T. Chol/HDL Ratio                                             Men  Women                               1/2 Avg.Risk  3.4    3.3                                   Avg.Risk  5.0    4.4                                2X Avg.Risk  9.6    7.1                                3X Avg.Risk 23.4   11.0   PSA     Status: None   Collection Time: 09/27/18  9:55 AM  Result Value Ref Range   Prostate Specific Ag, Serum 0.5 0.0 - 4.0 ng/mL    Comment: Roche  ECLIA methodology. According to the American Urological Association, Serum PSA should decrease and remain at undetectable levels after radical prostatectomy. The AUA defines biochemical recurrence as an initial PSA value 0.2 ng/mL or greater followed by a subsequent confirmatory PSA value 0.2 ng/mL or greater. Values obtained with different assay methods or kits cannot be used interchangeably. Results cannot be interpreted as absolute evidence of the presence or absence of malignant disease.   Hemoglobin A1c  Status: Abnormal   Collection Time: 09/27/18  9:55 AM  Result Value Ref Range   Hgb A1c MFr Bld 7.9 (H) 4.8 - 5.6 %    Comment:          Prediabetes: 5.7 - 6.4          Diabetes: >6.4          Glycemic control for adults with diabetes: <7.0    Est. average glucose Bld gHb Est-mCnc 180 mg/dL  CBC     Status: Abnormal   Collection Time: 09/27/18  9:55 AM  Result Value Ref Range   WBC 9.4 3.4 - 10.8 x10E3/uL   RBC 3.86 (L) 4.14 - 5.80 x10E6/uL   Hemoglobin 11.9 (L) 13.0 - 17.7 g/dL   Hematocrit 35.8 (L) 37.5 - 51.0 %   MCV 93 79 - 97 fL   MCH 30.8 26.6 - 33.0 pg   MCHC 33.2 31.5 - 35.7 g/dL   RDW 15.3 11.6 - 15.4 %   Platelets 260 150 - 450 x10E3/uL    Radiology Dg Esophagus W Double Cm (hd)  Result Date: 09/18/2018 CLINICAL DATA:  Dysphagia for months EXAM: ESOPHOGRAM / BARIUM SWALLOW / BARIUM TABLET STUDY TECHNIQUE: Combined double contrast and single contrast examination performed using effervescent crystals, thick barium liquid, and thin barium liquid. The patient was observed with fluoroscopy swallowing a 13 mm barium sulphate tablet. FLUOROSCOPY TIME:  Fluoroscopy Time:  36 seconds Radiation Exposure Index (if provided by the fluoroscopic device): 5.7 mGy Number of Acquired Spot Images: 0 COMPARISON:  None. FINDINGS: There was normal pharyngeal anatomy and motility. Contrast flowed freely through the esophagus without evidence of stricture or mass. There was normal  esophageal mucosa without evidence of irregularity or ulceration. Esophageal motility was normal. No evidence of reflux. No definite hiatal hernia was demonstrated. At the end of the examination a 13 mm barium tablet was administered which transited through the esophagus and esophagogastric junction without delay. IMPRESSION: Normal barium swallow. Electronically Signed   By: Kathreen Devoid   On: 09/18/2018 08:37    Assessment/Plan  Essential (primary) hypertension blood pressure control important in reducing the progression of atherosclerotic disease. On appropriate oral medications.   PAD (peripheral artery disease) (HCC) No recent worrisome symptoms.  Has undergone previous intervention to both lower extremities.  Plan to return in 2 to 3 months with noninvasive studies for follow-up.  DM (diabetes mellitus), type 2 with renal complications (HCC) blood glucose control important in reducing the progression of atherosclerotic disease. Also, involved in wound healing. On appropriate medications.   ESRD on dialysis [facility]) Duplex shows a widely patent left brachiocephalic AV fistula without current stenosis.  This is markedly improved after intervention last month.  Continue to use the access for dialysis.  Plan to recheck in 6 months with noninvasive studies.    Leotis Pain, MD  10/02/2018 4:23 PM    This note was created with Dragon medical transcription system.  Any errors from dictation are purely unintentional

## 2018-10-02 NOTE — Assessment & Plan Note (Signed)
No recent worrisome symptoms.  Has undergone previous intervention to both lower extremities.  Plan to return in 2 to 3 months with noninvasive studies for follow-up.

## 2018-10-02 NOTE — Assessment & Plan Note (Signed)
blood pressure control important in reducing the progression of atherosclerotic disease. On appropriate oral medications.  

## 2018-10-02 NOTE — Assessment & Plan Note (Signed)
blood glucose control important in reducing the progression of atherosclerotic disease. Also, involved in wound healing. On appropriate medications.  

## 2018-10-02 NOTE — Assessment & Plan Note (Signed)
Duplex shows a widely patent left brachiocephalic AV fistula without current stenosis.  This is markedly improved after intervention last month.  Continue to use the access for dialysis.  Plan to recheck in 6 months with noninvasive studies.

## 2018-10-03 ENCOUNTER — Telehealth: Payer: Self-pay | Admitting: Family Medicine

## 2018-10-03 DIAGNOSIS — N186 End stage renal disease: Secondary | ICD-10-CM | POA: Diagnosis not present

## 2018-10-03 DIAGNOSIS — Z992 Dependence on renal dialysis: Secondary | ICD-10-CM | POA: Diagnosis not present

## 2018-10-03 DIAGNOSIS — Z23 Encounter for immunization: Secondary | ICD-10-CM | POA: Diagnosis not present

## 2018-10-03 NOTE — Chronic Care Management (AMB) (Signed)
Chronic Care Management   Note  10/03/2018 Name: [name] MRN: [id number] DOB: 04-Mar-1946  Thomas [name] is a 73 y.o. year old male who is a primary care patient of Caryn Section, Kirstie Peri, MD. I reached out to [name] by phone today in response to a referral sent by [name] health plan.    [name] was given information about Chronic Care Management services today including:  1. CCM service includes personalized support from designated clinical staff supervised by his physician, including individualized plan of care and coordination with other care providers 2. 24/7 contact phone numbers for assistance for urgent and routine care needs. 3. Service will only be billed when office clinical staff spend 20 minutes or more in a month to coordinate care. 4. Only one practitioner may furnish and bill the service in a calendar month. 5. The patient may stop CCM services at any time (effective at the end of the month) by phone call to the office staff. 6. The patient will be responsible for cost sharing (co-pay) of up to 20% of the service fee (after annual deductible is met).  Patient agreed to services and verbal consent obtained.   Follow up plan: Telephone appointment with CCM team member scheduled for: 10/11/2018  [place]  [email or web address]   ??[phone]

## 2018-10-05 DIAGNOSIS — N186 End stage renal disease: Secondary | ICD-10-CM | POA: Diagnosis not present

## 2018-10-05 DIAGNOSIS — Z23 Encounter for immunization: Secondary | ICD-10-CM | POA: Diagnosis not present

## 2018-10-05 DIAGNOSIS — Z992 Dependence on renal dialysis: Secondary | ICD-10-CM | POA: Diagnosis not present

## 2018-10-08 DIAGNOSIS — Z992 Dependence on renal dialysis: Secondary | ICD-10-CM | POA: Diagnosis not present

## 2018-10-08 DIAGNOSIS — Z23 Encounter for immunization: Secondary | ICD-10-CM | POA: Diagnosis not present

## 2018-10-08 DIAGNOSIS — N186 End stage renal disease: Secondary | ICD-10-CM | POA: Diagnosis not present

## 2018-10-09 DIAGNOSIS — I5023 Acute on chronic systolic (congestive) heart failure: Secondary | ICD-10-CM | POA: Diagnosis not present

## 2018-10-09 DIAGNOSIS — I10 Essential (primary) hypertension: Secondary | ICD-10-CM | POA: Diagnosis not present

## 2018-10-09 DIAGNOSIS — I739 Peripheral vascular disease, unspecified: Secondary | ICD-10-CM | POA: Diagnosis not present

## 2018-10-09 DIAGNOSIS — I25810 Atherosclerosis of coronary artery bypass graft(s) without angina pectoris: Secondary | ICD-10-CM | POA: Diagnosis not present

## 2018-10-09 DIAGNOSIS — I214 Non-ST elevation (NSTEMI) myocardial infarction: Secondary | ICD-10-CM | POA: Diagnosis not present

## 2018-10-10 DIAGNOSIS — N186 End stage renal disease: Secondary | ICD-10-CM | POA: Diagnosis not present

## 2018-10-10 DIAGNOSIS — Z23 Encounter for immunization: Secondary | ICD-10-CM | POA: Diagnosis not present

## 2018-10-10 DIAGNOSIS — Z992 Dependence on renal dialysis: Secondary | ICD-10-CM | POA: Diagnosis not present

## 2018-10-11 ENCOUNTER — Ambulatory Visit (INDEPENDENT_AMBULATORY_CARE_PROVIDER_SITE_OTHER): Payer: Medicare Other | Admitting: Pharmacist

## 2018-10-11 DIAGNOSIS — N183 Chronic kidney disease, stage 3 unspecified: Secondary | ICD-10-CM

## 2018-10-11 DIAGNOSIS — E1122 Type 2 diabetes mellitus with diabetic chronic kidney disease: Secondary | ICD-10-CM | POA: Diagnosis not present

## 2018-10-11 DIAGNOSIS — N185 Chronic kidney disease, stage 5: Secondary | ICD-10-CM

## 2018-10-11 DIAGNOSIS — I5023 Acute on chronic systolic (congestive) heart failure: Secondary | ICD-10-CM | POA: Diagnosis not present

## 2018-10-12 DIAGNOSIS — Z992 Dependence on renal dialysis: Secondary | ICD-10-CM | POA: Diagnosis not present

## 2018-10-12 DIAGNOSIS — Z23 Encounter for immunization: Secondary | ICD-10-CM | POA: Diagnosis not present

## 2018-10-12 DIAGNOSIS — N186 End stage renal disease: Secondary | ICD-10-CM | POA: Diagnosis not present

## 2018-10-12 NOTE — Patient Instructions (Signed)
Thank you for taking the time to speak with me today!   It is important to know what medications you are taking, especially if you feel you are taking too many. If you feel comfortable, you (or your daughter) can email me your medication list to my secure email: [email or web address] with [email or web address] in the subject line. I can review your medication list and make a personalized medication guide for you.   I will call you in 1 week.   Goals Addressed            This Visit's Progress   . Medication Optimization (pt-stated)       Current Barriers:  . Patient unsure of medication regimen  Pharmacist Clinical Goal(s):  Marland Kitchen Over the next 30 days, patient will verbalize understanding of medications related to plan of care.   Interventions: . Comprehensive medication review . Collaboration with providers if any deprescirbing is necessary . Provide personalized medication guide  Patient Self Care Activities:  . Self administers medications as prescribed  Initial goal documentation        The patient verbalized understanding of instructions provided today and declined a print copy of patient instruction materials.

## 2018-10-12 NOTE — Chronic Care Management (AMB) (Signed)
  Chronic Care Management   Follow Up Note   10/12/2018 Name: Thomas [name] MRN: [id number] DOB: 14-Feb-1946  Subjective Thomas [name] is a 73 y.o. year old male who is a primary care patient of Fisher, Kirstie Peri, MD. The CCM clinical pharmacist was consulted by patient's health plan. HIPAA identifiers verified.  Assessment Patient states that he does not know his medications well. His daughter takes care of his medications for him. He will email pharmacist a list to secure email [name]) with secure in subject line.   Goals Addressed            This Visit's Progress   . Medication Optimization (pt-stated)       Current Barriers:  . Patient unsure of medication regimen  Pharmacist Clinical Goal(s):  Marland Kitchen Over the next 30 days, patient will verbalize understanding of medications related to plan of care.   Interventions: . Comprehensive medication review . Collaboration with providers if any deprescirbing is necessary . Provide personalized medication guide  Patient Self Care Activities:  . Self administers medications as prescribed  Initial goal documentation         Telephone follow up appointment with care management team member scheduled for: 1 week with PharmD   Thomas [name], PharmD Clinical Pharmacist [place] [phone]

## 2018-10-15 DIAGNOSIS — N186 End stage renal disease: Secondary | ICD-10-CM | POA: Diagnosis not present

## 2018-10-15 DIAGNOSIS — Z992 Dependence on renal dialysis: Secondary | ICD-10-CM | POA: Diagnosis not present

## 2018-10-15 DIAGNOSIS — Z23 Encounter for immunization: Secondary | ICD-10-CM | POA: Diagnosis not present

## 2018-10-16 DIAGNOSIS — L821 Other seborrheic keratosis: Secondary | ICD-10-CM | POA: Diagnosis not present

## 2018-10-16 DIAGNOSIS — N186 End stage renal disease: Secondary | ICD-10-CM | POA: Diagnosis not present

## 2018-10-16 DIAGNOSIS — L578 Other skin changes due to chronic exposure to nonionizing radiation: Secondary | ICD-10-CM | POA: Diagnosis not present

## 2018-10-16 DIAGNOSIS — Z859 Personal history of malignant neoplasm, unspecified: Secondary | ICD-10-CM | POA: Diagnosis not present

## 2018-10-16 DIAGNOSIS — L720 Epidermal cyst: Secondary | ICD-10-CM | POA: Diagnosis not present

## 2018-10-16 DIAGNOSIS — Z872 Personal history of diseases of the skin and subcutaneous tissue: Secondary | ICD-10-CM | POA: Diagnosis not present

## 2018-10-16 DIAGNOSIS — L570 Actinic keratosis: Secondary | ICD-10-CM | POA: Diagnosis not present

## 2018-10-16 DIAGNOSIS — Z992 Dependence on renal dialysis: Secondary | ICD-10-CM | POA: Diagnosis not present

## 2018-10-17 ENCOUNTER — Other Ambulatory Visit (INDEPENDENT_AMBULATORY_CARE_PROVIDER_SITE_OTHER): Payer: Self-pay | Admitting: Vascular Surgery

## 2018-10-17 DIAGNOSIS — N186 End stage renal disease: Secondary | ICD-10-CM | POA: Diagnosis not present

## 2018-10-17 DIAGNOSIS — Z992 Dependence on renal dialysis: Secondary | ICD-10-CM | POA: Diagnosis not present

## 2018-10-17 NOTE — Telephone Encounter (Signed)
Please advise refill? 

## 2018-10-17 NOTE — Telephone Encounter (Signed)
It looks like he received these post procedure. So no refill

## 2018-10-18 ENCOUNTER — Telehealth: Payer: Self-pay | Admitting: *Deleted

## 2018-10-18 ENCOUNTER — Ambulatory Visit: Payer: Self-pay | Admitting: Pharmacist

## 2018-10-18 DIAGNOSIS — Z87891 Personal history of nicotine dependence: Secondary | ICD-10-CM

## 2018-10-18 DIAGNOSIS — Z122 Encounter for screening for malignant neoplasm of respiratory organs: Secondary | ICD-10-CM

## 2018-10-18 DIAGNOSIS — N185 Chronic kidney disease, stage 5: Secondary | ICD-10-CM

## 2018-10-18 DIAGNOSIS — E1122 Type 2 diabetes mellitus with diabetic chronic kidney disease: Secondary | ICD-10-CM

## 2018-10-18 NOTE — Telephone Encounter (Signed)
Patient has been notified that annual lung cancer screening low dose CT scan is due currently or will be in near future. Confirmed that patient is within the age range of 55-77, and asymptomatic, (no signs or symptoms of lung cancer). Patient denies illness that would prevent curative treatment for lung cancer if found. Verified smoking history, (former, quit 3/19, 38.25 pack year). The shared decision making visit was done 06/14/16. Patient is agreeable for CT scan being scheduled.

## 2018-10-19 DIAGNOSIS — N186 End stage renal disease: Secondary | ICD-10-CM | POA: Diagnosis not present

## 2018-10-19 DIAGNOSIS — Z992 Dependence on renal dialysis: Secondary | ICD-10-CM | POA: Diagnosis not present

## 2018-10-19 NOTE — Patient Instructions (Signed)
Please call a member of the CCM (Chronic Care Management) Team with any questions or case management needs:   Vanetta Mulders, BSN Nurse Care Coordinator  [phone]  Ruben Reason, PharmD  Clinical Pharmacist  [phone]  Elliot Gurney, [place] Clinical Social Worker [phone]

## 2018-10-19 NOTE — Chronic Care Management (AMB) (Signed)
  Chronic Care Management   Note  10/19/2018 Name: Thomas [name] MRN: [id number] DOB: [birth year]  73 y.o. year old male referred to Chronic Care Management by health plan. Patient consented to services 10/03/18.   CCM team services are being closed by patient request. Patient was contacted by [facility] pharmacist because he did not provide medication list as stated last week. He declines interest in the program at this time.    Patient has been provided CCM contact information if he/she wishes to engage with care managers in the future.   Ruben Reason, PharmD Clinical Pharmacist [place] [phone]

## 2018-10-22 DIAGNOSIS — Z992 Dependence on renal dialysis: Secondary | ICD-10-CM | POA: Diagnosis not present

## 2018-10-22 DIAGNOSIS — N186 End stage renal disease: Secondary | ICD-10-CM | POA: Diagnosis not present

## 2018-10-24 ENCOUNTER — Other Ambulatory Visit: Payer: Self-pay | Admitting: Family Medicine

## 2018-10-24 DIAGNOSIS — Z992 Dependence on renal dialysis: Secondary | ICD-10-CM | POA: Diagnosis not present

## 2018-10-24 DIAGNOSIS — N186 End stage renal disease: Secondary | ICD-10-CM | POA: Diagnosis not present

## 2018-10-24 MED ORDER — TRAMADOL HCL 50 MG PO TABS: 50.0000 mg | ORAL_TABLET | Freq: Four times a day (QID) | ORAL | 3 refills | Status: DC | PRN

## 2018-10-24 NOTE — Telephone Encounter (Signed)
[facility] faxed refill request for the following medications:  traMADol (ULTRAM) 50 MG tablet   Please advise.

## 2018-10-24 NOTE — Addendum Note (Signed)
Addended by: Birdie Sons on: 10/24/2018 02:01 PM   Modules accepted: Orders

## 2018-10-26 DIAGNOSIS — N186 End stage renal disease: Secondary | ICD-10-CM | POA: Diagnosis not present

## 2018-10-26 DIAGNOSIS — Z992 Dependence on renal dialysis: Secondary | ICD-10-CM | POA: Diagnosis not present

## 2018-10-29 ENCOUNTER — Ambulatory Visit: Payer: Medicare Other

## 2018-10-29 DIAGNOSIS — N186 End stage renal disease: Secondary | ICD-10-CM | POA: Diagnosis not present

## 2018-10-29 DIAGNOSIS — Z992 Dependence on renal dialysis: Secondary | ICD-10-CM | POA: Diagnosis not present

## 2018-10-29 DIAGNOSIS — N2581 Secondary hyperparathyroidism of renal origin: Secondary | ICD-10-CM | POA: Diagnosis not present

## 2018-10-30 ENCOUNTER — Other Ambulatory Visit: Payer: Self-pay

## 2018-10-30 ENCOUNTER — Ambulatory Visit
Admission: RE | Admit: 2018-10-30 | Discharge: 2018-10-30 | Disposition: A | Discharge status: Home / Self Care | Payer: Medicare Other | Source: Ambulatory Visit | Attending: Oncology | Admitting: Oncology

## 2018-10-30 DIAGNOSIS — Z87891 Personal history of nicotine dependence: Secondary | ICD-10-CM | POA: Insufficient documentation

## 2018-10-30 DIAGNOSIS — Z122 Encounter for screening for malignant neoplasm of respiratory organs: Secondary | ICD-10-CM | POA: Insufficient documentation

## 2018-10-31 DIAGNOSIS — Z992 Dependence on renal dialysis: Secondary | ICD-10-CM | POA: Diagnosis not present

## 2018-10-31 DIAGNOSIS — N186 End stage renal disease: Secondary | ICD-10-CM | POA: Diagnosis not present

## 2018-11-01 ENCOUNTER — Telehealth: Payer: Self-pay | Admitting: *Deleted

## 2018-11-01 NOTE — Telephone Encounter (Signed)
Notified patient of LDCT lung cancer screening program results with recommendation for 6 month follow up imaging. Also notified of incidental findings noted below and is encouraged to discuss further with PCP who will receive a copy of this note and/or the CT report. Patient verbalizes understanding.   IMPRESSION: 1. Multiple new scattered tiny bilateral pulmonary nodules, measuring up to 5.4 mm. Lung-RADS 3, probably benign findings, but correlation for primary cancer history recommended. Short-term follow-up in 6 months is recommended with repeat low-dose chest CT without contrast (please use the following order, "CT CHEST LCS NODULE FOLLOW-UP W/O CM"). 2. Irregular liver contour raises the question of cirrhosis. 3. Ascites. 4. Small right pleural effusion. 5.  Emphysema. (ICD10-J43.9) 6.  Aortic Atherosclerois (ICD10-170.0)

## 2018-11-02 ENCOUNTER — Other Ambulatory Visit
Admission: RE | Admit: 2018-11-02 | Discharge: 2018-11-02 | Disposition: A | Discharge status: Home / Self Care | Payer: Medicare Other | Source: Ambulatory Visit | Attending: Gastroenterology | Admitting: Gastroenterology

## 2018-11-02 ENCOUNTER — Other Ambulatory Visit: Payer: Self-pay

## 2018-11-02 DIAGNOSIS — Z992 Dependence on renal dialysis: Secondary | ICD-10-CM | POA: Diagnosis not present

## 2018-11-02 DIAGNOSIS — Z1159 Encounter for screening for other viral diseases: Secondary | ICD-10-CM | POA: Diagnosis not present

## 2018-11-02 DIAGNOSIS — N186 End stage renal disease: Secondary | ICD-10-CM | POA: Diagnosis not present

## 2018-11-02 LAB — SARS CORONAVIRUS 2 (TAT 6-24 HRS): SARS Coronavirus 2: NEGATIVE

## 2018-11-05 ENCOUNTER — Encounter: Payer: Self-pay | Admitting: *Deleted

## 2018-11-05 DIAGNOSIS — N186 End stage renal disease: Secondary | ICD-10-CM | POA: Diagnosis not present

## 2018-11-05 DIAGNOSIS — Z992 Dependence on renal dialysis: Secondary | ICD-10-CM | POA: Diagnosis not present

## 2018-11-06 ENCOUNTER — Other Ambulatory Visit: Payer: Self-pay

## 2018-11-06 ENCOUNTER — Encounter
Admission: RE | Disposition: A | Discharge status: Home / Self Care | Payer: Self-pay | Source: Home / Self Care | Attending: Gastroenterology

## 2018-11-06 ENCOUNTER — Ambulatory Visit
Admission: RE | Admit: 2018-11-06 | Discharge: 2018-11-06 | Disposition: A | Discharge status: Home / Self Care | Payer: Medicare Other | Attending: Gastroenterology | Admitting: Gastroenterology

## 2018-11-06 ENCOUNTER — Ambulatory Visit: Payer: Medicare Other | Admitting: Anesthesiology

## 2018-11-06 ENCOUNTER — Encounter: Payer: Self-pay | Admitting: Anesthesiology

## 2018-11-06 ENCOUNTER — Telehealth: Payer: Self-pay | Admitting: Family Medicine

## 2018-11-06 DIAGNOSIS — D122 Benign neoplasm of ascending colon: Secondary | ICD-10-CM | POA: Insufficient documentation

## 2018-11-06 DIAGNOSIS — K449 Diaphragmatic hernia without obstruction or gangrene: Secondary | ICD-10-CM | POA: Insufficient documentation

## 2018-11-06 DIAGNOSIS — K3189 Other diseases of stomach and duodenum: Secondary | ICD-10-CM | POA: Diagnosis not present

## 2018-11-06 DIAGNOSIS — N186 End stage renal disease: Secondary | ICD-10-CM | POA: Insufficient documentation

## 2018-11-06 DIAGNOSIS — K2970 Gastritis, unspecified, without bleeding: Secondary | ICD-10-CM | POA: Diagnosis not present

## 2018-11-06 DIAGNOSIS — K641 Second degree hemorrhoids: Secondary | ICD-10-CM | POA: Diagnosis not present

## 2018-11-06 DIAGNOSIS — D124 Benign neoplasm of descending colon: Secondary | ICD-10-CM | POA: Insufficient documentation

## 2018-11-06 DIAGNOSIS — R932 Abnormal findings on diagnostic imaging of liver and biliary tract: Secondary | ICD-10-CM

## 2018-11-06 DIAGNOSIS — E1122 Type 2 diabetes mellitus with diabetic chronic kidney disease: Secondary | ICD-10-CM | POA: Diagnosis not present

## 2018-11-06 DIAGNOSIS — Z7982 Long term (current) use of aspirin: Secondary | ICD-10-CM | POA: Insufficient documentation

## 2018-11-06 DIAGNOSIS — Z7902 Long term (current) use of antithrombotics/antiplatelets: Secondary | ICD-10-CM | POA: Insufficient documentation

## 2018-11-06 DIAGNOSIS — J449 Chronic obstructive pulmonary disease, unspecified: Secondary | ICD-10-CM | POA: Diagnosis not present

## 2018-11-06 DIAGNOSIS — Z86010 Personal history of colonic polyps: Secondary | ICD-10-CM | POA: Diagnosis not present

## 2018-11-06 DIAGNOSIS — K635 Polyp of colon: Secondary | ICD-10-CM | POA: Diagnosis not present

## 2018-11-06 DIAGNOSIS — Z79899 Other long term (current) drug therapy: Secondary | ICD-10-CM | POA: Insufficient documentation

## 2018-11-06 DIAGNOSIS — K224 Dyskinesia of esophagus: Secondary | ICD-10-CM | POA: Diagnosis not present

## 2018-11-06 DIAGNOSIS — R1310 Dysphagia, unspecified: Secondary | ICD-10-CM | POA: Insufficient documentation

## 2018-11-06 DIAGNOSIS — K208 Other esophagitis: Secondary | ICD-10-CM | POA: Diagnosis not present

## 2018-11-06 DIAGNOSIS — K2270 Barrett's esophagus without dysplasia: Secondary | ICD-10-CM | POA: Diagnosis not present

## 2018-11-06 DIAGNOSIS — K644 Residual hemorrhoidal skin tags: Secondary | ICD-10-CM | POA: Insufficient documentation

## 2018-11-06 DIAGNOSIS — K2960 Other gastritis without bleeding: Secondary | ICD-10-CM | POA: Diagnosis not present

## 2018-11-06 DIAGNOSIS — F419 Anxiety disorder, unspecified: Secondary | ICD-10-CM | POA: Insufficient documentation

## 2018-11-06 DIAGNOSIS — E1151 Type 2 diabetes mellitus with diabetic peripheral angiopathy without gangrene: Secondary | ICD-10-CM | POA: Insufficient documentation

## 2018-11-06 DIAGNOSIS — Z87891 Personal history of nicotine dependence: Secondary | ICD-10-CM | POA: Insufficient documentation

## 2018-11-06 DIAGNOSIS — Z794 Long term (current) use of insulin: Secondary | ICD-10-CM | POA: Insufficient documentation

## 2018-11-06 DIAGNOSIS — I509 Heart failure, unspecified: Secondary | ICD-10-CM | POA: Insufficient documentation

## 2018-11-06 DIAGNOSIS — K228 Other specified diseases of esophagus: Secondary | ICD-10-CM | POA: Diagnosis not present

## 2018-11-06 DIAGNOSIS — K319 Disease of stomach and duodenum, unspecified: Secondary | ICD-10-CM | POA: Diagnosis not present

## 2018-11-06 DIAGNOSIS — M1990 Unspecified osteoarthritis, unspecified site: Secondary | ICD-10-CM | POA: Insufficient documentation

## 2018-11-06 DIAGNOSIS — D123 Benign neoplasm of transverse colon: Secondary | ICD-10-CM | POA: Diagnosis not present

## 2018-11-06 DIAGNOSIS — Z881 Allergy status to other antibiotic agents status: Secondary | ICD-10-CM | POA: Insufficient documentation

## 2018-11-06 DIAGNOSIS — I132 Hypertensive heart and chronic kidney disease with heart failure and with stage 5 chronic kidney disease, or end stage renal disease: Secondary | ICD-10-CM | POA: Insufficient documentation

## 2018-11-06 DIAGNOSIS — K5730 Diverticulosis of large intestine without perforation or abscess without bleeding: Secondary | ICD-10-CM | POA: Insufficient documentation

## 2018-11-06 DIAGNOSIS — I2510 Atherosclerotic heart disease of native coronary artery without angina pectoris: Secondary | ICD-10-CM | POA: Diagnosis not present

## 2018-11-06 DIAGNOSIS — Z955 Presence of coronary angioplasty implant and graft: Secondary | ICD-10-CM | POA: Insufficient documentation

## 2018-11-06 DIAGNOSIS — D126 Benign neoplasm of colon, unspecified: Secondary | ICD-10-CM | POA: Diagnosis not present

## 2018-11-06 DIAGNOSIS — K219 Gastro-esophageal reflux disease without esophagitis: Secondary | ICD-10-CM | POA: Diagnosis not present

## 2018-11-06 DIAGNOSIS — I252 Old myocardial infarction: Secondary | ICD-10-CM | POA: Insufficient documentation

## 2018-11-06 DIAGNOSIS — K648 Other hemorrhoids: Secondary | ICD-10-CM | POA: Diagnosis not present

## 2018-11-06 DIAGNOSIS — Z888 Allergy status to other drugs, medicaments and biological substances status: Secondary | ICD-10-CM | POA: Insufficient documentation

## 2018-11-06 DIAGNOSIS — K259 Gastric ulcer, unspecified as acute or chronic, without hemorrhage or perforation: Secondary | ICD-10-CM | POA: Diagnosis not present

## 2018-11-06 DIAGNOSIS — Z992 Dependence on renal dialysis: Secondary | ICD-10-CM | POA: Insufficient documentation

## 2018-11-06 DIAGNOSIS — E785 Hyperlipidemia, unspecified: Secondary | ICD-10-CM | POA: Diagnosis not present

## 2018-11-06 HISTORY — PX: ESOPHAGOGASTRODUODENOSCOPY (EGD) WITH PROPOFOL: SHX5813

## 2018-11-06 HISTORY — PX: COLONOSCOPY WITH PROPOFOL: SHX5780

## 2018-11-06 LAB — GLUCOSE, CAPILLARY: Glucose-Capillary: 130 mg/dL — ABNORMAL HIGH (ref 70–99)

## 2018-11-06 SURGERY — ESOPHAGOGASTRODUODENOSCOPY (EGD) WITH PROPOFOL
Anesthesia: General

## 2018-11-06 MED ORDER — PROPOFOL 500 MG/50ML IV EMUL
INTRAVENOUS | Status: DC | PRN
  Administered 2018-11-06: 150 ug/kg/min via INTRAVENOUS

## 2018-11-06 MED ORDER — FENTANYL CITRATE (PF) 100 MCG/2ML IJ SOLN
INTRAMUSCULAR | Status: AC
  Filled 2018-11-06: qty 2

## 2018-11-06 MED ORDER — PROPOFOL 10 MG/ML IV BOLUS
INTRAVENOUS | Status: AC
  Filled 2018-11-06: qty 20

## 2018-11-06 MED ORDER — PHENYLEPHRINE HCL (PRESSORS) 10 MG/ML IV SOLN
INTRAVENOUS | Status: DC | PRN
  Administered 2018-11-06: 100 ug via INTRAVENOUS

## 2018-11-06 MED ORDER — EPHEDRINE SULFATE 50 MG/ML IJ SOLN
INTRAMUSCULAR | Status: DC | PRN
  Administered 2018-11-06 (×2): 10 mg via INTRAVENOUS

## 2018-11-06 MED ORDER — PROPOFOL 10 MG/ML IV BOLUS
INTRAVENOUS | Status: DC | PRN
  Administered 2018-11-06: 20 mg via INTRAVENOUS
  Administered 2018-11-06: 10 mg via INTRAVENOUS
  Administered 2018-11-06: 20 mg via INTRAVENOUS
  Administered 2018-11-06: 50 mg via INTRAVENOUS
  Administered 2018-11-06: 20 mg via INTRAVENOUS

## 2018-11-06 MED ORDER — LIDOCAINE HCL (PF) 2 % IJ SOLN
INTRAMUSCULAR | Status: AC
  Filled 2018-11-06: qty 10

## 2018-11-06 MED ORDER — FENTANYL CITRATE (PF) 100 MCG/2ML IJ SOLN
INTRAMUSCULAR | Status: DC | PRN
  Administered 2018-11-06: 25 ug via INTRAVENOUS

## 2018-11-06 MED ORDER — SODIUM CHLORIDE 0.9 % IV SOLN
INTRAVENOUS | Status: DC
  Administered 2018-11-06: 07:00:00 1000 mL via INTRAVENOUS

## 2018-11-06 NOTE — Transfer of Care (Signed)
Immediate Anesthesia Transfer of Care Note  Patient: Thomas [name]  Procedure(s) Performed: ESOPHAGOGASTRODUODENOSCOPY (EGD) WITH PROPOFOL (N/A ) COLONOSCOPY WITH PROPOFOL (N/A )  Patient Location: PACU  Anesthesia Type:General  Level of Consciousness: sedated  Airway & Oxygen Therapy: Patient Spontanous Breathing and Patient connected to nasal cannula oxygen  Post-op Assessment: Report given to RN and Post -op Vital signs reviewed and stable  Post vital signs: Reviewed and stable  Last Vitals:  Vitals Value Taken Time  BP 97/46 11/06/18 0849  Temp 36.3 C 11/06/18 0849  Pulse 70 11/06/18 0853  Resp 24 11/06/18 0853  SpO2 97 % 11/06/18 0853  Vitals shown include unvalidated device data.  Last Pain:  Vitals:   11/06/18 0849  TempSrc: Tympanic  PainSc: Asleep         Complications: No apparent anesthesia complications

## 2018-11-06 NOTE — H&P (Signed)
Outpatient short stay form Pre-procedure 11/06/2018 7:34 AM Lollie Sails MD  Primary Physician: Dr. Lelon Huh  Reason for visit: EGD and colonoscopy  History of present illness: Patient is a 73 year old male presenting today for an EGD and colonoscopy.  He does have some difficulty with dysphagia.  He does not regurgitate foods.  This is dysphagia to both liquids and solids.  He does have upper and lower false teeth that are over 3 years old.  He also has a personal history of adenomatous colon polyps and were doing an update colonoscopy today.  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  He tolerated his prep well.  He takes it has been 6 days since his last dose of Plavix.  Takes 81 mg aspirin daily.    Current Facility-Administered Medications:  .  0.9 %  sodium chloride infusion, , Intravenous, Continuous, Lollie Sails, MD, Last Rate: 20 mL/hr at 11/06/18 0704, 1,000 mL at 11/06/18 4008  Medications Prior to Admission  Medication Sig Dispense Refill Last Dose  . acetaminophen (TYLENOL) 500 MG tablet Take 500 mg by mouth every 8 (eight) hours as needed for mild pain or headache.   11/05/2018 at Unknown time  . ALPRAZolam (XANAX) 0.25 MG tablet TAKE 1 TABLET BY MOUTH ONCE DAILY AS NEEDED FOR ANXIETY 20 tablet 5 11/05/2018 at Unknown time  . aspirin EC 81 MG tablet Take 81 mg by mouth daily.   11/05/2018 at Unknown time  . atorvastatin (LIPITOR) 80 MG tablet TAKE 1 TABLET BY MOUTH ONCE DAILY 30 tablet 5 11/05/2018 at Unknown time  . B Complex-C-Folic Acid (RENA-VITE PO) Take by mouth daily.   11/05/2018 at Unknown time  . Blood Glucose Monitoring Suppl (ONE TOUCH ULTRA 2) w/Device KIT Use to check gluocose daily for type 2 diabetes 1 each 0 11/05/2018 at Unknown time  . calcium acetate (PHOSLO) 667 MG capsule TAKE 1 CAPSULE BY MOUTH 3 TIMES DAILY WITH MEALS 90 capsule 5 11/06/2018 at  0545  . clopidogrel (PLAVIX) 75 MG tablet TAKE 1 TABLET BY MOUTH ONCE DAILY 30 tablet 2 Past Week at Unknown time  . glucose blood test strip Contour [employer] test strips and lancets Use to check blood sugar daily for type 2 diabetes, E11.9. 100 each 4 11/05/2018 at Unknown time  . insulin degludec (TRESIBA FLEXTOUCH) 100 UNIT/ML SOPN FlexTouch Pen Inject 0.06 mLs (6 Units total) into the skin daily. Increase by 2 units a day if fasting glucose is over 200 3 mL 0 11/05/2018 at Unknown time  . isosorbide mononitrate (IMDUR) 60 MG 24 hr tablet TAKE 1 TABLET BY MOUTH ONCE DAILY 30 tablet 5 11/05/2018 at Unknown time  . LORazepam (ATIVAN) 0.5 MG tablet Take 1 tablet (0.5 mg total) by mouth at bedtime as needed for sleep. 30 tablet 3 11/05/2018 at Unknown time  . losartan (COZAAR) 25 MG tablet Take 1 tablet (25 mg total) by mouth every evening. 30 tablet 6 11/05/2018 at Unknown time  . magnesium oxide (MAG-OX) 400 MG tablet Take 400 mg by mouth daily.   11/05/2018 at Unknown time  . metoprolol succinate (TOPROL-XL) 50 MG 24 hr tablet TAKE 1 TABLET BY MOUTH ONCE DAILY 30 tablet 5 11/05/2018 at Unknown time  . pantoprazole (PROTONIX) 40 MG tablet Take 1 tablet (40 mg total) by mouth daily. 30 tablet 12 11/06/2018 at 0545  . polycarbophil (FIBERCON) 625 MG tablet Take  625 mg by mouth as needed.   11/05/2018 at Unknown time  . promethazine (PHENERGAN) 25 MG tablet Take 25 mg by mouth every 6 (six) hours as needed for nausea or vomiting.   11/05/2018 at Unknown time  . ramelteon (ROZEREM) 8 MG tablet Take 8 mg by mouth at bedtime.   11/05/2018 at Unknown time  . sodium bicarbonate 650 MG tablet Take 650 mg by mouth 2 (two) times daily.   11/05/2018 at Unknown time  . tamsulosin (FLOMAX) 0.4 MG CAPS capsule TAKE 1 CAPSULE BY MOUTH ONCE DAILY 30 MINUTES AFTER LARGEST MEAL. 30 capsule 5 11/05/2018 at Unknown time  . terbinafine (LAMISIL) 250 MG tablet Take 250 mg by mouth daily.   11/05/2018 at Unknown time  . torsemide  (DEMADEX) 100 MG tablet Take 100 mg by mouth daily. Do not take on dialysis days   11/06/2018 at 0545  . traZODone (DESYREL) 50 MG tablet TAKE 1/2 TO 1 TABLET BY MOUTH AT BEDTIMEAS NEEDED SLEEP 30 tablet 5 11/05/2018 at Unknown time  . traMADol (ULTRAM) 50 MG tablet Take 1 tablet (50 mg total) by mouth every 6 (six) hours as needed (Pain). 28 tablet 3      Allergies  Allergen Reactions  . Ciprofloxacin Itching and Swelling    Facial swelling   . Hydralazine Nausea And Vomiting  . Trulicity [Dulaglutide] Other (See Comments)    Bloating     Past Medical History:  Diagnosis Date  . Allergy   . Arthritis   . CAD (coronary artery disease)   . Chronic kidney disease   . Diabetes mellitus without complication ([place])   . Dyspnea   . Erosive esophagitis   . ESRD (end stage renal disease) on dialysis ([place]) 2019   MWF, Left arm fistula  . GERD (gastroesophageal reflux disease)   . Gouty arthropathy   . History of colonic polyps   . History of kidney stones   . History of MRSA infection   . Hyperkalemia   . Hyperlipidemia   . Hypertension   . Melanoma ([place])   . Myocardial infarction ([place]) 1986, 2019  . Peripheral vascular disease ([place])   . 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 Trigger finger of left hand   . Ureteral tumor     Review of systems:      Physical Exam    Heart and lungs: Rhythm without rub or gallop lungs are bilaterally clear    HEENT: Normocephalic atraumatic eyes are anicteric    Other:    Pertinant exam for procedure: Soft nontender nondistended bowel sounds positive normoactive.  Mild protuberance.    Planned proceedures: EGD colonoscopy and indicated procedures. I have discussed the risks benefits and complications of procedures to include not limited to bleeding, infection, perforation and the risk of sedation and the patient wishes to proceed.    Lollie Sails, MD Gastroenterology 11/06/2018  7:34 AM

## 2018-11-06 NOTE — Anesthesia Postprocedure Evaluation (Signed)
Anesthesia Post Note  Patient: Thomas [name]  Procedure(s) Performed: ESOPHAGOGASTRODUODENOSCOPY (EGD) WITH PROPOFOL (N/A ) COLONOSCOPY WITH PROPOFOL (N/A )  Patient location during evaluation: Endoscopy Anesthesia Type: General Level of consciousness: awake and alert Pain management: pain level controlled Vital Signs Assessment: post-procedure vital signs reviewed and stable Respiratory status: spontaneous breathing, nonlabored ventilation, respiratory function stable and patient connected to nasal cannula oxygen Cardiovascular status: blood pressure returned to baseline and stable Postop Assessment: no apparent nausea or vomiting Anesthetic complications: no     Last Vitals:  Vitals:   11/06/18 0909 11/06/18 0919  BP: 115/61 115/76  Pulse: 63 72  Resp: 12 17  Temp:    SpO2: 96% 97%    Last Pain:  Vitals:   11/06/18 0919  TempSrc:   PainSc: 0-No pain                 Martha Clan

## 2018-11-06 NOTE — Telephone Encounter (Signed)
Dr Caryn Section Can you review the x-ray and then advise

## 2018-11-06 NOTE — Anesthesia Preprocedure Evaluation (Addendum)
Anesthesia Evaluation  Patient identified by MRN, date of birth, ID band Patient awake    Reviewed: Allergy & Precautions, H&P , NPO status , Patient's Chart, lab work & pertinent test results  History of Anesthesia Complications Negative for: history of anesthetic complications  Airway Mallampati: III  TM Distance: >3 FB Neck ROM: full    Dental  (+) Upper Dentures, Lower Dentures   Pulmonary shortness of breath (SOB improved since stents placed in February), COPD, neg recent URI, former smoker,    breath sounds clear to auscultation       Cardiovascular hypertension, (-) angina+ CAD, + Past MI, + Cardiac Stents (February 2019), + CABG, + Peripheral Vascular Disease and +CHF  (-) dysrhythmias (-) Valvular Problems/Murmurs Rhythm:Regular Rate:Normal     Neuro/Psych PSYCHIATRIC DISORDERS Anxiety negative neurological ROS     GI/Hepatic negative GI ROS, Neg liver ROS, PUD, GERD  ,  Endo/Other  negative endocrine ROSdiabetes, Type 2  Renal/GU ESRF and DialysisRenal disease     Musculoskeletal  (+) Arthritis ,   Abdominal   Peds  Hematology negative hematology ROS (+)   Anesthesia Other Findings Past Medical History: No date: Allergy No date: Arthritis No date: CAD (coronary artery disease) No date: Chronic kidney disease No date: Diabetes mellitus without complication (HCC) No date: Dyspnea No date: Erosive esophagitis No date: GERD (gastroesophageal reflux disease) No date: Gouty arthropathy No date: History of colonic polyps No date: History of kidney stones No date: History of MRSA infection No date: Hyperkalemia No date: Hyperlipidemia No date: Hypertension No date: Melanoma ([place]) 1986: Myocardial infarction ([place]) No date: Peripheral vascular disease ([place]) No date: Squamous cell cancer of external ear, right     Comment:  07/2016 No date: Trigger finger of left hand No date: Ureteral tumor  Past  Surgical History: No date: CATARACT EXTRACTION 1996: CORONARY ARTERY BYPASS GRAFT 05/29/2017: CORONARY STENT INTERVENTION; N/A     Comment:  Procedure: CORONARY STENT INTERVENTION;  Surgeon:               Isaias Cowman, MD;  Location: [place] [facility]               [facility];  Service: Cardiovascular;  Laterality: N/A; No date: EXCISION Ureteral Tumor     Comment:  (benign) Dr. Quillian Quince No date: EYE SURGERY; Bilateral     Comment:  Cataract Extraction with IOL 10/18/2016: LEFT HEART CATH AND CORONARY ANGIOGRAPHY; N/A     Comment:  Procedure: Left Heart Cath and Coronary Angiography;                Surgeon: Isaias Cowman, MD;  Location: [place] [facility];  Service: Cardiovascular;  Laterality:               N/A; 05/29/2017: LEFT HEART CATH AND CORONARY ANGIOGRAPHY; N/A     Comment:  Procedure: LEFT HEART CATH AND CORONARY ANGIOGRAPHY;                Surgeon: Isaias Cowman, MD;  Location: [place] [facility] [facility];  Service: Cardiovascular;  Laterality:               N/A; 10/31/2016: LOWER EXTREMITY ANGIOGRAPHY; Right     Comment:  Procedure: Lower Extremity Angiography;  Surgeon: Lucky Cowboy,  Erskine Squibb, MD;  Location: [place] [facility];  Service:               Cardiovascular;  Laterality: Right; 11/14/2016: LOWER EXTREMITY ANGIOGRAPHY; Left     Comment:  Procedure: Lower Extremity Angiography;  Surgeon: Algernon Huxley, MD;  Location: [place];  Service:               Cardiovascular;  Laterality: Left; 11/14/2016: LOWER EXTREMITY INTERVENTION     Comment:  Procedure: Lower Extremity Intervention;  Surgeon: Algernon Huxley, MD;  Location: [place] [facility];  Service:               Cardiovascular;; November 2107: RETINAL DETACHMENT SURGERY; Right  BMI    Body Mass Index:  25.82 kg/m      Reproductive/Obstetrics negative OB ROS                             Anesthesia  Physical  Anesthesia Plan  ASA: III  Anesthesia Plan: General   Post-op Pain Management:    Induction: Intravenous  PONV Risk Score and Plan: 2 and Propofol infusion and TIVA  Airway Management Planned: Nasal Cannula and Natural Airway  Additional Equipment:   Intra-op Plan:   Post-operative Plan:   Informed Consent: I have reviewed the patients History and Physical, chart, labs and discussed the procedure including the risks, benefits and alternatives for the proposed anesthesia with the patient or authorized representative who has indicated his/her understanding and acceptance.     Dental Advisory Given  Plan Discussed with: Anesthesiologist, CRNA and Surgeon  Anesthesia Plan Comments:        Anesthesia Quick Evaluation

## 2018-11-06 NOTE — Anesthesia Post-op Follow-up Note (Signed)
Anesthesia QCDR form completed.        

## 2018-11-06 NOTE — Op Note (Signed)
[facility] Gastroenterology Patient Name: Thomas [name] Procedure Date: 11/06/2018 7:40 AM MRN: [id number] Account #: [id number] Date of Birth: [birth year] Admit Type: Outpatient Age: 73 Room: [facility] ENDO ROOM 2 Gender: Male Note Status: Finalized Procedure:            Upper GI endoscopy Indications:          Dysphagia Providers:            Lollie Sails, MD Referring MD:         Kirstie Peri. Caryn Section, MD (Referring MD) Medicines:            Monitored Anesthesia Care Complications:        No immediate complications. Procedure:            Pre-Anesthesia Assessment:                       - ASA Grade Assessment: III - A patient with severe                        systemic disease.                       After obtaining informed consent, the endoscope was                        passed under direct vision. Throughout the procedure,                        the patient's blood pressure, pulse, and oxygen                        saturations were monitored continuously. The Endoscope                        was introduced through the mouth, and advanced to the                        third part of duodenum. The upper GI endoscopy was                        accomplished without difficulty. The patient tolerated                        the procedure well. Findings:      Abnormal motility was noted in the middle third of the esophagus and in       the lower third of the esophagus. The cricopharyngeus was normal. There       is a decrease in motility of the esophageal body. The distal       esophagus/lower esophageal sphincter is open. Tertiary peristaltic waves       are noted.      A small to medium hiatal hernia was found. The Z-line was a variable       distance from incisors; the hiatal hernia was sliding.      Diffuse and patchy moderate inflammation characterized by congestion       (edema), erosions and erythema was found in the gastric body and in the       gastric antrum.  Biopsies were taken with a cold forceps for histology.       Biopsies were taken with a cold forceps  for Helicobacter pylori testing.      The Z-line was regular. Biopsies were taken with a cold forceps for       histology.      The examined duodenum was normal. Impression:           - Abnormal esophageal motility, consistent with                        presbyesophagus.                       - Small hiatal hernia.                       - Gastritis. Biopsied.                       - Z-line regular. Biopsied.                       - Normal examined duodenum. Recommendation:       - Discharge patient to home.                       - Use Protonix (pantoprazole) 40 mg PO daily daily.                       - Await pathology results.                       - Return to GI clinic in 4 weeks. Procedure Code(s):    --- Professional ---                       [phone], Esophagogastroduodenoscopy, flexible, transoral;                        with biopsy, single or multiple Diagnosis Code(s):    --- Professional ---                       K22.4, Dyskinesia of esophagus                       K44.9, Diaphragmatic hernia without obstruction or                        gangrene                       K29.70, Gastritis, unspecified, without bleeding                       R13.10, Dysphagia, unspecified CPT copyright 2019 American Medical Association. All rights reserved. The codes documented in this report are preliminary and upon coder review may  be revised to meet current compliance requirements. Lollie Sails, MD 11/06/2018 8:08:28 AM This report has been signed electronically. Number of Addenda: 0 Note Initiated On: 11/06/2018 7:40 AM      [facility]

## 2018-11-06 NOTE — Anesthesia Procedure Notes (Signed)
Date/Time: 11/06/2018 7:45 AM Performed by: Allean Found, CRNA Pre-anesthesia Checklist: Patient identified, Emergency Drugs available, Suction available, Patient being monitored and Timeout performed Oxygen Delivery Method: Nasal cannula Placement Confirmation: positive ETCO2

## 2018-11-06 NOTE — Op Note (Signed)
[facility] Gastroenterology Patient Name: Thomas [name] Procedure Date: 11/06/2018 7:25 AM MRN: [id number] Account #: [id number] Date of Birth: 07-17-1945 Admit Type: Outpatient Age: 73 Room: [facility] ENDO ROOM 2 Gender: Male Note Status: Finalized Procedure:            Colonoscopy Indications:          Personal history of colonic polyps Providers:            Lollie Sails, MD Referring MD:         Kirstie Peri. Caryn Section, MD (Referring MD) Medicines:            Monitored Anesthesia Care Complications:        No immediate complications. Procedure:            Pre-Anesthesia Assessment:                       - ASA Grade Assessment: III - A patient with severe                        systemic disease.                       After obtaining informed consent, the colonoscope was                        passed under direct vision. Throughout the procedure,                        the patient's blood pressure, pulse, and oxygen                        saturations were monitored continuously. The                        Colonoscope was introduced through the anus and                        advanced to the the cecum, identified by appendiceal                        orifice and ileocecal valve. The colonoscopy was                        performed without difficulty. The patient tolerated the                        procedure well. The quality of the bowel preparation                        was good. Findings:      Two sessile polyps were found in the distal transverse colon. The polyps       were 2 to 5 mm in size. These polyps were removed with a cold snare.       Resection and retrieval were complete.      Two sessile polyps were found in the ascending colon. The polyps were 3       to 5 mm in size. These polyps were removed with a cold snare. Resection       and retrieval were complete.      A 2 mm polyp was found in the  distal sigmoid colon. The polyp was       sessile. The  polyp was removed with a cold biopsy forceps. Resection and       retrieval were complete.      A 6 mm polyp was found in the mid descending colon. The polyp was       sessile. The polyp was removed with a cold snare. Resection and       retrieval were complete.      Non-bleeding external and internal hemorrhoids were found during       retroflexion and during anoscopy. The hemorrhoids were small and Grade       II (internal hemorrhoids that prolapse but reduce spontaneously).      A few small-mouthed diverticula were found in the sigmoid colon and       distal descending colon.      No additional abnormalities were found on retroflexion.      The digital rectal exam was normal. Impression:           - Two 2 to 5 mm polyps in the distal transverse colon,                        removed with a cold snare. Resected and retrieved.                       - Two 3 to 5 mm polyps in the ascending colon, removed                        with a cold snare. Resected and retrieved.                       - One 2 mm polyp in the distal sigmoid colon, removed                        with a cold biopsy forceps. Resected and retrieved.                       - One 6 mm polyp in the mid descending colon, removed                        with a cold snare. Resected and retrieved.                       - Non-bleeding external and internal hemorrhoids.                       - Diverticulosis in the sigmoid colon and in the distal                        descending colon. Recommendation:       - Discharge patient to home.                       - Soft diet today, then advance as tolerated to advance                        diet as tolerated.                       - Resume Plavix (clopidogrel) at prior dose tomorrow.  Procedure Code(s):    --- Professional ---                       [phone], Colonoscopy, flexible; with removal of tumor(s),                        polyp(s), or other lesion(s) by snare technique                        45380, 30, Colonoscopy, flexible; with biopsy, single                        or multiple Diagnosis Code(s):    --- Professional ---                       K63.5, Polyp of colon                       K64.1, Second degree hemorrhoids                       Z86.010, Personal history of colonic polyps                       K57.30, Diverticulosis of large intestine without                        perforation or abscess without bleeding CPT copyright 2019 American Medical Association. All rights reserved. The codes documented in this report are preliminary and upon coder review may  be revised to meet current compliance requirements. Lollie Sails, MD 11/06/2018 8:46:21 AM This report has been signed electronically. Number of Addenda: 0 Note Initiated On: 11/06/2018 7:25 AM Scope Withdrawal Time: 0 hours 15 minutes 55 seconds  Total Procedure Duration: 0 hours 28 minutes 55 seconds       [facility]

## 2018-11-06 NOTE — Telephone Encounter (Signed)
Pt's daughter looked at fathers cxr results and had some questions  Please call her back at [phone]  Thanks teri

## 2018-11-07 ENCOUNTER — Encounter: Payer: Self-pay | Admitting: Gastroenterology

## 2018-11-07 DIAGNOSIS — N186 End stage renal disease: Secondary | ICD-10-CM | POA: Diagnosis not present

## 2018-11-07 DIAGNOSIS — Z992 Dependence on renal dialysis: Secondary | ICD-10-CM | POA: Diagnosis not present

## 2018-11-07 LAB — SURGICAL PATHOLOGY

## 2018-11-07 NOTE — Telephone Encounter (Signed)
There or some very small lung nodule which appear to be benign, but need to be rechecked in 6 months. Some irregularity of liver, not really very specific but could be a sign of very early cirrhosis. May be worth seen liver specialist to follow. Can enter order for GI referral.

## 2018-11-08 NOTE — Telephone Encounter (Signed)
Patient's daughter Lexine Baton was concerned about the irregularity of his liver. I advised her as below. She agrees to GI referral. Please schedule. Patient would like to go back to Dr. Donnella Sham.

## 2018-11-09 ENCOUNTER — Telehealth: Payer: Self-pay

## 2018-11-09 ENCOUNTER — Other Ambulatory Visit: Payer: Self-pay | Admitting: Family Medicine

## 2018-11-09 DIAGNOSIS — I5023 Acute on chronic systolic (congestive) heart failure: Secondary | ICD-10-CM

## 2018-11-09 DIAGNOSIS — Z992 Dependence on renal dialysis: Secondary | ICD-10-CM | POA: Diagnosis not present

## 2018-11-09 DIAGNOSIS — N186 End stage renal disease: Secondary | ICD-10-CM | POA: Diagnosis not present

## 2018-11-09 DIAGNOSIS — F410 Panic disorder [episodic paroxysmal anxiety] without agoraphobia: Secondary | ICD-10-CM

## 2018-11-09 MED ORDER — ALPRAZOLAM 0.25 MG PO TABS: 0.2500 mg | ORAL_TABLET | Freq: Every day | ORAL | 3 refills | Status: DC | PRN

## 2018-11-09 NOTE — Telephone Encounter (Signed)
Thomas [name] patient's daughter calling that patient is needing more of the Xanax. She reports that he usually takes it when he goes for Dialysis. Per daughter they have been giving patient a xanax at least one a day to help with his anxiety and with his sleep, pt already taking Trazodone for his sleep.she reports that he has 5 more left and wants to know if Dr.Fisher can write this differently so that patient doesn't run out.  Pharmacy: [facility]

## 2018-11-12 DIAGNOSIS — Z794 Long term (current) use of insulin: Secondary | ICD-10-CM | POA: Diagnosis not present

## 2018-11-12 DIAGNOSIS — D509 Iron deficiency anemia, unspecified: Secondary | ICD-10-CM | POA: Diagnosis not present

## 2018-11-12 DIAGNOSIS — N186 End stage renal disease: Secondary | ICD-10-CM | POA: Diagnosis not present

## 2018-11-12 DIAGNOSIS — Z992 Dependence on renal dialysis: Secondary | ICD-10-CM | POA: Diagnosis not present

## 2018-11-12 DIAGNOSIS — E119 Type 2 diabetes mellitus without complications: Secondary | ICD-10-CM | POA: Diagnosis not present

## 2018-11-14 DIAGNOSIS — N186 End stage renal disease: Secondary | ICD-10-CM | POA: Diagnosis not present

## 2018-11-14 DIAGNOSIS — Z992 Dependence on renal dialysis: Secondary | ICD-10-CM | POA: Diagnosis not present

## 2018-11-16 DIAGNOSIS — Z992 Dependence on renal dialysis: Secondary | ICD-10-CM | POA: Diagnosis not present

## 2018-11-16 DIAGNOSIS — N186 End stage renal disease: Secondary | ICD-10-CM | POA: Diagnosis not present

## 2018-11-17 ENCOUNTER — Other Ambulatory Visit (INDEPENDENT_AMBULATORY_CARE_PROVIDER_SITE_OTHER): Payer: Self-pay | Admitting: Vascular Surgery

## 2018-11-19 DIAGNOSIS — N186 End stage renal disease: Secondary | ICD-10-CM | POA: Diagnosis not present

## 2018-11-19 DIAGNOSIS — I509 Heart failure, unspecified: Secondary | ICD-10-CM | POA: Diagnosis not present

## 2018-11-19 DIAGNOSIS — Z992 Dependence on renal dialysis: Secondary | ICD-10-CM | POA: Diagnosis not present

## 2018-11-21 ENCOUNTER — Encounter: Payer: Self-pay | Admitting: Family Medicine

## 2018-11-21 DIAGNOSIS — K2289 Other specified disease of esophagus: Secondary | ICD-10-CM | POA: Insufficient documentation

## 2018-11-21 DIAGNOSIS — Z992 Dependence on renal dialysis: Secondary | ICD-10-CM | POA: Diagnosis not present

## 2018-11-21 DIAGNOSIS — N186 End stage renal disease: Secondary | ICD-10-CM | POA: Diagnosis not present

## 2018-11-21 DIAGNOSIS — K228 Other specified diseases of esophagus: Secondary | ICD-10-CM | POA: Insufficient documentation

## 2018-11-21 DIAGNOSIS — I509 Heart failure, unspecified: Secondary | ICD-10-CM | POA: Diagnosis not present

## 2018-11-24 DIAGNOSIS — Z992 Dependence on renal dialysis: Secondary | ICD-10-CM | POA: Diagnosis not present

## 2018-11-24 DIAGNOSIS — I509 Heart failure, unspecified: Secondary | ICD-10-CM | POA: Diagnosis not present

## 2018-11-24 DIAGNOSIS — N186 End stage renal disease: Secondary | ICD-10-CM | POA: Diagnosis not present

## 2018-11-26 DIAGNOSIS — N2581 Secondary hyperparathyroidism of renal origin: Secondary | ICD-10-CM | POA: Diagnosis not present

## 2018-11-26 DIAGNOSIS — Z992 Dependence on renal dialysis: Secondary | ICD-10-CM | POA: Diagnosis not present

## 2018-11-26 DIAGNOSIS — I509 Heart failure, unspecified: Secondary | ICD-10-CM | POA: Diagnosis not present

## 2018-11-26 DIAGNOSIS — N186 End stage renal disease: Secondary | ICD-10-CM | POA: Diagnosis not present

## 2018-11-28 ENCOUNTER — Encounter: Payer: Self-pay | Admitting: Surgery

## 2018-11-28 ENCOUNTER — Ambulatory Visit (INDEPENDENT_AMBULATORY_CARE_PROVIDER_SITE_OTHER): Payer: Medicare Other | Admitting: Surgery

## 2018-11-28 ENCOUNTER — Other Ambulatory Visit: Payer: Self-pay

## 2018-11-28 VITALS — BP 139/75 | HR 83 | Temp 97.7°F | Resp 18 | Ht 66.0 in | Wt 161.8 lb

## 2018-11-28 DIAGNOSIS — K7460 Unspecified cirrhosis of liver: Secondary | ICD-10-CM

## 2018-11-28 DIAGNOSIS — I509 Heart failure, unspecified: Secondary | ICD-10-CM | POA: Diagnosis not present

## 2018-11-28 DIAGNOSIS — R188 Other ascites: Secondary | ICD-10-CM

## 2018-11-28 DIAGNOSIS — N186 End stage renal disease: Secondary | ICD-10-CM | POA: Diagnosis not present

## 2018-11-28 DIAGNOSIS — Z992 Dependence on renal dialysis: Secondary | ICD-10-CM | POA: Diagnosis not present

## 2018-11-28 DIAGNOSIS — M109 Gout, unspecified: Secondary | ICD-10-CM | POA: Insufficient documentation

## 2018-11-28 DIAGNOSIS — K439 Ventral hernia without obstruction or gangrene: Secondary | ICD-10-CM

## 2018-11-28 NOTE — Patient Instructions (Addendum)
Please go to the lab at [facility] on Thursday 11/29/2018 to have blood drawn.  Ultrasound is scheduled 12/04/18 @ 9:45 am @ [facility]. Nothing to eat or drink 4 hours prior.  Please see your follow up appointment listed below.  We sent the referral to [facility] (Dr.Anna) someone from their office will call to schedule an appointment within a few days. If you do not hear from their office by Friday of this week please call so we can check on this for you.   Hiatal Hernia  A hiatal hernia occurs when part of the stomach slides above the muscle that separates the abdomen from the chest (diaphragm). A person can be born with a hiatal hernia (congenital), or it may develop over time. In almost all cases of hiatal hernia, only the top part of the stomach pushes through the diaphragm. Many people have a hiatal hernia with no symptoms. The larger the hernia, the more likely it is that you will have symptoms. In some cases, a hiatal hernia allows stomach acid to flow back into the tube that carries food from your mouth to your stomach (esophagus). This may cause heartburn symptoms. Severe heartburn symptoms may mean that you have developed a condition called gastroesophageal reflux disease (GERD). What are the causes? This condition is caused by a weakness in the opening (hiatus) where the esophagus passes through the diaphragm to attach to the upper part of the stomach. A person may be born with a weakness in the hiatus, or a weakness can develop over time. What increases the risk? This condition is more likely to develop in:  Older people. Age is a major risk factor for a hiatal hernia, especially if you are over the age of 75.  Pregnant women.  People who are overweight.  People who have frequent constipation. What are the signs or symptoms? Symptoms of this condition usually develop in the form of GERD symptoms. Symptoms include:  Heartburn.  Belching.  Indigestion.  Trouble swallowing.  Coughing or  wheezing.  Sore throat.  Hoarseness.  Chest pain.  Nausea and vomiting. How is this diagnosed? This condition may be diagnosed during testing for GERD. Tests that may be done include:  X-rays of your stomach or chest.  An upper gastrointestinal (GI) series. This is an X-ray exam of your GI tract that is taken after you swallow a chalky liquid that shows up clearly on the X-ray.  Endoscopy. This is a procedure to look into your stomach using a thin, flexible tube that has a tiny camera and light on the end of it. How is this treated? This condition may be treated by:  Dietary and lifestyle changes to help reduce GERD symptoms.  Medicines. These may include: ? Over-the-counter antacids. ? Medicines that make your stomach empty more quickly. ? Medicines that block the production of stomach acid (H2 blockers). ? Stronger medicines to reduce stomach acid (proton pump inhibitors).  Surgery to repair the hernia, if other treatments are not helping. If you have no symptoms, you may not need treatment. Follow these instructions at home: Lifestyle and activity  Do not use any products that contain nicotine or tobacco, such as cigarettes and e-cigarettes. If you need help quitting, ask your health care provider.  Try to achieve and maintain a healthy body weight.  Avoid putting pressure on your abdomen. Anything that puts pressure on your abdomen increases the amount of acid that may be pushed up into your esophagus. ? Avoid bending over, especially  after eating. ? Raise the head of your bed by putting blocks under the legs. This keeps your head and esophagus higher than your stomach. ? Do not wear tight clothing around your chest or stomach. ? Try not to strain when having a bowel movement, when urinating, or when lifting heavy objects. Eating and drinking  Avoid foods that can worsen GERD symptoms. These may include: ? Fatty foods, like fried foods. ? Citrus fruits, like oranges  or lemon. ? Other foods and drinks that contain acid, like orange juice or tomatoes. ? Spicy food. ? Chocolate.  Eat frequent small meals instead of three large meals a day. This helps prevent your stomach from getting too full. ? Eat slowly. ? Do not lie down right after eating. ? Do not eat 1-2 hours before bed.  Do not drink beverages with caffeine. These include cola, coffee, cocoa, and tea.  Do not drink alcohol. General instructions  Take over-the-counter and prescription medicines only as told by your health care provider.  Keep all follow-up visits as told by your health care provider. This is important. Contact a health care provider if:  Your symptoms are not controlled with medicines or lifestyle changes.  You are having trouble swallowing.  You have coughing or wheezing that will not go away. Get help right away if:  Your pain is getting worse.  Your pain spreads to your arms, neck, jaw, teeth, or back.  You have shortness of breath.  You sweat for no reason.  You feel sick to your stomach (nauseous) or you vomit.  You vomit blood.  You have bright red blood in your stools.  You have black, tarry stools. This information is not intended to replace advice given to you by your health care provider. Make sure you discuss any questions you have with your health care provider. Document Released: 06/25/2003 Document Revised: 03/17/2017 Document Reviewed: 11/07/2016 Elsevier Patient Education  2020 [employer].

## 2018-11-29 ENCOUNTER — Other Ambulatory Visit
Admission: RE | Admit: 2018-11-29 | Discharge: 2018-11-29 | Disposition: A | Discharge status: Home / Self Care | Payer: Medicare Other | Source: Ambulatory Visit | Attending: Surgery | Admitting: Surgery

## 2018-11-29 DIAGNOSIS — K439 Ventral hernia without obstruction or gangrene: Secondary | ICD-10-CM | POA: Diagnosis not present

## 2018-11-29 LAB — COMPREHENSIVE METABOLIC PANEL
ALT: 20 U/L (ref 0–44)
AST: 22 U/L (ref 15–41)
Albumin: 2.9 g/dL — ABNORMAL LOW (ref 3.5–5.0)
Alkaline Phosphatase: 282 U/L — ABNORMAL HIGH (ref 38–126)
Anion gap: 14 (ref 5–15)
BUN: 35 mg/dL — ABNORMAL HIGH (ref 8–23)
CO2: 28 mmol/L (ref 22–32)
Calcium: 8.9 mg/dL (ref 8.9–10.3)
Chloride: 96 mmol/L — ABNORMAL LOW (ref 98–111)
Creatinine, Ser: 2.89 mg/dL — ABNORMAL HIGH (ref 0.61–1.24)
GFR calc Af Amer: 24 mL/min — ABNORMAL LOW (ref >60)
GFR calc non Af Amer: 20 mL/min — ABNORMAL LOW (ref >60)
Glucose, Bld: 145 mg/dL — ABNORMAL HIGH (ref 70–99)
Potassium: 4.2 mmol/L (ref 3.5–5.1)
Sodium: 138 mmol/L (ref 135–145)
Total Bilirubin: 1.1 mg/dL (ref 0.3–1.2)
Total Protein: 7 g/dL (ref 6.5–8.1)

## 2018-11-29 LAB — CBC WITH DIFFERENTIAL/PLATELET
Abs Immature Granulocytes: 0.02 10*3/uL (ref 0.00–0.07)
Basophils Absolute: 0.1 10*3/uL (ref 0.0–0.1)
Basophils Relative: 1 %
Eosinophils Absolute: 0.5 10*3/uL (ref 0.0–0.5)
Eosinophils Relative: 6 %
HCT: 36.6 % — ABNORMAL LOW (ref 39.0–52.0)
Hemoglobin: 11.7 g/dL — ABNORMAL LOW (ref 13.0–17.0)
Immature Granulocytes: 0 %
Lymphocytes Relative: 10 %
Lymphs Abs: 0.8 10*3/uL (ref 0.7–4.0)
MCH: 30.7 pg (ref 26.0–34.0)
MCHC: 32 g/dL (ref 30.0–36.0)
MCV: 96.1 fL (ref 80.0–100.0)
Monocytes Absolute: 1 10*3/uL (ref 0.1–1.0)
Monocytes Relative: 12 %
Neutro Abs: 5.7 10*3/uL (ref 1.7–7.7)
Neutrophils Relative %: 71 %
Platelets: 224 10*3/uL (ref 150–400)
RBC: 3.81 MIL/uL — ABNORMAL LOW (ref 4.22–5.81)
RDW: 17 % — ABNORMAL HIGH (ref 11.5–15.5)
WBC: 8.1 10*3/uL (ref 4.0–10.5)
nRBC: 0 % (ref 0.0–0.2)

## 2018-11-29 LAB — PROTIME-INR
INR: 1.3 — ABNORMAL HIGH (ref 0.8–1.2)
Prothrombin Time: 15.8 seconds — ABNORMAL HIGH (ref 11.4–15.2)

## 2018-11-30 DIAGNOSIS — Z992 Dependence on renal dialysis: Secondary | ICD-10-CM | POA: Diagnosis not present

## 2018-11-30 DIAGNOSIS — I509 Heart failure, unspecified: Secondary | ICD-10-CM | POA: Diagnosis not present

## 2018-11-30 DIAGNOSIS — N186 End stage renal disease: Secondary | ICD-10-CM | POA: Diagnosis not present

## 2018-12-01 NOTE — Progress Notes (Signed)
73 year old well-known to our practice with a history of symptomatic ventral hernia.  He has significant comorbidities including cirrhosis, coronary artery disease, end-stage renal disease on dialysis, peripheral vascular disease.  He is on dual antiplatelet therapy to include Plavix and aspirin. He has Been evaluated for a ventral hernia that is been hurting intermittently for months now.  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  There is also evidence of cirrhosis with significant ascites. \ PE debilitated and chronically ill-appearing Abd: soft, obvious ascites.  Evidence of Swiss cheese defect throughout the abdominal wall no evidence of incarceration or strangulation.  A/P 73 year old male with cirrhosis ascites and a least child's B classification with findings of portal hypertension now comes with a symptomatic ventral hernia.  He is not a good surgical candidate at this time and his potential consequences of a major abdominal intervention I think outweighed the benefits of repair. I am going to refer him back to hematology and I will order a CBC, INR also to restage his liver disease.  There is no need for emergent surgical intervention.  Please note that I spent at least 45 minutes in this encounter with greater than 50% spent in coordination and counseling of his care

## 2018-12-03 DIAGNOSIS — I509 Heart failure, unspecified: Secondary | ICD-10-CM | POA: Diagnosis not present

## 2018-12-03 DIAGNOSIS — N186 End stage renal disease: Secondary | ICD-10-CM | POA: Diagnosis not present

## 2018-12-03 DIAGNOSIS — Z992 Dependence on renal dialysis: Secondary | ICD-10-CM | POA: Diagnosis not present

## 2018-12-04 ENCOUNTER — Other Ambulatory Visit: Payer: Self-pay

## 2018-12-04 ENCOUNTER — Ambulatory Visit
Admission: RE | Admit: 2018-12-04 | Discharge: 2018-12-04 | Disposition: A | Discharge status: Home / Self Care | Payer: Medicare Other | Source: Ambulatory Visit | Attending: Surgery | Admitting: Surgery

## 2018-12-04 DIAGNOSIS — E1122 Type 2 diabetes mellitus with diabetic chronic kidney disease: Secondary | ICD-10-CM | POA: Diagnosis not present

## 2018-12-04 DIAGNOSIS — I509 Heart failure, unspecified: Secondary | ICD-10-CM | POA: Diagnosis not present

## 2018-12-04 DIAGNOSIS — K439 Ventral hernia without obstruction or gangrene: Secondary | ICD-10-CM

## 2018-12-04 DIAGNOSIS — E785 Hyperlipidemia, unspecified: Secondary | ICD-10-CM | POA: Diagnosis not present

## 2018-12-04 DIAGNOSIS — Z87891 Personal history of nicotine dependence: Secondary | ICD-10-CM | POA: Diagnosis not present

## 2018-12-04 DIAGNOSIS — E1151 Type 2 diabetes mellitus with diabetic peripheral angiopathy without gangrene: Secondary | ICD-10-CM | POA: Diagnosis not present

## 2018-12-04 DIAGNOSIS — E876 Hypokalemia: Secondary | ICD-10-CM | POA: Diagnosis not present

## 2018-12-04 DIAGNOSIS — R0602 Shortness of breath: Secondary | ICD-10-CM | POA: Diagnosis not present

## 2018-12-04 DIAGNOSIS — I5022 Chronic systolic (congestive) heart failure: Secondary | ICD-10-CM | POA: Diagnosis not present

## 2018-12-04 DIAGNOSIS — Z79899 Other long term (current) drug therapy: Secondary | ICD-10-CM | POA: Diagnosis not present

## 2018-12-04 DIAGNOSIS — Z794 Long term (current) use of insulin: Secondary | ICD-10-CM | POA: Diagnosis not present

## 2018-12-04 DIAGNOSIS — I120 Hypertensive chronic kidney disease with stage 5 chronic kidney disease or end stage renal disease: Secondary | ICD-10-CM | POA: Diagnosis not present

## 2018-12-04 DIAGNOSIS — K5730 Diverticulosis of large intestine without perforation or abscess without bleeding: Secondary | ICD-10-CM | POA: Diagnosis not present

## 2018-12-04 DIAGNOSIS — E119 Type 2 diabetes mellitus without complications: Secondary | ICD-10-CM | POA: Diagnosis not present

## 2018-12-04 DIAGNOSIS — Z7902 Long term (current) use of antithrombotics/antiplatelets: Secondary | ICD-10-CM | POA: Diagnosis not present

## 2018-12-04 DIAGNOSIS — K7460 Unspecified cirrhosis of liver: Secondary | ICD-10-CM | POA: Diagnosis not present

## 2018-12-04 DIAGNOSIS — Z992 Dependence on renal dialysis: Secondary | ICD-10-CM | POA: Diagnosis not present

## 2018-12-04 DIAGNOSIS — Z951 Presence of aortocoronary bypass graft: Secondary | ICD-10-CM | POA: Diagnosis not present

## 2018-12-04 DIAGNOSIS — I132 Hypertensive heart and chronic kidney disease with heart failure and with stage 5 chronic kidney disease, or end stage renal disease: Secondary | ICD-10-CM | POA: Diagnosis not present

## 2018-12-04 DIAGNOSIS — Z955 Presence of coronary angioplasty implant and graft: Secondary | ICD-10-CM | POA: Diagnosis not present

## 2018-12-04 DIAGNOSIS — R935 Abnormal findings on diagnostic imaging of other abdominal regions, including retroperitoneum: Secondary | ICD-10-CM | POA: Diagnosis not present

## 2018-12-04 DIAGNOSIS — I10 Essential (primary) hypertension: Secondary | ICD-10-CM | POA: Diagnosis not present

## 2018-12-04 DIAGNOSIS — K219 Gastro-esophageal reflux disease without esophagitis: Secondary | ICD-10-CM | POA: Diagnosis not present

## 2018-12-04 DIAGNOSIS — D631 Anemia in chronic kidney disease: Secondary | ICD-10-CM | POA: Diagnosis not present

## 2018-12-04 DIAGNOSIS — J90 Pleural effusion, not elsewhere classified: Secondary | ICD-10-CM | POA: Diagnosis not present

## 2018-12-04 DIAGNOSIS — R188 Other ascites: Secondary | ICD-10-CM | POA: Diagnosis not present

## 2018-12-04 DIAGNOSIS — E871 Hypo-osmolality and hyponatremia: Secondary | ICD-10-CM | POA: Diagnosis not present

## 2018-12-04 DIAGNOSIS — N186 End stage renal disease: Secondary | ICD-10-CM | POA: Diagnosis not present

## 2018-12-04 DIAGNOSIS — I2510 Atherosclerotic heart disease of native coronary artery without angina pectoris: Secondary | ICD-10-CM | POA: Diagnosis not present

## 2018-12-04 DIAGNOSIS — N200 Calculus of kidney: Secondary | ICD-10-CM | POA: Diagnosis not present

## 2018-12-05 ENCOUNTER — Ambulatory Visit: Payer: Medicare Other | Admitting: Surgery

## 2018-12-05 DIAGNOSIS — R935 Abnormal findings on diagnostic imaging of other abdominal regions, including retroperitoneum: Secondary | ICD-10-CM | POA: Diagnosis not present

## 2018-12-05 DIAGNOSIS — I509 Heart failure, unspecified: Secondary | ICD-10-CM | POA: Diagnosis not present

## 2018-12-05 DIAGNOSIS — Z992 Dependence on renal dialysis: Secondary | ICD-10-CM | POA: Diagnosis not present

## 2018-12-05 DIAGNOSIS — N186 End stage renal disease: Secondary | ICD-10-CM | POA: Diagnosis not present

## 2018-12-05 DIAGNOSIS — R188 Other ascites: Secondary | ICD-10-CM | POA: Diagnosis not present

## 2018-12-05 DIAGNOSIS — K7460 Unspecified cirrhosis of liver: Secondary | ICD-10-CM | POA: Diagnosis not present

## 2018-12-06 ENCOUNTER — Encounter: Payer: Self-pay | Admitting: Intensive Care

## 2018-12-06 ENCOUNTER — Other Ambulatory Visit: Payer: Self-pay | Admitting: Gastroenterology

## 2018-12-06 ENCOUNTER — Other Ambulatory Visit: Payer: Self-pay

## 2018-12-06 ENCOUNTER — Inpatient Hospital Stay
Admission: EM | Admit: 2018-12-06 | Discharge: 2018-12-08 | DRG: 432 | Disposition: A | Discharge status: Home / Self Care | Payer: Medicare Other | Attending: Internal Medicine | Admitting: Internal Medicine

## 2018-12-06 ENCOUNTER — Emergency Department: Payer: Medicare Other

## 2018-12-06 DIAGNOSIS — E1122 Type 2 diabetes mellitus with diabetic chronic kidney disease: Secondary | ICD-10-CM | POA: Diagnosis present

## 2018-12-06 DIAGNOSIS — K219 Gastro-esophageal reflux disease without esophagitis: Secondary | ICD-10-CM | POA: Diagnosis present

## 2018-12-06 DIAGNOSIS — Z955 Presence of coronary angioplasty implant and graft: Secondary | ICD-10-CM

## 2018-12-06 DIAGNOSIS — I2510 Atherosclerotic heart disease of native coronary artery without angina pectoris: Secondary | ICD-10-CM | POA: Diagnosis present

## 2018-12-06 DIAGNOSIS — Z7902 Long term (current) use of antithrombotics/antiplatelets: Secondary | ICD-10-CM

## 2018-12-06 DIAGNOSIS — Z79899 Other long term (current) drug therapy: Secondary | ICD-10-CM

## 2018-12-06 DIAGNOSIS — I5022 Chronic systolic (congestive) heart failure: Secondary | ICD-10-CM | POA: Diagnosis present

## 2018-12-06 DIAGNOSIS — E871 Hypo-osmolality and hyponatremia: Secondary | ICD-10-CM | POA: Diagnosis present

## 2018-12-06 DIAGNOSIS — E119 Type 2 diabetes mellitus without complications: Secondary | ICD-10-CM

## 2018-12-06 DIAGNOSIS — Z951 Presence of aortocoronary bypass graft: Secondary | ICD-10-CM | POA: Diagnosis not present

## 2018-12-06 DIAGNOSIS — I10 Essential (primary) hypertension: Secondary | ICD-10-CM | POA: Diagnosis present

## 2018-12-06 DIAGNOSIS — Z794 Long term (current) use of insulin: Secondary | ICD-10-CM | POA: Diagnosis not present

## 2018-12-06 DIAGNOSIS — E785 Hyperlipidemia, unspecified: Secondary | ICD-10-CM | POA: Diagnosis present

## 2018-12-06 DIAGNOSIS — E876 Hypokalemia: Secondary | ICD-10-CM | POA: Diagnosis present

## 2018-12-06 DIAGNOSIS — Z20828 Contact with and (suspected) exposure to other viral communicable diseases: Secondary | ICD-10-CM | POA: Diagnosis present

## 2018-12-06 DIAGNOSIS — D631 Anemia in chronic kidney disease: Secondary | ICD-10-CM | POA: Diagnosis present

## 2018-12-06 DIAGNOSIS — E1151 Type 2 diabetes mellitus with diabetic peripheral angiopathy without gangrene: Secondary | ICD-10-CM | POA: Diagnosis present

## 2018-12-06 DIAGNOSIS — N186 End stage renal disease: Secondary | ICD-10-CM | POA: Diagnosis present

## 2018-12-06 DIAGNOSIS — K7460 Unspecified cirrhosis of liver: Secondary | ICD-10-CM | POA: Diagnosis present

## 2018-12-06 DIAGNOSIS — Z992 Dependence on renal dialysis: Secondary | ICD-10-CM

## 2018-12-06 DIAGNOSIS — R188 Other ascites: Secondary | ICD-10-CM | POA: Diagnosis present

## 2018-12-06 DIAGNOSIS — Z87891 Personal history of nicotine dependence: Secondary | ICD-10-CM

## 2018-12-06 DIAGNOSIS — E1139 Type 2 diabetes mellitus with other diabetic ophthalmic complication: Secondary | ICD-10-CM

## 2018-12-06 DIAGNOSIS — R935 Abnormal findings on diagnostic imaging of other abdominal regions, including retroperitoneum: Secondary | ICD-10-CM

## 2018-12-06 DIAGNOSIS — I25810 Atherosclerosis of coronary artery bypass graft(s) without angina pectoris: Secondary | ICD-10-CM | POA: Diagnosis present

## 2018-12-06 DIAGNOSIS — I132 Hypertensive heart and chronic kidney disease with heart failure and with stage 5 chronic kidney disease, or end stage renal disease: Secondary | ICD-10-CM | POA: Diagnosis present

## 2018-12-06 LAB — URINALYSIS, ROUTINE W REFLEX MICROSCOPIC
Bilirubin Urine: NEGATIVE
Glucose, UA: 50 mg/dL — AB
Ketones, ur: NEGATIVE mg/dL
Leukocytes,Ua: NEGATIVE
Nitrite: NEGATIVE
Protein, ur: 100 mg/dL — AB
Specific Gravity, Urine: 1.015 (ref 1.005–1.030)
Squamous Epithelial / HPF: NONE SEEN (ref 0–5)
pH: 5 (ref 5.0–8.0)

## 2018-12-06 LAB — CBC
HCT: 36.3 % — ABNORMAL LOW (ref 39.0–52.0)
Hemoglobin: 11.5 g/dL — ABNORMAL LOW (ref 13.0–17.0)
MCH: 30.7 pg (ref 26.0–34.0)
MCHC: 31.7 g/dL (ref 30.0–36.0)
MCV: 97.1 fL (ref 80.0–100.0)
Platelets: 223 10*3/uL (ref 150–400)
RBC: 3.74 MIL/uL — ABNORMAL LOW (ref 4.22–5.81)
RDW: 16.7 % — ABNORMAL HIGH (ref 11.5–15.5)
WBC: 8.1 10*3/uL (ref 4.0–10.5)
nRBC: 0 % (ref 0.0–0.2)

## 2018-12-06 LAB — TROPONIN I (HIGH SENSITIVITY)
Troponin I (High Sensitivity): 46 ng/L — ABNORMAL HIGH (ref <18)
Troponin I (High Sensitivity): 49 ng/L — ABNORMAL HIGH (ref <18)

## 2018-12-06 LAB — BASIC METABOLIC PANEL
Anion gap: 11 (ref 5–15)
BUN: 40 mg/dL — ABNORMAL HIGH (ref 8–23)
CO2: 26 mmol/L (ref 22–32)
Calcium: 8.7 mg/dL — ABNORMAL LOW (ref 8.9–10.3)
Chloride: 97 mmol/L — ABNORMAL LOW (ref 98–111)
Creatinine, Ser: 3.12 mg/dL — ABNORMAL HIGH (ref 0.61–1.24)
GFR calc Af Amer: 22 mL/min — ABNORMAL LOW (ref >60)
GFR calc non Af Amer: 19 mL/min — ABNORMAL LOW (ref >60)
Glucose, Bld: 237 mg/dL — ABNORMAL HIGH (ref 70–99)
Potassium: 3.4 mmol/L — ABNORMAL LOW (ref 3.5–5.1)
Sodium: 134 mmol/L — ABNORMAL LOW (ref 135–145)

## 2018-12-06 LAB — APTT: aPTT: 46 seconds — ABNORMAL HIGH (ref 24–36)

## 2018-12-06 LAB — PROTIME-INR
INR: 1.3 — ABNORMAL HIGH (ref 0.8–1.2)
Prothrombin Time: 15.7 seconds — ABNORMAL HIGH (ref 11.4–15.2)

## 2018-12-06 LAB — SARS CORONAVIRUS 2 BY RT PCR (HOSPITAL ORDER, PERFORMED IN ~~LOC~~ HOSPITAL LAB): SARS Coronavirus 2: NEGATIVE

## 2018-12-06 LAB — MAGNESIUM: Magnesium: 1.8 mg/dL (ref 1.7–2.4)

## 2018-12-06 MED ORDER — TRAZODONE HCL 50 MG PO TABS
50.0000 mg | ORAL_TABLET | Freq: Every evening | ORAL | Status: DC | PRN
  Administered 2018-12-06 – 2018-12-07 (×3): 50 mg via ORAL
  Filled 2018-12-06 (×5): qty 1

## 2018-12-06 MED ORDER — ATORVASTATIN CALCIUM 20 MG PO TABS
80.0000 mg | ORAL_TABLET | Freq: Every day | ORAL | Status: DC
  Administered 2018-12-07: 17:00:00 80 mg via ORAL
  Filled 2018-12-06 (×2): qty 4

## 2018-12-06 MED ORDER — INSULIN ASPART 100 UNIT/ML ~~LOC~~ SOLN
0.0000 [IU] | Freq: Four times a day (QID) | SUBCUTANEOUS | Status: DC
  Administered 2018-12-07: 1 [IU] via SUBCUTANEOUS
  Administered 2018-12-07: 01:00:00 2 [IU] via SUBCUTANEOUS
  Filled 2018-12-06 (×2): qty 1

## 2018-12-06 MED ORDER — ONDANSETRON HCL 4 MG/2ML IJ SOLN: 4.0000 mg | Freq: Four times a day (QID) | INTRAMUSCULAR | Status: DC | PRN

## 2018-12-06 MED ORDER — CLOPIDOGREL BISULFATE 75 MG PO TABS
75.0000 mg | ORAL_TABLET | Freq: Every day | ORAL | Status: DC
  Administered 2018-12-07 – 2018-12-08 (×2): 75 mg via ORAL
  Filled 2018-12-06 (×2): qty 1

## 2018-12-06 MED ORDER — TRAMADOL HCL 50 MG PO TABS
50.0000 mg | ORAL_TABLET | Freq: Two times a day (BID) | ORAL | Status: DC | PRN
  Administered 2018-12-06: 22:00:00 50 mg via ORAL
  Filled 2018-12-06: qty 1

## 2018-12-06 MED ORDER — LOSARTAN POTASSIUM 25 MG PO TABS
25.0000 mg | ORAL_TABLET | Freq: Every day | ORAL | Status: DC
  Administered 2018-12-07 – 2018-12-08 (×2): 25 mg via ORAL
  Filled 2018-12-06 (×2): qty 1

## 2018-12-06 MED ORDER — PANTOPRAZOLE SODIUM 40 MG PO TBEC
40.0000 mg | DELAYED_RELEASE_TABLET | Freq: Every day | ORAL | Status: DC
  Administered 2018-12-07 – 2018-12-08 (×2): 40 mg via ORAL
  Filled 2018-12-06 (×2): qty 1

## 2018-12-06 MED ORDER — TORSEMIDE 100 MG PO TABS
100.0000 mg | ORAL_TABLET | ORAL | Status: DC
  Filled 2018-12-06: qty 1

## 2018-12-06 MED ORDER — ISOSORBIDE MONONITRATE ER 30 MG PO TB24
60.0000 mg | ORAL_TABLET | Freq: Every day | ORAL | Status: DC
  Administered 2018-12-07 – 2018-12-08 (×2): 60 mg via ORAL
  Filled 2018-12-06 (×2): qty 2

## 2018-12-06 MED ORDER — SODIUM BICARBONATE 650 MG PO TABS
650.0000 mg | ORAL_TABLET | Freq: Two times a day (BID) | ORAL | Status: DC
  Administered 2018-12-07 – 2018-12-08 (×2): 650 mg via ORAL
  Filled 2018-12-06 (×2): qty 1

## 2018-12-06 MED ORDER — ACETAMINOPHEN 500 MG PO TABS
1000.0000 mg | ORAL_TABLET | Freq: Once | ORAL | Status: AC
  Administered 2018-12-06: 1000 mg via ORAL
  Filled 2018-12-06: qty 2

## 2018-12-06 MED ORDER — ACETAMINOPHEN 650 MG RE SUPP: 650.0000 mg | Freq: Four times a day (QID) | RECTAL | Status: DC | PRN

## 2018-12-06 MED ORDER — CALCIUM ACETATE (PHOS BINDER) 667 MG PO CAPS
667.0000 mg | ORAL_CAPSULE | Freq: Three times a day (TID) | ORAL | Status: DC
  Administered 2018-12-07: 667 mg via ORAL
  Filled 2018-12-06: qty 1

## 2018-12-06 MED ORDER — TAMSULOSIN HCL 0.4 MG PO CAPS
0.4000 mg | ORAL_CAPSULE | Freq: Every day | ORAL | Status: DC
  Administered 2018-12-07: 17:00:00 0.4 mg via ORAL
  Filled 2018-12-06: qty 1

## 2018-12-06 MED ORDER — ALPRAZOLAM 0.5 MG PO TABS
0.2500 mg | ORAL_TABLET | Freq: Every day | ORAL | Status: DC | PRN
  Administered 2018-12-06 – 2018-12-07 (×2): 0.5 mg via ORAL
  Filled 2018-12-06 (×3): qty 1

## 2018-12-06 MED ORDER — OXYCODONE HCL 5 MG PO TABS
5.0000 mg | ORAL_TABLET | ORAL | Status: DC | PRN
  Administered 2018-12-07 (×3): 5 mg via ORAL
  Filled 2018-12-06 (×3): qty 1

## 2018-12-06 MED ORDER — ACETAMINOPHEN 325 MG PO TABS: 650.0000 mg | ORAL_TABLET | Freq: Four times a day (QID) | ORAL | Status: DC | PRN

## 2018-12-06 MED ORDER — ONDANSETRON HCL 4 MG PO TABS: 4.0000 mg | ORAL_TABLET | Freq: Four times a day (QID) | ORAL | Status: DC | PRN

## 2018-12-06 MED ORDER — HEPARIN SODIUM (PORCINE) 5000 UNIT/ML IJ SOLN
5000.0000 [IU] | Freq: Three times a day (TID) | INTRAMUSCULAR | Status: DC
  Administered 2018-12-07: 07:00:00 5000 [IU] via SUBCUTANEOUS
  Filled 2018-12-06: qty 1

## 2018-12-06 NOTE — ED Notes (Signed)
Admitting MD at bedisde

## 2018-12-06 NOTE — ED Provider Notes (Signed)
[facility] Emergency Department Provider Note  ____________________________________________   First MD Initiated Contact with Patient 12/06/18 1846     (approximate)  I have reviewed the triage vital signs and the nursing notes.   HISTORY  Chief Complaint Chest Pain and Abdominal Pain    HPI Thomas [name] is a 73 y.o. male with hypertension, hyperlipidemia, ESRD on dialysis Monday, Wednesday, Friday who now presents with chest tightness and abdominal tightness.  Patient was recently diagnosed with cirrhosis.  He is not had further work-up.  There is an issue where the actually ultrasound it hurts hernias is that of his liver.  However he is cirrhosis has been getting worse with worsening ascites.  His abdomen is pressing up against his chest which making him feel short of breath although he is satting 100%.  He denies any fevers.  He also has worsening leg swelling.  Patient still does produce some urine.  He is on torsemide.  He denies any chest pain that is similar to his heart attacks in the past.  His abdominal distention has getting worse over the past few days, constant, nothing makes it better, nothing makes it worse.           Past Medical History:  Diagnosis Date   Allergy    Arthritis    CAD (coronary artery disease)    Chronic kidney disease    Diabetes mellitus without complication (HCC)    Dyspnea    Erosive esophagitis    ESRD (end stage renal disease) on dialysis ([place]) 2019   MWF, Left arm fistula   GERD (gastroesophageal reflux disease)    Gouty arthropathy    History of colonic polyps    History of kidney stones    History of MRSA infection    Hyperkalemia    Hyperlipidemia    Hypertension    Melanoma ([place])    Myocardial infarction ([place]) 1986, 2019   Peripheral vascular disease ([place])    Squamous cell cancer of external ear, right    07/2016   Trigger finger of left hand    Ureteral tumor      Patient Active Problem List   Diagnosis Date Noted   Gout 11/28/2018   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   End-stage renal disease ([place]) 06/19/2018   Dependence on hemodialysis ([place]) 03/27/2018   Panic attacks 03/27/2018   Other insomnia 03/27/2018   Acute respiratory failure ([place]) 02/24/2018   PAD (peripheral artery disease) ([place]) 11/17/2017   Chest pain 07/17/2017   Emphysema lung ([place]) 07/17/2017   Acute on chronic systolic CHF (congestive heart failure), NYHA class 2 ([place]) 06/22/2017   CKD (chronic kidney disease) stage 5, GFR less than 15 ml/min (HCC) 06/20/2017   Congestive heart failure ([place]) 06/10/2017   Acute on chronic renal failure ([place]) 05/24/2017   Abnormal ankle brachial index (ABI) 10/13/2016   Intermittent claudication ([place]) 09/30/2016   PVC (premature ventricular contraction) 09/30/2016   Hyperlipidemia 09/30/2016   Aortic atherosclerosis ([place]) 06/17/2016   Duodenitis 11/24/2014   Erosive esophagitis 11/24/2014   Personal history of methicillin resistant Staphylococcus aureus 11/24/2014   Melanoma of skin ([place]) 11/24/2014   Acquired trigger finger 11/24/2014   Neoplasm of genitourinary organs 11/24/2014   Type 1 diabetes mellitus ([place]) 09/26/2014   History of smoking 30 or more pack years 08/26/2009   Allergic rhinitis 08/06/2006   Arthritis due to gout 07/28/2006   History of colon polyps 05/09/2003   Coronary atherosclerosis of autologous vein bypass graft without  angina 06/09/1995   Hypertensive disorder 06/09/1995    Past Surgical History:  Procedure Laterality Date   A/V FISTULAGRAM Left 08/24/2018   Procedure: A/V FISTULAGRAM;  Surgeon: Katha Cabal, MD;  Location: [place] [facility];  Service: Cardiovascular;  Laterality: Left;   A/V SHUNT INTERVENTION Left 03/12/2018   Procedure: A/V SHUNT INTERVENTION;  Surgeon: Algernon Huxley, MD;  Location: [place] [facility];  Service: Cardiovascular;  Laterality: Left;    AV FISTULA PLACEMENT Left 12/07/2017   Procedure: ARTERIOVENOUS (AV) FISTULA CREATION ( BRACHIOBASILIC STAGE );  Surgeon: Algernon Huxley, MD;  Location: [facility] ORS;  Service: Vascular;  Laterality: Left;   CATARACT EXTRACTION     COLONOSCOPY WITH PROPOFOL N/A 11/06/2018   Procedure: COLONOSCOPY WITH PROPOFOL;  Surgeon: Lollie Sails, MD;  Location: [facility] ENDOSCOPY;  Service: Endoscopy;  Laterality: N/A;   CORONARY ARTERY BYPASS GRAFT  1996   CORONARY STENT INTERVENTION N/A 05/29/2017   Procedure: CORONARY STENT INTERVENTION;  Surgeon: Isaias Cowman, MD;  Location: [place] [facility];  Service: Cardiovascular;  Laterality: N/A;   DIALYSIS/PERMA CATHETER REMOVAL N/A 04/12/2018   Procedure: DIALYSIS/PERMA CATHETER REMOVAL;  Surgeon: Algernon Huxley, MD;  Location: [place] [facility];  Service: Cardiovascular;  Laterality: N/A;   ESOPHAGOGASTRODUODENOSCOPY (EGD) WITH PROPOFOL N/A 11/06/2018   Procedure: ESOPHAGOGASTRODUODENOSCOPY (EGD) WITH PROPOFOL;  Surgeon: Lollie Sails, MD;  [facility] ENDOSCOPY;  Service: Endoscopy;  Laterality: N/A;   EXCISION Ureteral Tumor  2010?   (benign) Dr. Quillian Quince   EYE SURGERY Bilateral    Cataract Extraction with IOL   LEFT HEART CATH AND CORONARY ANGIOGRAPHY N/A 10/18/2016   Procedure: Left Heart Cath and Coronary Angiography;  Surgeon: Isaias Cowman, MD;  Location: [place] [facility];  Service: Cardiovascular;  Laterality: N/A;   LEFT HEART CATH AND CORONARY ANGIOGRAPHY N/A 05/29/2017   Procedure: LEFT HEART CATH AND CORONARY ANGIOGRAPHY;  Surgeon: Isaias Cowman, MD;  Location: [place] [facility];  Service: Cardiovascular;  Laterality: N/A;   LOWER EXTREMITY ANGIOGRAPHY Right 10/31/2016   Procedure: Lower Extremity Angiography;  Surgeon: Algernon Huxley, MD;  Location: [place] [facility];  Service: Cardiovascular;  Laterality: Right;   LOWER EXTREMITY ANGIOGRAPHY Left 11/14/2016   Procedure: Lower Extremity Angiography;   Surgeon: Algernon Huxley, MD;  Location: [place] [facility];  Service: Cardiovascular;  Laterality: Left;   LOWER EXTREMITY INTERVENTION  11/14/2016   Procedure: Lower Extremity Intervention;  Surgeon: Algernon Huxley, MD;  Location: [place] [facility];  Service: Cardiovascular;;   open heart surgery     RETINAL DETACHMENT SURGERY Right November 2107    Prior to Admission medications   Medication Sig Start Date End Date Taking? Authorizing Provider  acetaminophen (TYLENOL) 500 MG tablet Take 500 mg by mouth every 8 (eight) hours as needed for mild pain or headache.    [provider]  ALPRAZolam Duanne Moron) 0.25 MG tablet Take 1-2 tablets (0.25-0.5 mg total) by mouth daily as needed for anxiety. 11/09/18   Birdie Sons, MD  atorvastatin (LIPITOR) 80 MG tablet TAKE 1 TABLET BY MOUTH ONCE DAILY 09/20/18   Birdie Sons, MD  B Complex-C-Folic Acid (RENA-VITE PO) Take by mouth daily.    [provider]  Blood Glucose Monitoring Suppl (ONE TOUCH ULTRA 2) w/Device KIT Use to check gluocose daily for type 2 diabetes 12/23/17   Birdie Sons, MD  calcium acetate (PHOSLO) 667 MG capsule TAKE 1 CAPSULE BY MOUTH 3 TIMES DAILY WITH  MEALS 04/16/18   Birdie Sons, MD  clopidogrel (PLAVIX) 75 MG tablet TAKE 1 TABLET BY MOUTH ONCE DAILY 11/19/18   Algernon Huxley, MD  glucose blood test strip Contour Ascensia test strips and lancets Use to check blood sugar daily for type 2 diabetes, E11.9. 12/22/17   Birdie Sons, MD  insulin degludec (TRESIBA FLEXTOUCH) 100 UNIT/ML SOPN FlexTouch Pen Inject 0.06 mLs (6 Units total) into the skin daily. Increase by 2 units a day if fasting glucose is over 200 03/13/18   Mayo, Pete Pelt, MD  isosorbide mononitrate (IMDUR) 60 MG 24 hr tablet TAKE 1 TABLET BY MOUTH ONCE DAILY 07/09/18   Birdie Sons, MD  LORazepam (ATIVAN) 0.5 MG tablet Take 1 tablet (0.5 mg total) by mouth at bedtime as needed for sleep. 03/27/18   Birdie Sons, MD  losartan (COZAAR) 25  MG tablet TAKE 1 TABLET BY MOUTH ONCE EVERY EVENING 11/09/18   Birdie Sons, MD  magnesium oxide (MAG-OX) 400 MG tablet Take 400 mg by mouth daily.    [provider]  metoprolol succinate (TOPROL-XL) 50 MG 24 hr tablet TAKE 1 TABLET BY MOUTH ONCE DAILY 07/09/18   Birdie Sons, MD  pantoprazole (PROTONIX) 40 MG tablet Take 1 tablet (40 mg total) by mouth daily. 04/26/18   Birdie Sons, MD  polycarbophil (FIBERCON) 625 MG tablet Take 625 mg by mouth as needed.    [provider]  promethazine (PHENERGAN) 25 MG tablet Take 25 mg by mouth every 6 (six) hours as needed for nausea or vomiting.    [provider]  sodium bicarbonate 650 MG tablet Take 650 mg by mouth 2 (two) times daily.    [provider]  tamsulosin (FLOMAX) 0.4 MG CAPS capsule TAKE 1 CAPSULE BY MOUTH ONCE DAILY 30 MINUTES AFTER LARGEST MEAL. 08/23/18   Birdie Sons, MD  torsemide (DEMADEX) 100 MG tablet Take 100 mg by mouth daily. Do not take on dialysis days    [provider]  traMADol (ULTRAM) 50 MG tablet Take 1 tablet (50 mg total) by mouth every 6 (six) hours as needed (Pain). 10/24/18   Birdie Sons, MD  traZODone (DESYREL) 50 MG tablet TAKE 1/2 TO 1 TABLET BY MOUTH AT [place] 06/07/18   Birdie Sons, MD    Allergies Ciprofloxacin, Hydralazine, and Trulicity [dulaglutide]  Family History  Problem Relation Age of Onset   Alzheimer's disease Mother    Alzheimer's disease Brother    Lung cancer Paternal Grandfather     Social History Social History   Tobacco Use   Smoking status: Former Smoker    Packs/day: 1.00    Years: 51.00    Pack years: 51.00    Types: Cigarettes    Quit date: 06/21/2017    Years since quitting: 1.4   Smokeless tobacco: Never Used   Tobacco comment: started smoking at age 60-25. Smoked for 50 years  Substance Use Topics   Alcohol use: Not Currently    Alcohol/week: 0.0 standard drinks    Frequency: Never    Drug use: No      Review of Systems Constitutional: No fever/chills Eyes: No visual changes. ENT: No sore throat. Cardiovascular: Denies chest pain. Respiratory: Positive shortness of breath Gastrointestinal: Positive abdominal distention without vomiting Genitourinary: Negative for dysuria. Musculoskeletal: Negative for back pain. Skin: Negative for rash. Neurological: Negative for headaches, focal weakness or numbness. All other ROS negative ____________________________________________   PHYSICAL EXAM:  VITAL SIGNS: ED Triage Vitals  Enc Vitals Group     BP 12/06/18 1659 133/90     Pulse Rate 12/06/18 1659 87     Resp 12/06/18 1659 (!) 22     Temp 12/06/18 1659 98.4 F (36.9 C)     Temp Source 12/06/18 1659 Oral     SpO2 12/06/18 1659 99 %     Weight 12/06/18 1700 162 lb (73.5 kg)     Height 12/06/18 1700 _0  (1.676 m)     Head Circumference --      Peak Flow --      Pain Score 12/06/18 1700 7     Pain Loc --      Pain Edu? --      Excl. in [place]? --     Constitutional: Alert and oriented. Well appearing and in no acute distress. Eyes: Conjunctivae are normal. EOMI. Head: Atraumatic. Nose: No congestion/rhinnorhea. Mouth/Throat: Mucous membranes are moist.   Neck: No stridor. Trachea Midline. FROM Cardiovascular: Normal rate, regular rhythm. Grossly normal heart sounds.  Good peripheral circulation. Respiratory: Normal respiratory effort.  No retractions. Lungs CTAB. Gastrointestinal: Distended abdomen, slightly tender, no abdominal bruits.  Musculoskeletal: No lower extremity tenderness nor edema.  No joint effusions. Neurologic:  Normal speech and language. No gross focal neurologic deficits are appreciated.  Skin:  Skin is warm, dry and intact. No rash noted. Psychiatric: Mood and affect are normal. Speech and behavior are normal. GU: Deferred   ____________________________________________   LABS (all labs ordered are listed, but only abnormal results  are displayed)  Labs Reviewed  BASIC METABOLIC PANEL - Abnormal; Notable for the following components:      Result Value   Sodium 134 (*)    Potassium 3.4 (*)    Chloride 97 (*)    Glucose, Bld 237 (*)    BUN 40 (*)    Creatinine, Ser 3.12 (*)    Calcium 8.7 (*)    GFR calc non Af Amer 19 (*)    GFR calc Af Amer 22 (*)    All other components within normal limits  CBC - Abnormal; Notable for the following components:   RBC 3.74 (*)    Hemoglobin 11.5 (*)    HCT 36.3 (*)    RDW 16.7 (*)    All other components within normal limits  PROTIME-INR - Abnormal; Notable for the following components:   Prothrombin Time 15.7 (*)    INR 1.3 (*)    All other components within normal limits  APTT - Abnormal; Notable for the following components:   aPTT 46 (*)    All other components within normal limits  TROPONIN I (HIGH SENSITIVITY) - Abnormal; Notable for the following components:   Troponin I (High Sensitivity) 46 (*)    All other components within normal limits  TROPONIN I (HIGH SENSITIVITY) - Abnormal; Notable for the following components:   Troponin I (High Sensitivity) 49 (*)    All other components within normal limits  MAGNESIUM   ____________________________________________   ED ECG REPORT I, Vanessa De Motte, the attending physician, personally viewed and interpreted this ECG.  EKG shows a bifascicular block with a right bundle branch block and a left posterior fascicular block rate of 85 occasional PVC, QTC elevation at 533, no ST elevation.  Compared this to his prior EKG which looks similar except that now he is having some PVCs. ____________________________________________  RADIOLOGY Robert Bellow, personally viewed and evaluated these images (plain  radiographs) as part of my medical decision making, as well as reviewing the written report by the radiologist.  ED MD interpretation: Chest x-ray with minimal right pleural effusion and possible edema  Official radiology  report(s): Dg Chest 2 View  Result Date: 12/06/2018 CLINICAL DATA:  Chest tightness for several weeks. EXAM: CHEST - 2 VIEW COMPARISON:  March 17, 2018 FINDINGS: Mediastinal contour is normal. Heart size is enlarged. Patient status post prior CABG and median sternotomy. Increased pulmonary interstitium is identified bilaterally. There is no focal pneumonia. Minimal right pleural effusion is identified. Bony structures are stable. IMPRESSION: Mild interstitial edema.  Minimal right pleural effusion. Electronically Signed   By: Abelardo Diesel M.D.   On: 12/06/2018 17:24    ____________________________________________   PROCEDURES  Procedure(s) performed (including Critical Care):  Procedures   ____________________________________________   INITIAL IMPRESSION / ASSESSMENT AND PLAN / ED COURSE  Thomas [name] was evaluated in Emergency Department on 12/06/2018 for the symptoms described in the history of present illness. He was evaluated in the context of the global COVID-19 pandemic, which necessitated consideration that the patient might be at risk for infection with the SARS-CoV-2 virus that causes COVID-19. Institutional protocols and algorithms that pertain to the evaluation of patients at risk for COVID-19 are in a state of rapid change based on information released by regulatory bodies including the CDC and federal and state organizations. These policies and algorithms were followed during the patient's care in the ED.    Patient is abdominal distention is most likely secondary to his cirrhosis was recently diagnosed and worsening ascites.  Per patient is had no imaging of his abdomen to rule out other acute processes.  Will get CT scan to further evaluate.  I suspect that this distention is causing him to feel short of breath however will get chest x-ray to evaluate for edema given patient is on dialysis.  Will also get cardiac markers but low suspicion for ACS.  Will get labs to  evaluate for anemia.  Although patient feels short of breath he is satting 100% and he is not in any respiratory distress.   Glucose is slightly elevated but no evidence of anion gap elevation.  Creatinine is 3.12 but patient is on dialysis.  White count is normal.  Hemoglobin is around patient's baseline.  Troponin is 46 but this could be elevated due to his dialysis and in the past his troponins have been in the 40s.  Chest x-ray showed mild interstitial edema and minimal right pleural effusion.  CT scan does show moderate volume ascites and small right pleural effusion.  There also concern for possible bladder infections will add on UA.  We will discussed with hospital team given patient needs a therapeutic paracentesis.   Patient has no white count, no fever night very low suspicion for SBP.  I discussed with hospital team if they would want to do a diagnostic paracentesis today or just wait for the therapeutic centesis tomorrow.  They are okay with holding off.  Patient is not any respiratory distress he satting 100%.  I feel he can wait for his paracentesis till tomorrow.  They will get patient for fluid overload as well as cirrhosis work-up.  They will send labs tomorrow.  Admit to the hospital team.  ______________________________   FINAL CLINICAL IMPRESSION(S) / ED DIAGNOSES   Final diagnoses:  Other ascites  Hepatic cirrhosis, unspecified hepatic cirrhosis type, unspecified whether ascites present ([place])      MEDICATIONS GIVEN  DURING THIS VISIT:  Medications  acetaminophen (TYLENOL) tablet 1,000 mg (1,000 mg Oral Given 12/06/18 2101)     ED Discharge Orders    None       Note:  This document was prepared using Dragon voice recognition software and may include unintentional dictation errors.   Vanessa [place], MD 12/06/18 2103

## 2018-12-06 NOTE — ED Notes (Signed)
Pt given meal tray and drink at this time. Ok per MD funke

## 2018-12-06 NOTE — ED Triage Notes (Addendum)
Patient c/o chest tightness ongoing for a couple weeks and abd pain/tightness. Patient abd is distended and hard. Dialysis patient M,W,F and starting 8/22 he will be having 2 hour sessions. Access on left arm. Swelling noted in bilateral legs. A&O x4 in triage. Takes blood thinners daily

## 2018-12-06 NOTE — ED Notes (Signed)
ED TO INPATIENT HANDOFF REPORT  ED Nurse Name and Phone #: Caryl Pina, RN, [place] Name/Age/Gender Thomas [name] 73 y.o. male Room/Bed: ED33A/ED33A  Code Status   Code Status: Prior  Home/SNF/Other Home Patient oriented to: self, place, time and situation Is this baseline? Yes   Triage Complete: Triage complete  Chief Complaint Chest tightness  Triage Note Patient c/o chest tightness ongoing for a couple weeks and abd pain/tightness. Patient abd is distended and hard. Dialysis patient M,W,F and starting 8/22 he will be having 2 hour sessions. Access on left arm. Swelling noted in bilateral legs. A&O x4 in triage. Takes blood thinners daily   Allergies Allergies  Allergen Reactions  . Ciprofloxacin Itching and Swelling    Facial swelling   . Hydralazine Nausea And Vomiting  . Trulicity [Dulaglutide] Other (See Comments)    Bloating    Level of Care/Admitting Diagnosis ED Disposition    ED Disposition Condition Comment   Admit  Hospital Area: [facility] [[id number]]  Level of Care: Med-Surg [16]  Covid Evaluation: Asymptomatic Screening Protocol (No Symptoms)  Diagnosis: Cirrhosis of liver with ascites [facility]) [[id number]]  Admitting Physician: Lance Coon [[id number]]  Attending Physician: Lance Coon [phone]  Estimated length of stay: past midnight tomorrow  Certification:: I certify this patient will need inpatient services for at least 2 midnights  PT Class (Do Not Modify): Inpatient [101]  PT Acc Code (Do Not Modify): Private [1]       B Medical/Surgery History Past Medical History:  Diagnosis Date  . Allergy   . Arthritis   . CAD (coronary artery disease)   . Chronic kidney disease   . Diabetes mellitus without complication ([place])   . Dyspnea   . Erosive esophagitis   . ESRD (end stage renal disease) on dialysis ([place]) 2019   MWF, Left arm fistula  . GERD (gastroesophageal reflux disease)   . Gouty arthropathy   . History of colonic  polyps   . History of kidney stones   . History of MRSA infection   . Hyperkalemia   . Hyperlipidemia   . Hypertension   . Melanoma ([place])   . Myocardial infarction ([place]) 1986, 2019  . Peripheral vascular disease ([place])   . 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 Trigger finger of left hand   . Ureteral tumor    Past Surgical History:  Procedure Laterality Date  . A/V FISTULAGRAM Left 08/24/2018   Procedure: A/V FISTULAGRAM;  Surgeon: Katha Cabal, MD;  Location: [place] [facility];  Service: Cardiovascular;  Laterality: Left;  . A/V SHUNT INTERVENTION Left 03/12/2018   Procedure: A/V SHUNT INTERVENTION;  Surgeon: Algernon Huxley, MD;  Location: [place] [facility];  Service: Cardiovascular;  Laterality: Left;  . AV FISTULA PLACEMENT Left 12/07/2017   Procedure: ARTERIOVENOUS (AV) FISTULA CREATION ( BRACHIOBASILIC STAGE );  Surgeon: Algernon Huxley, MD;  Location: [facility] ORS;  Service: Vascular;  Laterality: Left;  . CATARACT EXTRACTION    . COLONOSCOPY WITH PROPOFOL N/A 11/06/2018   Procedure: COLONOSCOPY WITH PROPOFOL;  Surgeon: Lollie Sails, MD;  Location: [facility] ENDOSCOPY;  Service: Endoscopy;  Laterality: N/A;  . CORONARY ARTERY BYPASS GRAFT  1996  . CORONARY STENT INTERVENTION N/A 05/29/2017   Procedure: CORONARY STENT INTERVENTION;  Surgeon: Isaias Cowman, MD;  Location: [place] [facility];  Service: Cardiovascular;  Laterality: N/A;  . DIALYSIS/PERMA CATHETER REMOVAL N/A 04/12/2018   Procedure: DIALYSIS/PERMA CATHETER REMOVAL;  Surgeon: Algernon Huxley, MD;  Location: [place] [facility];  Service: Cardiovascular;  Laterality: N/A;  . ESOPHAGOGASTRODUODENOSCOPY (EGD) WITH PROPOFOL N/A 11/06/2018   Procedure: ESOPHAGOGASTRODUODENOSCOPY (EGD) WITH PROPOFOL;  Surgeon: Lollie Sails, MD;  Location: [facility] ENDOSCOPY;  Service: Endoscopy;  Laterality: N/A;  . EXCISION Ureteral Tumor  2010?   (benign) Dr. Quillian Quince  . EYE SURGERY Bilateral    Cataract  Extraction with IOL  . LEFT HEART CATH AND CORONARY ANGIOGRAPHY N/A 10/18/2016   Procedure: Left Heart Cath and Coronary Angiography;  Surgeon: Isaias Cowman, MD;  Location: [place] [facility];  Service: Cardiovascular;  Laterality: N/A;  . LEFT HEART CATH AND CORONARY ANGIOGRAPHY N/A 05/29/2017   Procedure: LEFT HEART CATH AND CORONARY ANGIOGRAPHY;  Surgeon: Isaias Cowman, MD;  Location: [place] [facility];  Service: Cardiovascular;  Laterality: N/A;  . LOWER EXTREMITY ANGIOGRAPHY Right 10/31/2016   Procedure: Lower Extremity Angiography;  Surgeon: Algernon Huxley, MD;  Location: [place] [facility];  Service: Cardiovascular;  Laterality: Right;  . LOWER EXTREMITY ANGIOGRAPHY Left 11/14/2016   Procedure: Lower Extremity Angiography;  Surgeon: Algernon Huxley, MD;  Location: [place] [facility];  Service: Cardiovascular;  Laterality: Left;  . LOWER EXTREMITY INTERVENTION  11/14/2016   Procedure: Lower Extremity Intervention;  Surgeon: Algernon Huxley, MD;  Location: [place] [facility];  Service: Cardiovascular;;  . open heart surgery    . RETINAL DETACHMENT SURGERY Right November 2107     A IV Location/Drains/Wounds Patient Lines/Drains/Airways Status   Active Line/Drains/Airways    Name:   Placement date:   Placement time:   Site:   Days:   Peripheral IV 12/06/18 Right Antecubital   12/06/18    2011    Antecubital   less than 1   Fistula / Graft Left Upper arm Arteriovenous fistula   08/24/18    1543    Upper arm   104   Hemodialysis Catheter Right Subclavian Double-lumen   03/12/18    1400    Subclavian   269   Sheath 08/24/18 Left Venous   08/24/18    1500    Venous   104          Intake/Output Last 24 hours No intake or output data in the 24 hours ending 12/06/18 2317  Labs/Imaging Results for orders placed or performed during the hospital encounter of 12/06/18 (from the past 48 hour(s))  Basic metabolic panel     Status: Abnormal   Collection Time: 12/06/18  5:03 PM   Result Value Ref Range   Sodium 134 (L) 135 - 145 mmol/L   Potassium 3.4 (L) 3.5 - 5.1 mmol/L   Chloride 97 (L) 98 - 111 mmol/L   CO2 26 22 - 32 mmol/L   Glucose, Bld 237 (H) 70 - 99 mg/dL   BUN 40 (H) 8 - 23 mg/dL   Creatinine, Ser 3.12 (H) 0.61 - 1.24 mg/dL   Calcium 8.7 (L) 8.9 - 10.3 mg/dL   GFR calc non Af Amer 19 (L) >60 mL/min   GFR calc Af Amer 22 (L) >60 mL/min   Anion gap 11 5 - 15    Comment: Performed at [facility], [address] [place], [place] [address]  CBC     Status: Abnormal   Collection Time: 12/06/18  5:03 PM  Result Value Ref Range   WBC 8.1 4.0 - 10.5 K/uL   RBC 3.74 (L) 4.22 - 5.81 MIL/uL   Hemoglobin 11.5 (L)  13.0 - 17.0 g/dL   HCT 36.3 (L) 39.0 - 52.0 %   MCV 97.1 80.0 - 100.0 fL   MCH 30.7 26.0 - 34.0 pg   MCHC 31.7 30.0 - 36.0 g/dL   RDW 16.7 (H) 11.5 - 15.5 %   Platelets 223 150 - 400 K/uL   nRBC 0.0 0.0 - 0.2 %    Comment: Performed at [facility], [place], [place] [address]  Troponin I (High Sensitivity)     Status: Abnormal   Collection Time: 12/06/18  5:03 PM  Result Value Ref Range   Troponin I (High Sensitivity) 46 (H) <18 ng/L    Comment: (NOTE) Elevated high sensitivity troponin I (hsTnI) values and significant  changes across serial measurements may suggest ACS but many other  chronic and acute conditions are known to elevate hsTnI results.  Refer to the "Links" section for chest pain algorithms and additional  guidance. Performed at [facility], [address] [place], [place] [address]   Protime-INR     Status: Abnormal   Collection Time: 12/06/18  7:17 PM  Result Value Ref Range   Prothrombin Time 15.7 (H) 11.4 - 15.2 seconds   INR 1.3 (H) 0.8 - 1.2    Comment: (NOTE) INR goal varies based on device and disease states. Performed at [facility], [place], [place] [address]   Troponin I (High Sensitivity)     Status: Abnormal   Collection Time:  12/06/18  7:17 PM  Result Value Ref Range   Troponin I (High Sensitivity) 49 (H) <18 ng/L    Comment: (NOTE) Elevated high sensitivity troponin I (hsTnI) values and significant  changes across serial measurements may suggest ACS but many other  chronic and acute conditions are known to elevate hsTnI results.  Refer to the "Links" section for chest pain algorithms and additional  guidance. Performed at [facility], [address] [place], [place] [address]   Magnesium     Status: None   Collection Time: 12/06/18  7:17 PM  Result Value Ref Range   Magnesium 1.8 1.7 - 2.4 mg/dL    Comment: Performed at [facility], [address] [place], [place] [address]  APTT     Status: Abnormal   Collection Time: 12/06/18  7:17 PM  Result Value Ref Range   aPTT 46 (H) 24 - 36 seconds    Comment:        IF BASELINE aPTT IS ELEVATED, SUGGEST PATIENT RISK ASSESSMENT BE USED TO DETERMINE APPROPRIATE ANTICOAGULANT THERAPY. Performed at [facility], [address] [place], [place] [address]   Urinalysis, Routine w reflex microscopic     Status: Abnormal   Collection Time: 12/06/18  8:25 PM  Result Value Ref Range   Color, Urine YELLOW (A) YELLOW   APPearance HAZY (A) CLEAR   Specific Gravity, Urine 1.015 1.005 - 1.030   pH 5.0 5.0 - 8.0   Glucose, UA 50 (A) NEGATIVE mg/dL   Hgb urine dipstick SMALL (A) NEGATIVE   Bilirubin Urine NEGATIVE NEGATIVE   Ketones, ur NEGATIVE NEGATIVE mg/dL   Protein, ur 100 (A) NEGATIVE mg/dL   Nitrite NEGATIVE NEGATIVE   Leukocytes,Ua NEGATIVE NEGATIVE   RBC / HPF 0-5 0 - 5 RBC/hpf   WBC, UA 0-5 0 - 5 WBC/hpf   Bacteria, UA RARE (A) NONE SEEN   Squamous Epithelial / LPF NONE SEEN 0 - 5   Mucus PRESENT  Hyaline Casts, UA PRESENT     Comment: Performed at [facility], [address] [place], [place] [address]  SARS Coronavirus 2 [facility] order, Performed in [facility] hospital lab) Nasopharyngeal Nasopharyngeal  Swab     Status: None   Collection Time: 12/06/18  8:55 PM   Specimen: Nasopharyngeal Swab  Result Value Ref Range   SARS Coronavirus 2 NEGATIVE NEGATIVE    Comment: (NOTE) If result is NEGATIVE SARS-CoV-2 target nucleic acids are NOT DETECTED. The SARS-CoV-2 RNA is generally detectable in upper and lower  respiratory specimens during the acute phase of infection. The lowest  concentration of SARS-CoV-2 viral copies this assay can detect is 250  copies / mL. A negative result does not preclude SARS-CoV-2 infection  and should not be used as the sole basis for treatment or other  patient management decisions.  A negative result may occur with  improper specimen collection / handling, submission of specimen other  than nasopharyngeal swab, presence of viral mutation(s) within the  areas targeted by this assay, and inadequate number of viral copies  (<250 copies / mL). A negative result must be combined with clinical  observations, patient history, and epidemiological information. If result is POSITIVE SARS-CoV-2 target nucleic acids are DETECTED. The SARS-CoV-2 RNA is generally detectable in upper and lower  respiratory specimens dur ing the acute phase of infection.  Positive  results are indicative of active infection with SARS-CoV-2.  Clinical  correlation with patient history and other diagnostic information is  necessary to determine patient infection status.  Positive results do  not rule out bacterial infection or co-infection with other viruses. If result is PRESUMPTIVE POSTIVE SARS-CoV-2 nucleic acids MAY BE PRESENT.   A presumptive positive result was obtained on the submitted specimen  and confirmed on repeat testing.  While 2019 novel coronavirus  (SARS-CoV-2) nucleic acids may be present in the submitted sample  additional confirmatory testing may be necessary for epidemiological  and / or clinical management purposes  to differentiate between  SARS-CoV-2 and other  Sarbecovirus currently known to infect humans.  If clinically indicated additional testing with an alternate test  methodology [phone]) is advised. The SARS-CoV-2 RNA is generally  detectable in upper and lower respiratory sp ecimens during the acute  phase of infection. The expected result is Negative. Fact Sheet for Patients:  StrictlyIdeas.no Fact Sheet for Healthcare Providers: BankingDealers.co.za This test is not yet approved or cleared by the [place] FDA and has been authorized for detection and/or diagnosis of SARS-CoV-2 by FDA under an Emergency Use Authorization (EUA).  This EUA will remain in effect (meaning this test can be used) for the duration of the COVID-19 declaration under Section 564(b)(1) of the Act, 21 U.S.C. section 360bbb-3(b)(1), unless the authorization is terminated or revoked sooner. Performed at [facility], [place], [place] [address]    Ct Abdomen Pelvis Wo Contrast  Result Date: 12/06/2018 CLINICAL DATA:  Abdominal distension and pain, ongoing chest tightness EXAM: CT ABDOMEN AND PELVIS WITHOUT CONTRAST TECHNIQUE: Multidetector CT imaging of the abdomen and pelvis was performed following the standard protocol without IV contrast. COMPARISON:  CT 03/09/2018 FINDINGS: Lower chest: Small right pleural effusion with adjacent areas of passive atelectasis. Cardiomegaly. Coronary calcifications. Hepatobiliary: No focal hepatic lesions. Some mild lobulation to the liver surface contour. No calcified gallstones. No biliary ductal dilatation. Mild gallbladder wall thickening, nonspecific in the setting of ascites. Pancreas: Unremarkable. No pancreatic ductal dilatation or surrounding inflammatory changes. Spleen: Normal  in size without focal abnormality. Adrenals/Urinary Tract: Normal adrenal glands. Mild bilateral nonspecific perinephric stranding, a nonspecific finding though may correlate  with either age or decreased renal function. Few punctate nonobstructing calculi in both kidneys. No concerning renal lesions. No hydronephrosis or obstructive urolithiasis. Urinary bladder is circumferentially thickened, greater than expected for the degree of underdistention. Stomach/Bowel: Distal esophagus, stomach and duodenal sweep are unremarkable. No bowel wall thickening or dilatation. No evidence of obstruction. Scattered colonic diverticula without focal pericolonic inflammation to suggest diverticulitis. Vascular/Lymphatic: Atherosclerotic plaque within the normal caliber aorta. Right proximal superficial femoral artery stent graft is noted. No suspicious or enlarged lymph nodes in the included lymphatic chains. Reproductive: Coarse eccentric calcification of the prostate. No concerning abnormalities of the prostate or seminal vesicles. Other: Moderate volume ascites, increased from comparison studies. Multiple fat containing ventral hernias in the periumbilical and supraumbilical regions, with stable fascial defects but now containing some ascitic fluid. Circumferential body wall edema is noted. Additional soft tissue edema tracks inferiorly to the external genitalia Musculoskeletal: No acute osseous abnormality or suspicious osseous lesion. IMPRESSION: 1. Moderate volume ascites, increased from comparison studies. Fluid sterility not ascertained on imaging. 2. Questionable nodularity of hepatic surface, correlate for intrinsic liver disease. 3. Circumferentially thickened urinary bladder wall, greater than expected for the degree of underdistention. Correlate with urinalysis to exclude cystitis. 4. Multiple fat containing ventral hernias in the periumbilical and supraumbilical regions, with stable fascial defects but now containing some ascitic fluid. 5. Small right pleural effusion with adjacent areas of passive atelectasis. 6. Aortic Atherosclerosis (ICD10-I70.0). Electronically Signed   By: Lovena Le M.D.   On: 12/06/2018 19:45   Dg Chest 2 View  Result Date: 12/06/2018 CLINICAL DATA:  Chest tightness for several weeks. EXAM: CHEST - 2 VIEW COMPARISON:  March 17, 2018 FINDINGS: Mediastinal contour is normal. Heart size is enlarged. Patient status post prior CABG and median sternotomy. Increased pulmonary interstitium is identified bilaterally. There is no focal pneumonia. Minimal right pleural effusion is identified. Bony structures are stable. IMPRESSION: Mild interstitial edema.  Minimal right pleural effusion. Electronically Signed   By: Abelardo Diesel M.D.   On: 12/06/2018 17:24    Pending Labs Unresulted Labs (From admission, onward)    Start     Ordered   Signed and Held  CBC  (heparin)  Once,   R    Comments: Baseline for heparin therapy IF NOT ALREADY DRAWN.  Notify MD if PLT < 100 K.    Signed and Held   Signed and Held  Creatinine, serum  (heparin)  Once,   R    Comments: Baseline for heparin therapy IF NOT ALREADY DRAWN.    Signed and Held   Signed and Held  Comprehensive metabolic panel  Tomorrow morning,   R     Signed and Held   Signed and Held  CBC  Tomorrow morning,   R     Signed and Held          Vitals/Pain Today's Vitals   12/06/18 1700 12/06/18 1834 12/06/18 1835 12/06/18 2300  BP:  (!) 151/62  (!) 132/94  Pulse:   84 81  Resp:   18 20  Temp:      TempSrc:      SpO2:   100% 94%  Weight: 73.5 kg     Height: 5\' 6"  (1.676 m)     PainSc: 7        Isolation Precautions No active isolations  Medications Medications  ALPRAZolam Duanne Moron) tablet 0.25-0.5 mg (0.5 mg Oral Given 12/06/18 2227)  traZODone (DESYREL) tablet 50 mg (50 mg Oral Given 12/06/18 2227)  traMADol (ULTRAM) tablet 50 mg (50 mg Oral Given 12/06/18 2227)  acetaminophen (TYLENOL) tablet 1,000 mg (1,000 mg Oral Given 12/06/18 2101)    Mobility walks Low fall risk   Focused Assessments N/A   R Recommendations: See Admitting Provider Note  Report given to:   Additional  Notes: NPO at [facility]; Paracentesis scheduled for tomorrow.

## 2018-12-06 NOTE — H&P (Signed)
[facility] NAME: Thomas [name]    MR#:  [id number]  DATE OF BIRTH:  Apr 03, 1946  DATE OF ADMISSION:  12/06/2018  PRIMARY CARE PHYSICIAN: Birdie Sons, MD   REQUESTING/REFERRING PHYSICIAN: Jari Pigg, MD  CHIEF COMPLAINT:   Chief Complaint  Patient presents with  . Chest Pain  . Abdominal Pain    HISTORY OF PRESENT ILLNESS:  Thomas [name]  is a 72 y.o. male who presents with chief complaint as above.  Patient presents the ED with a complaint of abdominal distention and tenderness as well as some shortness of breath.  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  He states that this ascites buildup has happened over the last 4 days or so.  He is never had a paracentesis before.  He has been worked up in the outpatient setting with Dr. Gustavo Lah for his cirrhosis.  However, he states that it got better after tonight that he could not wait for further outpatient appointments.  Hospitalist were called for admission  PAST MEDICAL HISTORY:   Past Medical History:  Diagnosis Date  . Allergy   . Arthritis   . CAD (coronary artery disease)   . Chronic kidney disease   . Diabetes mellitus without complication ([place])   . Dyspnea   . Erosive esophagitis   . ESRD (end stage renal disease) on dialysis ([place]) 2019   MWF, Left arm fistula  . GERD (gastroesophageal reflux disease)   . Gouty arthropathy   . History of colonic polyps   . History of kidney stones   . History of MRSA infection   . Hyperkalemia   . Hyperlipidemia   . Hypertension   . Melanoma ([place])   . Myocardial infarction ([place]) 1986, 2019  . Peripheral vascular disease ([place])   . 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 Trigger finger of left hand   . Ureteral tumor      PAST SURGICAL HISTORY:   Past Surgical History:  Procedure Laterality Date  . A/V FISTULAGRAM Left 08/24/2018   Procedure: A/V FISTULAGRAM;  Surgeon:  Katha Cabal, MD;  Location: [place] [facility];  Service: Cardiovascular;  Laterality: Left;  . A/V SHUNT INTERVENTION Left 03/12/2018   Procedure: A/V SHUNT INTERVENTION;  Surgeon: Algernon Huxley, MD;  Location: [place] [facility];  Service: Cardiovascular;  Laterality: Left;  . AV FISTULA PLACEMENT Left 12/07/2017   Procedure: ARTERIOVENOUS (AV) FISTULA CREATION ( BRACHIOBASILIC STAGE );  Surgeon: Algernon Huxley, MD;  Location: [facility];  Service: Vascular;  Laterality: Left;  . CATARACT EXTRACTION    . COLONOSCOPY WITH PROPOFOL N/A 11/06/2018   Procedure: COLONOSCOPY WITH PROPOFOL;  Surgeon: Lollie Sails, MD;  [facility];  Service: Endoscopy;  Laterality: N/A;  . CORONARY ARTERY BYPASS GRAFT  1996  . CORONARY STENT INTERVENTION N/A 05/29/2017   Procedure: CORONARY STENT INTERVENTION;  Surgeon: Isaias Cowman, MD;  Location: [place] [facility];  Service: Cardiovascular;  Laterality: N/A;  . DIALYSIS/PERMA CATHETER REMOVAL N/A 04/12/2018   Procedure: DIALYSIS/PERMA CATHETER REMOVAL;  Surgeon: Algernon Huxley, MD;  Location: [place] [facility];  Service: Cardiovascular;  Laterality: N/A;  . ESOPHAGOGASTRODUODENOSCOPY (EGD) WITH PROPOFOL N/A 11/06/2018   Procedure: ESOPHAGOGASTRODUODENOSCOPY (EGD) WITH PROPOFOL;  Surgeon: Lollie Sails, MD;  Location: [facility] ENDOSCOPY;  Service: Endoscopy;  Laterality: N/A;  . EXCISION Ureteral Tumor  2010?   (benign) Dr. Quillian Quince  .  EYE SURGERY Bilateral    Cataract Extraction with IOL  . LEFT HEART CATH AND CORONARY ANGIOGRAPHY N/A 10/18/2016   Procedure: Left Heart Cath and Coronary Angiography;  Surgeon: Isaias Cowman, MD;  Location: [place] [facility];  Service: Cardiovascular;  Laterality: N/A;  . LEFT HEART CATH AND CORONARY ANGIOGRAPHY N/A 05/29/2017   Procedure: LEFT HEART CATH AND CORONARY ANGIOGRAPHY;  Surgeon: Isaias Cowman, MD;  Location: [place] [facility];  Service: Cardiovascular;  Laterality: N/A;   . LOWER EXTREMITY ANGIOGRAPHY Right 10/31/2016   Procedure: Lower Extremity Angiography;  Surgeon: Algernon Huxley, MD;  Location: [place] [facility];  Service: Cardiovascular;  Laterality: Right;  . LOWER EXTREMITY ANGIOGRAPHY Left 11/14/2016   Procedure: Lower Extremity Angiography;  Surgeon: Algernon Huxley, MD;  Location: [place] [facility];  Service: Cardiovascular;  Laterality: Left;  . LOWER EXTREMITY INTERVENTION  11/14/2016   Procedure: Lower Extremity Intervention;  Surgeon: Algernon Huxley, MD;  Location: [place] [facility];  Service: Cardiovascular;;  . open heart surgery    . RETINAL DETACHMENT SURGERY Right November 2107     SOCIAL HISTORY:   Social History   Tobacco Use  . Smoking status: Former Smoker    Packs/day: 1.00    Years: 51.00    Pack years: 51.00    Types: Cigarettes    Quit date: 06/21/2017    Years since quitting: 1.4  . Smokeless tobacco: Never Used  . Tobacco comment: started smoking at age [age]. Smoked for 50 years  Substance Use Topics  . Alcohol use: Not Currently    Alcohol/week: 0.0 standard drinks    Frequency: Never     FAMILY HISTORY:   Family History  Problem Relation Age of Onset  . Alzheimer's disease Mother   . Alzheimer's disease Brother   . Lung cancer Paternal Grandfather      DRUG ALLERGIES:   Allergies  Allergen Reactions  . Ciprofloxacin Itching and Swelling    Facial swelling   . Hydralazine Nausea And Vomiting  . Trulicity [Dulaglutide] Other (See Comments)    Bloating    MEDICATIONS AT HOME:   Prior to Admission medications   Medication Sig Start Date End Date Taking? Authorizing Provider  ALPRAZolam (XANAX) 0.25 MG tablet Take 1-2 tablets (0.25-0.5 mg total) by mouth daily as needed for anxiety. 11/09/18  Yes Birdie Sons, MD  atorvastatin (LIPITOR) 80 MG tablet TAKE 1 TABLET BY MOUTH ONCE DAILY 09/20/18  Yes Birdie Sons, MD  B Complex-C-Folic Acid (RENA-VITE PO) Take by mouth daily.   Yes [provider]  calcium acetate (PHOSLO) 667 MG capsule TAKE 1 CAPSULE BY MOUTH 3 TIMES DAILY WITH MEALS 04/16/18  Yes Birdie Sons, MD  clopidogrel (PLAVIX) 75 MG tablet TAKE 1 TABLET BY MOUTH ONCE DAILY 11/19/18  Yes Dew, Erskine Squibb, MD  insulin degludec (TRESIBA FLEXTOUCH) 100 UNIT/ML SOPN FlexTouch Pen Inject 0.06 mLs (6 Units total) into the skin daily. Increase by 2 units a day if fasting glucose is over 200 03/13/18  Yes Mayo, Pete Pelt, MD  isosorbide mononitrate (IMDUR) 60 MG 24 hr tablet TAKE 1 TABLET BY MOUTH ONCE DAILY 07/09/18  Yes Birdie Sons, MD  LORazepam (ATIVAN) 0.5 MG tablet Take 1 tablet (0.5 mg total) by mouth at bedtime as needed for sleep. 03/27/18  Yes Birdie Sons, MD  losartan (COZAAR) 25 MG tablet TAKE 1 TABLET BY MOUTH ONCE EVERY EVENING 11/09/18  Yes Birdie Sons, MD  magnesium oxide (MAG-OX) 400 MG tablet Take 400 mg by mouth daily.   Yes [provider]  metoprolol succinate (TOPROL-XL) 50 MG 24 hr tablet TAKE 1 TABLET BY MOUTH ONCE DAILY 07/09/18  Yes Birdie Sons, MD  pantoprazole (PROTONIX) 40 MG tablet Take 1 tablet (40 mg total) by mouth daily. 04/26/18  Yes Birdie Sons, MD  tamsulosin (FLOMAX) 0.4 MG CAPS capsule TAKE 1 CAPSULE BY MOUTH ONCE DAILY 30 MINUTES AFTER LARGEST MEAL. 08/23/18  Yes Birdie Sons, MD  torsemide (DEMADEX) 100 MG tablet Take 100 mg by mouth daily. Do not take on dialysis days   Yes [provider]  traMADol (ULTRAM) 50 MG tablet Take 1 tablet (50 mg total) by mouth every 6 (six) hours as needed (Pain). 10/24/18  Yes Birdie Sons, MD  traZODone (DESYREL) 50 MG tablet TAKE 1/2 TO 1 TABLET BY MOUTH AT [facility] NEEDED SLEEP 06/07/18  Yes Birdie Sons, MD  acetaminophen (TYLENOL) 500 MG tablet Take 500 mg by mouth every 8 (eight) hours as needed for mild pain or headache.    [provider]  Blood Glucose Monitoring Suppl (ONE TOUCH ULTRA 2) w/Device KIT Use to check gluocose daily for type 2  diabetes 12/23/17   Birdie Sons, MD  glucose blood test strip Contour [employer]ia test strips and lancets Use to check blood sugar daily for type 2 diabetes, E11.9. 12/22/17   Birdie Sons, MD  polycarbophil (FIBERCON) 625 MG tablet Take 625 mg by mouth as needed.    [provider]  promethazine (PHENERGAN) 25 MG tablet Take 25 mg by mouth every 6 (six) hours as needed for nausea or vomiting.    [provider]  sodium bicarbonate 650 MG tablet Take 650 mg by mouth 2 (two) times daily.    [provider]    REVIEW OF SYSTEMS:  Review of Systems  Constitutional: Negative for chills, fever, malaise/fatigue and weight loss.  HENT: Negative for ear pain, hearing loss and tinnitus.   Eyes: Negative for blurred vision, double vision, pain and redness.  Respiratory: Negative for cough, hemoptysis and shortness of breath.   Cardiovascular: Negative for chest pain, palpitations, orthopnea and leg swelling.  Gastrointestinal: Positive for abdominal pain. Negative for constipation, diarrhea, nausea and vomiting.  Genitourinary: Negative for dysuria, frequency and hematuria.  Musculoskeletal: Negative for back pain, joint pain and neck pain.  Skin:       No acne, rash, or lesions  Neurological: Negative for dizziness, tremors, focal weakness and weakness.  Endo/Heme/Allergies: Negative for polydipsia. Does not bruise/bleed easily.  Psychiatric/Behavioral: Negative for depression. The patient is not nervous/anxious and does not have insomnia.      VITAL SIGNS:   Vitals:   12/06/18 1659 12/06/18 1700 12/06/18 1834 12/06/18 1835  BP: 133/90  (!) 151/62   Pulse: 87   84  Resp: (!) 22   18  Temp: 98.4 F (36.9 C)     TempSrc: Oral     SpO2: 99%   100%  Weight:  73.5 kg    Height:  5' 6"  (1.676 m)     Wt Readings from Last 3 Encounters:  12/06/18 73.5 kg  11/28/18 73.4 kg  11/06/18 73 kg    PHYSICAL EXAMINATION:  Physical Exam  Vitals  reviewed. Constitutional: He is oriented to person, place, and time. He appears well-developed and well-nourished. No distress.  HENT:  Head: Normocephalic and atraumatic.  Mouth/Throat: Oropharynx is clear and moist.  Eyes: Pupils are equal, round, and reactive to light. Conjunctivae and EOM are normal. No scleral icterus.  Neck: Normal range of motion. Neck supple. No JVD present. No thyromegaly present.  Cardiovascular: Normal rate, regular rhythm and intact distal pulses. Exam reveals no gallop and no friction rub.  No murmur heard. Respiratory: Effort normal and breath sounds normal. No respiratory distress. He has no wheezes. He has no rales.  GI: Soft. Bowel sounds are normal. He exhibits distension. There is no abdominal tenderness.  Musculoskeletal: Normal range of motion.        General: No edema.     Comments: No arthritis, no gout  Lymphadenopathy:    He has no cervical adenopathy.  Neurological: He is alert and oriented to person, place, and time. No cranial nerve deficit.  No dysarthria, no aphasia  Skin: Skin is warm and dry. No rash noted. No erythema.  Psychiatric: He has a normal mood and affect. His behavior is normal. Judgment and thought content normal.    LABORATORY PANEL:   CBC Recent Labs  Lab 12/06/18 1703  WBC 8.1  HGB 11.5*  HCT 36.3*  PLT 223   ------------------------------------------------------------------------------------------------------------------  Chemistries  Recent Labs  Lab 12/06/18 1703 12/06/18 1917  NA 134*  --   K 3.4*  --   CL 97*  --   CO2 26  --   GLUCOSE 237*  --   BUN 40*  --   CREATININE 3.12*  --   CALCIUM 8.7*  --   MG  --  1.8   ------------------------------------------------------------------------------------------------------------------  Cardiac Enzymes No results for input(s): TROPONINI in the last 168  hours. ------------------------------------------------------------------------------------------------------------------  RADIOLOGY:  Ct Abdomen Pelvis Wo Contrast  Result Date: 12/06/2018 CLINICAL DATA:  Abdominal distension and pain, ongoing chest tightness EXAM: CT ABDOMEN AND PELVIS WITHOUT CONTRAST TECHNIQUE: Multidetector CT imaging of the abdomen and pelvis was performed following the standard protocol without IV contrast. COMPARISON:  CT 03/09/2018 FINDINGS: Lower chest: Small right pleural effusion with adjacent areas of passive atelectasis. Cardiomegaly. Coronary calcifications. Hepatobiliary: No focal hepatic lesions. Some mild lobulation to the liver surface contour. No calcified gallstones. No biliary ductal dilatation. Mild gallbladder wall thickening, nonspecific in the setting of ascites. Pancreas: Unremarkable. No pancreatic ductal dilatation or surrounding inflammatory changes. Spleen: Normal in size without focal abnormality. Adrenals/Urinary Tract: Normal adrenal glands. Mild bilateral nonspecific perinephric stranding, a nonspecific finding though may correlate with either age or decreased renal function. Few punctate nonobstructing calculi in both kidneys. No concerning renal lesions. No hydronephrosis or obstructive urolithiasis. Urinary bladder is circumferentially thickened, greater than expected for the degree of underdistention. Stomach/Bowel: Distal esophagus, stomach and duodenal sweep are unremarkable. No bowel wall thickening or dilatation. No evidence of obstruction. Scattered colonic diverticula without focal pericolonic inflammation to suggest diverticulitis. Vascular/Lymphatic: Atherosclerotic plaque within the normal caliber aorta. Right proximal superficial femoral artery stent graft is noted. No suspicious or enlarged lymph nodes in the included lymphatic chains. Reproductive: Coarse eccentric calcification of the prostate. No concerning abnormalities of the prostate or  seminal vesicles. Other: Moderate volume ascites, increased from comparison studies. Multiple fat containing ventral hernias in the periumbilical and supraumbilical regions, with stable fascial defects but now containing some ascitic fluid. Circumferential body wall edema is noted. Additional soft tissue edema tracks inferiorly to the external genitalia Musculoskeletal: No acute osseous abnormality or suspicious osseous lesion. IMPRESSION: 1. Moderate volume ascites, increased from comparison studies. Fluid sterility not ascertained on imaging. 2. Questionable nodularity of hepatic surface, correlate  for intrinsic liver disease. 3. Circumferentially thickened urinary bladder wall, greater than expected for the degree of underdistention. Correlate with urinalysis to exclude cystitis. 4. Multiple fat containing ventral hernias in the periumbilical and supraumbilical regions, with stable fascial defects but now containing some ascitic fluid. 5. Small right pleural effusion with adjacent areas of passive atelectasis. 6. Aortic Atherosclerosis (ICD10-I70.0). Electronically Signed   By: Lovena Le M.D.   On: 12/06/2018 19:45   Dg Chest 2 View  Result Date: 12/06/2018 CLINICAL DATA:  Chest tightness for several weeks. EXAM: CHEST - 2 VIEW COMPARISON:  March 17, 2018 FINDINGS: Mediastinal contour is normal. Heart size is enlarged. Patient status post prior CABG and median sternotomy. Increased pulmonary interstitium is identified bilaterally. There is no focal pneumonia. Minimal right pleural effusion is identified. Bony structures are stable. IMPRESSION: Mild interstitial edema.  Minimal right pleural effusion. Electronically Signed   By: Abelardo Diesel M.D.   On: 12/06/2018 17:24    EKG:   Orders placed or performed during the hospital encounter of 12/06/18  . ED EKG  . ED EKG  . EKG 12-Lead  . EKG 12-Lead    IMPRESSION AND PLAN:  Principal Problem:   Cirrhosis of liver with ascites ([place]) -patient  likely needs paracentesis with therapeutic and diagnostic tap tomorrow. 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 Labs, imaging and/or ECG reviewed by provider and discussed with patient/family. Management plans discussed with the patient and/or family.  COVID-19 status: Pending  DVT PROPHYLAXIS: SubQ heparin  GI PROPHYLAXIS:  PPI   ADMISSION STATUS: Inpatient     CODE STATUS: Full Code Status History    Date Active Date Inactive Code Status Order ID Comments User Context   03/17/2018 1033 03/18/2018 1611 Full Code [id number]  Saundra Shelling, MD Inpatient   03/09/2018 0233 03/13/2018 1726 Full Code [id number]  Arta Silence, MD Inpatient   02/24/2018 0450 02/25/2018 1419 Full Code [id number]  Amelia Jo, MD Inpatient   07/17/2017 0059 07/18/2017 1826 Full Code [id number]  Amelia Jo, MD Inpatient   06/10/2017 1610 06/14/2017 1308 Full Code [id number]  Henreitta Leber, MD Inpatient   05/25/2017 0024 05/30/2017 1325 Full Code [id number]  Lance Coon, MD Inpatient   11/14/2016 0938 11/14/2016 1442 Full Code [id number]  Algernon Huxley, MD Inpatient   10/31/2016 1121 10/31/2016 1817 Full Code [id number]  Algernon Huxley, MD Inpatient   10/18/2016 1357 10/18/2016 1850 Full Code [id number]  Isaias Cowman, MD Inpatient   Advance Care Planning Activity      TOTAL TIME TAKING CARE OF THIS PATIENT: 45 minutes.   This patient was evaluated in the context of the global COVID-19 pandemic, which necessitated consideration that the patient might be at risk for infection with the  SARS-CoV-2 virus that causes COVID-19. Institutional protocols and algorithms that pertain to the evaluation of patients at risk for COVID-19 are in a state of rapid change based on information released by regulatory bodies including the CDC and federal and state organizations. These policies and algorithms were followed to the best of this provider's knowledge to date during the patient's care at this  facility.  Ethlyn Daniels 12/06/2018, 9:08 PM  [facility]  Office  [phone]  CC: Primary care physician; Birdie Sons, MD  Note:  This document was prepared using Dragon voice recognition software and may include unintentional dictation errors.

## 2018-12-07 ENCOUNTER — Inpatient Hospital Stay: Payer: Medicare Other

## 2018-12-07 LAB — BODY FLUID CELL COUNT WITH DIFFERENTIAL
Eos, Fluid: 0 %
Lymphs, Fluid: 66 %
Monocyte-Macrophage-Serous Fluid: 20 %
Neutrophil Count, Fluid: 14 %
Other Cells, Fluid: 0 %
Total Nucleated Cell Count, Fluid: 211 cu mm

## 2018-12-07 LAB — LACTATE DEHYDROGENASE, PLEURAL OR PERITONEAL FLUID: LD, Fluid: 72 U/L — ABNORMAL HIGH (ref 3–23)

## 2018-12-07 LAB — GLUCOSE, CAPILLARY
Glucose-Capillary: 144 mg/dL — ABNORMAL HIGH (ref 70–99)
Glucose-Capillary: 154 mg/dL — ABNORMAL HIGH (ref 70–99)
Glucose-Capillary: 165 mg/dL — ABNORMAL HIGH (ref 70–99)
Glucose-Capillary: 218 mg/dL — ABNORMAL HIGH (ref 70–99)
Glucose-Capillary: 94 mg/dL (ref 70–99)

## 2018-12-07 LAB — CBC
HCT: 34.3 % — ABNORMAL LOW (ref 39.0–52.0)
Hemoglobin: 11.3 g/dL — ABNORMAL LOW (ref 13.0–17.0)
MCH: 30.8 pg (ref 26.0–34.0)
MCHC: 32.9 g/dL (ref 30.0–36.0)
MCV: 93.5 fL (ref 80.0–100.0)
Platelets: 214 10*3/uL (ref 150–400)
RBC: 3.67 MIL/uL — ABNORMAL LOW (ref 4.22–5.81)
RDW: 16.6 % — ABNORMAL HIGH (ref 11.5–15.5)
WBC: 7.4 10*3/uL (ref 4.0–10.5)
nRBC: 0 % (ref 0.0–0.2)

## 2018-12-07 LAB — PROTEIN, PLEURAL OR PERITONEAL FLUID: Total protein, fluid: 3.6 g/dL

## 2018-12-07 LAB — COMPREHENSIVE METABOLIC PANEL
ALT: 16 U/L (ref 0–44)
AST: 21 U/L (ref 15–41)
Albumin: 2.6 g/dL — ABNORMAL LOW (ref 3.5–5.0)
Alkaline Phosphatase: 240 U/L — ABNORMAL HIGH (ref 38–126)
Anion gap: 7 (ref 5–15)
BUN: 45 mg/dL — ABNORMAL HIGH (ref 8–23)
CO2: 26 mmol/L (ref 22–32)
Calcium: 8.6 mg/dL — ABNORMAL LOW (ref 8.9–10.3)
Chloride: 101 mmol/L (ref 98–111)
Creatinine, Ser: 3.21 mg/dL — ABNORMAL HIGH (ref 0.61–1.24)
GFR calc Af Amer: 21 mL/min — ABNORMAL LOW (ref >60)
GFR calc non Af Amer: 18 mL/min — ABNORMAL LOW (ref >60)
Glucose, Bld: 139 mg/dL — ABNORMAL HIGH (ref 70–99)
Potassium: 3.4 mmol/L — ABNORMAL LOW (ref 3.5–5.1)
Sodium: 134 mmol/L — ABNORMAL LOW (ref 135–145)
Total Bilirubin: 0.9 mg/dL (ref 0.3–1.2)
Total Protein: 6.4 g/dL — ABNORMAL LOW (ref 6.5–8.1)

## 2018-12-07 LAB — GLUCOSE, PLEURAL OR PERITONEAL FLUID: Glucose, Fluid: 113 mg/dL

## 2018-12-07 MED ORDER — POTASSIUM CHLORIDE 20 MEQ PO PACK
20.0000 meq | PACK | Freq: Once | ORAL | Status: AC
  Administered 2018-12-07: 17:00:00 20 meq via ORAL
  Filled 2018-12-07: qty 1

## 2018-12-07 MED ORDER — INSULIN ASPART 100 UNIT/ML ~~LOC~~ SOLN: 0.0000 [IU] | Freq: Three times a day (TID) | SUBCUTANEOUS | Status: DC

## 2018-12-07 MED ORDER — POTASSIUM CHLORIDE 20 MEQ PO PACK: 40.0000 meq | PACK | Freq: Once | ORAL | Status: DC

## 2018-12-07 NOTE — Progress Notes (Signed)
Established hemodialysis patient known at [facility] Leona Singleton) MWF 11:00. Patients family transports to to and from treatments. Please contact me directly with any dialysis placement concerns.  Elvera Bicker Dialysis Coordinator [phone]

## 2018-12-07 NOTE — Progress Notes (Signed)
[facility]  ROUNDING NOTE   Subjective:   [name] admitted to [facility] on 12/06/2018 for Other ascites [R18.8] Hepatic cirrhosis, unspecified hepatic cirrhosis type, unspecified whether ascites present [facility]) [K74.60]  Patient states the ascites is causing him to be short of breath.   Objective:  Vital signs in last 24 hours:  Temp:  [97.6 F (36.4 C)-98.4 F (36.9 C)] 97.8 F (36.6 C) (08/21 0915) Pulse Rate:  [64-87] 64 (08/21 1000) Resp:  [14-22] 14 (08/21 1000) BP: (129-156)/(62-112) 133/107 (08/21 1000) SpO2:  [94 %-100 %] 96 % (08/21 1000) Weight:  [73.5 kg-75.8 kg] 75.8 kg (08/21 0500)  Weight change:  Filed Weights   12/06/18 1700 12/06/18 2350 12/07/18 0500  Weight: 73.5 kg 75.8 kg 75.8 kg    Intake/Output: No intake/output data recorded.   Intake/Output this shift:  No intake/output data recorded.  Physical Exam: General: NAD, laying in bed  Head: Normocephalic, atraumatic. Moist oral mucosal membranes  Eyes: Anicteric, PERRL  Neck: Supple, trachea midline  Lungs:  Clear to auscultation  Heart: Regular rate and rhythm  Abdomen:  +ascites, +ventral hernia  Extremities: + peripheral edema.  Neurologic: Nonfocal, moving all four extremities  Skin: No lesions  Access: Left AVF    Basic Metabolic Panel: Recent Labs  Lab 12/06/18 1703 12/06/18 1917 12/07/18 0637  NA 134*  --  134*  K 3.4*  --  3.4*  CL 97*  --  101  CO2 26  --  26  GLUCOSE 237*  --  139*  BUN 40*  --  45*  CREATININE 3.12*  --  3.21*  CALCIUM 8.7*  --  8.6*  MG  --  1.8  --     Liver Function Tests: Recent Labs  Lab 12/07/18 0637  AST 21  ALT 16  ALKPHOS 240*  BILITOT 0.9  PROT 6.4*  ALBUMIN 2.6*   No results for input(s): LIPASE, AMYLASE in the last 168 hours. No results for input(s): AMMONIA in the last 168 hours.  CBC: Recent Labs  Lab 12/06/18 1703 12/07/18 0637  WBC 8.1 7.4  HGB 11.5* 11.3*  HCT 36.3* 34.3*  MCV 97.1 93.5  PLT 223  214    Cardiac Enzymes: No results for input(s): CKTOTAL, CKMB, CKMBINDEX, TROPONINI in the last 168 hours.  BNP: Invalid input(s): POCBNP  CBG: Recent Labs  Lab 12/07/18 0053 12/07/18 0558  GLUCAP 165* 144*    Microbiology: Results for orders placed or performed during the hospital encounter of 12/06/18  SARS Coronavirus 2 [facility] order, Performed in [facility] hospital lab) Nasopharyngeal Nasopharyngeal Swab     Status: None   Collection Time: 12/06/18  8:55 PM   Specimen: Nasopharyngeal Swab  Result Value Ref Range Status   SARS Coronavirus 2 NEGATIVE NEGATIVE Final    Comment: (NOTE) If result is NEGATIVE SARS-CoV-2 target nucleic acids are NOT DETECTED. The SARS-CoV-2 RNA is generally detectable in upper and lower  respiratory specimens during the acute phase of infection. The lowest  concentration of SARS-CoV-2 viral copies this assay can detect is 250  copies / mL. A negative result does not preclude SARS-CoV-2 infection  and should not be used as the sole basis for treatment or other  patient management decisions.  A negative result may occur with  improper specimen collection / handling, submission of specimen other  than nasopharyngeal swab, presence of viral mutation(s) within the  areas targeted by this assay, and inadequate number of viral copies  (<250  copies / mL). A negative result must be combined with clinical  observations, patient history, and epidemiological information. If result is POSITIVE SARS-CoV-2 target nucleic acids are DETECTED. The SARS-CoV-2 RNA is generally detectable in upper and lower  respiratory specimens dur ing the acute phase of infection.  Positive  results are indicative of active infection with SARS-CoV-2.  Clinical  correlation with patient history and other diagnostic information is  necessary to determine patient infection status.  Positive results do  not rule out bacterial infection or co-infection with other  viruses. If result is PRESUMPTIVE POSTIVE SARS-CoV-2 nucleic acids MAY BE PRESENT.   A presumptive positive result was obtained on the submitted specimen  and confirmed on repeat testing.  While 2019 novel coronavirus  (SARS-CoV-2) nucleic acids may be present in the submitted sample  additional confirmatory testing may be necessary for epidemiological  and / or clinical management purposes  to differentiate between  SARS-CoV-2 and other Sarbecovirus currently known to infect humans.  If clinically indicated additional testing with an alternate test  methodology [phone]) is advised. The SARS-CoV-2 RNA is generally  detectable in upper and lower respiratory sp ecimens during the acute  phase of infection. The expected result is Negative. Fact Sheet for Patients:  StrictlyIdeas.no Fact Sheet for Healthcare Providers: BankingDealers.co.za This test is not yet approved or cleared by the [place] FDA and has been authorized for detection and/or diagnosis of SARS-CoV-2 by FDA under an Emergency Use Authorization (EUA).  This EUA will remain in effect (meaning this test can be used) for the duration of the COVID-19 declaration under Section 564(b)(1) of the Act, 21 U.S.C. section 360bbb-3(b)(1), unless the authorization is terminated or revoked sooner. Performed at [facility], [address] [place], [place] [address]     Coagulation Studies: Recent Labs    12/06/18 1917  LABPROT 15.7*  INR 1.3*    Urinalysis: Recent Labs    12/06/18 2025  COLORURINE YELLOW*  LABSPEC 1.015  PHURINE 5.0  GLUCOSEU 50*  HGBUR SMALL*  BILIRUBINUR NEGATIVE  KETONESUR NEGATIVE  PROTEINUR 100*  NITRITE NEGATIVE  LEUKOCYTESUR NEGATIVE      Imaging: Ct Abdomen Pelvis Wo Contrast  Result Date: 12/06/2018 CLINICAL DATA:  Abdominal distension and pain, ongoing chest tightness EXAM: CT ABDOMEN AND PELVIS WITHOUT CONTRAST TECHNIQUE:  Multidetector CT imaging of the abdomen and pelvis was performed following the standard protocol without IV contrast. COMPARISON:  CT 03/09/2018 FINDINGS: Lower chest: Small right pleural effusion with adjacent areas of passive atelectasis. Cardiomegaly. Coronary calcifications. Hepatobiliary: No focal hepatic lesions. Some mild lobulation to the liver surface contour. No calcified gallstones. No biliary ductal dilatation. Mild gallbladder wall thickening, nonspecific in the setting of ascites. Pancreas: Unremarkable. No pancreatic ductal dilatation or surrounding inflammatory changes. Spleen: Normal in size without focal abnormality. Adrenals/Urinary Tract: Normal adrenal glands. Mild bilateral nonspecific perinephric stranding, a nonspecific finding though may correlate with either age or decreased renal function. Few punctate nonobstructing calculi in both kidneys. No concerning renal lesions. No hydronephrosis or obstructive urolithiasis. Urinary bladder is circumferentially thickened, greater than expected for the degree of underdistention. Stomach/Bowel: Distal esophagus, stomach and duodenal sweep are unremarkable. No bowel wall thickening or dilatation. No evidence of obstruction. Scattered colonic diverticula without focal pericolonic inflammation to suggest diverticulitis. Vascular/Lymphatic: Atherosclerotic plaque within the normal caliber aorta. Right proximal superficial femoral artery stent graft is noted. No suspicious or enlarged lymph nodes in the included lymphatic chains. Reproductive: Coarse eccentric calcification of the prostate. No concerning abnormalities  of the prostate or seminal vesicles. Other: Moderate volume ascites, increased from comparison studies. Multiple fat containing ventral hernias in the periumbilical and supraumbilical regions, with stable fascial defects but now containing some ascitic fluid. Circumferential body wall edema is noted. Additional soft tissue edema tracks  inferiorly to the external genitalia Musculoskeletal: No acute osseous abnormality or suspicious osseous lesion. IMPRESSION: 1. Moderate volume ascites, increased from comparison studies. Fluid sterility not ascertained on imaging. 2. Questionable nodularity of hepatic surface, correlate for intrinsic liver disease. 3. Circumferentially thickened urinary bladder wall, greater than expected for the degree of underdistention. Correlate with urinalysis to exclude cystitis. 4. Multiple fat containing ventral hernias in the periumbilical and supraumbilical regions, with stable fascial defects but now containing some ascitic fluid. 5. Small right pleural effusion with adjacent areas of passive atelectasis. 6. Aortic Atherosclerosis (ICD10-I70.0). Electronically Signed   By: Lovena Le M.D.   On: 12/06/2018 19:45   Dg Chest 2 View  Result Date: 12/06/2018 CLINICAL DATA:  Chest tightness for several weeks. EXAM: CHEST - 2 VIEW COMPARISON:  March 17, 2018 FINDINGS: Mediastinal contour is normal. Heart size is enlarged. Patient status post prior CABG and median sternotomy. Increased pulmonary interstitium is identified bilaterally. There is no focal pneumonia. Minimal right pleural effusion is identified. Bony structures are stable. IMPRESSION: Mild interstitial edema.  Minimal right pleural effusion. Electronically Signed   By: Abelardo Diesel M.D.   On: 12/06/2018 17:24     Medications:    . atorvastatin  80 mg Oral Daily  . calcium acetate  667 mg Oral TID WC  . clopidogrel  75 mg Oral Daily  . heparin  5,000 Units Subcutaneous Q8H  . insulin aspart  0-9 Units Subcutaneous Q6H  . isosorbide mononitrate  60 mg Oral Daily  . losartan  25 mg Oral Daily  . pantoprazole  40 mg Oral Daily  . sodium bicarbonate  650 mg Oral BID  . tamsulosin  0.4 mg Oral QPC supper  . [START ON 12/08/2018] torsemide  100 mg Oral Once per day on Sun Tue Thu Sat   acetaminophen **OR** acetaminophen, ALPRAZolam, ondansetron  **OR** ondansetron (ZOFRAN) IV, oxyCODONE, traMADol, traZODone  Assessment/ Plan:  Mr. AKIM WATKINSON is a 73 y.o. white male with end stage renal disease, diabetes mellitus, hypertension, coronary artery disease status CABG, peripheral arterial disease, congestive heart failure. Admitted to [facility] on 12/06/2018 for Other ascites [R18.8] Hepatic cirrhosis, unspecified hepatic cirrhosis type, unspecified whether ascites present ([place]) [K74.60]  Scheduled for large volume paracentesis today.   [facility] 46 kg Left AVF  1. End stage renal disease. Seen and examined on hemodialysis treatment. Tolerating treatment well.   2. Hypertension: at goal but with significant peripheral edema. Home regimen of losartan and tamsulosin  3. Diabetes mellitus type II with chronic kidney disease: history of poor control.   4. Anemia of chronic kidney disease: hemoglobin 11.3 - EPO at outpatient.    LOS: 1 Ariyanah Aguado 8/21/202010:52 AM

## 2018-12-07 NOTE — Progress Notes (Addendum)
[facility] [place] NAME: Thomas [name]    MR#:  [id number]  DATE OF BIRTH:  03-Jan-1946  SUBJECTIVE:  CHIEF COMPLAINT:   Chief Complaint  Patient presents with   Chest Pain   Abdominal Pain   Abdominal pain and distention.  Leg swelling. REVIEW OF SYSTEMS:  Review of Systems  Constitutional: Negative for chills, fever and malaise/fatigue.  HENT: Negative for sore throat.   Eyes: Negative for blurred vision and double vision.  Respiratory: Negative for cough, hemoptysis, shortness of breath, wheezing and stridor.   Cardiovascular: Positive for leg swelling. Negative for chest pain, palpitations and orthopnea.  Gastrointestinal: Positive for abdominal pain. Negative for blood in stool, diarrhea, melena, nausea and vomiting.  Genitourinary: Negative for dysuria, flank pain and hematuria.  Musculoskeletal: Negative for back pain and joint pain.  Skin: Negative for rash.  Neurological: Negative for dizziness, sensory change, focal weakness, seizures, loss of consciousness, weakness and headaches.  Endo/Heme/Allergies: Negative for polydipsia.  Psychiatric/Behavioral: Negative for depression. The patient is not nervous/anxious.     DRUG ALLERGIES:   Allergies  Allergen Reactions   Ciprofloxacin Itching and Swelling    Facial swelling    Hydralazine Nausea And Vomiting   Trulicity [Dulaglutide] Other (See Comments)    Bloating   VITALS:  Blood pressure (!) 145/69, pulse 84, temperature 98.1 F (36.7 C), temperature source Oral, resp. rate 16, height 5\' 6"  (1.676 m), weight 75.8 kg, SpO2 96 %. PHYSICAL EXAMINATION:  Physical Exam Constitutional:      General: He is not in acute distress. HENT:     Head: Normocephalic.     Mouth/Throat:     Mouth: Mucous membranes are moist.  Eyes:     General: No scleral icterus.    Conjunctiva/sclera: Conjunctivae normal.     Pupils: Pupils are equal, round, and reactive to light.    Neck:     Musculoskeletal: Normal range of motion and neck supple.     Vascular: No JVD.     Trachea: No tracheal deviation.  Cardiovascular:     Rate and Rhythm: Normal rate and regular rhythm.     Heart sounds: Normal heart sounds. No murmur. No gallop.   Pulmonary:     Effort: Pulmonary effort is normal. No respiratory distress.     Breath sounds: Normal breath sounds. No wheezing or rales.  Abdominal:     General: Bowel sounds are normal. There is distension.     Palpations: Abdomen is soft.     Tenderness: There is no abdominal tenderness. There is no rebound.     Comments: Positive ascites sign  Musculoskeletal: Normal range of motion.        General: No tenderness.     Right lower leg: Edema present.     Left lower leg: Edema present.  Skin:    Findings: No erythema or rash.  Neurological:     Mental Status: He is alert and oriented to person, place, and time.     Cranial Nerves: No cranial nerve deficit.  Psychiatric:        Mood and Affect: Mood normal.    LABORATORY PANEL:  Male CBC Recent Labs  Lab 12/07/18 0637  WBC 7.4  HGB 11.3*  HCT 34.3*  PLT 214   ------------------------------------------------------------------------------------------------------------------ Chemistries  Recent Labs  Lab 12/06/18 1917 12/07/18 0637  NA  --  134*  K  --  3.4*  CL  --  101  CO2  --  26  GLUCOSE  --  139*  BUN  --  45*  CREATININE  --  3.21*  CALCIUM  --  8.6*  MG 1.8  --   AST  --  21  ALT  --  16  ALKPHOS  --  240*  BILITOT  --  0.9   RADIOLOGY:  Ct Abdomen Pelvis Wo Contrast  Result Date: 12/06/2018 CLINICAL DATA:  Abdominal distension and pain, ongoing chest tightness EXAM: CT ABDOMEN AND PELVIS WITHOUT CONTRAST TECHNIQUE: Multidetector CT imaging of the abdomen and pelvis was performed following the standard protocol without IV contrast. COMPARISON:  CT 03/09/2018 FINDINGS: Lower chest: Small right pleural effusion with adjacent areas of passive  atelectasis. Cardiomegaly. Coronary calcifications. Hepatobiliary: No focal hepatic lesions. Some mild lobulation to the liver surface contour. No calcified gallstones. No biliary ductal dilatation. Mild gallbladder wall thickening, nonspecific in the setting of ascites. Pancreas: Unremarkable. No pancreatic ductal dilatation or surrounding inflammatory changes. Spleen: Normal in size without focal abnormality. Adrenals/Urinary Tract: Normal adrenal glands. Mild bilateral nonspecific perinephric stranding, a nonspecific finding though may correlate with either age or decreased renal function. Few punctate nonobstructing calculi in both kidneys. No concerning renal lesions. No hydronephrosis or obstructive urolithiasis. Urinary bladder is circumferentially thickened, greater than expected for the degree of underdistention. Stomach/Bowel: Distal esophagus, stomach and duodenal sweep are unremarkable. No bowel wall thickening or dilatation. No evidence of obstruction. Scattered colonic diverticula without focal pericolonic inflammation to suggest diverticulitis. Vascular/Lymphatic: Atherosclerotic plaque within the normal caliber aorta. Right proximal superficial femoral artery stent graft is noted. No suspicious or enlarged lymph nodes in the included lymphatic chains. Reproductive: Coarse eccentric calcification of the prostate. No concerning abnormalities of the prostate or seminal vesicles. Other: Moderate volume ascites, increased from comparison studies. Multiple fat containing ventral hernias in the periumbilical and supraumbilical regions, with stable fascial defects but now containing some ascitic fluid. Circumferential body wall edema is noted. Additional soft tissue edema tracks inferiorly to the external genitalia Musculoskeletal: No acute osseous abnormality or suspicious osseous lesion. IMPRESSION: 1. Moderate volume ascites, increased from comparison studies. Fluid sterility not ascertained on imaging.  2. Questionable nodularity of hepatic surface, correlate for intrinsic liver disease. 3. Circumferentially thickened urinary bladder wall, greater than expected for the degree of underdistention. Correlate with urinalysis to exclude cystitis. 4. Multiple fat containing ventral hernias in the periumbilical and supraumbilical regions, with stable fascial defects but now containing some ascitic fluid. 5. Small right pleural effusion with adjacent areas of passive atelectasis. 6. Aortic Atherosclerosis (ICD10-I70.0). Electronically Signed   By: Lovena Le M.D.   On: 12/06/2018 19:45   Dg Chest 2 View  Result Date: 12/06/2018 CLINICAL DATA:  Chest tightness for several weeks. EXAM: CHEST - 2 VIEW COMPARISON:  March 17, 2018 FINDINGS: Mediastinal contour is normal. Heart size is enlarged. Patient status post prior CABG and median sternotomy. Increased pulmonary interstitium is identified bilaterally. There is no focal pneumonia. Minimal right pleural effusion is identified. Bony structures are stable. IMPRESSION: Mild interstitial edema.  Minimal right pleural effusion. Electronically Signed   By: Abelardo Diesel M.D.   On: 12/06/2018 17:24   US Paracentesis  Result Date: 12/07/2018 INDICATION: 73 year old with ascites. EXAM: ULTRASOUND GUIDED PARACENTESIS MEDICATIONS: None. COMPLICATIONS: None immediate. PROCEDURE: Informed written consent was obtained from the patient after a discussion of the risks, benefits and alternatives to treatment. A timeout was performed prior to the initiation of the procedure. Initial ultrasound scanning demonstrates a large amount of ascites within the  right lower abdominal quadrant. The right lower abdomen was prepped and draped in the usual sterile fashion. 1% lidocaine was used for local anesthesia. Following this, a 6 Fr Safe-T-Centesis catheter was introduced. An ultrasound image was saved for documentation purposes. The paracentesis was performed. The catheter was removed  and a dressing was applied. The patient tolerated the procedure well without immediate post procedural complication. FINDINGS: A total of approximately 3.2 L of yellow fluid was removed. Samples were sent to the laboratory as requested by the clinical team. IMPRESSION: Successful ultrasound-guided paracentesis yielding 3.2 liters of peritoneal fluid. Electronically Signed   By: Markus Daft M.D.   On: 12/07/2018 14:03   ASSESSMENT AND PLAN:   Cirrhosis of liver with ascites S/p paracentesis just now.  Continue torsemide.  Hypokalemia.  Potassium supplement. Hyponatremia.  Adjust during hemodialysis.  Follow-up BMP.    Diabetes ([place]) -sliding scale insulin.   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   Hyperlipidemia -home dose antilipid Discussed with Dr. Juleen China. All the records are reviewed and case discussed with Care Management/Social Worker. Management plans discussed with the patient, his daughter and they are in agreement.  CODE STATUS: Full Code  TOTAL TIME TAKING CARE OF THIS PATIENT: 27 minutes.   More than 50% of the time was spent in counseling/coordination of care: YES  POSSIBLE D/C IN 2 DAYS, DEPENDING ON CLINICAL CONDITION.   Demetrios Loll M.D on 12/07/2018 at 2:31 PM  Between 7am to 6pm - Pager - [phone]  After 6pm go to [email or web address] - [facility]

## 2018-12-07 NOTE — Progress Notes (Signed)
This note also relates to the following rows which could not be included: Pulse Rate - Cannot attach notes to unvalidated device data Resp - Cannot attach notes to unvalidated device data  Hd started  

## 2018-12-07 NOTE — Progress Notes (Signed)
This note also relates to the following rows which could not be included: Pulse Rate - Cannot attach notes to unvalidated device data Resp - Cannot attach notes to unvalidated device data SpO2 - Cannot attach notes to unvalidated device data  Tolerated well no s/s of distress to note remain alert and oriented   12/07/18 1218  Vital Signs  Temp 98.9 F (37.2 C)  Temp Source Oral  Pulse Rate Source Monitor  BP 131/69  BP Location Right Arm  BP Method Automatic  Patient Position (if appropriate) Lying  Oxygen Therapy  O2 Device Room Air  Pain Assessment  Pain Scale 0-10  Pain Score 0  During Hemodialysis Assessment  Blood Flow Rate (mL/min) 150 mL/min  Arterial Pressure (mmHg) -10 mmHg  Venous Pressure (mmHg) 80 mmHg  Transmembrane Pressure (mmHg) 60 mmHg  Ultrafiltration Rate (mL/min) 0 mL/min  Dialysate Flow Rate (mL/min) 800 ml/min  Conductivity: Machine  13.9  HD Safety Checks Performed Yes  KECN 58.1 KECN  Dialysis Fluid Bolus Normal Saline  Bolus Amount (mL) 250 mL  Intra-Hemodialysis Comments Tx completed  Post-Hemodialysis Assessment  Rinseback Volume (mL) 250 mL  KECN 58.1 V  Dialyzer Clearance Clear  Duration of HD Treatment -hour(s) 3 hour(s)  Hemodialysis Intake (mL) 500 mL  UF Total -Machine (mL) 3000 mL  Net UF (mL) 2500 mL  Tolerated HD Treatment Yes  Post-Hemodialysis Comments tolerated well no s/s of distress to note  AVG/AVF Arterial Site Held (minutes) 5 minutes  AVG/AVF Venous Site Held (minutes) 5 minutes  Education / Care Plan  Dialysis Education Provided Yes  Documented Education in Care Plan Yes  Fistula / Graft Left Upper arm Arteriovenous fistula  Placement Date/Time: 08/24/18 1543   Placed prior to admission: Yes  Orientation: Left  Access Location: Upper arm  Access Type: Arteriovenous fistula  Site Condition No complications  Fistula / Graft Assessment Present;Thrill;Bruit  Status Deaccessed  Drainage Description None     12/07/18  1218  Vital Signs  Temp 98.9 F (37.2 C)  Temp Source Oral  Pulse Rate Source Monitor  BP 131/69  BP Location Right Arm  BP Method Automatic  Patient Position (if appropriate) Lying  Oxygen Therapy  O2 Device Room Air  Pain Assessment  Pain Scale 0-10  Pain Score 0  During Hemodialysis Assessment  Blood Flow Rate (mL/min) 150 mL/min  Arterial Pressure (mmHg) -10 mmHg  Venous Pressure (mmHg) 80 mmHg  Transmembrane Pressure (mmHg) 60 mmHg  Ultrafiltration Rate (mL/min) 0 mL/min  Dialysate Flow Rate (mL/min) 800 ml/min  Conductivity: Machine  13.9  HD Safety Checks Performed Yes  KECN 58.1 KECN  Dialysis Fluid Bolus Normal Saline  Bolus Amount (mL) 250 mL  Intra-Hemodialysis Comments Tx completed  Post-Hemodialysis Assessment  Rinseback Volume (mL) 250 mL  KECN 58.1 V  Dialyzer Clearance Clear  Duration of HD Treatment -hour(s) 3 hour(s)  Hemodialysis Intake (mL) 500 mL  UF Total -Machine (mL) 3000 mL  Net UF (mL) 2500 mL  Tolerated HD Treatment Yes  Post-Hemodialysis Comments tolerated well no s/s of distress to note  AVG/AVF Arterial Site Held (minutes) 5 minutes  AVG/AVF Venous Site Held (minutes) 5 minutes  Education / Care Plan  Dialysis Education Provided Yes  Documented Education in Care Plan Yes  Fistula / Graft Left Upper arm Arteriovenous fistula  Placement Date/Time: 08/24/18 1543   Placed prior to admission: Yes  Orientation: Left  Access Location: Upper arm  Access Type: Arteriovenous fistula  Site Condition No complications  Fistula / Graft Assessment Present;Thrill;Bruit  Status Deaccessed  Drainage Description None

## 2018-12-07 NOTE — Procedures (Signed)
Interventional Radiology Procedure:   Indications: Ascites  Procedure: US guided paracentesis  Findings: Removed 3200 ml from RLQ quadrant  Complications: None     EBL: Less than 10 ml   Thomas Milbourne R. Anselm Pancoast, MD  Pager: [phone]

## 2018-12-08 LAB — BASIC METABOLIC PANEL WITH GFR
Anion gap: 11 (ref 5–15)
BUN: 31 mg/dL — ABNORMAL HIGH (ref 8–23)
CO2: 28 mmol/L (ref 22–32)
Calcium: 8.3 mg/dL — ABNORMAL LOW (ref 8.9–10.3)
Chloride: 99 mmol/L (ref 98–111)
Creatinine, Ser: 2.73 mg/dL — ABNORMAL HIGH (ref 0.61–1.24)
GFR calc Af Amer: 25 mL/min — ABNORMAL LOW
GFR calc non Af Amer: 22 mL/min — ABNORMAL LOW
Glucose, Bld: 132 mg/dL — ABNORMAL HIGH (ref 70–99)
Potassium: 3.6 mmol/L (ref 3.5–5.1)
Sodium: 138 mmol/L (ref 135–145)

## 2018-12-08 LAB — TRIGLYCERIDES, BODY FLUIDS: Triglycerides, Fluid: 51 mg/dL

## 2018-12-08 LAB — GLUCOSE, CAPILLARY: Glucose-Capillary: 136 mg/dL — ABNORMAL HIGH (ref 70–99)

## 2018-12-08 LAB — MAGNESIUM: Magnesium: 1.9 mg/dL (ref 1.7–2.4)

## 2018-12-08 NOTE — Discharge Summary (Signed)
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 Allergy   . Arthritis   . CAD (coronary artery disease)   . Chronic kidney disease   . Diabetes mellitus without complication ([place])   . Dyspnea   . Erosive esophagitis   . ESRD (end stage renal disease) on dialysis ([place]) 2019   MWF, Left arm fistula  . GERD (gastroesophageal reflux disease)   . Gouty arthropathy   . History of colonic polyps   . History of kidney stones   . History of MRSA infection   . Hyperkalemia   . Hyperlipidemia   . Hypertension   . Melanoma ([place])   . Myocardial infarction ([place]) 1986, 2019  . Peripheral vascular disease ([place])   . 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 Trigger finger of left hand   . Ureteral tumor    HOSPITAL COURSE:   Cirrhosis of liver with ascites S/p paracentesis withdrawal of 3.2 L. No sign of infection. Continue torsemide.  Hypokalemia.  improved with Potassium supplement. Hyponatremia.  improved.  Diabetes ([place]) -sliding scale insulin. 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 Hyperlipidemia -home dose antilipid  DISCHARGE CONDITIONS:  Stable, discharge to home today. CONSULTS OBTAINED:   DRUG ALLERGIES:   Allergies  Allergen Reactions  . Ciprofloxacin Itching and Swelling    Facial swelling   . Hydralazine Nausea And Vomiting  . Trulicity [Dulaglutide] Other (See Comments)    Bloating   DISCHARGE MEDICATIONS:   Allergies as of 12/08/2018      Reactions   Ciprofloxacin Itching, Swelling   Facial swelling    Hydralazine Nausea And Vomiting   Trulicity [dulaglutide] Other (See Comments)   Bloating      Medication List    STOP taking these medications   promethazine 25 MG tablet Commonly known as: PHENERGAN     TAKE these medications   acetaminophen 500 MG tablet Commonly known as: TYLENOL Take 500 mg by mouth every 8 (eight) hours as needed for mild pain or headache.   ALPRAZolam 0.25 MG tablet Commonly known as: XANAX Take 1-2 tablets (0.25-0.5 mg total) by mouth daily as needed for anxiety.   atorvastatin 80 MG tablet Commonly known as: LIPITOR TAKE 1 TABLET BY MOUTH ONCE DAILY   calcium acetate 667 MG capsule Commonly known as: PHOSLO TAKE 1 CAPSULE BY MOUTH 3 TIMES DAILY WITH MEALS   clopidogrel 75 MG tablet Commonly known as: PLAVIX TAKE 1 TABLET BY MOUTH ONCE DAILY   glucose blood test strip Contour [employer] test strips and  lancets Use to check blood sugar daily for type 2 diabetes, E11.9.   insulin degludec 100 UNIT/ML Sopn FlexTouch Pen Commonly known as: Tyler Aas FlexTouch Inject 0.06 mLs (6 Units total) into the skin daily. Increase by 2 units a day if fasting glucose is over 200   isosorbide mononitrate 60 MG 24 hr tablet Commonly known as: IMDUR TAKE 1 TABLET BY MOUTH ONCE DAILY   LORazepam 0.5 MG tablet Commonly known as: ATIVAN Take 1 tablet (0.5  mg total) by mouth at bedtime as needed for sleep.   losartan 25 MG tablet Commonly known as: COZAAR TAKE 1 TABLET BY MOUTH ONCE EVERY EVENING   magnesium oxide 400 MG tablet Commonly known as: MAG-OX Take 400 mg by mouth daily.   metoprolol succinate 50 MG 24 hr tablet Commonly known as: TOPROL-XL TAKE 1 TABLET BY MOUTH ONCE DAILY   ONE TOUCH ULTRA 2 w/Device Kit Use to check gluocose daily for type 2 diabetes   pantoprazole 40 MG tablet Commonly known as: PROTONIX Take 1 tablet (40 mg total) by mouth daily.   polycarbophil 625 MG tablet Commonly known as: FIBERCON Take 625 mg by mouth as needed.   RENA-VITE PO Take by mouth daily.   sodium bicarbonate 650 MG tablet Take 650 mg by mouth 2 (two) times daily.   tamsulosin 0.4 MG Caps capsule Commonly known as: FLOMAX TAKE 1 CAPSULE BY MOUTH ONCE DAILY 30 MINUTES AFTER LARGEST MEAL.   torsemide 100 MG tablet Commonly known as: DEMADEX Take 100 mg by mouth daily. Do not take on dialysis days   traMADol 50 MG tablet Commonly known as: Ultram Take 1 tablet (50 mg total) by mouth every 6 (six) hours as needed (Pain).   traZODone 50 MG tablet Commonly known as: DESYREL TAKE 1/2 TO 1 TABLET BY MOUTH AT BEDTIMEAS NEEDED SLEEP        DISCHARGE INSTRUCTIONS:  See AVS.  If you experience worsening of your admission symptoms, develop shortness of breath, life threatening emergency, suicidal or homicidal thoughts you must seek medical attention immediately by calling 911 or calling your MD immediately  if symptoms less severe.  You Must read complete instructions/literature along with all the possible adverse reactions/side effects for all the Medicines you take and that have been prescribed to you. Take any new Medicines after you have completely understood and accpet all the possible adverse reactions/side effects.   Please note  You were cared for by a hospitalist during your hospital stay. If you have any questions  about your discharge medications or the care you received while you were in the hospital after you are discharged, you can call the unit and asked to speak with the hospitalist on call if the hospitalist that took care of you is not available. Once you are discharged, your primary care physician will handle any further medical issues. Please note that NO REFILLS for any discharge medications will be authorized once you are discharged, as it is imperative that you return to your primary care physician (or establish a relationship with a primary care physician if you do not have one) for your aftercare needs so that they can reassess your need for medications and monitor your lab values.    On the day of Discharge:  VITAL SIGNS:  Blood pressure 120/76, pulse 73, temperature 98.2 F (36.8 C), resp. rate 17, height 5' 6"  (1.676 m), weight 75.8 kg, SpO2 95 %. PHYSICAL EXAMINATION:  GENERAL:  73 y.o.-year-old patient lying in the bed  with no acute distress.  EYES: Pupils equal, round, reactive to light and accommodation. No scleral icterus. Extraocular muscles intact.  HEENT: Head atraumatic, normocephalic. Oropharynx and nasopharynx clear.  NECK:  Supple, no jugular venous distention. No thyroid enlargement, no tenderness.  LUNGS: Normal breath sounds bilaterally, no wheezing, rales,rhonchi or crepitation. No use of accessory muscles of respiration.  CARDIOVASCULAR: S1, S2 normal. No murmurs, rubs, or gallops.  ABDOMEN: Soft, non-tender, non-distended. Bowel sounds present. No organomegaly or mass.  EXTREMITIES: bilateral leg and pedal edema, no cyanosis, or clubbing.  NEUROLOGIC: Cranial nerves II through XII are intact. Muscle strength 5/5 in all extremities. Sensation intact. Gait not checked.  PSYCHIATRIC: The patient is alert and oriented x 3.  SKIN: No obvious rash, lesion, or ulcer.  DATA REVIEW:   CBC Recent Labs  Lab 12/07/18 0637  WBC 7.4  HGB 11.3*  HCT 34.3*  PLT 214     Chemistries  Recent Labs  Lab 12/07/18 0637 12/08/18 0426  NA 134* 138  K 3.4* 3.6  CL 101 99  CO2 26 28  GLUCOSE 139* 132*  BUN 45* 31*  CREATININE 3.21* 2.73*  CALCIUM 8.6* 8.3*  MG  --  1.9  AST 21  --   ALT 16  --   ALKPHOS 240*  --   BILITOT 0.9  --      Microbiology Results  Results for orders placed or performed during the hospital encounter of 12/06/18  SARS Coronavirus 2 [facility] order, Performed in [facility] hospital lab) Nasopharyngeal Nasopharyngeal Swab     Status: None   Collection Time: 12/06/18  8:55 PM   Specimen: Nasopharyngeal Swab  Result Value Ref Range Status   SARS Coronavirus 2 NEGATIVE NEGATIVE Final    Comment: (NOTE) If result is NEGATIVE SARS-CoV-2 target nucleic acids are NOT DETECTED. The SARS-CoV-2 RNA is generally detectable in upper and lower  respiratory specimens during the acute phase of infection. The lowest  concentration of SARS-CoV-2 viral copies this assay can detect is 250  copies / mL. A negative result does not preclude SARS-CoV-2 infection  and should not be used as the sole basis for treatment or other  patient management decisions.  A negative result may occur with  improper specimen collection / handling, submission of specimen other  than nasopharyngeal swab, presence of viral mutation(s) within the  areas targeted by this assay, and inadequate number of viral copies  (<250 copies / mL). A negative result must be combined with clinical  observations, patient history, and epidemiological information. If result is POSITIVE SARS-CoV-2 target nucleic acids are DETECTED. The SARS-CoV-2 RNA is generally detectable in upper and lower  respiratory specimens dur ing the acute phase of infection.  Positive  results are indicative of active infection with SARS-CoV-2.  Clinical  correlation with patient history and other diagnostic information is  necessary to determine patient infection status.  Positive results do  not rule  out bacterial infection or co-infection with other viruses. If result is PRESUMPTIVE POSTIVE SARS-CoV-2 nucleic acids MAY BE PRESENT.   A presumptive positive result was obtained on the submitted specimen  and confirmed on repeat testing.  While 2019 novel coronavirus  (SARS-CoV-2) nucleic acids may be present in the submitted sample  additional confirmatory testing may be necessary for epidemiological  and / or clinical management purposes  to differentiate between  SARS-CoV-2 and other Sarbecovirus currently known to infect humans.  If clinically indicated additional testing with an alternate test  methodology [phone])  is advised. The SARS-CoV-2 RNA is generally  detectable in upper and lower respiratory sp ecimens during the acute  phase of infection. The expected result is Negative. Fact Sheet for Patients:  StrictlyIdeas.no Fact Sheet for Healthcare Providers: BankingDealers.co.za This test is not yet approved or cleared by the [place] FDA and has been authorized for detection and/or diagnosis of SARS-CoV-2 by FDA under an Emergency Use Authorization (EUA).  This EUA will remain in effect (meaning this test can be used) for the duration of the COVID-19 declaration under Section 564(b)(1) of the Act, 21 U.S.C. section 360bbb-3(b)(1), unless the authorization is terminated or revoked sooner. Performed at [facility], [address] [place], [place] [address]   Body fluid culture     Status: None (Preliminary result)   Collection Time: 12/07/18  1:20 PM   Specimen: [facility] Cytology Peritoneal fluid  Result Value Ref Range Status   Specimen Description   Final    PERITONEAL Performed at [facility], [address] [place], [place] [address]    Special Requests   Final    NONE Performed at [facility], [address] [place], [place] [address]    Gram Stain   Final    RARE WBC PRESENT,BOTH  PMN AND MONONUCLEAR NO ORGANISMS SEEN    Culture   Final    NO GROWTH < 12 HOURS Performed at [facility], [place] [address] [place], [place] [address]    Report Status PENDING  Incomplete    RADIOLOGY:  US Paracentesis  Result Date: 12/07/2018 INDICATION: 73 year old with ascites. EXAM: ULTRASOUND GUIDED PARACENTESIS MEDICATIONS: None. COMPLICATIONS: None immediate. PROCEDURE: Informed written consent was obtained from the patient after a discussion of the risks, benefits and alternatives to treatment. A timeout was performed prior to the initiation of the procedure. Initial ultrasound scanning demonstrates a large amount of ascites within the right lower abdominal quadrant. The right lower abdomen was prepped and draped in the usual sterile fashion. 1% lidocaine was used for local anesthesia. Following this, a 6 Fr Safe-T-Centesis catheter was introduced. An ultrasound image was saved for documentation purposes. The paracentesis was performed. The catheter was removed and a dressing was applied. The patient tolerated the procedure well without immediate post procedural complication. FINDINGS: A total of approximately 3.2 L of yellow fluid was removed. Samples were sent to the laboratory as requested by the clinical team. IMPRESSION: Successful ultrasound-guided paracentesis yielding 3.2 liters of peritoneal fluid. Electronically Signed   By: Markus Daft M.D.   On: 12/07/2018 14:03     Management plans discussed with the patient, family and they are in agreement.  CODE STATUS: Full Code   TOTAL TIME TAKING CARE OF THIS PATIENT: 32 minutes.    Thomas [name] M.D on 12/08/2018 at 9:49 AM  Between 7am to 6pm - Pager - [phone]  After 6pm go to [email or web address] - Proofreader  [facility]  Office  [phone]  CC: Primary care physician; Birdie Sons, MD   Note: This dictation was prepared with Dragon dictation along with smaller phrase  technology. Any transcriptional errors that result from this process are unintentional.

## 2018-12-08 NOTE — Progress Notes (Signed)
[name] to be D/C'd home per MD order.  Discussed prescriptions and follow up appointments with the patient. Prescriptions given to patient, medication list explained in detail. Pt verbalized understanding.  Allergies as of 12/08/2018      Reactions   Ciprofloxacin Itching, Swelling   Facial swelling    Hydralazine Nausea And Vomiting   Trulicity [dulaglutide] Other (See Comments)   Bloating      Medication List    STOP taking these medications   promethazine 25 MG tablet Commonly known as: PHENERGAN     TAKE these medications   acetaminophen 500 MG tablet Commonly known as: TYLENOL Take 500 mg by mouth every 8 (eight) hours as needed for mild pain or headache.   ALPRAZolam 0.25 MG tablet Commonly known as: XANAX Take 1-2 tablets (0.25-0.5 mg total) by mouth daily as needed for anxiety.   atorvastatin 80 MG tablet Commonly known as: LIPITOR TAKE 1 TABLET BY MOUTH ONCE DAILY   calcium acetate 667 MG capsule Commonly known as: PHOSLO TAKE 1 CAPSULE BY MOUTH 3 TIMES DAILY WITH MEALS   clopidogrel 75 MG tablet Commonly known as: PLAVIX TAKE 1 TABLET BY MOUTH ONCE DAILY   glucose blood test strip Contour [employer] test strips and lancets Use to check blood sugar daily for type 2 diabetes, E11.9.   insulin degludec 100 UNIT/ML Sopn FlexTouch Pen Commonly known as: Tyler Aas FlexTouch Inject 0.06 mLs (6 Units total) into the skin daily. Increase by 2 units a day if fasting glucose is over 200   isosorbide mononitrate 60 MG 24 hr tablet Commonly known as: IMDUR TAKE 1 TABLET BY MOUTH ONCE DAILY   LORazepam 0.5 MG tablet Commonly known as: ATIVAN Take 1 tablet (0.5 mg total) by mouth at bedtime as needed for sleep.   losartan 25 MG tablet Commonly known as: COZAAR TAKE 1 TABLET BY MOUTH ONCE EVERY EVENING   magnesium oxide 400 MG tablet Commonly known as: MAG-OX Take 400 mg by mouth daily.   metoprolol succinate 50 MG 24 hr tablet Commonly known as:  TOPROL-XL TAKE 1 TABLET BY MOUTH ONCE DAILY   ONE TOUCH ULTRA 2 w/Device Kit Use to check gluocose daily for type 2 diabetes   pantoprazole 40 MG tablet Commonly known as: PROTONIX Take 1 tablet (40 mg total) by mouth daily.   polycarbophil 625 MG tablet Commonly known as: FIBERCON Take 625 mg by mouth as needed.   RENA-VITE PO Take by mouth daily.   sodium bicarbonate 650 MG tablet Take 650 mg by mouth 2 (two) times daily.   tamsulosin 0.4 MG Caps capsule Commonly known as: FLOMAX TAKE 1 CAPSULE BY MOUTH ONCE DAILY 30 MINUTES AFTER LARGEST MEAL.   torsemide 100 MG tablet Commonly known as: DEMADEX Take 100 mg by mouth daily. Do not take on dialysis days   traMADol 50 MG tablet Commonly known as: Ultram Take 1 tablet (50 mg total) by mouth every 6 (six) hours as needed (Pain).   traZODone 50 MG tablet Commonly known as: DESYREL TAKE 1/2 TO 1 TABLET BY MOUTH AT BEDTIMEAS NEEDED SLEEP       Vitals:   12/07/18 2029 12/08/18 0501  BP: 122/73 120/76  Pulse: 79 73  Resp: 18 17  Temp: 97.9 F (36.6 C) 98.2 F (36.8 C)  SpO2: 95% 95%    Skin clean, dry and intact without evidence of skin break down, no evidence of skin tears noted. IV catheter discontinued intact. Site without signs and symptoms of  complications. Dressing and pressure applied. Pt denies pain at this time. No complaints noted.  An After Visit Summary was printed and given to the patient. Patient escorted via [place], and D/C home via private auto.  Fuller Mandril, RN

## 2018-12-08 NOTE — Progress Notes (Signed)
[facility]  ROUNDING NOTE   Subjective:   Hemodialysis treatment yesterday. Tolerated treatment well. UF of 2.5 liters.   Large volume paracentesis - 3.2 liters removed.   Objective:  Vital signs in last 24 hours:  Temp:  [97.9 F (36.6 C)-98.9 F (37.2 C)] 98.2 F (36.8 C) (08/22 0501) Pulse Rate:  [69-84] 73 (08/22 0501) Resp:  [12-19] 17 (08/22 0501) BP: (119-148)/(61-111) 120/76 (08/22 0501) SpO2:  [95 %-99 %] 95 % (08/22 0501)  Weight change:  Filed Weights   12/06/18 1700 12/06/18 2350 12/07/18 0500  Weight: 73.5 kg 75.8 kg 75.8 kg    Intake/Output: I/O last 3 completed shifts: In: -  Out: 2700 [Urine:200; Other:2500]   Intake/Output this shift:  Total I/O In: 240 [P.O.:240] Out: -   Physical Exam: General: NAD, laying in bed  Head: Normocephalic, atraumatic. Moist oral mucosal membranes  Eyes: Anicteric, PERRL  Neck: Supple, trachea midline  Lungs:  Clear to auscultation  Heart: Regular rate and rhythm  Abdomen:   +ventral hernia  Extremities: + peripheral edema.  Neurologic: Nonfocal, moving all four extremities  Skin: No lesions  Access: Left AVF    Basic Metabolic Panel: Recent Labs  Lab 12/06/18 1703 12/06/18 1917 12/07/18 0637 12/08/18 0426  NA 134*  --  134* 138  K 3.4*  --  3.4* 3.6  CL 97*  --  101 99  CO2 26  --  26 28  GLUCOSE 237*  --  139* 132*  BUN 40*  --  45* 31*  CREATININE 3.12*  --  3.21* 2.73*  CALCIUM 8.7*  --  8.6* 8.3*  MG  --  1.8  --  1.9    Liver Function Tests: Recent Labs  Lab 12/07/18 0637  AST 21  ALT 16  ALKPHOS 240*  BILITOT 0.9  PROT 6.4*  ALBUMIN 2.6*   No results for input(s): LIPASE, AMYLASE in the last 168 hours. No results for input(s): AMMONIA in the last 168 hours.  CBC: Recent Labs  Lab 12/06/18 1703 12/07/18 0637  WBC 8.1 7.4  HGB 11.5* 11.3*  HCT 36.3* 34.3*  MCV 97.1 93.5  PLT 223 214    Cardiac Enzymes: No results for input(s): CKTOTAL, CKMB, CKMBINDEX,  TROPONINI in the last 168 hours.  BNP: Invalid input(s): POCBNP  CBG: Recent Labs  Lab 12/07/18 0558 12/07/18 1426 12/07/18 1850 12/07/18 2347 12/08/18 0745  GLUCAP 144* 94 218* 154* 136*    Microbiology: Results for orders placed or performed during the hospital encounter of 12/06/18  SARS Coronavirus 2 [facility] order, Performed in [facility] hospital lab) Nasopharyngeal Nasopharyngeal Swab     Status: None   Collection Time: 12/06/18  8:55 PM   Specimen: Nasopharyngeal Swab  Result Value Ref Range Status   SARS Coronavirus 2 NEGATIVE NEGATIVE Final    Comment: (NOTE) If result is NEGATIVE SARS-CoV-2 target nucleic acids are NOT DETECTED. The SARS-CoV-2 RNA is generally detectable in upper and lower  respiratory specimens during the acute phase of infection. The lowest  concentration of SARS-CoV-2 viral copies this assay can detect is 250  copies / mL. A negative result does not preclude SARS-CoV-2 infection  and should not be used as the sole basis for treatment or other  patient management decisions.  A negative result may occur with  improper specimen collection / handling, submission of specimen other  than nasopharyngeal swab, presence of viral mutation(s) within the  areas targeted by this assay, and inadequate number of  viral copies  (<250 copies / mL). A negative result must be combined with clinical  observations, patient history, and epidemiological information. If result is POSITIVE SARS-CoV-2 target nucleic acids are DETECTED. The SARS-CoV-2 RNA is generally detectable in upper and lower  respiratory specimens dur ing the acute phase of infection.  Positive  results are indicative of active infection with SARS-CoV-2.  Clinical  correlation with patient history and other diagnostic information is  necessary to determine patient infection status.  Positive results do  not rule out bacterial infection or co-infection with other viruses. If result is  PRESUMPTIVE POSTIVE SARS-CoV-2 nucleic acids MAY BE PRESENT.   A presumptive positive result was obtained on the submitted specimen  and confirmed on repeat testing.  While 2019 novel coronavirus  (SARS-CoV-2) nucleic acids may be present in the submitted sample  additional confirmatory testing may be necessary for epidemiological  and / or clinical management purposes  to differentiate between  SARS-CoV-2 and other Sarbecovirus currently known to infect humans.  If clinically indicated additional testing with an alternate test  methodology [id number]) is advised. The SARS-CoV-2 RNA is generally  detectable in upper and lower respiratory sp ecimens during the acute  phase of infection. The expected result is Negative. Fact Sheet for Patients:  StrictlyIdeas.no Fact Sheet for Healthcare Providers: BankingDealers.co.za This test is not yet approved or cleared by the [place] FDA and has been authorized for detection and/or diagnosis of SARS-CoV-2 by FDA under an Emergency Use Authorization (EUA).  This EUA will remain in effect (meaning this test can be used) for the duration of the COVID-19 declaration under Section 564(b)(1) of the Act, 21 U.S.C. section 360bbb-3(b)(1), unless the authorization is terminated or revoked sooner. Performed at [facility], [address] [place], [place] [address]   Body fluid culture     Status: None (Preliminary result)   Collection Time: 12/07/18  1:20 PM   Specimen: [facility] Cytology Peritoneal fluid  Result Value Ref Range Status   Specimen Description   Final    PERITONEAL Performed at [facility], [address], [place], [place] [address]    Special Requests   Final    NONE Performed at [facility], [address] [place], [place] [address]    Gram Stain   Final    RARE WBC PRESENT,BOTH PMN AND MONONUCLEAR NO ORGANISMS SEEN    Culture   Final    NO GROWTH <  12 HOURS Performed at [facility], [place] [address], [place], [place] [address]    Report Status PENDING  Incomplete    Coagulation Studies: Recent Labs    12/06/18 1917  LABPROT 15.7*  INR 1.3*    Urinalysis: Recent Labs    12/06/18 2025  COLORURINE YELLOW*  LABSPEC 1.015  PHURINE 5.0  GLUCOSEU 50*  HGBUR SMALL*  BILIRUBINUR NEGATIVE  KETONESUR NEGATIVE  PROTEINUR 100*  NITRITE NEGATIVE  LEUKOCYTESUR NEGATIVE      Imaging: Ct Abdomen Pelvis Wo Contrast  Result Date: 12/06/2018 CLINICAL DATA:  Abdominal distension and pain, ongoing chest tightness EXAM: CT ABDOMEN AND PELVIS WITHOUT CONTRAST TECHNIQUE: Multidetector CT imaging of the abdomen and pelvis was performed following the standard protocol without IV contrast. COMPARISON:  CT 03/09/2018 FINDINGS: Lower chest: Small right pleural effusion with adjacent areas of passive atelectasis. Cardiomegaly. Coronary calcifications. Hepatobiliary: No focal hepatic lesions. Some mild lobulation to the liver surface contour. No calcified gallstones. No biliary ductal dilatation. Mild gallbladder wall thickening, nonspecific in  the setting of ascites. Pancreas: Unremarkable. No pancreatic ductal dilatation or surrounding inflammatory changes. Spleen: Normal in size without focal abnormality. Adrenals/Urinary Tract: Normal adrenal glands. Mild bilateral nonspecific perinephric stranding, a nonspecific finding though may correlate with either age or decreased renal function. Few punctate nonobstructing calculi in both kidneys. No concerning renal lesions. No hydronephrosis or obstructive urolithiasis. Urinary bladder is circumferentially thickened, greater than expected for the degree of underdistention. Stomach/Bowel: Distal esophagus, stomach and duodenal sweep are unremarkable. No bowel wall thickening or dilatation. No evidence of obstruction. Scattered colonic diverticula without focal pericolonic inflammation to suggest  diverticulitis. Vascular/Lymphatic: Atherosclerotic plaque within the normal caliber aorta. Right proximal superficial femoral artery stent graft is noted. No suspicious or enlarged lymph nodes in the included lymphatic chains. Reproductive: Coarse eccentric calcification of the prostate. No concerning abnormalities of the prostate or seminal vesicles. Other: Moderate volume ascites, increased from comparison studies. Multiple fat containing ventral hernias in the periumbilical and supraumbilical regions, with stable fascial defects but now containing some ascitic fluid. Circumferential body wall edema is noted. Additional soft tissue edema tracks inferiorly to the external genitalia Musculoskeletal: No acute osseous abnormality or suspicious osseous lesion. IMPRESSION: 1. Moderate volume ascites, increased from comparison studies. Fluid sterility not ascertained on imaging. 2. Questionable nodularity of hepatic surface, correlate for intrinsic liver disease. 3. Circumferentially thickened urinary bladder wall, greater than expected for the degree of underdistention. Correlate with urinalysis to exclude cystitis. 4. Multiple fat containing ventral hernias in the periumbilical and supraumbilical regions, with stable fascial defects but now containing some ascitic fluid. 5. Small right pleural effusion with adjacent areas of passive atelectasis. 6. Aortic Atherosclerosis (ICD10-I70.0). Electronically Signed   By: Lovena Le M.D.   On: 12/06/2018 19:45   Dg Chest 2 View  Result Date: 12/06/2018 CLINICAL DATA:  Chest tightness for several weeks. EXAM: CHEST - 2 VIEW COMPARISON:  March 17, 2018 FINDINGS: Mediastinal contour is normal. Heart size is enlarged. Patient status post prior CABG and median sternotomy. Increased pulmonary interstitium is identified bilaterally. There is no focal pneumonia. Minimal right pleural effusion is identified. Bony structures are stable. IMPRESSION: Mild interstitial edema.   Minimal right pleural effusion. Electronically Signed   By: Abelardo Diesel M.D.   On: 12/06/2018 17:24   US Paracentesis  Result Date: 12/07/2018 INDICATION: 73 year old with ascites. EXAM: ULTRASOUND GUIDED PARACENTESIS MEDICATIONS: None. COMPLICATIONS: None immediate. PROCEDURE: Informed written consent was obtained from the patient after a discussion of the risks, benefits and alternatives to treatment. A timeout was performed prior to the initiation of the procedure. Initial ultrasound scanning demonstrates a large amount of ascites within the right lower abdominal quadrant. The right lower abdomen was prepped and draped in the usual sterile fashion. 1% lidocaine was used for local anesthesia. Following this, a 6 Fr Safe-T-Centesis catheter was introduced. An ultrasound image was saved for documentation purposes. The paracentesis was performed. The catheter was removed and a dressing was applied. The patient tolerated the procedure well without immediate post procedural complication. FINDINGS: A total of approximately 3.2 L of yellow fluid was removed. Samples were sent to the laboratory as requested by the clinical team. IMPRESSION: Successful ultrasound-guided paracentesis yielding 3.2 liters of peritoneal fluid. Electronically Signed   By: Markus Daft M.D.   On: 12/07/2018 14:03     Medications:    . atorvastatin  80 mg Oral Daily  . calcium acetate  667 mg Oral TID WC  . clopidogrel  75 mg Oral Daily  . heparin  5,000 Units Subcutaneous Q8H  . insulin aspart  0-9 Units Subcutaneous TID AC & HS  . isosorbide mononitrate  60 mg Oral Daily  . losartan  25 mg Oral Daily  . pantoprazole  40 mg Oral Daily  . sodium bicarbonate  650 mg Oral BID  . tamsulosin  0.4 mg Oral QPC supper  . torsemide  100 mg Oral Once per day on Sun Tue Thu Sat   acetaminophen **OR** acetaminophen, ALPRAZolam, ondansetron **OR** ondansetron (ZOFRAN) IV, oxyCODONE, traMADol, traZODone  Assessment/ Plan:  [name] is a 73 y.o. white male with end stage renal disease, diabetes mellitus, hypertension, coronary artery disease status CABG, peripheral arterial disease, congestive heart failure. Admitted to [facility] on 12/06/2018 for Other ascites [R18.8] Hepatic cirrhosis, unspecified hepatic cirrhosis type, unspecified whether ascites present ([place]) [K74.60]  Scheduled for large volume paracentesis today.   [facility] M[facility] 46 kg Left AVF  1. End stage renal disease. Scheduled as an outpatient for extra dialysis treatment today. However patient declines this. Patient to be discharged and next hemodialysis treatment is for Monday.   2. Hypertension: at goal but with significant peripheral edema. Home regimen of losartan and tamsulosin  3. Diabetes mellitus type II with chronic kidney disease: history of poor control.   4. Anemia of chronic kidney disease: hemoglobin 11.3 - EPO at outpatient.    LOS: 2 Tyjay Galindo 8/22/202010:28 AM

## 2018-12-10 ENCOUNTER — Ambulatory Visit: Payer: Medicare Other

## 2018-12-10 ENCOUNTER — Telehealth: Payer: Self-pay

## 2018-12-10 ENCOUNTER — Other Ambulatory Visit: Payer: Self-pay | Admitting: Family Medicine

## 2018-12-10 DIAGNOSIS — I5023 Acute on chronic systolic (congestive) heart failure: Secondary | ICD-10-CM

## 2018-12-10 LAB — PH, BODY FLUID: pH, Body Fluid: 7.4

## 2018-12-10 NOTE — Telephone Encounter (Signed)
Transition Care Management Follow-up Telephone Call  Date of discharge and from where: [facility] on 12/08/18  How have you been since you were released from the hospital? Doing much better, states he had the best day yesterday that he has had in over a year. Declined pain, fever, SOB, swelling or n/v/d.  Any questions or concerns? No   Items Reviewed:  Did the pt receive and understand the discharge instructions provided? Yes   Medications obtained and verified? Declined today, pt states his daughter manages meds and he will review at his HFU apt.   Any new allergies since your discharge? No   Dietary orders reviewed? Yes  Do you have support at home? Yes   Other (ie: DME, Home Health, etc) N/A  Functional Questionnaire: (I = Independent and D = Dependent)  Bathing/Dressing- I   Meal Prep- I  Eating- I  Maintaining continence- I  Transferring/Ambulation- I  Managing Meds- D, daughter manages medications.    Follow up appointments reviewed:    PCP Hospital f/u appt confirmed? Yes  Scheduled to see Dr Caryn Section on 12/18/18 @ 8:00 AM.  [facility] f/u appt confirmed? Yes    Are transportation arrangements needed? No   If their condition worsens, is the pt aware to call  their PCP or go to the ED? Yes  Was the patient provided with contact information for the PCP's office or ED? Yes  Was the pt encouraged to call back with questions or concerns? Yes

## 2018-12-10 NOTE — Telephone Encounter (Signed)
No HFU scheduled.  

## 2018-12-11 ENCOUNTER — Other Ambulatory Visit
Admission: RE | Admit: 2018-12-11 | Discharge: 2018-12-11 | Disposition: A | Discharge status: Home / Self Care | Payer: Medicare Other | Source: Ambulatory Visit | Attending: Gastroenterology | Admitting: Gastroenterology

## 2018-12-11 DIAGNOSIS — Z992 Dependence on renal dialysis: Secondary | ICD-10-CM | POA: Diagnosis not present

## 2018-12-11 DIAGNOSIS — Z9889 Other specified postprocedural states: Secondary | ICD-10-CM | POA: Insufficient documentation

## 2018-12-11 DIAGNOSIS — N186 End stage renal disease: Secondary | ICD-10-CM | POA: Diagnosis not present

## 2018-12-11 DIAGNOSIS — I509 Heart failure, unspecified: Secondary | ICD-10-CM | POA: Diagnosis not present

## 2018-12-11 LAB — LIPASE, FLUID: Lipase-Fluid: 14 U/L

## 2018-12-11 LAB — BODY FLUID CULTURE: Culture: NO GROWTH

## 2018-12-11 LAB — CYTOLOGY - NON PAP

## 2018-12-12 DIAGNOSIS — N186 End stage renal disease: Secondary | ICD-10-CM | POA: Diagnosis not present

## 2018-12-12 DIAGNOSIS — Z992 Dependence on renal dialysis: Secondary | ICD-10-CM | POA: Diagnosis not present

## 2018-12-12 DIAGNOSIS — I509 Heart failure, unspecified: Secondary | ICD-10-CM | POA: Diagnosis not present

## 2018-12-13 ENCOUNTER — Ambulatory Visit: Payer: Medicare Other

## 2018-12-14 DIAGNOSIS — N186 End stage renal disease: Secondary | ICD-10-CM | POA: Diagnosis not present

## 2018-12-14 DIAGNOSIS — I509 Heart failure, unspecified: Secondary | ICD-10-CM | POA: Diagnosis not present

## 2018-12-14 DIAGNOSIS — Z992 Dependence on renal dialysis: Secondary | ICD-10-CM | POA: Diagnosis not present

## 2018-12-15 DIAGNOSIS — N186 End stage renal disease: Secondary | ICD-10-CM | POA: Diagnosis not present

## 2018-12-15 DIAGNOSIS — Z992 Dependence on renal dialysis: Secondary | ICD-10-CM | POA: Diagnosis not present

## 2018-12-15 DIAGNOSIS — I509 Heart failure, unspecified: Secondary | ICD-10-CM | POA: Diagnosis not present

## 2018-12-17 DIAGNOSIS — Z992 Dependence on renal dialysis: Secondary | ICD-10-CM | POA: Diagnosis not present

## 2018-12-17 DIAGNOSIS — N186 End stage renal disease: Secondary | ICD-10-CM | POA: Diagnosis not present

## 2018-12-17 DIAGNOSIS — I509 Heart failure, unspecified: Secondary | ICD-10-CM | POA: Diagnosis not present

## 2018-12-18 ENCOUNTER — Other Ambulatory Visit: Payer: Self-pay | Admitting: Family Medicine

## 2018-12-18 ENCOUNTER — Other Ambulatory Visit: Payer: Self-pay

## 2018-12-18 ENCOUNTER — Ambulatory Visit (INDEPENDENT_AMBULATORY_CARE_PROVIDER_SITE_OTHER): Payer: Medicare Other | Admitting: Family Medicine

## 2018-12-18 ENCOUNTER — Encounter: Payer: Self-pay | Admitting: Family Medicine

## 2018-12-18 VITALS — BP 124/62 | HR 65 | Temp 97.5°F | Resp 16 | Wt 162.0 lb

## 2018-12-18 DIAGNOSIS — K7460 Unspecified cirrhosis of liver: Secondary | ICD-10-CM

## 2018-12-18 DIAGNOSIS — Z23 Encounter for immunization: Secondary | ICD-10-CM | POA: Diagnosis not present

## 2018-12-18 DIAGNOSIS — R1310 Dysphagia, unspecified: Secondary | ICD-10-CM | POA: Diagnosis not present

## 2018-12-18 DIAGNOSIS — R188 Other ascites: Secondary | ICD-10-CM

## 2018-12-18 DIAGNOSIS — J439 Emphysema, unspecified: Secondary | ICD-10-CM | POA: Diagnosis not present

## 2018-12-18 NOTE — Progress Notes (Signed)
Patient: Thomas [name] Male    DOB: 02-18-1946   73 y.o.   MRN: [id number] Visit Date: 12/18/2018  Today's Provider: Lelon Huh, MD   Chief Complaint  Patient presents with  . Hospitalization Follow-up   Subjective:     HPI  Follow up Hospitalization  Patient was admitted to [facility] on 12/06/2018 and discharged on 12/08/2018. He presented with shortness of breath and abdominal swelling and was treated for cirrhosis of the liver with ascites which had recently been diagnosed and started work up by GI.  Treatment for this included paracentesis withdrawal of 3.2L. Patient was advised to continue Torsemide. He was also started on Potassium supplement for Hypokalemia while in the hospital with potassium of 3.4 on admission and 3.6 before discharge. He has follow up with Dr. Donnella Sham later today. Telephone follow up was done on 12/10/2018 He reports good compliance with treatment. He reports this condition is much better. Marland Kitchen  He states his breathing has greatly improved.  ------------------------------------------------------------------------------------    Allergies  Allergen Reactions  . Ciprofloxacin Itching and Swelling    Facial swelling   . Hydralazine Nausea And Vomiting  . Trulicity [Dulaglutide] Other (See Comments)    Bloating     Current Outpatient Medications:  .  acetaminophen (TYLENOL) 500 MG tablet, Take 500 mg by mouth every 8 (eight) hours as needed for mild pain or headache., Disp: , Rfl:  .  ALPRAZolam (XANAX) 0.25 MG tablet, Take 1-2 tablets (0.25-0.5 mg total) by mouth daily as needed for anxiety., Disp: 60 tablet, Rfl: 3 .  atorvastatin (LIPITOR) 80 MG tablet, TAKE 1 TABLET BY MOUTH ONCE DAILY, Disp: 30 tablet, Rfl: 5 .  B Complex-C-Folic Acid (RENA-VITE PO), Take by mouth daily., Disp: , Rfl:  .  Blood Glucose Monitoring Suppl (ONE TOUCH ULTRA 2) w/Device KIT, Use to check gluocose daily for type 2 diabetes, Disp: 1 each, Rfl: 0 .  calcium acetate  (PHOSLO) 667 MG capsule, TAKE ONE CAPSULE BY MOUTH THREE TIMES DAILY WITH MEALS, Disp: 276 capsule, Rfl: 4 .  clopidogrel (PLAVIX) 75 MG tablet, TAKE 1 TABLET BY MOUTH ONCE DAILY, Disp: 30 tablet, Rfl: 2 .  glucose blood test strip, Contour Ascensia test strips and lancets Use to check blood sugar daily for type 2 diabetes, E11.9., Disp: 100 each, Rfl: 4 .  insulin degludec (TRESIBA FLEXTOUCH) 100 UNIT/ML SOPN FlexTouch Pen, Inject 0.06 mLs (6 Units total) into the skin daily. Increase by 2 units a day if fasting glucose is over 200, Disp: 3 mL, Rfl: 0 .  isosorbide mononitrate (IMDUR) 60 MG 24 hr tablet, TAKE 1 TABLET BY MOUTH ONCE DAILY, Disp: 30 tablet, Rfl: 5 .  LORazepam (ATIVAN) 0.5 MG tablet, Take 1 tablet (0.5 mg total) by mouth at bedtime as needed for sleep., Disp: 30 tablet, Rfl: 3 .  losartan (COZAAR) 25 MG tablet, TAKE 1 TABLET BY MOUTH ONCE EVERY EVENING, Disp: 30 tablet, Rfl: 12 .  magnesium oxide (MAG-OX) 400 MG tablet, Take 400 mg by mouth daily., Disp: , Rfl:  .  metoprolol succinate (TOPROL-XL) 50 MG 24 hr tablet, TAKE 1 TABLET BY MOUTH ONCE DAILY, Disp: 30 tablet, Rfl: 12 .  pantoprazole (PROTONIX) 40 MG tablet, Take 1 tablet (40 mg total) by mouth daily., Disp: 30 tablet, Rfl: 12 .  polycarbophil (FIBERCON) 625 MG tablet, Take 625 mg by mouth as needed., Disp: , Rfl:  .  sodium bicarbonate 650 MG tablet, Take 650 mg by mouth  2 (two) times daily., Disp: , Rfl:  .  tamsulosin (FLOMAX) 0.4 MG CAPS capsule, TAKE 1 CAPSULE BY MOUTH ONCE DAILY 30 MINUTES AFTER LARGEST MEAL., Disp: 30 capsule, Rfl: 5 .  torsemide (DEMADEX) 100 MG tablet, Take 100 mg by mouth daily. Do not take on dialysis days, Disp: , Rfl:  .  traMADol (ULTRAM) 50 MG tablet, Take 1 tablet (50 mg total) by mouth every 6 (six) hours as needed (Pain)., Disp: 28 tablet, Rfl: 3 .  traZODone (DESYREL) 50 MG tablet, TAKE 1/2 TO 1 TABLET BY MOUTH AT BEDTIMEAS NEEDED SLEEP, Disp: 30 tablet, Rfl: 5  Review of Systems   Constitutional: Negative for appetite change, chills and fever.  Respiratory: Negative for chest tightness, shortness of breath and wheezing.   Cardiovascular: Positive for leg swelling. Negative for chest pain and palpitations.  Gastrointestinal: Negative for abdominal pain, nausea and vomiting.    Social History   Tobacco Use  . Smoking status: Former Smoker    Packs/day: 1.00    Years: 51.00    Pack years: 51.00    Types: Cigarettes    Quit date: 06/21/2017    Years since quitting: 1.4  . Smokeless tobacco: Never Used  . Tobacco comment: started smoking at age 20-23. Smoked for 50 years  Substance Use Topics  . Alcohol use: Not Currently    Alcohol/week: 0.0 standard drinks    Frequency: Never      Objective:   BP 124/62 (BP Location: Right Arm, Patient Position: Sitting, Cuff Size: Normal)   Pulse 65   Temp (!) 97.5 F (36.4 C) (Oral)   Resp 16   Wt 162 lb (73.5 kg)   SpO2 99% Comment: room air  BMI 26.15 kg/m  Vitals:   12/18/18 0810  BP: 124/62  Pulse: 65  Resp: 16  Temp: (!) 97.5 F (36.4 C)  TempSrc: Oral  SpO2: 99%  Weight: 162 lb (73.5 kg)  Body mass index is 26.15 kg/m.   Physical Exam   General Appearance:    Alert, cooperative, no distress  Eyes:    PERRL, conjunctiva/corneas clear, EOM's intact       Lungs:     Clear to auscultation bilaterally, respirations unlabored  Abdomen:   Mildly distended  Heart:    Normal heart rate. Normal rhythm. No murmurs, rubs, or gallops.   MS:   All extremities are intact.   Neurologic:   Awake, alert, oriented x 3. No apparent focal neurological           defect.         Assessment & Plan    1. Cirrhosis of liver with ascites, unspecified hepatic cirrhosis type (HCC) Greatly improved after paracentesis. Follow up with GI later today as scheduled. .   2. Pulmonary emphysema, unspecified emphysema type (HCC) Does not seem to be affecting respiratory function for now. Not requiring inhalers.   3. Need for  influenza vaccination  - Flu Vaccine QUAD High Dose(Fluad)  Return for diabetes follow up in about 2 months.     Donald Fisher, MD  [facility] 

## 2018-12-18 NOTE — Patient Instructions (Signed)
.   Please review the attached list of medications and notify my office if there are any errors.   . Please bring all of your medications to every appointment so we can make sure that our medication list is the same as yours.   . It is especially important to get the annual flu vaccine this year. If you haven't had it already, please go to your pharmacy or call the office as soon as possible to schedule you flu shot.  

## 2018-12-19 DIAGNOSIS — N186 End stage renal disease: Secondary | ICD-10-CM | POA: Diagnosis not present

## 2018-12-19 DIAGNOSIS — Z992 Dependence on renal dialysis: Secondary | ICD-10-CM | POA: Diagnosis not present

## 2018-12-21 DIAGNOSIS — N186 End stage renal disease: Secondary | ICD-10-CM | POA: Diagnosis not present

## 2018-12-21 DIAGNOSIS — Z992 Dependence on renal dialysis: Secondary | ICD-10-CM | POA: Diagnosis not present

## 2018-12-24 DIAGNOSIS — Z992 Dependence on renal dialysis: Secondary | ICD-10-CM | POA: Diagnosis not present

## 2018-12-24 DIAGNOSIS — N186 End stage renal disease: Secondary | ICD-10-CM | POA: Diagnosis not present

## 2018-12-25 ENCOUNTER — Ambulatory Visit (INDEPENDENT_AMBULATORY_CARE_PROVIDER_SITE_OTHER): Payer: Medicare Other

## 2018-12-25 ENCOUNTER — Encounter (INDEPENDENT_AMBULATORY_CARE_PROVIDER_SITE_OTHER): Payer: Self-pay | Admitting: Vascular Surgery

## 2018-12-25 ENCOUNTER — Other Ambulatory Visit: Payer: Self-pay

## 2018-12-25 ENCOUNTER — Ambulatory Visit (INDEPENDENT_AMBULATORY_CARE_PROVIDER_SITE_OTHER): Payer: Medicare Other | Admitting: Vascular Surgery

## 2018-12-25 VITALS — BP 131/64 | HR 63 | Resp 16 | Ht 66.0 in | Wt 154.0 lb

## 2018-12-25 DIAGNOSIS — Z992 Dependence on renal dialysis: Secondary | ICD-10-CM

## 2018-12-25 DIAGNOSIS — I739 Peripheral vascular disease, unspecified: Secondary | ICD-10-CM

## 2018-12-25 DIAGNOSIS — N186 End stage renal disease: Secondary | ICD-10-CM | POA: Diagnosis not present

## 2018-12-25 DIAGNOSIS — I10 Essential (primary) hypertension: Secondary | ICD-10-CM

## 2018-12-25 DIAGNOSIS — E1122 Type 2 diabetes mellitus with diabetic chronic kidney disease: Secondary | ICD-10-CM | POA: Diagnosis not present

## 2018-12-25 NOTE — Progress Notes (Signed)
MRN : [id number]  Thomas [name] is a 73 y.o. (05/06/1945) male who presents with chief complaint of  Chief Complaint  Patient presents with   Follow-up    ultrasound follow up  .  History of Present Illness: Patient returns today in follow up of multiple vascular issues.  The patient reports feeling much better after getting a paracentesis a couple weeks ago.  His legs feel better, his breathing is better, and he has more energy.  No fever or chills.  His access is working well without any major issues.  He has had a couple of issues where new techs have infiltrated him, but overall it is running quite well.  Duplex today shows a widely patent left brachiocephalic AV fistula. He is also followed for PAD.  He has had previous bilateral interventions in years past.  His legs are little swollen but they actually feel a lot better after his paracentesis.  No rest pain or ulceration. Noninvasive studies today show stable ABIs of 0.81 on the left and 0.87 on the right with digit pressures in the 70s bilaterally.    Current Outpatient Medications  Medication Sig Dispense Refill   acetaminophen (TYLENOL) 500 MG tablet Take 500 mg by mouth every 8 (eight) hours as needed for mild pain or headache.     ALPRAZolam (XANAX) 0.25 MG tablet Take 1-2 tablets (0.25-0.5 mg total) by mouth daily as needed for anxiety. 60 tablet 3   atorvastatin (LIPITOR) 80 MG tablet TAKE 1 TABLET BY MOUTH ONCE DAILY 30 tablet 5   B Complex-C-Folic Acid (RENA-VITE PO) Take by mouth daily.     Blood Glucose Monitoring Suppl (ONE TOUCH ULTRA 2) w/Device KIT Use to check gluocose daily for type 2 diabetes 1 each 0   calcium acetate (PHOSLO) 667 MG capsule TAKE ONE CAPSULE BY MOUTH THREE TIMES DAILY WITH MEALS 276 capsule 4   clopidogrel (PLAVIX) 75 MG tablet TAKE 1 TABLET BY MOUTH ONCE DAILY 30 tablet 2   glucose blood test strip Contour Ascensia test strips and lancets Use to check blood sugar daily for type 2  diabetes, E11.9. 100 each 4   insulin degludec (TRESIBA FLEXTOUCH) 100 UNIT/ML SOPN FlexTouch Pen Inject 0.06 mLs (6 Units total) into the skin daily. Increase by 2 units a day if fasting glucose is over 200 3 mL 0   isosorbide mononitrate (IMDUR) 60 MG 24 hr tablet TAKE 1 TABLET BY MOUTH ONCE DAILY 30 tablet 5   LORazepam (ATIVAN) 0.5 MG tablet Take 1 tablet (0.5 mg total) by mouth at bedtime as needed for sleep. 30 tablet 3   losartan (COZAAR) 25 MG tablet TAKE 1 TABLET BY MOUTH ONCE EVERY EVENING 30 tablet 12   magnesium oxide (MAG-OX) 400 MG tablet Take 400 mg by mouth daily.     metoprolol succinate (TOPROL-XL) 50 MG 24 hr tablet TAKE 1 TABLET BY MOUTH ONCE DAILY 30 tablet 12   pantoprazole (PROTONIX) 40 MG tablet Take 1 tablet (40 mg total) by mouth daily. 30 tablet 12   polycarbophil (FIBERCON) 625 MG tablet Take 625 mg by mouth as needed.     sodium bicarbonate 650 MG tablet Take 650 mg by mouth 2 (two) times daily.     tamsulosin (FLOMAX) 0.4 MG CAPS capsule TAKE 1 CAPSULE BY MOUTH ONCE DAILY 30 MINUTES AFTER LARGEST MEAL. 30 capsule 5   torsemide (DEMADEX) 100 MG tablet Take 100 mg by mouth daily. Do not take on dialysis days  traMADol (ULTRAM) 50 MG tablet Take 1 tablet (50 mg total) by mouth every 6 (six) hours as needed (Pain). 28 tablet 3   traZODone (DESYREL) 50 MG tablet TAKE 1/2 TO 1 TABLET BY MOUTH AT BEDTIMEAS NEEDED SLEEP 30 tablet 5   No current facility-administered medications for this visit.     Past Medical History:  Diagnosis Date   Allergy    Arthritis    CAD (coronary artery disease)    Chronic kidney disease    Diabetes mellitus without complication (HCC)    Dyspnea    Erosive esophagitis    ESRD (end stage renal disease) on dialysis ([place]) 2019   MWF, Left arm fistula   GERD (gastroesophageal reflux disease)    Gouty arthropathy    History of colonic polyps    History of kidney stones    History of MRSA infection     Hyperkalemia    Hyperlipidemia    Hypertension    Melanoma ([employer])    Myocardial infarction ([place]) 1986, 2019   Peripheral vascular disease (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   Trigger finger of left hand    Ureteral tumor     Past Surgical History:  Procedure Laterality Date   A/V FISTULAGRAM Left 08/24/2018   Procedure: A/V FISTULAGRAM;  Surgeon: Katha Cabal, MD;  Location: [place] [facility];  Service: Cardiovascular;  Laterality: Left;   A/V SHUNT INTERVENTION Left 03/12/2018   Procedure: A/V SHUNT INTERVENTION;  Surgeon: Algernon Huxley, MD;  Location: [place] [facility];  Service: Cardiovascular;  Laterality: Left;   AV FISTULA PLACEMENT Left 12/07/2017   Procedure: ARTERIOVENOUS (AV) FISTULA CREATION ( BRACHIOBASILIC STAGE );  Surgeon: Algernon Huxley, MD;  Location: [facility] ORS;  Service: Vascular;  Laterality: Left;   CATARACT EXTRACTION     COLONOSCOPY WITH PROPOFOL N/A 11/06/2018   Procedure: COLONOSCOPY WITH PROPOFOL;  Surgeon: Lollie Sails, MD;  Location: [facility];  Service: Endoscopy;  Laterality: N/A;   CORONARY ARTERY BYPASS GRAFT  1996   CORONARY STENT INTERVENTION N/A 05/29/2017   Procedure: CORONARY STENT INTERVENTION;  Surgeon: Isaias Cowman, MD;  Location: [place] [facility];  Service: Cardiovascular;  Laterality: N/A;   DIALYSIS/PERMA CATHETER REMOVAL N/A 04/12/2018   Procedure: DIALYSIS/PERMA CATHETER REMOVAL;  Surgeon: Algernon Huxley, MD;  Location: [place] [facility];  Service: Cardiovascular;  Laterality: N/A;   ESOPHAGOGASTRODUODENOSCOPY (EGD) WITH PROPOFOL N/A 11/06/2018   Procedure: ESOPHAGOGASTRODUODENOSCOPY (EGD) WITH PROPOFOL;  Surgeon: Lollie Sails, MD;  Location: [facility] ENDOSCOPY;  Service: Endoscopy;  Laterality: N/A;   EXCISION Ureteral Tumor  2010?   (benign) Dr. Quillian Quince   EYE SURGERY Bilateral    Cataract Extraction with IOL   LEFT HEART CATH AND CORONARY ANGIOGRAPHY N/A 10/18/2016     Procedure: Left Heart Cath and Coronary Angiography;  Surgeon: Isaias Cowman, MD;  Location: [place] [facility];  Service: Cardiovascular;  Laterality: N/A;   LEFT HEART CATH AND CORONARY ANGIOGRAPHY N/A 05/29/2017   Procedure: LEFT HEART CATH AND CORONARY ANGIOGRAPHY;  Surgeon: Isaias Cowman, MD;  Location: [place] [facility];  Service: Cardiovascular;  Laterality: N/A;   LOWER EXTREMITY ANGIOGRAPHY Right 10/31/2016   Procedure: Lower Extremity Angiography;  Surgeon: Algernon Huxley, MD;  Location: [place] [facility];  Service: Cardiovascular;  Laterality: Right;   LOWER EXTREMITY ANGIOGRAPHY Left 11/14/2016   Procedure: Lower Extremity Angiography;  Surgeon: Algernon Huxley, MD;  Location: [place]  CV LAB;  Service: Cardiovascular;  Laterality: Left;   LOWER EXTREMITY INTERVENTION  11/14/2016   Procedure: Lower Extremity Intervention;  Surgeon: Algernon Huxley, MD;  Location: [place] [facility];  Service: Cardiovascular;;   open heart surgery     RETINAL DETACHMENT SURGERY Right November 2107    Social History Social History   Tobacco Use   Smoking status: Former Smoker    Packs/day: 1.00    Years: 51.00    Pack years: 51.00    Types: Cigarettes    Quit date: 06/21/2017    Years since quitting: 1.5   Smokeless tobacco: Never Used   Tobacco comment: started smoking at age [age]. Smoked for 50 years  Substance Use Topics   Alcohol use: Not Currently    Alcohol/week: 0.0 standard drinks    Frequency: Never   Drug use: No      Family History Family History  Problem Relation Age of Onset   Alzheimer's disease Mother    Alzheimer's disease Brother    Lung cancer Paternal Grandfather      Allergies  Allergen Reactions   Ciprofloxacin Itching and Swelling    Facial swelling    Hydralazine Nausea And Vomiting   Trulicity [Dulaglutide] Other (See Comments)    Bloating    REVIEW OF SYSTEMS(Negative unless checked)  Constitutional:  [] ??Weight loss[] ??Fever[] ??Chills Cardiac:[] ??Chest pain[] ??Chest pressure[] ??Palpitations [] ??Shortness of breath when laying flat [] ??Shortness of breath at rest [] ??Shortness of breath with exertion. Vascular: [x] ??Pain in legs with walking[] ??Pain in legsat rest[] ??Pain in legs when laying flat [x] ??Claudication [] ??Pain in feet when walking [] ??Pain in feet at rest [] ??Pain in feet when laying flat [] ??History of DVT [] ??Phlebitis [x] ??Swelling in legs [] ??Varicose veins [] ??Non-healing ulcers Pulmonary: [] ??Uses home oxygen [] ??Productive cough[] ??Hemoptysis [] ??Wheeze [] ??COPD [] ??Asthma Neurologic: [] ??Dizziness [] ??Blackouts [] ??Seizures [] ??History of stroke [] ??History of TIA[] ??Aphasia [] ??Temporary blindness[] ??Dysphagia [] ??Weaknessor numbness in arms [] ??Weakness or numbnessin legs Musculoskeletal: [x] ??Arthritis [] ??Joint swelling [x] ??Joint pain [] ??Low back pain Hematologic:[] ??Easy bruising[] ??Easy bleeding [] ??Hypercoagulable state [] ??Anemic  Gastrointestinal:[] ??Blood in stool[] ??Vomiting blood[] ??Gastroesophageal reflux/heartburn[] ??Abdominal pain Genitourinary: [x] ??Chronic kidney disease [] ??Difficulturination [] ??Frequenturination [] ??Burning with urination[] ??Hematuria Skin: [] ??Rashes [] ??Ulcers [] ??Wounds Psychological: [] ??History of anxiety[] ??History of major depression.   Physical Examination  BP 131/64 (BP Location: Right Arm)    Pulse 63    Resp 16    Ht 5' 6"  (1.676 m)    Wt 154 lb (69.9 kg)    BMI 24.86 kg/m  Gen:  WD/WN, NAD Head: Crisp/AT, No temporalis wasting. Ear/Nose/Throat: Hearing grossly intact, nares w/o erythema or drainage Eyes: Conjunctiva clear. Sclera non-icteric Neck: Supple.  Trachea midline Pulmonary:  Good air movement, no use of accessory muscles.  Cardiac: Somewhat irregular Vascular: Strong thrill in a mature left  brachiocephalic AV fistula Vessel Right Left  Radial Palpable Palpable                          PT  not palpable  not palpable  DP  1+ palpable  1+ palpable   Gastrointestinal: soft, non-tender/non-distended. No guarding/reflex.  Musculoskeletal: M/S 5/5 throughout.  No deformity or atrophy.  1+ bilateral lower extremity edema. Neurologic: Sensation grossly intact in extremities.  Symmetrical.  Speech is fluent.  Psychiatric: Judgment intact, Mood & affect appropriate for pt's clinical situation. Dermatologic: No rashes or ulcers noted.  No cellulitis or open wounds.       Labs Recent Results (from the past 2160 hour(s))  Comprehensive metabolic panel     Status: Abnormal   Collection Time: 09/27/18  9:55 AM  Result Value Ref Range   Glucose 144 (H) 65 - 99 mg/dL   BUN 40 (H) 8 - 27 mg/dL   Creatinine, Ser 2.66 (H) 0.76 - 1.27 mg/dL   GFR calc non Af Amer 23 (L) >59 mL/min/1.73   GFR calc Af Amer 26 (L) >59 mL/min/1.73   BUN/Creatinine Ratio 15 10 - 24   Sodium 133 (L) 134 - 144 mmol/L   Potassium 3.8 3.5 - 5.2 mmol/L   Chloride 94 (L) 96 - 106 mmol/L   CO2 24 20 - 29 mmol/L   Calcium 8.6 8.6 - 10.2 mg/dL   Total Protein 6.6 6.0 - 8.5 g/dL   Albumin 3.2 (L) 3.7 - 4.7 g/dL   Globulin, Total 3.4 1.5 - 4.5 g/dL   Albumin/Globulin Ratio 0.9 (L) 1.2 - 2.2   Bilirubin Total 0.7 0.0 - 1.2 mg/dL   Alkaline Phosphatase 336 (H) 39 - 117 IU/L   AST 22 0 - 40 IU/L   ALT 14 0 - 44 IU/L  Lipid panel     Status: Abnormal   Collection Time: 09/27/18  9:55 AM  Result Value Ref Range   Cholesterol, Total 102 100 - 199 mg/dL   Triglycerides 100 0 - 149 mg/dL   HDL 35 (L) >39 mg/dL   VLDL Cholesterol Cal 20 5 - 40 mg/dL   LDL Calculated 47 0 - 99 mg/dL   Chol/HDL Ratio 2.9 0.0 - 5.0 ratio    Comment:                                   T. Chol/HDL Ratio                                             Men  Women                               1/2 Avg.Risk  3.4    3.3                                    Avg.Risk  5.0    4.4                                2X Avg.Risk  9.6    7.1                                3X Avg.Risk 23.4   11.0   PSA     Status: None   Collection Time: 09/27/18  9:55 AM  Result Value Ref Range   Prostate Specific Ag, Serum 0.5 0.0 - 4.0 ng/mL    Comment: Roche ECLIA methodology. According to the American Urological Association, Serum PSA should decrease and remain at undetectable levels after radical prostatectomy. The AUA defines biochemical recurrence as an initial PSA value 0.2 ng/mL or greater followed by a subsequent confirmatory PSA value 0.2 ng/mL or greater. Values obtained with different assay methods or kits cannot be used interchangeably. Results cannot be interpreted as absolute evidence of the presence or absence of  malignant disease.   Hemoglobin A1c     Status: Abnormal   Collection Time: 09/27/18  9:55 AM  Result Value Ref Range   Hgb A1c MFr Bld 7.9 (H) 4.8 - 5.6 %    Comment:          Prediabetes: 5.7 - 6.4          Diabetes: >6.4          Glycemic control for adults with diabetes: <7.0    Est. average glucose Bld gHb Est-mCnc 180 mg/dL  CBC     Status: Abnormal   Collection Time: 09/27/18  9:55 AM  Result Value Ref Range   WBC 9.4 3.4 - 10.8 x10E3/uL   RBC 3.86 (L) 4.14 - 5.80 x10E6/uL   Hemoglobin 11.9 (L) 13.0 - 17.7 g/dL   Hematocrit 35.8 (L) 37.5 - 51.0 %   MCV 93 79 - 97 fL   MCH 30.8 26.6 - 33.0 pg   MCHC 33.2 31.5 - 35.7 g/dL   RDW 15.3 11.6 - 15.4 %   Platelets 260 150 - 450 x10E3/uL  SARS Coronavirus 2 (Performed in [place] hospital lab)     Status: None   Collection Time: 11/02/18 10:16 AM   Specimen: Nasal Swab  Result Value Ref Range   SARS Coronavirus 2 NEGATIVE NEGATIVE    Comment: (NOTE) SARS-CoV-2 target nucleic acids are NOT DETECTED. The SARS-CoV-2 RNA is generally detectable in upper and lower respiratory specimens during the acute phase of infection. Negative results do not preclude  SARS-CoV-2 infection, do not rule out co-infections with other pathogens, and should not be used as the sole basis for treatment or other patient management decisions. Negative results must be combined with clinical observations, patient history, and epidemiological information. The expected result is Negative. Fact Sheet for Patients: SugarRoll.be Fact Sheet for Healthcare Providers: https://www.woods-mathews.com/ This test is not yet approved or cleared by the [place] FDA and  has been authorized for detection and/or diagnosis of SARS-CoV-2 by FDA under an Emergency Use Authorization (EUA). This EUA will remain  in effect (meaning this test can be used) for the duration of the COVID-19 declaration under Section 56 4(b)(1) of the Act, 21 U.S.C. section 360bbb-3(b)(1), unless the authorization is terminated or revoked sooner. Performed at [facility], [place] [address] [place], [place] [address]   Glucose, capillary     Status: Abnormal   Collection Time: 11/06/18  6:52 AM  Result Value Ref Range   Glucose-Capillary 130 (H) 70 - 99 mg/dL  Surgical pathology     Status: None   Collection Time: 11/06/18  7:57 AM  Result Value Ref Range   SURGICAL PATHOLOGY      Surgical Pathology CASE: [id number] PATIENT: Leslie Andrea Surgical Pathology Report     SPECIMEN SUBMITTED: A. Stomach, antrum; cbx B. Stomach, body; cbx C. GEJ; cbx D. Colon polyp x2, distal transverse; cold snare E. Colon polyp x2, ascending; cold snare F. Colon polyp, distal sigmoid; cbx G. Colon polyp, mid descending; cold snare  CLINICAL HISTORY: None provided  PRE-OPERATIVE DIAGNOSIS: Dysphagia, personal HX of colon polyps  POST-OPERATIVE DIAGNOSIS: Gastritis, Cameron erosions, sliding hiatal hernia, presbyesophagus, colon polyp, internal hemorrhoids    DIAGNOSIS: A. STOMACH, ANTRUM; COLD BIOPSY: - GASTRIC ANTRAL MUCOSA WITH FEATURES OF  REACTIVE GASTROPATHY. - NEGATIVE FOR H. PYLORI, DYSPLASIA, AND MALIGNANCY.  Comment: Sections display antral mucosa with foveolar hyperplasia and mild lamina propria fibrosis.  The differential diagnosis for these findings includes drug/chemical injury (  NSAID vs. other), bile reflux, and changes adjacent t o an area of healing ulceration. Clinical correlation with endoscopic findings is required.  B. STOMACH, BODY; COLD BIOPSY: - GASTRIC OXYNTIC MUCOSA WITH MILD GLAND HYPERPLASIA, SUGGESTIVE OF PPI EFFECT. - NEGATIVE FOR H. PYLORI, DYSPLASIA, AND MALIGNANCY.  C. GASTROESOPHAGEAL JUNCTION; COLD BIOPSY: - COLUMNAR MUCOSA WITH INTESTINAL METAPLASIA. - SQUAMOUS MUCOSA WITH FEATURES OF MILD REFLUX GASTROESOPHAGITIS. - NEGATIVE FOR DYSPLASIA AND MALIGNANCY.  COMMENT: The gastroesophageal junction shows gastric type mucosa with scattered goblet cells. The diagnosis in this case depends on the location of this biopsy. If this biopsy was taken from the tubular esophagus at least 1 cm above the gastric fold it shows Barrett's mucosa of the distinctive type. If this biopsy was taken from the gastric cardia it shows intestinal metaplasia of the gastric cardia. Per The [employer] of Gastroenterology 2016 guidelines Barrett esophagus should be diagnosed when there is extensi on of salmon-colored mucosa into the tubular esophagus extending greater than or equal to 1 cm proximal to the gastroesophageal junction with biopsy confirmation of intestinal metaplasia.  Reference: Natasha Mead Gastroenterol advance online publication, 18 February 2014  D. COLON POLYPS X2, DISTAL TRANSVERSE; COLD SNARE: - FRAGMENTS (X3) OF TUBULAR ADENOMAS. - NEGATIVE FOR HIGH-GRADE DYSPLASIA AND MALIGNANCY.  E. COLON POLYPS X2, ASCENDING; COLD SNARE: - FRAGMENTS (X3) OF TUBULAR ADENOMAS. - NEGATIVE FOR HIGH-GRADE DYSPLASIA AND MALIGNANCY.  F. COLON POLYP, DISTAL SIGMOID; COLD BIOPSY: - HYPERPLASTIC POLYP. -  NEGATIVE FOR DYSPLASIA AND MALIGNANCY.  G. COLON POLYP, MID DESCENDING; COLD SNARE: - TUBULAR ADENOMA. - NEGATIVE FOR HIGH-GRADE DYSPLASIA AND MALIGNANCY.  GROSS DESCRIPTION: A. Labeled: C BX gastric antrum Received: Formalin Tissue fragment(s): 2 Size: 0.3-0.4 cm Description: Tan soft tissue fragments Entirely submitted in 1 cassette.  B. Labeled: C BX gas tric body Received: Formalin Tissue fragment(s): 3 Size: 0.2-0.4 cm Description: Tan soft tissue fragments Entirely submitted in 1 cassette.  C. Labeled: C BX GEJ Received: Formalin Tissue fragment(s): 1 Size: 0.3 cm Description: Tan soft tissue fragment Entirely submitted in 1 cassette.  D. Labeled: Cold snare distal transverse colon polyp x2 Received: Formalin Tissue fragment(s): Multiple Size: Aggregate, 0.7 x 0.3 x 0.2 cm Description: Tan soft tissue fragments admixed with minimal, fecal material Entirely submitted in 1 cassette.  E. Labeled: Cold snare ascending colon polyp x2 Received: Formalin Tissue fragment(s): Multiple Size: Aggregate, 2.5 x 0.5 x 0.2 cm Description: Tan soft tissue fragments admixed with fecal material Entirely submitted in 1 cassette.  F. Labeled: C BX distal sigmoid colon polyp Received: Formalin Tissue fragment(s): 2 Size: 0.2-0.3 cm Description: Tan soft tissue fragments Entirely submitted in 1 cassette.  G. Labeled: C old snare mid descending colon polyp Received: Formalin Tissue fragment(s): Multiple Size: Aggregate, 0.9 x 0.5 x 0.3 cm Description: Tan soft tissue fragments admixed with fecal material Entirely submitted in 1 cassette.   Final Diagnosis performed by Allena Napoleon, MD.   Electronically signed 11/07/2018 2:18:16PM The electronic signature indicates that the named Attending Pathologist has evaluated the specimen  Technical component performed at [facility], [address], [place], [place] [address] Lab: [phone] Dir: Rush Farmer, MD, MMM   Professional component performed at [facility], [facility], [place], [place],  [address] Lab: [phone] Dir: Dellia Nims. Rubinas, MD   Protime-INR     Status: Abnormal   Collection Time: 11/29/18 10:07 AM  Result Value Ref Range   Prothrombin Time 15.8 (H) 11.4 - 15.2 seconds   INR 1.3 (H) 0.8 -  1.2    Comment: (NOTE) INR goal varies based on device and disease states. Performed at [facility], [address] [place], [place] [address]   CBC with Differential/Platelet     Status: Abnormal   Collection Time: 11/29/18 10:07 AM  Result Value Ref Range   WBC 8.1 4.0 - 10.5 K/uL   RBC 3.81 (L) 4.22 - 5.81 MIL/uL   Hemoglobin 11.7 (L) 13.0 - 17.0 g/dL   HCT 36.6 (L) 39.0 - 52.0 %   MCV 96.1 80.0 - 100.0 fL   MCH 30.7 26.0 - 34.0 pg   MCHC 32.0 30.0 - 36.0 g/dL   RDW 17.0 (H) 11.5 - 15.5 %   Platelets 224 150 - 400 K/uL   nRBC 0.0 0.0 - 0.2 %   Neutrophils Relative % 71 %   Neutro Abs 5.7 1.7 - 7.7 K/uL   Lymphocytes Relative 10 %   Lymphs Abs 0.8 0.7 - 4.0 K/uL   Monocytes Relative 12 %   Monocytes Absolute 1.0 0.1 - 1.0 K/uL   Eosinophils Relative 6 %   Eosinophils Absolute 0.5 0.0 - 0.5 K/uL   Basophils Relative 1 %   Basophils Absolute 0.1 0.0 - 0.1 K/uL   Immature Granulocytes 0 %   Abs Immature Granulocytes 0.02 0.00 - 0.07 K/uL    Comment: Performed at [facility], [address] [place], [place] [address]  Comprehensive metabolic panel     Status: Abnormal   Collection Time: 11/29/18 10:07 AM  Result Value Ref Range   Sodium 138 135 - 145 mmol/L   Potassium 4.2 3.5 - 5.1 mmol/L   Chloride 96 (L) 98 - 111 mmol/L   CO2 28 22 - 32 mmol/L   Glucose, Bld 145 (H) 70 - 99 mg/dL   BUN 35 (H) 8 - 23 mg/dL   Creatinine, Ser 2.89 (H) 0.61 - 1.24 mg/dL   Calcium 8.9 8.9 - 10.3 mg/dL   Total Protein 7.0 6.5 - 8.1 g/dL   Albumin 2.9 (L) 3.5 - 5.0 g/dL   AST 22 15 - 41 U/L   ALT 20 0 - 44 U/L   Alkaline Phosphatase 282  (H) 38 - 126 U/L   Total Bilirubin 1.1 0.3 - 1.2 mg/dL   GFR calc non Af Amer 20 (L) >60 mL/min   GFR calc Af Amer 24 (L) >60 mL/min   Anion gap 14 5 - 15    Comment: Performed at [facility], [address] [place], [place] [address]  Basic metabolic panel     Status: Abnormal   Collection Time: 12/06/18  5:03 PM  Result Value Ref Range   Sodium 134 (L) 135 - 145 mmol/L   Potassium 3.4 (L) 3.5 - 5.1 mmol/L   Chloride 97 (L) 98 - 111 mmol/L   CO2 26 22 - 32 mmol/L   Glucose, Bld 237 (H) 70 - 99 mg/dL   BUN 40 (H) 8 - 23 mg/dL   Creatinine, Ser 3.12 (H) 0.61 - 1.24 mg/dL   Calcium 8.7 (L) 8.9 - 10.3 mg/dL   GFR calc non Af Amer 19 (L) >60 mL/min   GFR calc Af Amer 22 (L) >60 mL/min   Anion gap 11 5 - 15    Comment: Performed at [facility], [address] [place], [place] [address]  CBC     Status: Abnormal   Collection Time: 12/06/18  5:03 PM  Result Value Ref Range   WBC 8.1 4.0 -  10.5 K/uL   RBC 3.74 (L) 4.22 - 5.81 MIL/uL   Hemoglobin 11.5 (L) 13.0 - 17.0 g/dL   HCT 36.3 (L) 39.0 - 52.0 %   MCV 97.1 80.0 - 100.0 fL   MCH 30.7 26.0 - 34.0 pg   MCHC 31.7 30.0 - 36.0 g/dL   RDW 16.7 (H) 11.5 - 15.5 %   Platelets 223 150 - 400 K/uL   nRBC 0.0 0.0 - 0.2 %    Comment: Performed at [facility], [place], [place] [address]  Troponin I (High Sensitivity)     Status: Abnormal   Collection Time: 12/06/18  5:03 PM  Result Value Ref Range   Troponin I (High Sensitivity) 46 (H) <18 ng/L    Comment: (NOTE) Elevated high sensitivity troponin I (hsTnI) values and significant  changes across serial measurements may suggest ACS but many other  chronic and acute conditions are known to elevate hsTnI results.  Refer to the "Links" section for chest pain algorithms and additional  guidance. Performed at [facility], [address] [place], [place] [address]   Protime-INR     Status: Abnormal   Collection Time: 12/06/18  7:17  PM  Result Value Ref Range   Prothrombin Time 15.7 (H) 11.4 - 15.2 seconds   INR 1.3 (H) 0.8 - 1.2    Comment: (NOTE) INR goal varies based on device and disease states. Performed at [facility], [place], [place] [address]   Troponin I (High Sensitivity)     Status: Abnormal   Collection Time: 12/06/18  7:17 PM  Result Value Ref Range   Troponin I (High Sensitivity) 49 (H) <18 ng/L    Comment: (NOTE) Elevated high sensitivity troponin I (hsTnI) values and significant  changes across serial measurements may suggest ACS but many other  chronic and acute conditions are known to elevate hsTnI results.  Refer to the "Links" section for chest pain algorithms and additional  guidance. Performed at [facility], [address] [place], [place] [address]   Magnesium     Status: None   Collection Time: 12/06/18  7:17 PM  Result Value Ref Range   Magnesium 1.8 1.7 - 2.4 mg/dL    Comment: Performed at [facility], [address] [place], [place] [address]  APTT     Status: Abnormal   Collection Time: 12/06/18  7:17 PM  Result Value Ref Range   aPTT 46 (H) 24 - 36 seconds    Comment:        IF BASELINE aPTT IS ELEVATED, SUGGEST PATIENT RISK ASSESSMENT BE USED TO DETERMINE APPROPRIATE ANTICOAGULANT THERAPY. Performed at [facility], [address] [place], [place] [address]   Urinalysis, Routine w reflex microscopic     Status: Abnormal   Collection Time: 12/06/18  8:25 PM  Result Value Ref Range   Color, Urine YELLOW (A) YELLOW   APPearance HAZY (A) CLEAR   Specific Gravity, Urine 1.015 1.005 - 1.030   pH 5.0 5.0 - 8.0   Glucose, UA 50 (A) NEGATIVE mg/dL   Hgb urine dipstick SMALL (A) NEGATIVE   Bilirubin Urine NEGATIVE NEGATIVE   Ketones, ur NEGATIVE NEGATIVE mg/dL   Protein, ur 100 (A) NEGATIVE mg/dL   Nitrite NEGATIVE NEGATIVE   Leukocytes,Ua NEGATIVE NEGATIVE   RBC / HPF 0-5 0 - 5 RBC/hpf   WBC, UA 0-5 0 - 5  WBC/hpf   Bacteria, UA RARE (A) NONE SEEN  Squamous Epithelial / LPF NONE SEEN 0 - 5   Mucus PRESENT    Hyaline Casts, UA PRESENT     Comment: Performed at [facility], [address] [place], [place] [address]  SARS Coronavirus 2 [facility] order, Performed in [facility] hospital lab) Nasopharyngeal Nasopharyngeal Swab     Status: None   Collection Time: 12/06/18  8:55 PM   Specimen: Nasopharyngeal Swab  Result Value Ref Range   SARS Coronavirus 2 NEGATIVE NEGATIVE    Comment: (NOTE) If result is NEGATIVE SARS-CoV-2 target nucleic acids are NOT DETECTED. The SARS-CoV-2 RNA is generally detectable in upper and lower  respiratory specimens during the acute phase of infection. The lowest  concentration of SARS-CoV-2 viral copies this assay can detect is 250  copies / mL. A negative result does not preclude SARS-CoV-2 infection  and should not be used as the sole basis for treatment or other  patient management decisions.  A negative result may occur with  improper specimen collection / handling, submission of specimen other  than nasopharyngeal swab, presence of viral mutation(s) within the  areas targeted by this assay, and inadequate number of viral copies  (<250 copies / mL). A negative result must be combined with clinical  observations, patient history, and epidemiological information. If result is POSITIVE SARS-CoV-2 target nucleic acids are DETECTED. The SARS-CoV-2 RNA is generally detectable in upper and lower  respiratory specimens dur ing the acute phase of infection.  Positive  results are indicative of active infection with SARS-CoV-2.  Clinical  correlation with patient history and other diagnostic information is  necessary to determine patient infection status.  Positive results do  not rule out bacterial infection or co-infection with other viruses. If result is PRESUMPTIVE POSTIVE SARS-CoV-2 nucleic acids MAY BE PRESENT.   A presumptive positive result  was obtained on the submitted specimen  and confirmed on repeat testing.  While 2019 novel coronavirus  (SARS-CoV-2) nucleic acids may be present in the submitted sample  additional confirmatory testing may be necessary for epidemiological  and / or clinical management purposes  to differentiate between  SARS-CoV-2 and other Sarbecovirus currently known to infect humans.  If clinically indicated additional testing with an alternate test  methodology [phone]) is advised. The SARS-CoV-2 RNA is generally  detectable in upper and lower respiratory sp ecimens during the acute  phase of infection. The expected result is Negative. Fact Sheet for Patients:  StrictlyIdeas.no Fact Sheet for Healthcare Providers: BankingDealers.co.za This test is not yet approved or cleared by the [place] FDA and has been authorized for detection and/or diagnosis of SARS-CoV-2 by FDA under an Emergency Use Authorization (EUA).  This EUA will remain in effect (meaning this test can be used) for the duration of the COVID-19 declaration under Section 564(b)(1) of the Act, 21 U.S.C. section 360bbb-3(b)(1), unless the authorization is terminated or revoked sooner. Performed at [facility], [place], [place] [address]   Glucose, capillary     Status: Abnormal   Collection Time: 12/07/18 12:53 AM  Result Value Ref Range   Glucose-Capillary 165 (H) 70 - 99 mg/dL   Comment 1 Notify RN   Glucose, capillary     Status: Abnormal   Collection Time: 12/07/18  5:58 AM  Result Value Ref Range   Glucose-Capillary 144 (H) 70 - 99 mg/dL   Comment 1 Notify RN   Comprehensive metabolic panel     Status: Abnormal   Collection Time: 12/07/18  6:37 AM  Result Value Ref Range   Sodium 134 (L) 135 - 145 mmol/L   Potassium 3.4 (L) 3.5 - 5.1 mmol/L   Chloride 101 98 - 111 mmol/L   CO2 26 22 - 32 mmol/L   Glucose, Bld 139 (H) 70 - 99 mg/dL   BUN 45 (H)  8 - 23 mg/dL   Creatinine, Ser 3.21 (H) 0.61 - 1.24 mg/dL   Calcium 8.6 (L) 8.9 - 10.3 mg/dL   Total Protein 6.4 (L) 6.5 - 8.1 g/dL   Albumin 2.6 (L) 3.5 - 5.0 g/dL   AST 21 15 - 41 U/L   ALT 16 0 - 44 U/L   Alkaline Phosphatase 240 (H) 38 - 126 U/L   Total Bilirubin 0.9 0.3 - 1.2 mg/dL   GFR calc non Af Amer 18 (L) >60 mL/min   GFR calc Af Amer 21 (L) >60 mL/min   Anion gap 7 5 - 15    Comment: Performed at [facility], [address] [place], [place] [address]  CBC     Status: Abnormal   Collection Time: 12/07/18  6:37 AM  Result Value Ref Range   WBC 7.4 4.0 - 10.5 K/uL   RBC 3.67 (L) 4.22 - 5.81 MIL/uL   Hemoglobin 11.3 (L) 13.0 - 17.0 g/dL   HCT 34.3 (L) 39.0 - 52.0 %   MCV 93.5 80.0 - 100.0 fL   MCH 30.8 26.0 - 34.0 pg   MCHC 32.9 30.0 - 36.0 g/dL   RDW 16.6 (H) 11.5 - 15.5 %   Platelets 214 150 - 400 K/uL   nRBC 0.0 0.0 - 0.2 %    Comment: Performed at [facility], [address] [place], [place] [address]  Cytology - Non PAP;     Status: None   Collection Time: 12/07/18  1:20 PM  Result Value Ref Range   CYTOLOGY - NON GYN      Cytology - Non PAP CASE: [id number] PATIENT: Teddrick Lubben Non-Gyn Cytology Report     SPECIMEN SUBMITTED: A. Ascites  CLINICAL HISTORY: None provided  PRE-OPERATIVE DIAGNOSIS: None provided  POST-OPERATIVE DIAGNOSIS: None provided.     DIAGNOSIS: A. ASCITES FLUID; PARACENTESIS: - REACTIVE MESOTHELIAL CELLS AND MIXED INFLAMMATION. - NEUTROPHIL COUNT 30/MM.  Slides reviewed: 1 ThinPrep; 1 cytospin and one cell block   GROSS DESCRIPTION: A. Labeled: Ascites Received: Fresh Volume: 1000 cc Description: Light yellow semitransparent fluid Submitted for ThinPrep and cell block   Final Diagnosis performed by Raynelle Bring, MD.   Electronically signed 12/11/2018 2:11:22PM The electronic signature indicates that the named Attending Pathologist has evaluated the specimen  Technical component  performed at [facility], [address], [place], [place] [address] Lab: [phone] Dir: Rush Farmer, MD, MMM  Professional component performed at [facility], [place], [place], [place] [address] Lab: [id number] Dir: Dellia Nims. Rubinas, MD   Lactate dehydrogenase (pleural or peritoneal fluid)     Status: Abnormal   Collection Time: 12/07/18  1:20 PM  Result Value Ref Range   LD, Fluid 72 (H) 3 - 23 U/L    Comment: (NOTE) Results should be evaluated in conjunction with serum values    Fluid Type-FLDH PERITONEAL     Comment: Performed at [facility], [address] [place], [place] [address] CORRECTED ON 08/21 AT 1412: PREVIOUSLY REPORTED AS CYTOPERI   Triglycerides, Body Fluid     Status: None   Collection Time: 12/07/18  1:20 PM  Result Value Ref  Range   Triglycerides, Fluid 51 Not Estab. mg/dL    Comment: (NOTE) The reference intervals and other method performance specifications have not been established for this test. The test result should be integrated into the clinical context for interpretation. The reference interval(s) and other method performance specifications have not been established for this body fluid. The test result must be integrated into the clinical context for interpretation. Performed At: [facility] [address] [place], [place] [id number] Rush Farmer MD IW:[id number]    Fluid Type-FTRIG PERITONEAL     Comment: Performed at [facility], [address] [place], [place] [address] CORRECTED ON 08/21 AT 1412: PREVIOUSLY REPORTED AS CYTOPERI   Body fluid cell count with differential     Status: Abnormal   Collection Time: 12/07/18  1:20 PM  Result Value Ref Range   Fluid Type-FCT PERITONEAL     Comment: CORRECTED ON 08/21 AT 1412: PREVIOUSLY REPORTED AS CYTOPERI   Color, Fluid YELLOW (A) YELLOW   Appearance, Fluid CLEAR CLEAR   Total Nucleated Cell Count, Fluid 211 cu mm   Neutrophil  Count, Fluid 14 %   Lymphs, Fluid 66 %   Monocyte-Macrophage-Serous Fluid 20 %   Eos, Fluid 0 %   Other Cells, Fluid 0 %    Comment: Performed at [facility], [address], [place], [place] [address]  Body fluid culture     Status: None   Collection Time: 12/07/18  1:20 PM   Specimen: [facility] Cytology Peritoneal fluid  Result Value Ref Range   Specimen Description      PERITONEAL Performed at [facility], [address], [place], [place] [address]    Special Requests      NONE Performed at [facility], [address] [place], [place] [address]    Gram Stain      RARE WBC PRESENT,BOTH PMN AND MONONUCLEAR NO ORGANISMS SEEN    Culture      NO GROWTH 3 DAYS Performed at [facility], [place] [address] [place], [place] [address]    Report Status 12/11/2018 FINAL   Protein, pleural or peritoneal fluid     Status: None   Collection Time: 12/07/18  1:20 PM  Result Value Ref Range   Total protein, fluid 3.6 g/dL    Comment: (NOTE) No normal range established for this test Results should be evaluated in conjunction with serum values    Fluid Type-FTP PERITONEAL     Comment: Performed at [facility], [address] [place], [place] [address] CORRECTED ON 08/21 AT 1412: PREVIOUSLY REPORTED AS CYTOPERI   Glucose, pleural or peritoneal fluid     Status: None   Collection Time: 12/07/18  1:20 PM  Result Value Ref Range   Glucose, Fluid 113 mg/dL    Comment: (NOTE) No normal range established for this test Results should be evaluated in conjunction with serum values    Fluid Type-FGLU PERITONEAL     Comment: Performed at [facility], [address] [place], [place] [address] CORRECTED ON 08/21 AT 1412: PREVIOUSLY REPORTED AS CYTOPERI   PH, Body Fluid     Status: None   Collection Time: 12/07/18  1:20 PM  Result Value Ref Range   pH, Body Fluid 7.4 Not Estab.    Comment: (NOTE) This test was developed and its  performance characteristics determined by [facility]. It has not been cleared or approved by the Food and Drug Administration. The reference interval(s) and other method  performance specifications have not been established for this body fluid. The test result must be integrated into the clinical context for interpretation. Performed At: [facility] [place], [place] [id number] Rush Farmer MD AQ:[id number]    Source of Sample PERITONEAL     Comment: Performed at [facility], [place], [place] [address]  Lipase, Fluid     Status: None   Collection Time: 12/07/18  1:20 PM  Result Value Ref Range   Lipase-Fluid 14 U/L    Comment: (NOTE) INTERPRETIVE INFORMATION: Lipase, Fluid For information on body fluid reference ranges and/or interpretive guidance visit SuperbApps.be Test developed and characteristics determined by [facility]. See Compliance Statement B: [email or web address].fi Performed At: [facility] [address] [place], [place] [id number] Esau Grew MD [phone]    Source of Sample PERITONEAL     Comment: Performed at [facility], [address] [place], [place] [address]  Miscellaneous [facility] test (send-out)     Status: None   Collection Time: 12/07/18  1:20 PM  Result Value Ref Range   [facility] test code [id number]     Comment: CORRECTED ON 08/25 AT 1023: PREVIOUSLY REPORTED AS [id number]   [facility] test name ALBUMIN BODY FLUID SENT TO [facility]    Source [employer]) PARACENTESIS     Comment: Performed at [facility], [address] [place], [place] [address]   [place] result COMMENT     Comment: (NOTE) Performed At: [facility] [place], [place] [id number] Rush Farmer MD UG:[id number]   Glucose, capillary     Status: None   Collection Time: 12/07/18  2:26 PM  Result Value Ref Range   Glucose-Capillary 94 70 - 99 mg/dL   Comment 1 Notify  RN   Glucose, capillary     Status: Abnormal   Collection Time: 12/07/18  6:50 PM  Result Value Ref Range   Glucose-Capillary 218 (H) 70 - 99 mg/dL   Comment 1 Notify RN   Glucose, capillary     Status: Abnormal   Collection Time: 12/07/18 11:47 PM  Result Value Ref Range   Glucose-Capillary 154 (H) 70 - 99 mg/dL  Basic metabolic panel     Status: Abnormal   Collection Time: 12/08/18  4:26 AM  Result Value Ref Range   Sodium 138 135 - 145 mmol/L   Potassium 3.6 3.5 - 5.1 mmol/L   Chloride 99 98 - 111 mmol/L   CO2 28 22 - 32 mmol/L   Glucose, Bld 132 (H) 70 - 99 mg/dL   BUN 31 (H) 8 - 23 mg/dL   Creatinine, Ser 2.73 (H) 0.61 - 1.24 mg/dL   Calcium 8.3 (L) 8.9 - 10.3 mg/dL   GFR calc non Af Amer 22 (L) >60 mL/min   GFR calc Af Amer 25 (L) >60 mL/min   Anion gap 11 5 - 15    Comment: Performed at [facility], [address] [place], [place] [address]  Magnesium     Status: None   Collection Time: 12/08/18  4:26 AM  Result Value Ref Range   Magnesium 1.9 1.7 - 2.4 mg/dL    Comment: Performed at [facility], [address] [place], [place] [address]  Glucose, capillary     Status: Abnormal   Collection Time: 12/08/18  7:45 AM  Result Value Ref Range   Glucose-Capillary 136 (H) 70 - 99 mg/dL   Comment 1 Notify RN  Radiology Ct Abdomen Pelvis Wo Contrast  Result Date: 12/06/2018 CLINICAL DATA:  Abdominal distension and pain, ongoing chest tightness EXAM: CT ABDOMEN AND PELVIS WITHOUT CONTRAST TECHNIQUE: Multidetector CT imaging of the abdomen and pelvis was performed following the standard protocol without IV contrast. COMPARISON:  CT 03/09/2018 FINDINGS: Lower chest: Small right pleural effusion with adjacent areas of passive atelectasis. Cardiomegaly. Coronary calcifications. Hepatobiliary: No focal hepatic lesions. Some mild lobulation to the liver surface contour. No calcified gallstones. No biliary ductal dilatation. Mild gallbladder wall thickening,  nonspecific in the setting of ascites. Pancreas: Unremarkable. No pancreatic ductal dilatation or surrounding inflammatory changes. Spleen: Normal in size without focal abnormality. Adrenals/Urinary Tract: Normal adrenal glands. Mild bilateral nonspecific perinephric stranding, a nonspecific finding though may correlate with either age or decreased renal function. Few punctate nonobstructing calculi in both kidneys. No concerning renal lesions. No hydronephrosis or obstructive urolithiasis. Urinary bladder is circumferentially thickened, greater than expected for the degree of underdistention. Stomach/Bowel: Distal esophagus, stomach and duodenal sweep are unremarkable. No bowel wall thickening or dilatation. No evidence of obstruction. Scattered colonic diverticula without focal pericolonic inflammation to suggest diverticulitis. Vascular/Lymphatic: Atherosclerotic plaque within the normal caliber aorta. Right proximal superficial femoral artery stent graft is noted. No suspicious or enlarged lymph nodes in the included lymphatic chains. Reproductive: Coarse eccentric calcification of the prostate. No concerning abnormalities of the prostate or seminal vesicles. Other: Moderate volume ascites, increased from comparison studies. Multiple fat containing ventral hernias in the periumbilical and supraumbilical regions, with stable fascial defects but now containing some ascitic fluid. Circumferential body wall edema is noted. Additional soft tissue edema tracks inferiorly to the external genitalia Musculoskeletal: No acute osseous abnormality or suspicious osseous lesion. IMPRESSION: 1. Moderate volume ascites, increased from comparison studies. Fluid sterility not ascertained on imaging. 2. Questionable nodularity of hepatic surface, correlate for intrinsic liver disease. 3. Circumferentially thickened urinary bladder wall, greater than expected for the degree of underdistention. Correlate with urinalysis to exclude  cystitis. 4. Multiple fat containing ventral hernias in the periumbilical and supraumbilical regions, with stable fascial defects but now containing some ascitic fluid. 5. Small right pleural effusion with adjacent areas of passive atelectasis. 6. Aortic Atherosclerosis (ICD10-I70.0). Electronically Signed   By: Lovena Le M.D.   On: 12/06/2018 19:45   Dg Chest 2 View  Result Date: 12/06/2018 CLINICAL DATA:  Chest tightness for several weeks. EXAM: CHEST - 2 VIEW COMPARISON:  March 17, 2018 FINDINGS: Mediastinal contour is normal. Heart size is enlarged. Patient status post prior CABG and median sternotomy. Increased pulmonary interstitium is identified bilaterally. There is no focal pneumonia. Minimal right pleural effusion is identified. Bony structures are stable. IMPRESSION: Mild interstitial edema.  Minimal right pleural effusion. Electronically Signed   By: Abelardo Diesel M.D.   On: 12/06/2018 17:24   US Abdomen Limited  Result Date: 12/04/2018 CLINICAL DATA:  Ventral hernia, diabetes mellitus, hypertension, coronary artery disease post MI, melanoma, CHF, emphysema, former smoker EXAM: ULTRASOUND ABDOMEN LIMITED COMPARISON:  CT abdomen and pelvis 03/09/2018 FINDINGS: Sonography of the anterior abdominal wall was performed. Two ventral hernias are seen. RIGHT supraumbilical ventral hernia with neck measuring approximately 1.5 x 2.3 cm in size. This hernia contains fat and a small amount of mildly complicated fluid. Second midline supraumbilical hernia with a neck measuring 2.9 x 2.3 cm in size. This hernia to contains fat and a small amount of complicated fluid. No definite bowel herniation is seen at either hernia. Review of the patient's prior CT exam from  2019 demonstrates presence of at least 4 ventral hernias which are supraumbilical to umbilical in position. IMPRESSION: Two ventral hernias as described above containing fat and mildly complicated fluid. A prior CT exam however demonstrates  presence of at least 4 ventral hernias. Electronically Signed   By: Lavonia Dana M.D.   On: 12/04/2018 11:34   US Paracentesis  Result Date: 12/07/2018 INDICATION: 73 year old with ascites. EXAM: ULTRASOUND GUIDED PARACENTESIS MEDICATIONS: None. COMPLICATIONS: None immediate. PROCEDURE: Informed written consent was obtained from the patient after a discussion of the risks, benefits and alternatives to treatment. A timeout was performed prior to the initiation of the procedure. Initial ultrasound scanning demonstrates a large amount of ascites within the right lower abdominal quadrant. The right lower abdomen was prepped and draped in the usual sterile fashion. 1% lidocaine was used for local anesthesia. Following this, a 6 Fr Safe-T-Centesis catheter was introduced. An ultrasound image was saved for documentation purposes. The paracentesis was performed. The catheter was removed and a dressing was applied. The patient tolerated the procedure well without immediate post procedural complication. FINDINGS: A total of approximately 3.2 L of yellow fluid was removed. Samples were sent to the laboratory as requested by the clinical team. IMPRESSION: Successful ultrasound-guided paracentesis yielding 3.2 liters of peritoneal fluid. Electronically Signed   By: Markus Daft M.D.   On: 12/07/2018 14:03    Assessment/Plan Essential (primary) hypertension blood pressure control important in reducing the progression of atherosclerotic disease. On appropriate oral medications.   PAD (peripheral artery disease) (HCC) Noninvasive studies today show stable ABIs of 0.81 on the left and 0.87 on the right with digit pressures in the 70s bilaterally.  His perfusion is mildly reduced but currently has no limb threatening symptoms and his legs are doing quite well.  Recheck in 6 months.  DM (diabetes mellitus), type 2 with renal complications (HCC) blood glucose control important in reducing the progression of  atherosclerotic disease. Also, involved in wound healing. On appropriate medications.  End stage renal disease on dialysis [facility]) Duplex today shows a widely patent left brachiocephalic AV fistula.  This has had a couple of infiltrations and issues since its use, most of these have been related to the techs taking the fistula in a different fashion.  At this point, a 42-monthfollow-up can be done and then if this looks okay we may check it either annually or as needed    JLeotis Pain MD  12/25/2018 11:15 AM    This note was created with Dragon medical transcription system.  Any errors from dictation are purely unintentional

## 2018-12-25 NOTE — Assessment & Plan Note (Signed)
Duplex today shows a widely patent left brachiocephalic AV fistula.  This has had a couple of infiltrations and issues since its use, most of these have been related to the techs taking the fistula in a different fashion.  At this point, a 60-month follow-up can be done and then if this looks okay we may check it either annually or as needed

## 2018-12-26 ENCOUNTER — Ambulatory Visit: Payer: Self-pay | Admitting: Surgery

## 2018-12-26 DIAGNOSIS — Z992 Dependence on renal dialysis: Secondary | ICD-10-CM | POA: Diagnosis not present

## 2018-12-26 DIAGNOSIS — N186 End stage renal disease: Secondary | ICD-10-CM | POA: Diagnosis not present

## 2018-12-27 LAB — MISC LABCORP TEST (SEND OUT): Labcorp test code: 50024

## 2018-12-28 DIAGNOSIS — N186 End stage renal disease: Secondary | ICD-10-CM | POA: Diagnosis not present

## 2018-12-28 DIAGNOSIS — Z992 Dependence on renal dialysis: Secondary | ICD-10-CM | POA: Diagnosis not present

## 2018-12-31 DIAGNOSIS — N2581 Secondary hyperparathyroidism of renal origin: Secondary | ICD-10-CM | POA: Diagnosis not present

## 2018-12-31 DIAGNOSIS — Z992 Dependence on renal dialysis: Secondary | ICD-10-CM | POA: Diagnosis not present

## 2018-12-31 DIAGNOSIS — N186 End stage renal disease: Secondary | ICD-10-CM | POA: Diagnosis not present

## 2019-01-02 DIAGNOSIS — Z992 Dependence on renal dialysis: Secondary | ICD-10-CM | POA: Diagnosis not present

## 2019-01-02 DIAGNOSIS — N186 End stage renal disease: Secondary | ICD-10-CM | POA: Diagnosis not present

## 2019-01-04 DIAGNOSIS — N186 End stage renal disease: Secondary | ICD-10-CM | POA: Diagnosis not present

## 2019-01-04 DIAGNOSIS — Z992 Dependence on renal dialysis: Secondary | ICD-10-CM | POA: Diagnosis not present

## 2019-01-07 DIAGNOSIS — Z992 Dependence on renal dialysis: Secondary | ICD-10-CM | POA: Diagnosis not present

## 2019-01-07 DIAGNOSIS — N186 End stage renal disease: Secondary | ICD-10-CM | POA: Diagnosis not present

## 2019-01-08 ENCOUNTER — Other Ambulatory Visit: Payer: Self-pay | Admitting: Family Medicine

## 2019-01-09 DIAGNOSIS — Z992 Dependence on renal dialysis: Secondary | ICD-10-CM | POA: Diagnosis not present

## 2019-01-09 DIAGNOSIS — N186 End stage renal disease: Secondary | ICD-10-CM | POA: Diagnosis not present

## 2019-01-10 DIAGNOSIS — N186 End stage renal disease: Secondary | ICD-10-CM | POA: Diagnosis not present

## 2019-01-10 DIAGNOSIS — Z992 Dependence on renal dialysis: Secondary | ICD-10-CM | POA: Diagnosis not present

## 2019-01-14 DIAGNOSIS — Z992 Dependence on renal dialysis: Secondary | ICD-10-CM | POA: Diagnosis not present

## 2019-01-14 DIAGNOSIS — N186 End stage renal disease: Secondary | ICD-10-CM | POA: Diagnosis not present

## 2019-01-16 DIAGNOSIS — Z992 Dependence on renal dialysis: Secondary | ICD-10-CM | POA: Diagnosis not present

## 2019-01-16 DIAGNOSIS — N186 End stage renal disease: Secondary | ICD-10-CM | POA: Diagnosis not present

## 2019-01-17 DIAGNOSIS — T82898A Other specified complication of vascular prosthetic devices, implants and grafts, initial encounter: Secondary | ICD-10-CM | POA: Diagnosis not present

## 2019-01-17 DIAGNOSIS — N186 End stage renal disease: Secondary | ICD-10-CM | POA: Diagnosis not present

## 2019-01-17 DIAGNOSIS — Z992 Dependence on renal dialysis: Secondary | ICD-10-CM | POA: Diagnosis not present

## 2019-01-18 ENCOUNTER — Telehealth: Payer: Self-pay

## 2019-01-18 DIAGNOSIS — Z992 Dependence on renal dialysis: Secondary | ICD-10-CM | POA: Diagnosis not present

## 2019-01-18 DIAGNOSIS — T82898A Other specified complication of vascular prosthetic devices, implants and grafts, initial encounter: Secondary | ICD-10-CM | POA: Diagnosis not present

## 2019-01-18 DIAGNOSIS — N186 End stage renal disease: Secondary | ICD-10-CM | POA: Diagnosis not present

## 2019-01-18 NOTE — Telephone Encounter (Signed)
Spoke with patient in regards to his follow up appointment with Dr.Pabon and he states he was asked to hold off on any surgeries at this time due to his condition per Dr.Skulski.

## 2019-01-21 ENCOUNTER — Ambulatory Visit
Admission: RE | Admit: 2019-01-21 | Discharge: 2019-01-21 | Disposition: A | Discharge status: Home / Self Care | Payer: Medicare Other | Source: Ambulatory Visit | Attending: Gastroenterology | Admitting: Gastroenterology

## 2019-01-21 ENCOUNTER — Other Ambulatory Visit: Payer: Self-pay | Admitting: Gastroenterology

## 2019-01-21 ENCOUNTER — Other Ambulatory Visit: Payer: Self-pay

## 2019-01-21 ENCOUNTER — Other Ambulatory Visit: Payer: Self-pay | Admitting: Family Medicine

## 2019-01-21 DIAGNOSIS — N186 End stage renal disease: Secondary | ICD-10-CM | POA: Diagnosis not present

## 2019-01-21 DIAGNOSIS — T82898A Other specified complication of vascular prosthetic devices, implants and grafts, initial encounter: Secondary | ICD-10-CM | POA: Diagnosis not present

## 2019-01-21 DIAGNOSIS — K7460 Unspecified cirrhosis of liver: Secondary | ICD-10-CM | POA: Diagnosis not present

## 2019-01-21 DIAGNOSIS — Z992 Dependence on renal dialysis: Secondary | ICD-10-CM | POA: Diagnosis not present

## 2019-01-21 DIAGNOSIS — R188 Other ascites: Secondary | ICD-10-CM | POA: Insufficient documentation

## 2019-01-22 DIAGNOSIS — K7460 Unspecified cirrhosis of liver: Secondary | ICD-10-CM | POA: Diagnosis not present

## 2019-01-22 DIAGNOSIS — R188 Other ascites: Secondary | ICD-10-CM | POA: Diagnosis not present

## 2019-01-23 ENCOUNTER — Other Ambulatory Visit: Payer: Self-pay | Admitting: Gastroenterology

## 2019-01-23 ENCOUNTER — Other Ambulatory Visit: Payer: Self-pay | Admitting: Internal Medicine

## 2019-01-23 DIAGNOSIS — T82898A Other specified complication of vascular prosthetic devices, implants and grafts, initial encounter: Secondary | ICD-10-CM | POA: Diagnosis not present

## 2019-01-23 DIAGNOSIS — Z992 Dependence on renal dialysis: Secondary | ICD-10-CM | POA: Diagnosis not present

## 2019-01-23 DIAGNOSIS — N186 End stage renal disease: Secondary | ICD-10-CM | POA: Diagnosis not present

## 2019-01-23 DIAGNOSIS — R188 Other ascites: Secondary | ICD-10-CM

## 2019-01-24 ENCOUNTER — Other Ambulatory Visit: Payer: Self-pay

## 2019-01-24 ENCOUNTER — Ambulatory Visit
Admission: RE | Admit: 2019-01-24 | Discharge: 2019-01-24 | Disposition: A | Discharge status: Home / Self Care | Payer: Medicare Other | Source: Ambulatory Visit | Attending: Gastroenterology | Admitting: Gastroenterology

## 2019-01-24 DIAGNOSIS — R188 Other ascites: Secondary | ICD-10-CM | POA: Diagnosis not present

## 2019-01-24 DIAGNOSIS — K7460 Unspecified cirrhosis of liver: Secondary | ICD-10-CM | POA: Diagnosis not present

## 2019-01-24 LAB — ALBUMIN, PLEURAL OR PERITONEAL FLUID: Albumin, Fluid: 1.8 g/dL

## 2019-01-24 LAB — PROTEIN, PLEURAL OR PERITONEAL FLUID: Total protein, fluid: 4 g/dL

## 2019-01-24 NOTE — Procedures (Signed)
US paracentesis without difficulty  Complications:  None  Blood Loss: none  See dictation in canopy pacs  

## 2019-01-25 DIAGNOSIS — Z992 Dependence on renal dialysis: Secondary | ICD-10-CM | POA: Diagnosis not present

## 2019-01-25 DIAGNOSIS — T82898A Other specified complication of vascular prosthetic devices, implants and grafts, initial encounter: Secondary | ICD-10-CM | POA: Diagnosis not present

## 2019-01-25 DIAGNOSIS — N186 End stage renal disease: Secondary | ICD-10-CM | POA: Diagnosis not present

## 2019-01-25 LAB — CYTOLOGY - NON PAP

## 2019-01-27 LAB — BODY FLUID CULTURE: Culture: NO GROWTH

## 2019-01-28 DIAGNOSIS — Z992 Dependence on renal dialysis: Secondary | ICD-10-CM | POA: Diagnosis not present

## 2019-01-28 DIAGNOSIS — T82898A Other specified complication of vascular prosthetic devices, implants and grafts, initial encounter: Secondary | ICD-10-CM | POA: Diagnosis not present

## 2019-01-28 DIAGNOSIS — N2581 Secondary hyperparathyroidism of renal origin: Secondary | ICD-10-CM | POA: Diagnosis not present

## 2019-01-28 DIAGNOSIS — N186 End stage renal disease: Secondary | ICD-10-CM | POA: Diagnosis not present

## 2019-01-29 DIAGNOSIS — R188 Other ascites: Secondary | ICD-10-CM | POA: Diagnosis not present

## 2019-01-29 DIAGNOSIS — K7460 Unspecified cirrhosis of liver: Secondary | ICD-10-CM | POA: Diagnosis not present

## 2019-01-30 ENCOUNTER — Other Ambulatory Visit: Payer: Self-pay | Admitting: Family Medicine

## 2019-01-30 DIAGNOSIS — N186 End stage renal disease: Secondary | ICD-10-CM | POA: Diagnosis not present

## 2019-01-30 DIAGNOSIS — Z992 Dependence on renal dialysis: Secondary | ICD-10-CM | POA: Diagnosis not present

## 2019-01-30 DIAGNOSIS — T82898A Other specified complication of vascular prosthetic devices, implants and grafts, initial encounter: Secondary | ICD-10-CM | POA: Diagnosis not present

## 2019-01-30 NOTE — Telephone Encounter (Signed)
Please review. KW 

## 2019-02-01 DIAGNOSIS — N186 End stage renal disease: Secondary | ICD-10-CM | POA: Diagnosis not present

## 2019-02-01 DIAGNOSIS — Z992 Dependence on renal dialysis: Secondary | ICD-10-CM | POA: Diagnosis not present

## 2019-02-01 DIAGNOSIS — T82898A Other specified complication of vascular prosthetic devices, implants and grafts, initial encounter: Secondary | ICD-10-CM | POA: Diagnosis not present

## 2019-02-04 DIAGNOSIS — Z992 Dependence on renal dialysis: Secondary | ICD-10-CM | POA: Diagnosis not present

## 2019-02-04 DIAGNOSIS — T82898A Other specified complication of vascular prosthetic devices, implants and grafts, initial encounter: Secondary | ICD-10-CM | POA: Diagnosis not present

## 2019-02-04 DIAGNOSIS — N186 End stage renal disease: Secondary | ICD-10-CM | POA: Diagnosis not present

## 2019-02-05 DIAGNOSIS — I5023 Acute on chronic systolic (congestive) heart failure: Secondary | ICD-10-CM | POA: Diagnosis not present

## 2019-02-06 DIAGNOSIS — T82898A Other specified complication of vascular prosthetic devices, implants and grafts, initial encounter: Secondary | ICD-10-CM | POA: Diagnosis not present

## 2019-02-06 DIAGNOSIS — Z992 Dependence on renal dialysis: Secondary | ICD-10-CM | POA: Diagnosis not present

## 2019-02-06 DIAGNOSIS — N186 End stage renal disease: Secondary | ICD-10-CM | POA: Diagnosis not present

## 2019-02-07 DIAGNOSIS — I10 Essential (primary) hypertension: Secondary | ICD-10-CM | POA: Diagnosis not present

## 2019-02-07 DIAGNOSIS — I214 Non-ST elevation (NSTEMI) myocardial infarction: Secondary | ICD-10-CM | POA: Diagnosis not present

## 2019-02-07 DIAGNOSIS — I25810 Atherosclerosis of coronary artery bypass graft(s) without angina pectoris: Secondary | ICD-10-CM | POA: Diagnosis not present

## 2019-02-07 DIAGNOSIS — I5023 Acute on chronic systolic (congestive) heart failure: Secondary | ICD-10-CM | POA: Diagnosis not present

## 2019-02-07 DIAGNOSIS — I739 Peripheral vascular disease, unspecified: Secondary | ICD-10-CM | POA: Diagnosis not present

## 2019-02-08 DIAGNOSIS — Z992 Dependence on renal dialysis: Secondary | ICD-10-CM | POA: Diagnosis not present

## 2019-02-08 DIAGNOSIS — T82898A Other specified complication of vascular prosthetic devices, implants and grafts, initial encounter: Secondary | ICD-10-CM | POA: Diagnosis not present

## 2019-02-08 DIAGNOSIS — N186 End stage renal disease: Secondary | ICD-10-CM | POA: Diagnosis not present

## 2019-02-11 DIAGNOSIS — N186 End stage renal disease: Secondary | ICD-10-CM | POA: Diagnosis not present

## 2019-02-11 DIAGNOSIS — T82898A Other specified complication of vascular prosthetic devices, implants and grafts, initial encounter: Secondary | ICD-10-CM | POA: Diagnosis not present

## 2019-02-11 DIAGNOSIS — E119 Type 2 diabetes mellitus without complications: Secondary | ICD-10-CM | POA: Diagnosis not present

## 2019-02-11 DIAGNOSIS — Z794 Long term (current) use of insulin: Secondary | ICD-10-CM | POA: Diagnosis not present

## 2019-02-11 DIAGNOSIS — Z992 Dependence on renal dialysis: Secondary | ICD-10-CM | POA: Diagnosis not present

## 2019-02-11 DIAGNOSIS — D509 Iron deficiency anemia, unspecified: Secondary | ICD-10-CM | POA: Diagnosis not present

## 2019-02-13 DIAGNOSIS — Z992 Dependence on renal dialysis: Secondary | ICD-10-CM | POA: Diagnosis not present

## 2019-02-13 DIAGNOSIS — N186 End stage renal disease: Secondary | ICD-10-CM | POA: Diagnosis not present

## 2019-02-13 DIAGNOSIS — T82898A Other specified complication of vascular prosthetic devices, implants and grafts, initial encounter: Secondary | ICD-10-CM | POA: Diagnosis not present

## 2019-02-15 DIAGNOSIS — Z992 Dependence on renal dialysis: Secondary | ICD-10-CM | POA: Diagnosis not present

## 2019-02-15 DIAGNOSIS — N186 End stage renal disease: Secondary | ICD-10-CM | POA: Diagnosis not present

## 2019-02-15 DIAGNOSIS — T82898A Other specified complication of vascular prosthetic devices, implants and grafts, initial encounter: Secondary | ICD-10-CM | POA: Diagnosis not present

## 2019-02-16 DIAGNOSIS — N186 End stage renal disease: Secondary | ICD-10-CM | POA: Diagnosis not present

## 2019-02-16 DIAGNOSIS — Z992 Dependence on renal dialysis: Secondary | ICD-10-CM | POA: Diagnosis not present

## 2019-02-18 DIAGNOSIS — Z992 Dependence on renal dialysis: Secondary | ICD-10-CM | POA: Diagnosis not present

## 2019-02-18 DIAGNOSIS — N186 End stage renal disease: Secondary | ICD-10-CM | POA: Diagnosis not present

## 2019-02-20 DIAGNOSIS — N186 End stage renal disease: Secondary | ICD-10-CM | POA: Diagnosis not present

## 2019-02-20 DIAGNOSIS — Z992 Dependence on renal dialysis: Secondary | ICD-10-CM | POA: Diagnosis not present

## 2019-02-21 ENCOUNTER — Other Ambulatory Visit: Payer: Self-pay | Admitting: Family Medicine

## 2019-02-22 DIAGNOSIS — N186 End stage renal disease: Secondary | ICD-10-CM | POA: Diagnosis not present

## 2019-02-22 DIAGNOSIS — Z992 Dependence on renal dialysis: Secondary | ICD-10-CM | POA: Diagnosis not present

## 2019-02-23 ENCOUNTER — Other Ambulatory Visit (INDEPENDENT_AMBULATORY_CARE_PROVIDER_SITE_OTHER): Payer: Self-pay | Admitting: Vascular Surgery

## 2019-02-23 ENCOUNTER — Other Ambulatory Visit: Payer: Self-pay | Admitting: Family Medicine

## 2019-02-23 DIAGNOSIS — F410 Panic disorder [episodic paroxysmal anxiety] without agoraphobia: Secondary | ICD-10-CM

## 2019-02-25 DIAGNOSIS — Z992 Dependence on renal dialysis: Secondary | ICD-10-CM | POA: Diagnosis not present

## 2019-02-25 DIAGNOSIS — N186 End stage renal disease: Secondary | ICD-10-CM | POA: Diagnosis not present

## 2019-02-25 DIAGNOSIS — N2581 Secondary hyperparathyroidism of renal origin: Secondary | ICD-10-CM | POA: Diagnosis not present

## 2019-02-26 ENCOUNTER — Encounter: Payer: Self-pay | Admitting: Family Medicine

## 2019-02-26 ENCOUNTER — Other Ambulatory Visit: Payer: Self-pay

## 2019-02-26 ENCOUNTER — Ambulatory Visit (INDEPENDENT_AMBULATORY_CARE_PROVIDER_SITE_OTHER): Payer: Medicare Other | Admitting: Family Medicine

## 2019-02-26 VITALS — BP 128/62 | HR 61 | Temp 97.1°F | Resp 18 | Wt 162.0 lb

## 2019-02-26 DIAGNOSIS — I5022 Chronic systolic (congestive) heart failure: Secondary | ICD-10-CM

## 2019-02-26 DIAGNOSIS — E113292 Type 2 diabetes mellitus with mild nonproliferative diabetic retinopathy without macular edema, left eye: Secondary | ICD-10-CM | POA: Diagnosis not present

## 2019-02-26 DIAGNOSIS — Z794 Long term (current) use of insulin: Secondary | ICD-10-CM | POA: Diagnosis not present

## 2019-02-26 DIAGNOSIS — E1129 Type 2 diabetes mellitus with other diabetic kidney complication: Secondary | ICD-10-CM | POA: Insufficient documentation

## 2019-02-26 DIAGNOSIS — Z992 Dependence on renal dialysis: Secondary | ICD-10-CM

## 2019-02-26 DIAGNOSIS — N186 End stage renal disease: Secondary | ICD-10-CM

## 2019-02-26 DIAGNOSIS — H9193 Unspecified hearing loss, bilateral: Secondary | ICD-10-CM | POA: Diagnosis not present

## 2019-02-26 DIAGNOSIS — E1122 Type 2 diabetes mellitus with diabetic chronic kidney disease: Secondary | ICD-10-CM

## 2019-02-26 LAB — POCT GLYCOSYLATED HEMOGLOBIN (HGB A1C)
Est. average glucose Bld gHb Est-mCnc: 171
Hemoglobin A1C: 7.6 % — AB (ref 4.0–5.6)

## 2019-02-26 NOTE — Progress Notes (Signed)
Patient: Thomas [name] Male    DOB: 10/05/1945   73 y.o.   MRN: [id number] Visit Date: 02/26/2019  Today's Provider: Lelon Huh, MD   Chief Complaint  Patient presents with  . Diabetes   Subjective:     HPI  Diabetes Mellitus Type II, Follow-up:   Lab Results  Component Value Date   HGBA1C 7.9 (H) 09/27/2018   HGBA1C 7.3 (A) 04/26/2018   HGBA1C 9.3 (A) 01/24/2018    Last seen for diabetes 4 months ago.  Management since then includes increasing Tresiba by 2 units. He reports good compliance with treatment. He is not having side effects.  Current symptoms include none and have been stable. Home blood sugar records: fasting range: 110-118  Episodes of hypoglycemia? no   Current insulin regiment: Tresiba 6 units a day.  Most Recent Eye Exam: UTD Weight trend: increasing steadily Prior visit with dietician: No Current exercise: walking Current diet habits: well balanced  Pertinent Labs:    Component Value Date/Time   CHOL 102 09/27/2018 0955   TRIG 100 09/27/2018 0955   HDL 35 (L) 09/27/2018 0955   LDLCALC 47 09/27/2018 0955   CREATININE 2.73 (H) 12/08/2018 0426   CREATININE 1.13 04/03/2014 1535    Wt Readings from Last 3 Encounters:  02/26/19 162 lb (73.5 kg)  12/25/18 154 lb (69.9 kg)  12/18/18 162 lb (73.5 kg)    ------------------------------------------------------------------------  His wife also reports he has a lot of trouble hearing.   He continues on hemodialysis managed by Dr. Candiss Norse.    Allergies  Allergen Reactions  . Ciprofloxacin Itching and Swelling    Facial swelling   . Hydralazine Nausea And Vomiting  . Trulicity [Dulaglutide] Other (See Comments)    Bloating     Current Outpatient Medications:  .  acetaminophen (TYLENOL) 500 MG tablet, Take 500 mg by mouth every 8 (eight) hours as needed for mild pain or headache., Disp: , Rfl:  .  ALPRAZolam (XANAX) 0.25 MG tablet, TAKE 1 OR 2 TABLETS BY MOUTH ONCE DAILY AS  NEEDED ANXIETY, Disp: 60 tablet, Rfl: 2 .  atorvastatin (LIPITOR) 80 MG tablet, TAKE 1 TABLET BY MOUTH ONCE DAILY, Disp: 30 tablet, Rfl: 5 .  B Complex-C-Folic Acid (RENA-VITE PO), Take by mouth daily., Disp: , Rfl:  .  Blood Glucose Monitoring Suppl (ONE TOUCH ULTRA 2) w/Device KIT, Use to check gluocose daily for type 2 diabetes, Disp: 1 each, Rfl: 0 .  calcium acetate (PHOSLO) 667 MG capsule, TAKE ONE CAPSULE BY MOUTH THREE TIMES DAILY WITH MEALS, Disp: 276 capsule, Rfl: 4 .  clopidogrel (PLAVIX) 75 MG tablet, TAKE 1 TABLET BY MOUTH ONCE DAILY, Disp: 30 tablet, Rfl: 2 .  glucose blood test strip, Contour [employer] test strips and lancets Use to check blood sugar daily for type 2 diabetes, E11.9., Disp: 100 each, Rfl: 4 .  insulin degludec (TRESIBA FLEXTOUCH) 100 UNIT/ML SOPN FlexTouch Pen, Inject 0.06 mLs (6 Units total) into the skin daily. Increase by 2 units a day if fasting glucose is over 200, Disp: 3 mL, Rfl: 0 .  isosorbide mononitrate (IMDUR) 60 MG 24 hr tablet, TAKE 1 TABLET BY MOUTH ONCE DAILY, Disp: 30 tablet, Rfl: 5 .  LORazepam (ATIVAN) 0.5 MG tablet, Take 1 tablet (0.5 mg total) by mouth at bedtime as needed for sleep., Disp: 30 tablet, Rfl: 3 .  losartan (COZAAR) 25 MG tablet, TAKE 1 TABLET BY MOUTH ONCE EVERY EVENING, Disp: 30 tablet, Rfl:  12 .  magnesium oxide (MAG-OX) 400 MG tablet, Take 400 mg by mouth daily., Disp: , Rfl:  .  metoprolol succinate (TOPROL-XL) 50 MG 24 hr tablet, TAKE 1 TABLET BY MOUTH ONCE DAILY, Disp: 30 tablet, Rfl: 12 .  pantoprazole (PROTONIX) 40 MG tablet, Take 1 tablet (40 mg total) by mouth daily., Disp: 30 tablet, Rfl: 12 .  polycarbophil (FIBERCON) 625 MG tablet, Take 625 mg by mouth as needed., Disp: , Rfl:  .  sodium bicarbonate 650 MG tablet, Take 650 mg by mouth 2 (two) times daily., Disp: , Rfl:  .  spironolactone (ALDACTONE) 25 MG tablet, Take 1 tablet by mouth 3 (three) times a week., Disp: , Rfl:  .  tamsulosin (FLOMAX) 0.4 MG CAPS capsule, TAKE  1 CAPSULE BY MOUTH ONCE DAILY 30 MINUTES AFTER LARGEST MEAL., Disp: 30 capsule, Rfl: 5 .  torsemide (DEMADEX) 100 MG tablet, Take 100 mg by mouth daily. Do not take on dialysis days, Disp: , Rfl:  .  traMADol (ULTRAM) 50 MG tablet, TAKE 1 TABLET BY MOUTH EVERY 6 HOURS AS NEEDED FOR PAIN, Disp: 28 tablet, Rfl: 5  Review of Systems  Constitutional: Negative for appetite change, chills and fever.  Respiratory: Negative for chest tightness, shortness of breath and wheezing.   Cardiovascular: Negative for chest pain and palpitations.  Gastrointestinal: Negative for abdominal pain, nausea and vomiting.  Musculoskeletal:       Pain under the bottom of his feet    Social History   Tobacco Use  . Smoking status: Former Smoker    Packs/day: 1.00    Years: 51.00    Pack years: 51.00    Types: Cigarettes    Quit date: 06/21/2017    Years since quitting: 1.6  . Smokeless tobacco: Never Used  . Tobacco comment: started smoking at age 62-25. Smoked for 50 years  Substance Use Topics  . Alcohol use: Not Currently    Alcohol/week: 0.0 standard drinks    Frequency: Never      Objective:   BP 128/62 (BP Location: Left Arm, Patient Position: Sitting, Cuff Size: Normal)   Pulse 61   Temp (!) 97.1 F (36.2 C) (Temporal)   Resp 18   Wt 162 lb (73.5 kg)   SpO2 99% Comment: room air  BMI 26.15 kg/m  Vitals:   02/26/19 0819  BP: 128/62  Pulse: 61  Resp: 18  Temp: (!) 97.1 F (36.2 C)  TempSrc: Temporal  SpO2: 99%  Weight: 162 lb (73.5 kg)  Body mass index is 26.15 kg/m.   Physical Exam   General Appearance:    Well developed, well nourished male in no acute distress  Eyes:    PERRL, conjunctiva/corneas clear, EOM's intact       HEENT:   Moderate cerumen in each ear canal, but not obstructed.   Lungs:     Clear to auscultation bilaterally, respirations unlabored  Heart:    Normal heart rate. Normal rhythm. No murmurs, rubs, or gallops.   MS:   All extremities are intact.    Neurologic:   Awake, alert, oriented x 3. No apparent focal neurological           defect.        Results for orders placed or performed in visit on 02/26/19  POCT HgB A1C  Result Value Ref Range   Hemoglobin A1C 7.6 (A) 4.0 - 5.6 %   Est. average glucose Bld gHb Est-mCnc 171       Assessment &  Plan    1. 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 Chronic systolic CHF (congestive heart failure) ([facility]) Asymptomatic. Compliant with medication.  Continue aggressive risk factor modification.    4. Bilateral hearing loss, unspecified hearing loss type  - Ambulatory referral to Audiology  5. 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      Lelon Huh, MD  [facility]

## 2019-02-26 NOTE — Patient Instructions (Signed)
.   Please review the attached list of medications and notify my office if there are any errors.   . Please bring all of your medications to every appointment so we can make sure that our medication list is the same as yours.   . It is especially important to get the annual flu vaccine this year. If you haven't had it already, please go to your pharmacy or call the office as soon as possible to schedule you flu shot.  

## 2019-02-27 DIAGNOSIS — Z992 Dependence on renal dialysis: Secondary | ICD-10-CM | POA: Diagnosis not present

## 2019-02-27 DIAGNOSIS — N186 End stage renal disease: Secondary | ICD-10-CM | POA: Diagnosis not present

## 2019-03-01 DIAGNOSIS — N186 End stage renal disease: Secondary | ICD-10-CM | POA: Diagnosis not present

## 2019-03-01 DIAGNOSIS — Z992 Dependence on renal dialysis: Secondary | ICD-10-CM | POA: Diagnosis not present

## 2019-03-04 DIAGNOSIS — Z992 Dependence on renal dialysis: Secondary | ICD-10-CM | POA: Diagnosis not present

## 2019-03-04 DIAGNOSIS — N186 End stage renal disease: Secondary | ICD-10-CM | POA: Diagnosis not present

## 2019-03-06 DIAGNOSIS — Z992 Dependence on renal dialysis: Secondary | ICD-10-CM | POA: Diagnosis not present

## 2019-03-06 DIAGNOSIS — N186 End stage renal disease: Secondary | ICD-10-CM | POA: Diagnosis not present

## 2019-03-08 DIAGNOSIS — N186 End stage renal disease: Secondary | ICD-10-CM | POA: Diagnosis not present

## 2019-03-08 DIAGNOSIS — Z992 Dependence on renal dialysis: Secondary | ICD-10-CM | POA: Diagnosis not present

## 2019-03-11 DIAGNOSIS — Z992 Dependence on renal dialysis: Secondary | ICD-10-CM | POA: Diagnosis not present

## 2019-03-11 DIAGNOSIS — N186 End stage renal disease: Secondary | ICD-10-CM | POA: Diagnosis not present

## 2019-03-13 DIAGNOSIS — Z992 Dependence on renal dialysis: Secondary | ICD-10-CM | POA: Diagnosis not present

## 2019-03-13 DIAGNOSIS — N186 End stage renal disease: Secondary | ICD-10-CM | POA: Diagnosis not present

## 2019-03-15 DIAGNOSIS — Z992 Dependence on renal dialysis: Secondary | ICD-10-CM | POA: Diagnosis not present

## 2019-03-15 DIAGNOSIS — N186 End stage renal disease: Secondary | ICD-10-CM | POA: Diagnosis not present

## 2019-03-18 DIAGNOSIS — Z992 Dependence on renal dialysis: Secondary | ICD-10-CM | POA: Diagnosis not present

## 2019-03-18 DIAGNOSIS — N186 End stage renal disease: Secondary | ICD-10-CM | POA: Diagnosis not present

## 2019-03-20 ENCOUNTER — Telehealth: Payer: Self-pay | Admitting: Family Medicine

## 2019-03-20 DIAGNOSIS — N186 End stage renal disease: Secondary | ICD-10-CM | POA: Diagnosis not present

## 2019-03-20 DIAGNOSIS — Z992 Dependence on renal dialysis: Secondary | ICD-10-CM | POA: Diagnosis not present

## 2019-03-20 NOTE — Telephone Encounter (Addendum)
Celeste with [facility] is calling and needs a copy of pt immunization records with flu shot and pneumonia vaccine fax to [phone] attn celeste. Pt has an appt this afternoon

## 2019-03-20 NOTE — Telephone Encounter (Signed)
I called and spoke with [facility] and advised her to fax over a ROI request.

## 2019-03-22 DIAGNOSIS — Z992 Dependence on renal dialysis: Secondary | ICD-10-CM | POA: Diagnosis not present

## 2019-03-22 DIAGNOSIS — N186 End stage renal disease: Secondary | ICD-10-CM | POA: Diagnosis not present

## 2019-03-25 DIAGNOSIS — N186 End stage renal disease: Secondary | ICD-10-CM | POA: Diagnosis not present

## 2019-03-25 DIAGNOSIS — Z992 Dependence on renal dialysis: Secondary | ICD-10-CM | POA: Diagnosis not present

## 2019-03-27 DIAGNOSIS — Z992 Dependence on renal dialysis: Secondary | ICD-10-CM | POA: Diagnosis not present

## 2019-03-27 DIAGNOSIS — N186 End stage renal disease: Secondary | ICD-10-CM | POA: Diagnosis not present

## 2019-03-29 DIAGNOSIS — N186 End stage renal disease: Secondary | ICD-10-CM | POA: Diagnosis not present

## 2019-03-29 DIAGNOSIS — Z992 Dependence on renal dialysis: Secondary | ICD-10-CM | POA: Diagnosis not present

## 2019-04-01 DIAGNOSIS — N186 End stage renal disease: Secondary | ICD-10-CM | POA: Diagnosis not present

## 2019-04-01 DIAGNOSIS — N2581 Secondary hyperparathyroidism of renal origin: Secondary | ICD-10-CM | POA: Diagnosis not present

## 2019-04-01 DIAGNOSIS — Z992 Dependence on renal dialysis: Secondary | ICD-10-CM | POA: Diagnosis not present

## 2019-04-03 DIAGNOSIS — N186 End stage renal disease: Secondary | ICD-10-CM | POA: Diagnosis not present

## 2019-04-03 DIAGNOSIS — Z992 Dependence on renal dialysis: Secondary | ICD-10-CM | POA: Diagnosis not present

## 2019-04-05 DIAGNOSIS — Z992 Dependence on renal dialysis: Secondary | ICD-10-CM | POA: Diagnosis not present

## 2019-04-05 DIAGNOSIS — N186 End stage renal disease: Secondary | ICD-10-CM | POA: Diagnosis not present

## 2019-04-08 DIAGNOSIS — Z992 Dependence on renal dialysis: Secondary | ICD-10-CM | POA: Diagnosis not present

## 2019-04-08 DIAGNOSIS — N186 End stage renal disease: Secondary | ICD-10-CM | POA: Diagnosis not present

## 2019-04-10 DIAGNOSIS — Z992 Dependence on renal dialysis: Secondary | ICD-10-CM | POA: Diagnosis not present

## 2019-04-10 DIAGNOSIS — N186 End stage renal disease: Secondary | ICD-10-CM | POA: Diagnosis not present

## 2019-04-13 DIAGNOSIS — Z992 Dependence on renal dialysis: Secondary | ICD-10-CM | POA: Diagnosis not present

## 2019-04-13 DIAGNOSIS — N186 End stage renal disease: Secondary | ICD-10-CM | POA: Diagnosis not present

## 2019-04-16 DIAGNOSIS — N186 End stage renal disease: Secondary | ICD-10-CM | POA: Diagnosis not present

## 2019-04-16 DIAGNOSIS — Z992 Dependence on renal dialysis: Secondary | ICD-10-CM | POA: Diagnosis not present

## 2019-04-17 ENCOUNTER — Other Ambulatory Visit: Payer: Self-pay | Admitting: Student

## 2019-04-17 DIAGNOSIS — N186 End stage renal disease: Secondary | ICD-10-CM | POA: Diagnosis not present

## 2019-04-17 DIAGNOSIS — Z992 Dependence on renal dialysis: Secondary | ICD-10-CM | POA: Diagnosis not present

## 2019-04-17 DIAGNOSIS — K7460 Unspecified cirrhosis of liver: Secondary | ICD-10-CM

## 2019-04-18 ENCOUNTER — Other Ambulatory Visit: Payer: Self-pay

## 2019-04-18 ENCOUNTER — Ambulatory Visit
Admission: RE | Admit: 2019-04-18 | Discharge: 2019-04-18 | Disposition: A | Discharge status: Home / Self Care | Payer: Medicare Other | Source: Ambulatory Visit | Attending: Student | Admitting: Student

## 2019-04-18 ENCOUNTER — Other Ambulatory Visit
Admission: RE | Admit: 2019-04-18 | Discharge: 2019-04-18 | Disposition: A | Discharge status: Home / Self Care | Payer: Medicare Other | Source: Ambulatory Visit | Attending: Family Medicine | Admitting: Family Medicine

## 2019-04-18 DIAGNOSIS — R188 Other ascites: Secondary | ICD-10-CM | POA: Diagnosis not present

## 2019-04-18 DIAGNOSIS — K7460 Unspecified cirrhosis of liver: Secondary | ICD-10-CM | POA: Diagnosis not present

## 2019-04-18 DIAGNOSIS — Z992 Dependence on renal dialysis: Secondary | ICD-10-CM | POA: Diagnosis not present

## 2019-04-18 DIAGNOSIS — N186 End stage renal disease: Secondary | ICD-10-CM | POA: Diagnosis not present

## 2019-04-18 LAB — BODY FLUID CELL COUNT WITH DIFFERENTIAL
Eos, Fluid: 0 %
Lymphs, Fluid: 40 %
Monocyte-Macrophage-Serous Fluid: 55 %
Neutrophil Count, Fluid: 5 %
Total Nucleated Cell Count, Fluid: 235 cu mm

## 2019-04-18 LAB — ALBUMIN, PLEURAL OR PERITONEAL FLUID: Albumin, Fluid: 2 g/dL

## 2019-04-18 LAB — SODIUM: Sodium: 135 mmol/L (ref 135–145)

## 2019-04-18 LAB — ALBUMIN: Albumin: 3.3 g/dL — ABNORMAL LOW (ref 3.5–5.0)

## 2019-04-18 MED ORDER — ALBUMIN HUMAN 25 % IV SOLN: 25.0000 g | Freq: Once | INTRAVENOUS | Status: DC

## 2019-04-18 MED ORDER — ALBUMIN HUMAN 25 % IV SOLN
INTRAVENOUS | Status: AC
  Filled 2019-04-18: qty 200

## 2019-04-19 DIAGNOSIS — N186 End stage renal disease: Secondary | ICD-10-CM | POA: Diagnosis not present

## 2019-04-19 DIAGNOSIS — Z992 Dependence on renal dialysis: Secondary | ICD-10-CM | POA: Diagnosis not present

## 2019-04-22 DIAGNOSIS — Z992 Dependence on renal dialysis: Secondary | ICD-10-CM | POA: Diagnosis not present

## 2019-04-22 DIAGNOSIS — N186 End stage renal disease: Secondary | ICD-10-CM | POA: Diagnosis not present

## 2019-04-22 LAB — BODY FLUID CULTURE
Culture: NO GROWTH
Gram Stain: NONE SEEN

## 2019-04-23 LAB — CYTOLOGY - NON PAP

## 2019-04-24 DIAGNOSIS — Z992 Dependence on renal dialysis: Secondary | ICD-10-CM | POA: Diagnosis not present

## 2019-04-24 DIAGNOSIS — N186 End stage renal disease: Secondary | ICD-10-CM | POA: Diagnosis not present

## 2019-04-25 ENCOUNTER — Other Ambulatory Visit: Payer: Self-pay | Admitting: Family Medicine

## 2019-04-25 DIAGNOSIS — K2210 Ulcer of esophagus without bleeding: Secondary | ICD-10-CM

## 2019-04-26 DIAGNOSIS — N186 End stage renal disease: Secondary | ICD-10-CM | POA: Diagnosis not present

## 2019-04-26 DIAGNOSIS — Z992 Dependence on renal dialysis: Secondary | ICD-10-CM | POA: Diagnosis not present

## 2019-04-26 DIAGNOSIS — E1165 Type 2 diabetes mellitus with hyperglycemia: Secondary | ICD-10-CM | POA: Diagnosis not present

## 2019-04-26 DIAGNOSIS — R0689 Other abnormalities of breathing: Secondary | ICD-10-CM | POA: Diagnosis not present

## 2019-04-26 DIAGNOSIS — R404 Transient alteration of awareness: Secondary | ICD-10-CM | POA: Diagnosis not present

## 2019-04-26 DIAGNOSIS — Z743 Need for continuous supervision: Secondary | ICD-10-CM | POA: Diagnosis not present

## 2019-04-27 DIAGNOSIS — N186 End stage renal disease: Secondary | ICD-10-CM | POA: Diagnosis not present

## 2019-04-27 DIAGNOSIS — Z992 Dependence on renal dialysis: Secondary | ICD-10-CM | POA: Diagnosis not present

## 2019-04-29 ENCOUNTER — Telehealth: Payer: Self-pay | Admitting: Family Medicine

## 2019-04-29 NOTE — Telephone Encounter (Signed)
Wife is calling to report that patient passed away.

## 2019-05-20 DIAGNOSIS — 419620001 Death: Secondary | SNOMED CT | POA: Diagnosis not present

## 2019-05-20 DEATH — deceased

## 2019-06-25 ENCOUNTER — Ambulatory Visit: Payer: Self-pay

## 2019-06-25 ENCOUNTER — Ambulatory Visit (INDEPENDENT_AMBULATORY_CARE_PROVIDER_SITE_OTHER): Payer: Medicare Other | Admitting: Vascular Surgery

## 2019-06-25 ENCOUNTER — Encounter (INDEPENDENT_AMBULATORY_CARE_PROVIDER_SITE_OTHER): Payer: Medicare Other

## 2019-07-02 ENCOUNTER — Ambulatory Visit: Payer: Self-pay | Admitting: Family Medicine

## 2019-11-27 IMAGING — CR DG CHEST 2V
2 series · 2 of 2 positions shown · non-contrast
Comparison: May 24, 2017

CLINICAL DATA: Shortness of breath.

EXAM:
CHEST  2 VIEW

[chest lat]
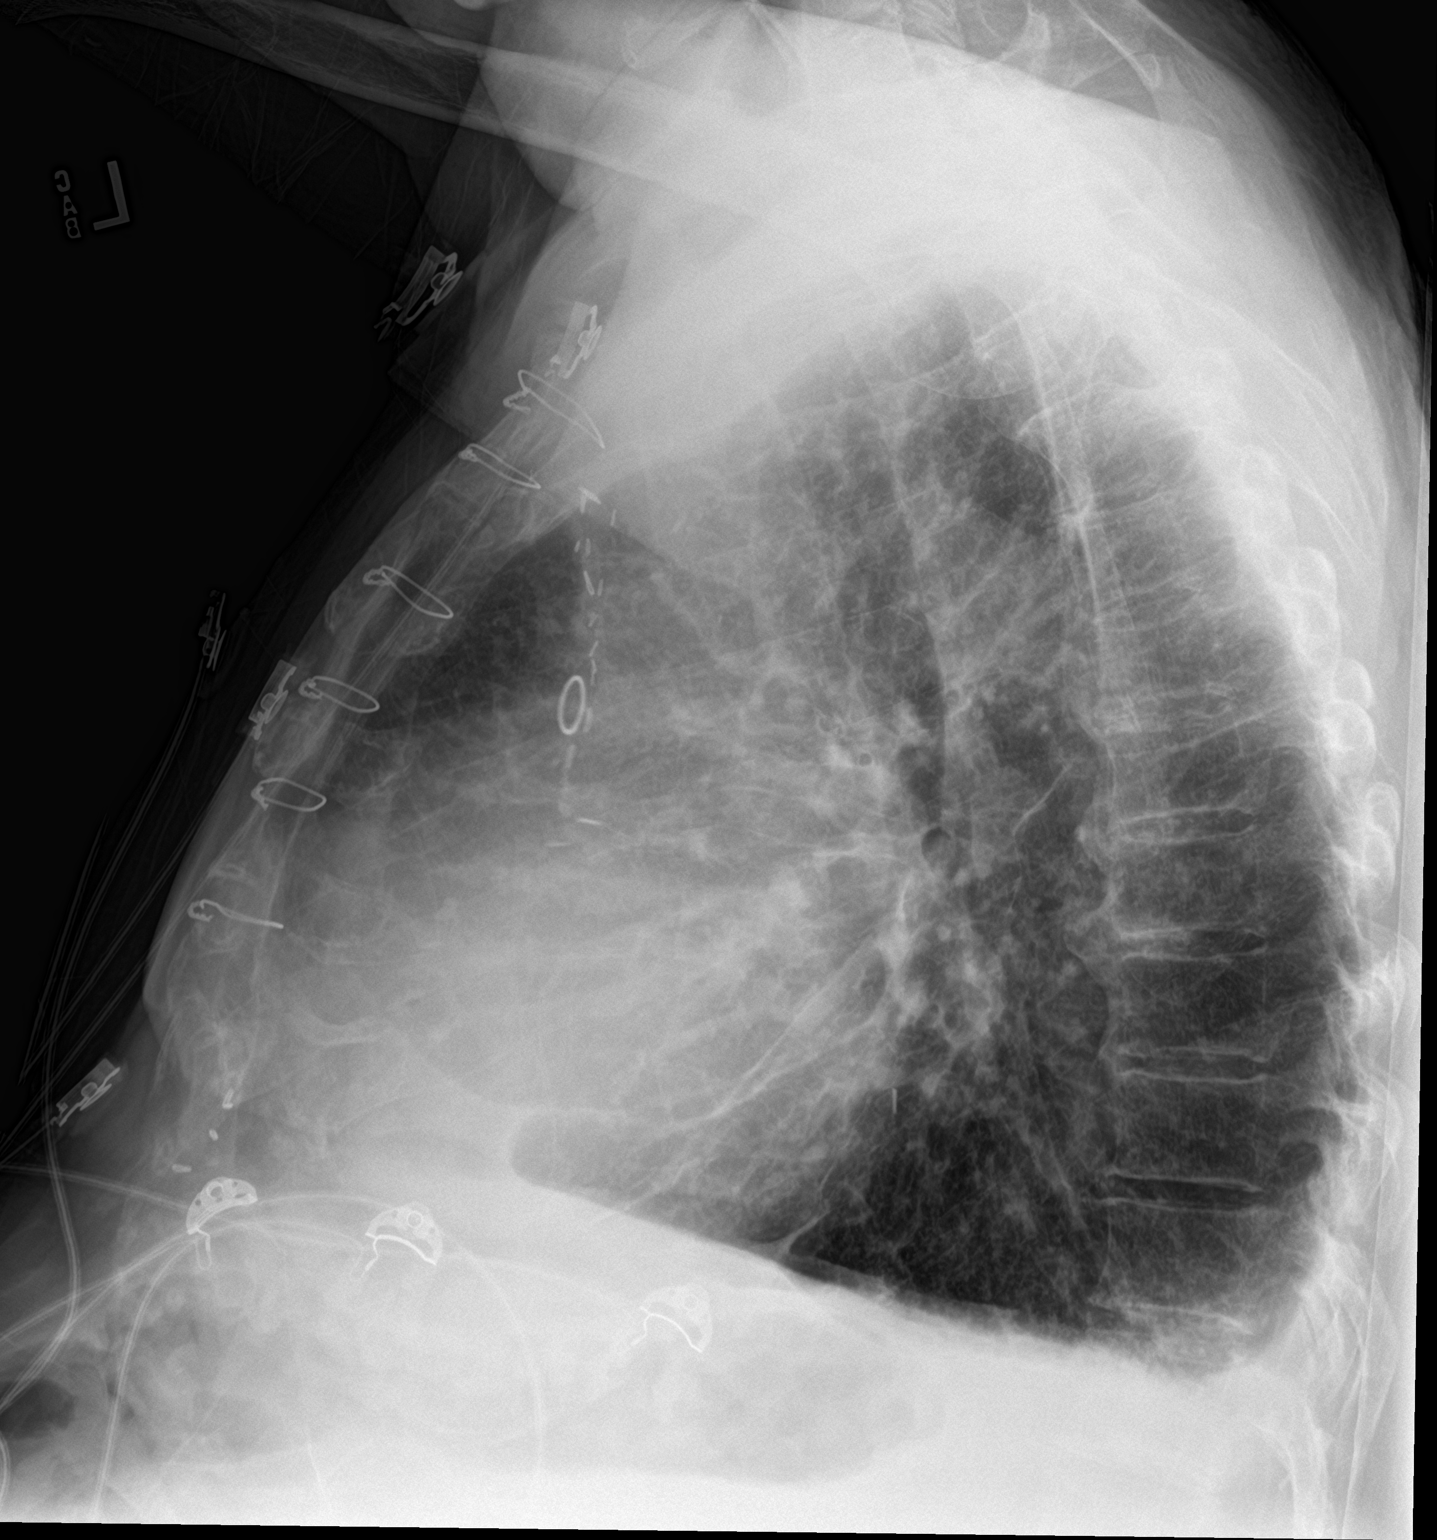

[chest ap]
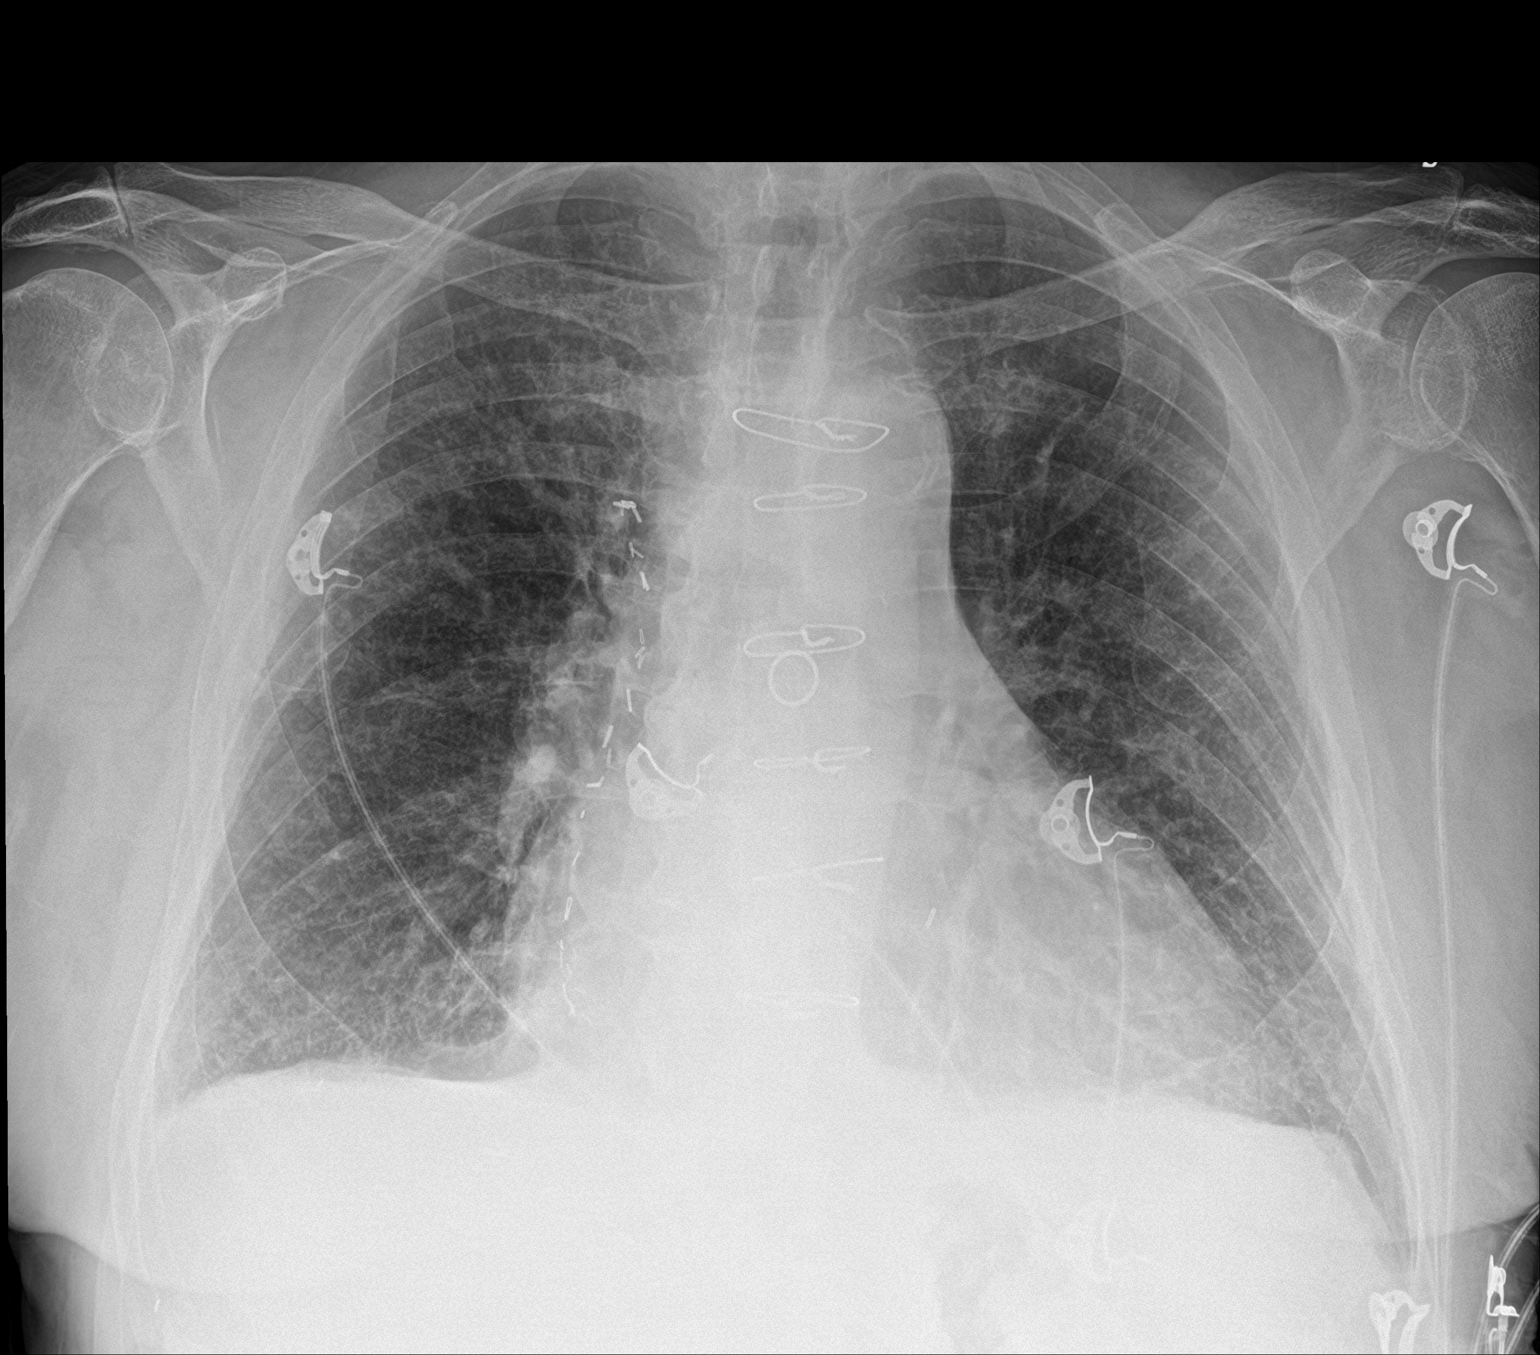

[2 of 2 positions shown; findings below may reference images not displayed]

FINDINGS: Cardiomegaly. Mild pulmonary venous congestion/edema. Tiny
effusions. No other acute abnormalities.
IMPRESSION: Cardiomegaly and mild edema.  Tiny effusions.

## 2021-04-17 IMAGING — CT CT CHEST LUNG CANCER SCREENING LOW DOSE
2 of 5 series · 15 of 40 positions shown, 18 images · non-contrast
Comparison: CTA chest 03/17/2018. Lung cancer screening CT
07/10/2017.

CLINICAL DATA: 72-year-old male with 38 pack-year history of
smoking. Lung cancer screening.

EXAM:
CT CHEST WITHOUT CONTRAST LOW-DOSE FOR LUNG CANCER SCREENING
TECHNIQUE: Multidetector CT imaging of the chest was performed following the
standard protocol without IV contrast.

[Series 3: thins lung · axial · 0.72mm/px · z∈[-1222,-895]mm · 12 of 361 slices shown, 15 images]
[im 17/361  mediastinal]
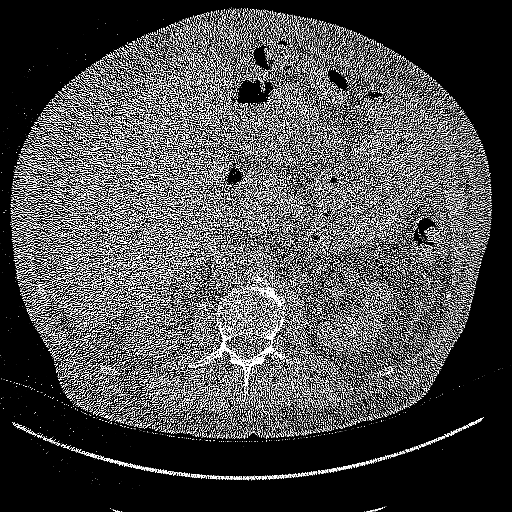
[im 17/361  lung]
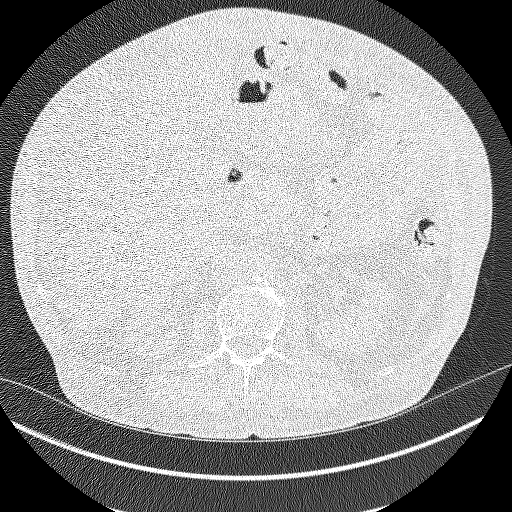
[im 50/361  lung]
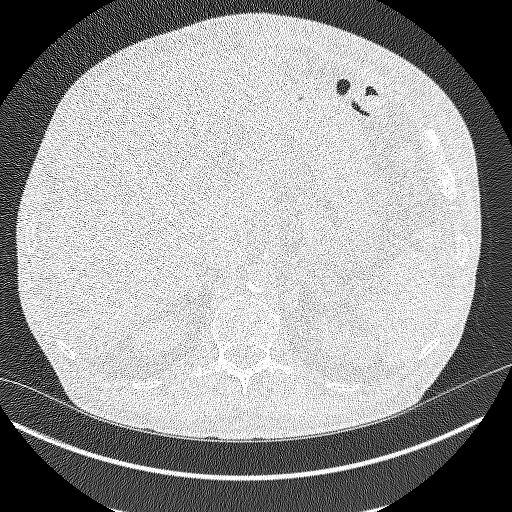
[im 82/361  lung]
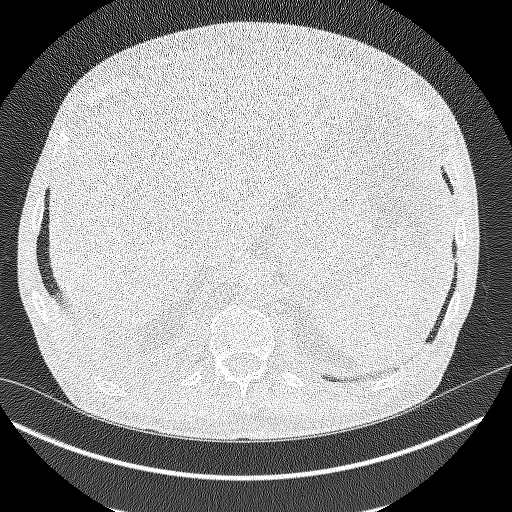
[im 115/361  lung]
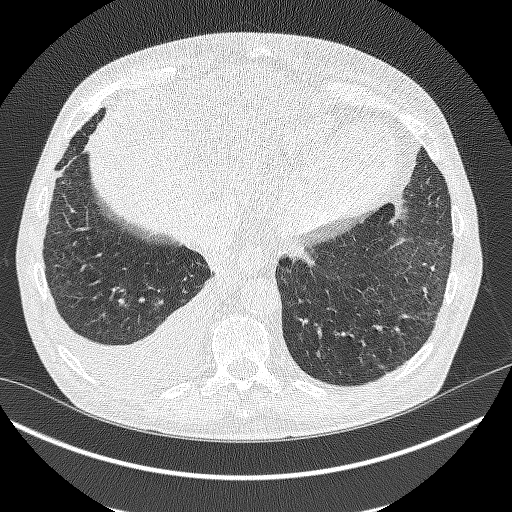
[im 131/361  mediastinal]
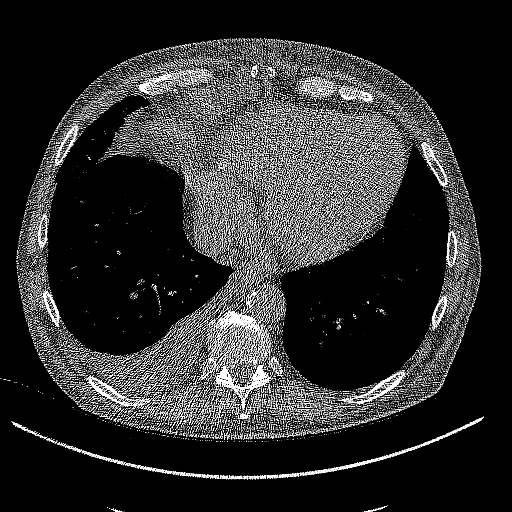
[im 131/361  lung]
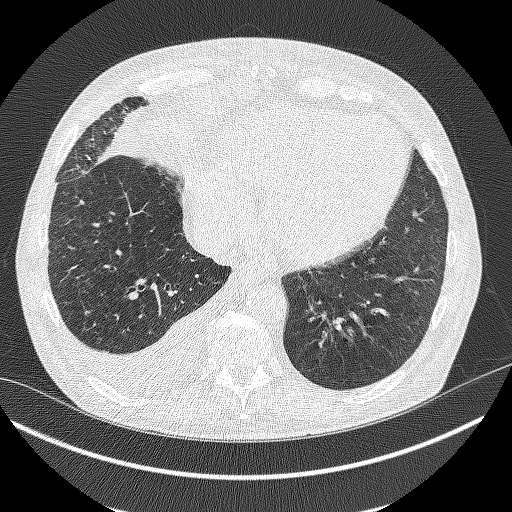
[im 164/361  lung]
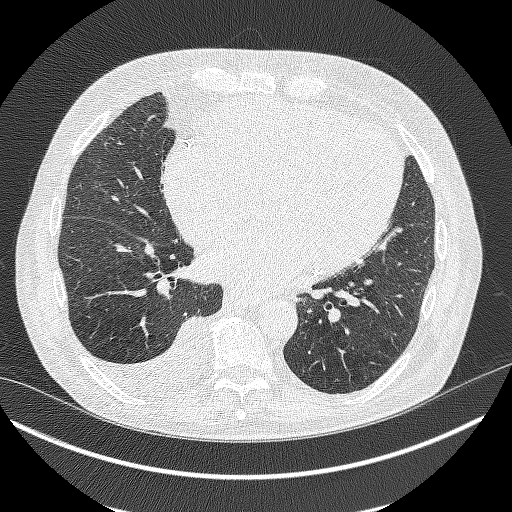
[im 197/361  lung]
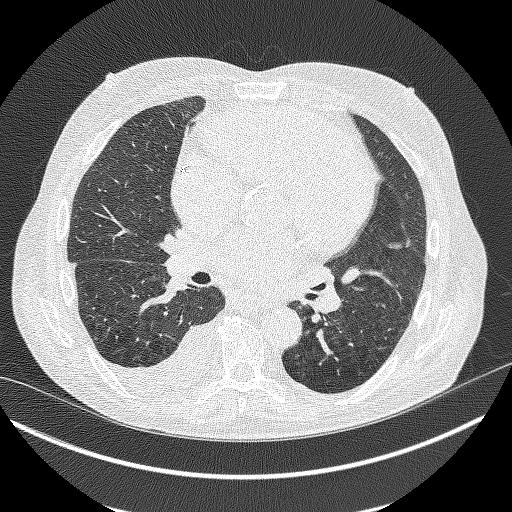
[im 230/361  lung]
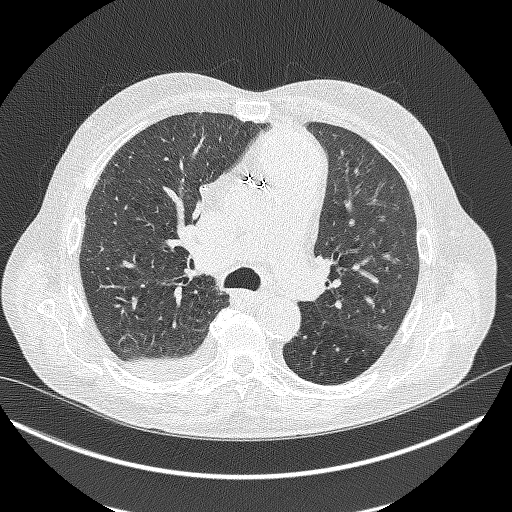
[im 246/361  mediastinal]
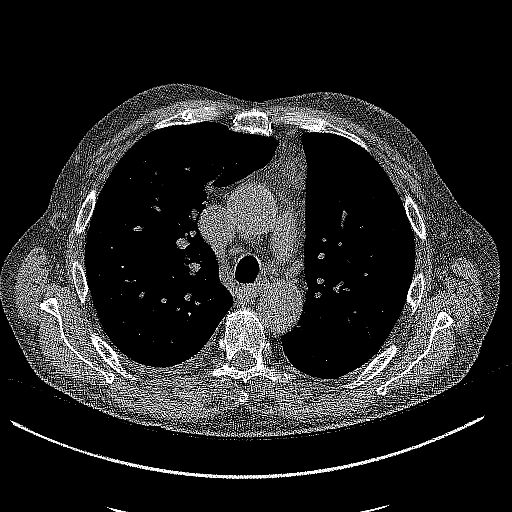
[im 246/361  lung]
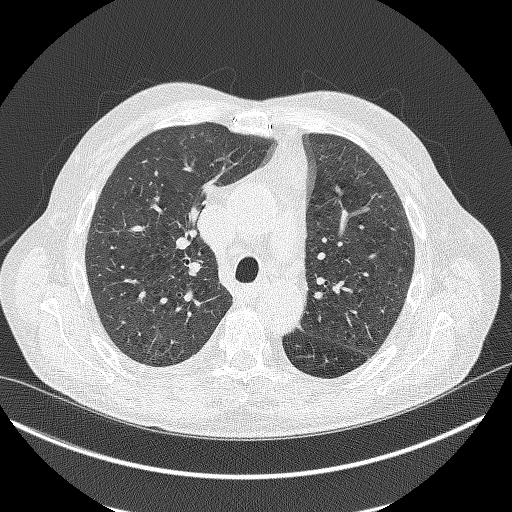
[im 279/361  lung]
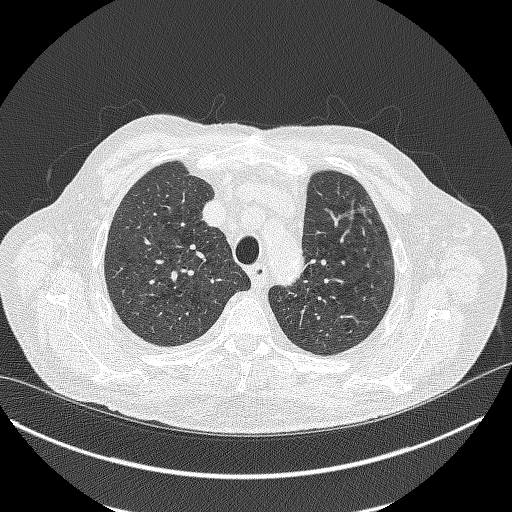
[im 311/361  lung]
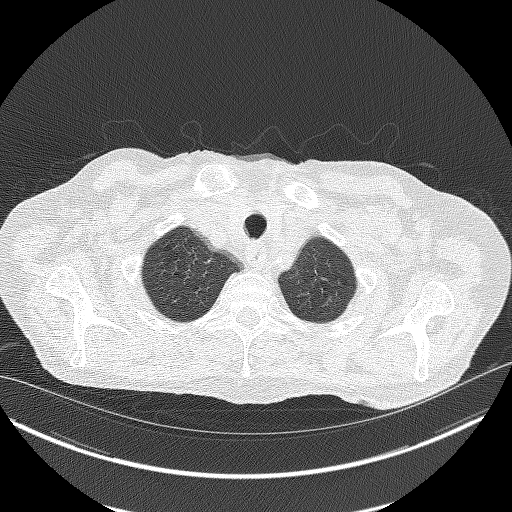
[im 344/361  lung]
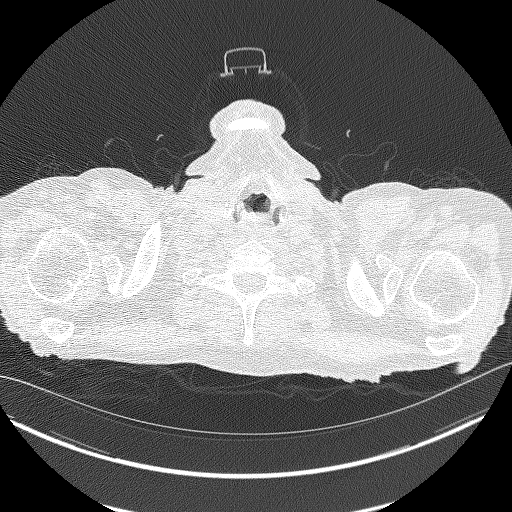

[Series 4: coronal lung · coronal · 0.71mm/px · 3 of 313 slices shown]
[im 63/313  lung]
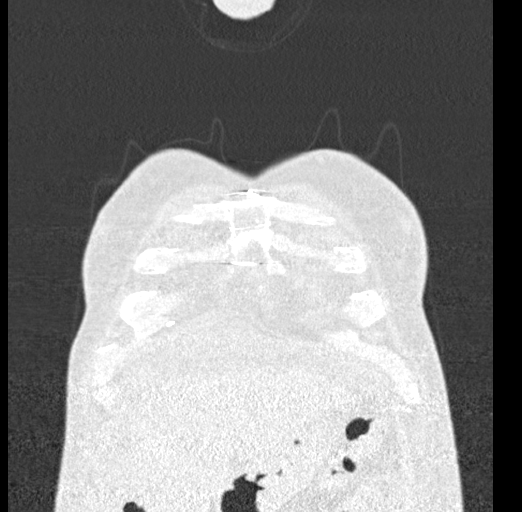
[im 125/313  lung]
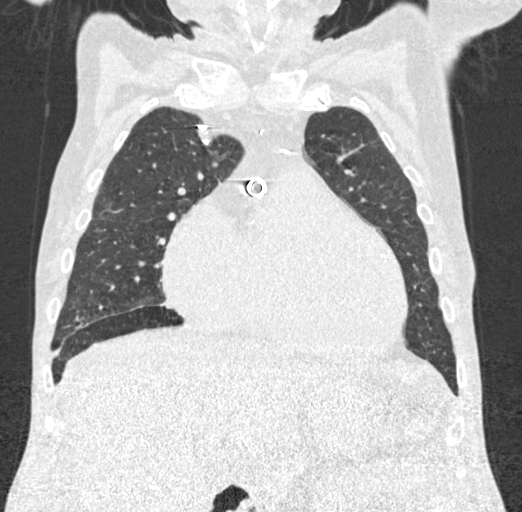
[im 188/313  lung]
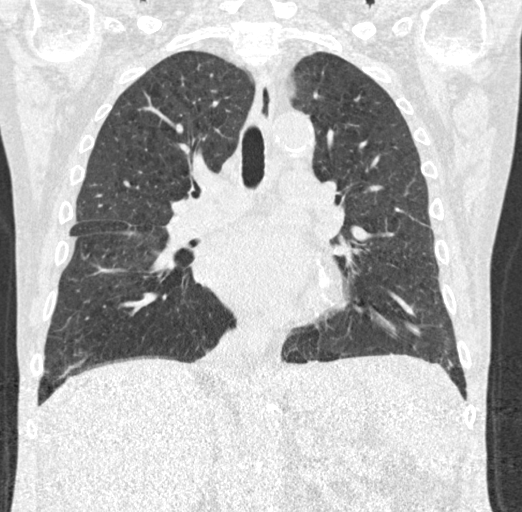

[15 of 40 positions shown; findings below may reference images not displayed]

FINDINGS: Cardiovascular: Heart is enlarged. No pericardial effusion. Coronary
artery calcification is evident. Status post CABG. Atherosclerotic
calcification is noted in the wall of the thoracic aorta.

Mediastinum/Nodes: Scattered small mediastinal lymph nodes evident.
No evidence for gross hilar lymphadenopathy although assessment is
limited by the lack of intravenous contrast on today's study. The
esophagus has normal imaging features. There is no axillary
lymphadenopathy.

Lungs/Pleura: Centrilobular emphsyema noted. Multiple new pulmonary
nodules are identified in the lungs bilaterally, measuring up to
mm maximum diameter. Similar right pleural effusion compared to
03/17/2018 with decrease in left pleural fluid since that study.

Upper Abdomen: Irregular liver contour raises the question of
cirrhosis. Ascites noted in the visualized upper abdomen.
Incompletely visualized ventral hernia contains fat and fluid.

Musculoskeletal: No worrisome lytic or sclerotic osseous
abnormality.
IMPRESSION: 1. Multiple new scattered tiny bilateral pulmonary nodules,
measuring up to 5.4 mm. Lung-RADS 3, probably benign findings, but
correlation for primary cancer history recommended. Short-term
follow-up in 6 months is recommended with repeat low-dose chest CT
without contrast (please use the following order, "CT CHEST LCS
NODULE FOLLOW-UP W/O CM").
2. Irregular liver contour raises the question of cirrhosis.
3. Ascites.
4. Small right pleural effusion.
5.  Emphysema. (V5122-GKH.O)
6.  Aortic Atherosclerois (V5122-170.0)
